# Patient Record
Sex: Male | Born: 1935 | Race: White | Hispanic: No | Smoking: Former smoker
Health system: Southern US, Community
[De-identification: ages and names within clinical notes are randomized; demographics above are authoritative.]

## PROBLEM LIST (undated history)

## (undated) DIAGNOSIS — K7589 Other specified inflammatory liver diseases: Secondary | ICD-10-CM

## (undated) DIAGNOSIS — J449 Chronic obstructive pulmonary disease, unspecified: Secondary | ICD-10-CM

## (undated) DIAGNOSIS — Z860100 Personal history of colon polyps, unspecified: Secondary | ICD-10-CM

## (undated) DIAGNOSIS — E119 Type 2 diabetes mellitus without complications: Secondary | ICD-10-CM

## (undated) DIAGNOSIS — E785 Hyperlipidemia, unspecified: Secondary | ICD-10-CM

## (undated) DIAGNOSIS — I10 Essential (primary) hypertension: Secondary | ICD-10-CM

## (undated) DIAGNOSIS — R55 Syncope and collapse: Secondary | ICD-10-CM

## (undated) DIAGNOSIS — N529 Male erectile dysfunction, unspecified: Secondary | ICD-10-CM

## (undated) DIAGNOSIS — C61 Malignant neoplasm of prostate: Secondary | ICD-10-CM

## (undated) DIAGNOSIS — N2889 Other specified disorders of kidney and ureter: Secondary | ICD-10-CM

## (undated) HISTORY — DX: Other specified disorders of kidney and ureter: N28.89

## (undated) HISTORY — DX: Malignant neoplasm of prostate: C61

## (undated) HISTORY — DX: Other specified inflammatory liver diseases: K75.89

## (undated) HISTORY — DX: Chronic obstructive pulmonary disease, unspecified: J44.9

## (undated) HISTORY — DX: Male erectile dysfunction, unspecified: N52.9

## (undated) HISTORY — DX: Hyperlipidemia, unspecified: E78.5

## (undated) HISTORY — PX: CATARACT EXTRACTION: SUR2

## (undated) HISTORY — DX: Syncope and collapse: R55

---

## 2000-04-10 ENCOUNTER — Emergency Department (HOSPITAL_COMMUNITY): Admission: EM | Admit: 2000-04-10 | Discharge: 2000-04-10 | Payer: Self-pay | Admitting: Emergency Medicine

## 2003-08-02 ENCOUNTER — Emergency Department (HOSPITAL_COMMUNITY): Admission: EM | Admit: 2003-08-02 | Discharge: 2003-08-03 | Payer: Self-pay

## 2003-08-02 ENCOUNTER — Encounter: Payer: Self-pay | Admitting: Emergency Medicine

## 2003-08-10 ENCOUNTER — Encounter: Payer: Self-pay | Admitting: Internal Medicine

## 2003-08-10 ENCOUNTER — Encounter: Admission: RE | Admit: 2003-08-10 | Discharge: 2003-08-10 | Payer: Self-pay | Admitting: Internal Medicine

## 2004-10-21 ENCOUNTER — Emergency Department (HOSPITAL_COMMUNITY): Admission: EM | Admit: 2004-10-21 | Discharge: 2004-10-21 | Payer: Self-pay | Admitting: Emergency Medicine

## 2005-09-26 ENCOUNTER — Emergency Department (HOSPITAL_COMMUNITY): Admission: EM | Admit: 2005-09-26 | Discharge: 2005-09-26 | Payer: Self-pay | Admitting: Emergency Medicine

## 2006-01-21 LAB — HM COLONOSCOPY: HM Colonoscopy: NORMAL

## 2006-05-11 ENCOUNTER — Emergency Department (HOSPITAL_COMMUNITY): Admission: EM | Admit: 2006-05-11 | Discharge: 2006-05-11 | Payer: Self-pay | Admitting: Emergency Medicine

## 2006-05-14 ENCOUNTER — Ambulatory Visit: Payer: Self-pay | Admitting: Internal Medicine

## 2006-07-15 ENCOUNTER — Ambulatory Visit: Payer: Self-pay | Admitting: Internal Medicine

## 2006-08-12 ENCOUNTER — Ambulatory Visit: Payer: Self-pay | Admitting: Internal Medicine

## 2006-08-18 ENCOUNTER — Emergency Department (HOSPITAL_COMMUNITY): Admission: EM | Admit: 2006-08-18 | Discharge: 2006-08-18 | Payer: Self-pay | Admitting: Emergency Medicine

## 2006-09-01 ENCOUNTER — Emergency Department (HOSPITAL_COMMUNITY): Admission: EM | Admit: 2006-09-01 | Discharge: 2006-09-01 | Payer: Self-pay | Admitting: Emergency Medicine

## 2006-09-07 ENCOUNTER — Ambulatory Visit: Payer: Self-pay | Admitting: Internal Medicine

## 2006-09-10 ENCOUNTER — Emergency Department (HOSPITAL_COMMUNITY): Admission: EM | Admit: 2006-09-10 | Discharge: 2006-09-11 | Payer: Self-pay | Admitting: Emergency Medicine

## 2006-09-14 ENCOUNTER — Ambulatory Visit: Payer: Self-pay | Admitting: Internal Medicine

## 2006-10-05 ENCOUNTER — Encounter: Payer: Self-pay | Admitting: Emergency Medicine

## 2006-10-07 ENCOUNTER — Ambulatory Visit: Payer: Self-pay | Admitting: Internal Medicine

## 2006-10-07 ENCOUNTER — Inpatient Hospital Stay (HOSPITAL_COMMUNITY): Admission: EM | Admit: 2006-10-07 | Discharge: 2006-10-08 | Payer: Self-pay | Admitting: Internal Medicine

## 2006-10-13 ENCOUNTER — Ambulatory Visit: Payer: Self-pay | Admitting: Internal Medicine

## 2006-11-24 DIAGNOSIS — C61 Malignant neoplasm of prostate: Secondary | ICD-10-CM

## 2006-11-24 HISTORY — DX: Malignant neoplasm of prostate: C61

## 2006-12-11 ENCOUNTER — Ambulatory Visit: Payer: Self-pay | Admitting: Internal Medicine

## 2006-12-17 ENCOUNTER — Ambulatory Visit: Payer: Self-pay | Admitting: Internal Medicine

## 2006-12-17 DIAGNOSIS — I10 Essential (primary) hypertension: Secondary | ICD-10-CM

## 2006-12-17 DIAGNOSIS — E785 Hyperlipidemia, unspecified: Secondary | ICD-10-CM

## 2006-12-29 ENCOUNTER — Ambulatory Visit: Payer: Self-pay | Admitting: Internal Medicine

## 2007-01-12 ENCOUNTER — Ambulatory Visit: Payer: Self-pay | Admitting: Internal Medicine

## 2007-01-27 ENCOUNTER — Ambulatory Visit: Payer: Self-pay | Admitting: Internal Medicine

## 2007-01-27 LAB — CONVERTED CEMR LAB
BUN: 9 mg/dL (ref 6–23)
CO2: 29 meq/L (ref 19–32)
Calcium: 9.1 mg/dL (ref 8.4–10.5)
Chloride: 106 meq/L (ref 96–112)
Creatinine, Ser: 0.6 mg/dL (ref 0.4–1.5)
GFR calc Af Amer: 170 mL/min
GFR calc non Af Amer: 141 mL/min
Glucose, Bld: 158 mg/dL — ABNORMAL HIGH (ref 70–99)
Potassium: 4 meq/L (ref 3.5–5.1)
Sodium: 142 meq/L (ref 135–145)

## 2007-03-29 ENCOUNTER — Encounter: Payer: Self-pay | Admitting: Internal Medicine

## 2007-03-29 ENCOUNTER — Ambulatory Visit: Payer: Self-pay | Admitting: Internal Medicine

## 2007-03-29 LAB — CONVERTED CEMR LAB
Bilirubin Urine: NEGATIVE
Hemoglobin, Urine: NEGATIVE
Ketones, ur: NEGATIVE mg/dL
Leukocytes, UA: NEGATIVE
Nitrite: NEGATIVE
PSA: 7.13 ng/mL — ABNORMAL HIGH (ref 0.10–4.00)
Protein, ur: NEGATIVE mg/dL
RBC / HPF: NONE SEEN (ref <3)
Specific Gravity, Urine: 1.01 (ref 1.005–1.03)
Urine Glucose: NEGATIVE mg/dL
Urobilinogen, UA: 0.2 (ref 0.0–1.0)
WBC, UA: NONE SEEN cells/hpf (ref <3)
pH: 7 (ref 5.0–8.0)

## 2007-04-21 ENCOUNTER — Emergency Department (HOSPITAL_COMMUNITY): Admission: EM | Admit: 2007-04-21 | Discharge: 2007-04-21 | Payer: Self-pay | Admitting: *Deleted

## 2007-04-26 ENCOUNTER — Ambulatory Visit: Payer: Self-pay | Admitting: Internal Medicine

## 2007-04-27 ENCOUNTER — Encounter: Payer: Self-pay | Admitting: Internal Medicine

## 2007-04-28 LAB — CONVERTED CEMR LAB
Hemoglobin, Urine: NEGATIVE
Ketones, ur: NEGATIVE mg/dL
Leukocytes, UA: NEGATIVE
Nitrite: NEGATIVE
Protein, ur: NEGATIVE mg/dL
RBC / HPF: NONE SEEN (ref <3)
Specific Gravity, Urine: 1.027 (ref 1.005–1.03)
Urine Glucose: NEGATIVE mg/dL
Urobilinogen, UA: 1 (ref 0.0–1.0)
WBC, UA: NONE SEEN cells/hpf (ref <3)
pH: 6 (ref 5.0–8.0)

## 2007-05-03 LAB — CONVERTED CEMR LAB: PSA: 6.4 ng/mL — ABNORMAL HIGH (ref 0.10–4.00)

## 2007-05-19 ENCOUNTER — Ambulatory Visit: Payer: Self-pay | Admitting: Internal Medicine

## 2007-05-20 ENCOUNTER — Encounter: Payer: Self-pay | Admitting: Internal Medicine

## 2007-05-20 LAB — CONVERTED CEMR LAB
Bilirubin Urine: NEGATIVE
Hemoglobin, Urine: NEGATIVE
Ketones, ur: NEGATIVE mg/dL
Leukocytes, UA: NEGATIVE
Nitrite: NEGATIVE
Protein, ur: NEGATIVE mg/dL
Specific Gravity, Urine: 1.025 (ref 1.005–1.03)
Urine Glucose: NEGATIVE mg/dL
Urobilinogen, UA: 0.2 (ref 0.0–1.0)
pH: 5.5 (ref 5.0–8.0)

## 2007-05-24 ENCOUNTER — Encounter (INDEPENDENT_AMBULATORY_CARE_PROVIDER_SITE_OTHER): Payer: Self-pay | Admitting: *Deleted

## 2007-08-04 ENCOUNTER — Ambulatory Visit: Payer: Self-pay | Admitting: Internal Medicine

## 2007-08-04 LAB — CONVERTED CEMR LAB
Bilirubin Urine: NEGATIVE
Glucose, Urine, Semiquant: NEGATIVE
Protein, U semiquant: NEGATIVE
Specific Gravity, Urine: 1.01
pH: 6.5

## 2007-08-05 LAB — CONVERTED CEMR LAB
ALT: 18 units/L (ref 0–53)
AST: 20 units/L (ref 0–37)
BUN: 13 mg/dL (ref 6–23)
CO2: 30 meq/L (ref 19–32)
Calcium: 9.3 mg/dL (ref 8.4–10.5)
Chloride: 109 meq/L (ref 96–112)
Cholesterol: 146 mg/dL (ref 0–200)
Creatinine, Ser: 1 mg/dL (ref 0.4–1.5)
GFR calc Af Amer: 94 mL/min
GFR calc non Af Amer: 78 mL/min
Glucose, Bld: 94 mg/dL (ref 70–99)
HDL: 34.1 mg/dL — ABNORMAL LOW (ref >39.0)
Hemoglobin: 14.7 g/dL (ref 13.0–17.0)
LDL Cholesterol: 87 mg/dL (ref 0–99)
PSA: 6.28 ng/mL — ABNORMAL HIGH (ref 0.10–4.00)
Potassium: 4.5 meq/L (ref 3.5–5.1)
Sodium: 145 meq/L (ref 135–145)
TSH: 0.55 microintl units/mL (ref 0.35–5.50)
Total CHOL/HDL Ratio: 4.3
Triglycerides: 125 mg/dL (ref 0–149)
VLDL: 25 mg/dL (ref 0–40)

## 2007-08-26 ENCOUNTER — Encounter: Payer: Self-pay | Admitting: Internal Medicine

## 2007-09-04 ENCOUNTER — Encounter: Payer: Self-pay | Admitting: Internal Medicine

## 2007-09-04 ENCOUNTER — Inpatient Hospital Stay (HOSPITAL_COMMUNITY): Admission: EM | Admit: 2007-09-04 | Discharge: 2007-09-07 | Payer: Self-pay | Admitting: Emergency Medicine

## 2007-09-04 ENCOUNTER — Ambulatory Visit: Payer: Self-pay | Admitting: Internal Medicine

## 2007-09-17 ENCOUNTER — Ambulatory Visit: Payer: Self-pay | Admitting: Internal Medicine

## 2007-09-17 DIAGNOSIS — F528 Other sexual dysfunction not due to a substance or known physiological condition: Secondary | ICD-10-CM

## 2007-09-18 ENCOUNTER — Encounter (INDEPENDENT_AMBULATORY_CARE_PROVIDER_SITE_OTHER): Payer: Self-pay | Admitting: *Deleted

## 2007-09-18 LAB — CONVERTED CEMR LAB
BUN: 10 mg/dL (ref 6–23)
CO2: 28 meq/L (ref 19–32)
Calcium: 8.9 mg/dL (ref 8.4–10.5)
Chloride: 106 meq/L (ref 96–112)
Creatinine, Ser: 1 mg/dL (ref 0.4–1.5)
GFR calc Af Amer: 94 mL/min
GFR calc non Af Amer: 78 mL/min
Glucose, Bld: 108 mg/dL — ABNORMAL HIGH (ref 70–99)
Potassium: 4.1 meq/L (ref 3.5–5.1)
Sodium: 138 meq/L (ref 135–145)

## 2007-09-23 ENCOUNTER — Ambulatory Visit: Payer: Self-pay | Admitting: Endocrinology

## 2007-09-23 ENCOUNTER — Inpatient Hospital Stay (HOSPITAL_COMMUNITY): Admission: EM | Admit: 2007-09-23 | Discharge: 2007-09-26 | Payer: Self-pay | Admitting: Emergency Medicine

## 2007-10-02 ENCOUNTER — Inpatient Hospital Stay (HOSPITAL_COMMUNITY): Admission: EM | Admit: 2007-10-02 | Discharge: 2007-10-04 | Payer: Self-pay | Admitting: Emergency Medicine

## 2007-10-12 ENCOUNTER — Ambulatory Visit: Payer: Self-pay | Admitting: Internal Medicine

## 2007-10-13 ENCOUNTER — Encounter: Payer: Self-pay | Admitting: Internal Medicine

## 2007-10-14 ENCOUNTER — Encounter: Payer: Self-pay | Admitting: Internal Medicine

## 2007-10-19 ENCOUNTER — Encounter: Payer: Self-pay | Admitting: Internal Medicine

## 2007-10-20 ENCOUNTER — Ambulatory Visit: Payer: Self-pay | Admitting: Internal Medicine

## 2007-10-27 ENCOUNTER — Ambulatory Visit: Payer: Self-pay | Admitting: Pulmonary Disease

## 2007-10-29 ENCOUNTER — Ambulatory Visit: Admission: RE | Admit: 2007-10-29 | Discharge: 2007-11-24 | Payer: Self-pay | Admitting: Radiation Oncology

## 2007-11-01 ENCOUNTER — Encounter: Payer: Self-pay | Admitting: Internal Medicine

## 2007-11-02 ENCOUNTER — Telehealth (INDEPENDENT_AMBULATORY_CARE_PROVIDER_SITE_OTHER): Payer: Self-pay | Admitting: *Deleted

## 2007-11-02 ENCOUNTER — Encounter: Payer: Self-pay | Admitting: Internal Medicine

## 2007-11-02 DIAGNOSIS — C61 Malignant neoplasm of prostate: Secondary | ICD-10-CM

## 2007-11-25 ENCOUNTER — Ambulatory Visit: Admission: RE | Admit: 2007-11-25 | Discharge: 2008-02-21 | Payer: Self-pay | Admitting: Radiation Oncology

## 2007-12-08 ENCOUNTER — Ambulatory Visit: Payer: Self-pay | Admitting: Pulmonary Disease

## 2007-12-08 DIAGNOSIS — J449 Chronic obstructive pulmonary disease, unspecified: Secondary | ICD-10-CM | POA: Insufficient documentation

## 2007-12-20 ENCOUNTER — Ambulatory Visit: Payer: Self-pay | Admitting: Internal Medicine

## 2008-02-03 ENCOUNTER — Ambulatory Visit: Payer: Self-pay | Admitting: Internal Medicine

## 2008-02-10 ENCOUNTER — Ambulatory Visit: Payer: Self-pay | Admitting: Internal Medicine

## 2008-02-16 ENCOUNTER — Emergency Department (HOSPITAL_COMMUNITY): Admission: EM | Admit: 2008-02-16 | Discharge: 2008-02-16 | Payer: Self-pay | Admitting: Emergency Medicine

## 2008-02-21 ENCOUNTER — Ambulatory Visit: Admission: RE | Admit: 2008-02-21 | Discharge: 2008-03-09 | Payer: Self-pay | Admitting: Radiation Oncology

## 2008-02-28 ENCOUNTER — Encounter: Payer: Self-pay | Admitting: Internal Medicine

## 2008-02-29 ENCOUNTER — Encounter (INDEPENDENT_AMBULATORY_CARE_PROVIDER_SITE_OTHER): Payer: Self-pay | Admitting: *Deleted

## 2008-03-02 ENCOUNTER — Ambulatory Visit: Payer: Self-pay | Admitting: Pulmonary Disease

## 2008-03-03 ENCOUNTER — Encounter: Payer: Self-pay | Admitting: Internal Medicine

## 2008-04-19 ENCOUNTER — Encounter: Payer: Self-pay | Admitting: Internal Medicine

## 2008-05-08 ENCOUNTER — Ambulatory Visit: Payer: Self-pay | Admitting: Internal Medicine

## 2008-05-09 LAB — CONVERTED CEMR LAB
ALT: 15 units/L (ref 0–53)
AST: 14 units/L (ref 0–37)
BUN: 11 mg/dL (ref 6–23)
Basophils Absolute: 0.1 10*3/uL (ref 0.0–0.1)
Basophils Relative: 0.6 % (ref 0.0–1.0)
CO2: 28 meq/L (ref 19–32)
Calcium: 9.1 mg/dL (ref 8.4–10.5)
Chloride: 105 meq/L (ref 96–112)
Creatinine, Ser: 1.1 mg/dL (ref 0.4–1.5)
Eosinophils Absolute: 0.8 10*3/uL — ABNORMAL HIGH (ref 0.0–0.7)
Eosinophils Relative: 9.9 % — ABNORMAL HIGH (ref 0.0–5.0)
GFR calc Af Amer: 84 mL/min
GFR calc non Af Amer: 70 mL/min
Glucose, Bld: 135 mg/dL — ABNORMAL HIGH (ref 70–99)
HCT: 37.9 % — ABNORMAL LOW (ref 39.0–52.0)
Hemoglobin: 13.3 g/dL (ref 13.0–17.0)
Lymphocytes Relative: 16 % (ref 12.0–46.0)
MCHC: 35.1 g/dL (ref 30.0–36.0)
MCV: 94.1 fL (ref 78.0–100.0)
Monocytes Absolute: 0.8 10*3/uL (ref 0.1–1.0)
Monocytes Relative: 10 % (ref 3.0–12.0)
Neutro Abs: 5.4 10*3/uL (ref 1.4–7.7)
Neutrophils Relative %: 63.5 % (ref 43.0–77.0)
Platelets: 219 10*3/uL (ref 150–400)
Potassium: 4.6 meq/L (ref 3.5–5.1)
RBC: 4.03 M/uL — ABNORMAL LOW (ref 4.22–5.81)
RDW: 13.1 % (ref 11.5–14.6)
Sodium: 140 meq/L (ref 135–145)
WBC: 8.4 10*3/uL (ref 4.5–10.5)

## 2008-05-11 ENCOUNTER — Telehealth (INDEPENDENT_AMBULATORY_CARE_PROVIDER_SITE_OTHER): Payer: Self-pay | Admitting: *Deleted

## 2008-06-07 ENCOUNTER — Ambulatory Visit: Payer: Self-pay | Admitting: Pulmonary Disease

## 2008-09-06 ENCOUNTER — Ambulatory Visit: Payer: Self-pay | Admitting: Internal Medicine

## 2008-09-07 ENCOUNTER — Telehealth: Payer: Self-pay | Admitting: Internal Medicine

## 2008-09-08 LAB — CONVERTED CEMR LAB
Total CHOL/HDL Ratio: 4.3
Triglycerides: 143 mg/dL (ref 0–149)

## 2008-10-06 ENCOUNTER — Encounter: Payer: Self-pay | Admitting: Internal Medicine

## 2008-10-06 ENCOUNTER — Telehealth (INDEPENDENT_AMBULATORY_CARE_PROVIDER_SITE_OTHER): Payer: Self-pay | Admitting: *Deleted

## 2008-10-25 ENCOUNTER — Encounter: Payer: Self-pay | Admitting: Internal Medicine

## 2008-11-23 ENCOUNTER — Encounter: Payer: Self-pay | Admitting: Internal Medicine

## 2008-11-24 DIAGNOSIS — R55 Syncope and collapse: Secondary | ICD-10-CM

## 2008-11-24 HISTORY — DX: Syncope and collapse: R55

## 2008-12-04 ENCOUNTER — Ambulatory Visit: Payer: Self-pay | Admitting: Pulmonary Disease

## 2009-02-28 ENCOUNTER — Ambulatory Visit: Payer: Self-pay | Admitting: Internal Medicine

## 2009-03-05 LAB — CONVERTED CEMR LAB
ALT: 17 units/L (ref 0–53)
AST: 18 units/L (ref 0–37)
CO2: 29 meq/L (ref 19–32)
Chloride: 108 meq/L (ref 96–112)
Potassium: 4.2 meq/L (ref 3.5–5.1)
Sodium: 143 meq/L (ref 135–145)

## 2009-05-03 ENCOUNTER — Encounter: Payer: Self-pay | Admitting: Internal Medicine

## 2009-05-23 ENCOUNTER — Encounter: Payer: Self-pay | Admitting: Internal Medicine

## 2009-05-31 ENCOUNTER — Ambulatory Visit: Payer: Self-pay | Admitting: Pulmonary Disease

## 2009-06-26 ENCOUNTER — Encounter: Payer: Self-pay | Admitting: Internal Medicine

## 2009-07-04 ENCOUNTER — Ambulatory Visit: Payer: Self-pay | Admitting: Internal Medicine

## 2009-07-13 ENCOUNTER — Ambulatory Visit: Payer: Self-pay | Admitting: Internal Medicine

## 2009-07-13 DIAGNOSIS — R55 Syncope and collapse: Secondary | ICD-10-CM | POA: Insufficient documentation

## 2009-07-16 ENCOUNTER — Encounter (INDEPENDENT_AMBULATORY_CARE_PROVIDER_SITE_OTHER): Payer: Self-pay | Admitting: *Deleted

## 2009-07-16 ENCOUNTER — Encounter: Payer: Self-pay | Admitting: Internal Medicine

## 2009-08-09 ENCOUNTER — Ambulatory Visit: Payer: Self-pay | Admitting: Internal Medicine

## 2009-08-13 ENCOUNTER — Ambulatory Visit: Payer: Self-pay

## 2009-08-13 ENCOUNTER — Encounter: Payer: Self-pay | Admitting: Internal Medicine

## 2009-09-20 ENCOUNTER — Encounter: Payer: Self-pay | Admitting: Internal Medicine

## 2009-09-26 ENCOUNTER — Ambulatory Visit: Payer: Self-pay | Admitting: Internal Medicine

## 2009-10-15 ENCOUNTER — Ambulatory Visit: Payer: Self-pay | Admitting: Internal Medicine

## 2009-10-17 ENCOUNTER — Ambulatory Visit: Payer: Self-pay | Admitting: Internal Medicine

## 2009-10-23 LAB — CONVERTED CEMR LAB
ALT: 15 units/L (ref 0–53)
CO2: 29 meq/L (ref 19–32)
Calcium: 9 mg/dL (ref 8.4–10.5)
Chloride: 106 meq/L (ref 96–112)
Creatinine, Ser: 1.1 mg/dL (ref 0.4–1.5)
Glucose, Bld: 147 mg/dL — ABNORMAL HIGH (ref 70–99)
HDL: 42.8 mg/dL (ref >39.00)
LDL Cholesterol: 94 mg/dL (ref 0–99)
TSH: 0.81 microintl units/mL (ref 0.35–5.50)
Total CHOL/HDL Ratio: 4
Triglycerides: 133 mg/dL (ref 0.0–149.0)

## 2009-10-24 DIAGNOSIS — E119 Type 2 diabetes mellitus without complications: Secondary | ICD-10-CM

## 2009-10-24 HISTORY — DX: Type 2 diabetes mellitus without complications: E11.9

## 2009-10-25 ENCOUNTER — Ambulatory Visit: Payer: Self-pay | Admitting: Internal Medicine

## 2009-10-30 DIAGNOSIS — E119 Type 2 diabetes mellitus without complications: Secondary | ICD-10-CM | POA: Insufficient documentation

## 2009-10-30 LAB — CONVERTED CEMR LAB: Hgb A1c MFr Bld: 6.8 % — ABNORMAL HIGH (ref 4.6–6.5)

## 2009-10-31 ENCOUNTER — Encounter: Payer: Self-pay | Admitting: Internal Medicine

## 2009-12-10 ENCOUNTER — Ambulatory Visit: Payer: Self-pay | Admitting: Pulmonary Disease

## 2009-12-12 ENCOUNTER — Encounter: Admission: RE | Admit: 2009-12-12 | Discharge: 2010-03-12 | Payer: Self-pay | Admitting: Internal Medicine

## 2009-12-12 ENCOUNTER — Encounter: Payer: Self-pay | Admitting: Internal Medicine

## 2010-01-21 ENCOUNTER — Ambulatory Visit: Payer: Self-pay | Admitting: Internal Medicine

## 2010-01-24 LAB — CONVERTED CEMR LAB
Creatinine,U: 197.5 mg/dL
Microalb Creat Ratio: 10.1 mg/g (ref 0.0–30.0)

## 2010-02-06 ENCOUNTER — Ambulatory Visit: Payer: Self-pay | Admitting: Internal Medicine

## 2010-04-15 ENCOUNTER — Ambulatory Visit: Payer: Self-pay | Admitting: Internal Medicine

## 2010-04-17 LAB — CONVERTED CEMR LAB
ALT: 19 units/L (ref 0–53)
AST: 20 units/L (ref 0–37)
CO2: 30 meq/L (ref 19–32)
Calcium: 9.3 mg/dL (ref 8.4–10.5)
Creatinine, Ser: 1.1 mg/dL (ref 0.4–1.5)
GFR calc non Af Amer: 70.7 mL/min (ref >60)
Sodium: 140 meq/L (ref 135–145)

## 2010-04-25 ENCOUNTER — Encounter: Payer: Self-pay | Admitting: Internal Medicine

## 2010-05-01 ENCOUNTER — Encounter: Payer: Self-pay | Admitting: Internal Medicine

## 2010-05-30 ENCOUNTER — Encounter: Payer: Self-pay | Admitting: Internal Medicine

## 2010-06-26 ENCOUNTER — Ambulatory Visit: Payer: Self-pay | Admitting: Internal Medicine

## 2010-06-26 DIAGNOSIS — R930 Abnormal findings on diagnostic imaging of skull and head, not elsewhere classified: Secondary | ICD-10-CM | POA: Insufficient documentation

## 2010-06-26 LAB — CONVERTED CEMR LAB
Chloride: 99 meq/L (ref 96–112)
GFR calc non Af Amer: 65.73 mL/min (ref >60)
Potassium: 4.1 meq/L (ref 3.5–5.1)
Sodium: 140 meq/L (ref 135–145)

## 2010-06-27 ENCOUNTER — Ambulatory Visit: Payer: Self-pay | Admitting: Cardiovascular Disease

## 2010-07-05 ENCOUNTER — Ambulatory Visit: Payer: Self-pay | Admitting: Pulmonary Disease

## 2010-07-19 ENCOUNTER — Ambulatory Visit: Payer: Self-pay | Admitting: Pulmonary Disease

## 2010-08-23 ENCOUNTER — Encounter: Payer: Self-pay | Admitting: Internal Medicine

## 2010-10-14 ENCOUNTER — Encounter: Payer: Self-pay | Admitting: Pulmonary Disease

## 2010-10-16 ENCOUNTER — Ambulatory Visit: Payer: Self-pay | Admitting: Pulmonary Disease

## 2010-10-21 ENCOUNTER — Ambulatory Visit: Payer: Self-pay | Admitting: Internal Medicine

## 2010-10-21 DIAGNOSIS — R21 Rash and other nonspecific skin eruption: Secondary | ICD-10-CM

## 2010-10-21 LAB — HM DIABETES FOOT EXAM

## 2010-10-24 LAB — CONVERTED CEMR LAB
ALT: 17 units/L (ref 0–53)
Basophils Relative: 0.6 % (ref 0.0–3.0)
Eosinophils Relative: 5.1 % — ABNORMAL HIGH (ref 0.0–5.0)
HCT: 44.1 % (ref 39.0–52.0)
HDL: 40.9 mg/dL (ref >39.00)
Hemoglobin: 15.3 g/dL (ref 13.0–17.0)
Hgb A1c MFr Bld: 6.5 % (ref 4.6–6.5)
LDL Cholesterol: 95 mg/dL (ref 0–99)
Lymphs Abs: 1.6 10*3/uL (ref 0.7–4.0)
MCV: 92.2 fL (ref 78.0–100.0)
Monocytes Absolute: 0.6 10*3/uL (ref 0.1–1.0)
Monocytes Relative: 7.2 % (ref 3.0–12.0)
Neutro Abs: 5.7 10*3/uL (ref 1.4–7.7)
Platelets: 202 10*3/uL (ref 150.0–400.0)
RBC: 4.78 M/uL (ref 4.22–5.81)
VLDL: 13.2 mg/dL (ref 0.0–40.0)
WBC: 8.3 10*3/uL (ref 4.5–10.5)

## 2010-10-31 ENCOUNTER — Encounter: Payer: Self-pay | Admitting: Internal Medicine

## 2010-12-22 LAB — CONVERTED CEMR LAB
Eosinophils Relative: 4.3 % (ref 0.0–5.0)
HCT: 42.6 % (ref 39.0–52.0)
Hemoglobin: 14.6 g/dL (ref 13.0–17.0)
Lymphocytes Relative: 20.9 % (ref 12.0–46.0)
Lymphs Abs: 2.1 10*3/uL (ref 0.7–4.0)
Monocytes Relative: 8.3 % (ref 3.0–12.0)
Neutro Abs: 6.8 10*3/uL (ref 1.4–7.7)
RBC: 4.48 M/uL (ref 4.22–5.81)
WBC: 10.2 10*3/uL (ref 4.5–10.5)

## 2010-12-23 ENCOUNTER — Ambulatory Visit
Admission: RE | Admit: 2010-12-23 | Discharge: 2010-12-23 | Payer: Self-pay | Source: Home / Self Care | Attending: Pulmonary Disease | Admitting: Pulmonary Disease

## 2010-12-24 NOTE — Consult Note (Signed)
Summary: [facility]  [facility]   Imported By: Lanelle Bal 12/31/2009 13:42:00  _____________________________________________________________________  External Attachment:    Type:   Image     Comment:   External Document

## 2010-12-24 NOTE — Miscellaneous (Signed)
Summary: Orders Update  Clinical Lists Changes  Orders: Added new Test order of T-2 View CXR (71020TC) - Signed 

## 2010-12-24 NOTE — Assessment & Plan Note (Signed)
Summary: rto 6 months/cbs   Vital Signs:  Patient profile:   77 year old male Weight:      169 pounds Pulse rate:   85 / minute Pulse rhythm:   regular BP sitting:   126 / 82  (left arm) Cuff size:   regular  Vitals Entered By: Army Fossa CMA (October 21, 2010 8:47 AM) CC: 6 month f/u- fasting  Comments Thinks the Lasix is giving him blurred vision.  [facility] [address]    History of Present Illness: 6 month f/u- fasting   AODM  dx  10-2009--- no ambulatory CBGs     Abn cxr June 26, 2010--Notes reviewed, improved with antibiotics, due for another chest x-ray  ~ 12-2010  Hyperlipidemia-- good medication compliance   Hypertension-- ambulatory BPs  ~ 1/week, usually wnl   blurred vision x 6 months, mostly in bright light, due to Lasix? s/p cataract surgery B saw his ophthalmologist around 6 months ago, told he was okay ROS--denies diplopia, amaurosis fugax  prostate ca --note from urology reviewed, he was seen 04/2010, was doing ok after radiation therapy   rash  for "a long time", L>R pretibial area, some itching, no worse in the winter. Uses over-the-counter calamine  Current Medications (verified): 1)  Mucinex 600 Mg Tb12 (Guaifenesin) .... Take 1 Tablet By Mouth Two Times A Day 2)  Combivent 103-18 Mcg/act  Aero (Ipratropium-Albuterol) .... Prn 3)  Albuterol Sulfate (2.5 Mg/4ml) 0.083%  Nebu (Albuterol Sulfate) .... Use 1 Vial Qid As Needed 4)  Symbicort 160-4.5 Mcg/act  Aero (Budesonide-Formoterol Fumarate) .... Two Puffs Twice Daily 5)  Portable 02 .... Dx Emphysema 6)  Lasix 20 Mg Tabs (Furosemide) .... Take 2 Tabs By Mouth Once Daily 7)  Benazepril Hcl 40 Mg  Tabs (Benazepril Hcl) .... Take 1 Tablet By Mouth Once A Day 8)  Lipitor 20 Mg Tabs (Atorvastatin Calcium) .... Take 1 Tablet By Mouth Once A Day 9)  Bayer Low Strength 81 Mg  Tbec (Aspirin) .... Take 1 Tablet By Mouth Once A Day 10)  Preservision Areds  Caps (Multiple Vitamins-Minerals)  Allergies  (verified): 1)  ! Norvasc 2)  ! * Advair  Past History:  Past Medical History: AODM  dx  10-2009 COPD--home 02 as needed     - HFA 25% after coaching June 26, 2010  Abn cxr June 26, 2010     - CT Chest ordered June 26, 2010 >>> Syncope-probably neurally mediated Hyperlipidemia Hypertension prostate ca (Dx 2008) ED  Social History: Reviewed history from 10/15/2009 and no changes required. Single, lives w/ son two children Retired independent on ADL, does yard work Patient states former smoker. quit 2007. ETOH-- rarely  Review of Systems CV:  Denies chest pain or discomfort, palpitations, and swelling of feet. Resp:  Denies cough and coughing up blood; SOB at baseline  slightly  wheezy , at baseline . GI:  Denies diarrhea, nausea, and vomiting.  Physical Exam  General:  alert, well-developed, and well-nourished.   Eyes:  EOMI anterior chamfers clear Pupils reactive to light Lungs:  normal respiratory effort, no intercostal retractions, and no accessory muscle use.  decreased breath sounds throughout Heart:  normal rate, regular rhythm, no murmur, and no gallop. distant  heart sounds Pulses:  normal pedal pulses bilaterally  Extremities:  no pretibial edema bilaterally  Skin:  pretibial area, left, has a patch of irregular borders, skin is a slightly red- scaly. similar but smaller changes on the right  pretibial area  Diabetes Management Exam:    Foot Exam (with socks and/or shoes not present):       Sensory-Pinprick/Light touch:          Left medial foot (L-4): normal          Left dorsal foot (L-5): normal          Left lateral foot (S-1): normal          Right medial foot (L-4): normal          Right dorsal foot (L-5): normal          Right lateral foot (S-1): normal       Sensory-Monofilament:          Left foot: normal          Right foot: normal       Inspection:          Left foot: normal          Right foot: normal       Nails:          Left  foot: thickened          Right foot: thickened    Impression & Recommendations:  Problem # 1:  DM (ICD-250.00)  His updated medication list for this problem includes:    Benazepril Hcl 40 Mg Tabs (Benazepril hcl) .Marland Kitchen... Take 1 tablet by mouth once a day    Bayer Low Strength 81 Mg Tbec (Aspirin) .Marland Kitchen... Take 1 tablet by mouth once a day  Orders: TLB-A1C / Hgb A1C (Glycohemoglobin) (83036-A1C) Specimen Handling (24401)  Labs Reviewed: Creat: 1.2 (06/26/2010)    Reviewed HgBA1c results: 6.3 (04/15/2010)  6.4 (01/21/2010)  Problem # 2:  HYPERTENSION (ICD-401.9) at goal  His updated medication list for this problem includes:    Lasix 20 Mg Tabs (Furosemide) .Marland Kitchen... Take 2 tabs by mouth once daily    Benazepril Hcl 40 Mg Tabs (Benazepril hcl) .Marland Kitchen... Take 1 tablet by mouth once a day  Orders: TLB-CBC Platelet - w/Differential (85025-CBCD) Specimen Handling (02725)  BP today: 126/82 Prior BP: 146/82 (07/05/2010)  Labs Reviewed: K+: 4.1 (06/26/2010) Creat: : 1.2 (06/26/2010)   Chol: 163 (10/17/2009)   HDL: 42.80 (10/17/2009)   LDL: 94 (10/17/2009)   TG: 133.0 (10/17/2009)  Problem # 3:  HYPERLIPIDEMIA (ICD-272.4) labs  His updated medication list for this problem includes:    Lipitor 20 Mg Tabs (Atorvastatin calcium) .Marland Kitchen... Take 1 tablet by mouth once a day  Orders: Venipuncture (36644) TLB-Lipid Panel (80061-LIPID) TLB-ALT (SGPT) (84460-ALT) TLB-AST (SGOT) (84450-SGOT) Specimen Handling (03474)  Problem # 4:  RASH-NONVESICULAR (ICD-782.1) Assessment: New  eczema? Prescribed hydrocortisone  His updated medication list for this problem includes:    Hydrocortisone 2.5 % Crea (Hydrocortisone) .Marland Kitchen... Applied twice a day for 10 days  Problem # 5:  blurred vision unclear etiology, checking a hemoglobin A1c today Recommend to see ophthalmology again if symptoms persist  Complete Medication List: 1)  Mucinex 600 Mg Tb12 (Guaifenesin) .... Take 1 tablet by mouth two times a  day 2)  Combivent 103-18 Mcg/act Aero (Ipratropium-albuterol) .... Prn 3)  Albuterol Sulfate (2.5 Mg/69ml) 0.083% Nebu (Albuterol sulfate) .... Use 1 vial qid as needed 4)  Symbicort 160-4.5 Mcg/act Aero (Budesonide-formoterol fumarate) .... Two puffs twice daily 5)  Portable 02  .... Dx emphysema 6)  Lasix 20 Mg Tabs (Furosemide) .... Take 2 tabs by mouth once daily 7)  Benazepril Hcl 40 Mg Tabs (Benazepril hcl) .... Take 1 tablet  by mouth once a day 8)  Lipitor 20 Mg Tabs (Atorvastatin calcium) .... Take 1 tablet by mouth once a day 9)  Bayer Low Strength 81 Mg Tbec (Aspirin) .... Take 1 tablet by mouth once a day 10)  Preservision Areds Caps (Multiple vitamins-minerals) 11)  Hydrocortisone 2.5 % Crea (Hydrocortisone) .... Applied twice a day for 10 days  Patient Instructions: 1)  Please schedule a follow-up appointment in 4 months .  Prescriptions: HYDROCORTISONE 2.5 % CREA (HYDROCORTISONE) applied twice a day for 10 days  #1 x 0   Entered and Authorized by:   Nolon Rod. Paz MD   Signed by:   Nolon Rod. Paz MD on 10/21/2010   Method used:   Print then Give to Patient   RxID:   [id number]    Orders Added: 1)  Venipuncture [14782] 2)  TLB-CBC Platelet - w/Differential [85025-CBCD] 3)  TLB-A1C / Hgb A1C (Glycohemoglobin) [83036-A1C] 4)  TLB-Lipid Panel [80061-LIPID] 5)  TLB-ALT (SGPT) [84460-ALT] 6)  TLB-AST (SGOT) [84450-SGOT] 7)  Specimen Handling [99000] 8)  Est. Patient Level IV [95621]   Immunization History:  Influenza Immunization History:    Influenza:  historical (09/18/2010)   Immunization History:  Influenza Immunization History:    Influenza:  Historical (09/18/2010)

## 2010-12-24 NOTE — Medication Information (Signed)
Summary: Albuterol/[facility]  Albuterol/[facility]   Imported By: Lanelle Bal 05/02/2010 11:57:36  _____________________________________________________________________  External Attachment:    Type:   Image     Comment:   External Document

## 2010-12-24 NOTE — Assessment & Plan Note (Signed)
Summary: rov for abnormal cxr, emphysema   Primary Provider/Referring Provider:  Willow Ora  CC:  Pt is here for a 1 week f/u appt.  Pt saw MW last week 06-26-2010.  Pt states his breathing is "doing great."  Pt denied any sob. Marland Kitchen  History of Present Illness: the pt comes in today for f/u of his recent abnormal cxr.  He has a h/o emphysema, and was seen in my absence by Dr. Sherene Sires.  He had a cxr which showed a RUL airspace disease, but there was some concern for a possible hilar mass.  He underwent ct chest which showed a RUL infiltrate, but no obvious mass or LN.  The pt was treated with 5days of levaquin, but did not really see a difference in how he felt.  He feels he did not have new symptoms at the time of the original cxr.  He denies any cough or congestion.  His breathing is doing well.  Current Medications (verified): 1)  Mucinex 600 Mg Tb12 (Guaifenesin) .... Take 1 Tablet By Mouth Two Times A Day 2)  Combivent 103-18 Mcg/act  Aero (Ipratropium-Albuterol) .... Prn 3)  Albuterol Sulfate (2.5 Mg/33ml) 0.083%  Nebu (Albuterol Sulfate) .... Use 1 Vial Qid As Needed 4)  Symbicort 160-4.5 Mcg/act  Aero (Budesonide-Formoterol Fumarate) .... Two Puffs Twice Daily 5)  Portable 02 .... Dx Emphysema 6)  Lasix 20 Mg Tabs (Furosemide) .... Take 2 Tabs By Mouth Once Daily 7)  Benazepril Hcl 40 Mg  Tabs (Benazepril Hcl) .... Take 1 Tablet By Mouth Once A Day 8)  Lipitor 20 Mg Tabs (Atorvastatin Calcium) .... Take 1 Tablet By Mouth Once A Day 9)  Bayer Low Strength 81 Mg  Tbec (Aspirin) .... Take 1 Tablet By Mouth Once A Day  Allergies (verified): 1)  ! Norvasc 2)  ! * Advair  Review of Systems  The patient denies shortness of breath with activity, shortness of breath at rest, productive cough, non-productive cough, coughing up blood, chest pain, irregular heartbeats, acid heartburn, indigestion, loss of appetite, weight change, abdominal pain, difficulty swallowing, sore throat, tooth/dental problems,  headaches, nasal congestion/difficulty breathing through nose, sneezing, itching, ear ache, anxiety, depression, hand/feet swelling, joint stiffness or pain, rash, change in color of mucus, and fever.    Vital Signs:  Patient profile:   77 year old male Height:      68.5 inches Weight:      166.13 pounds BMI:     24.98 O2 Sat:      94 % on Room air Temp:     98.8 degrees F oral Pulse rate:   85 / minute BP sitting:   146 / 82  (right arm) Cuff size:   regular  Vitals Entered By: Arman Filter LPN (July 05, 2010 3:55 PM)  O2 Flow:  Room air CC: Pt is here for a 1 week f/u appt.  Pt saw MW last week 06-26-2010.  Pt states his breathing is "doing great."  Pt denied any sob.  Comments Medications reviewed with patient Arman Filter LPN  July 05, 2010 3:55 PM    Physical Exam  General:  ow male in nad Lungs:  decreased bs throughout, no wheezing or rhonchi Heart:  rrr, no mrg Extremities:  no edema or cyanosis  Neurologic:  alert and oriented, moves all 4.   Impression & Recommendations:  Problem # 1:  EMPHYSEMA (ICD-492.8) the pt feels his breathing is doing well currently.  I have asked him to conitnue  with his same bronchodilator regimen.  Problem # 2:  ABNORMAL LUNG XRAY (ICD-793.1) the pt had a RUL process by cxr and ct chest which is most c/w pna.  He has been treated with abx, and will need a f/u cxr within a few weeks to verify improvement.  Patient Instructions: 1)  no change in breathing meds 2)  will check cxr in 2 weeks, and let you know the results. 3)  If doing well, followup with me in 6mos.  Appended Document: Orders Update    Clinical Lists Changes  Orders: Added new Service order of Est. Patient Level III (16109) - Signed

## 2010-12-24 NOTE — Miscellaneous (Signed)
Summary: cxr  Clinical Lists Changes  Problems: Added new problem of CHEST XRAY, ABNORMAL (ICD-793.1) Orders: Added new Test order of T-2 View CXR (71020TC) - Signed Observations: Added new observation of OTHER X-RAY: Exam Type: CXR Results: Question a degress of emphysematous change.  No edema or consolidation.  There is a questionable nipple shadow on the left.  A repeat study with nipple markes could be helpful to further evaluate. (06/26/2009 10:31)      X-ray  Procedure date:  07/27/2009  Findings:      Exam Type: CXR Results: Question a degress of emphysematous change.  No edema or consolidation.  There is a questionable nipple shadow on the left.  A repeat study with nipple markes could be helpful to further evaluate.   Comments:      Patient has been contacted to go to [facility] radiology this week for repeat xray.  Order in EMR [facility]  February 04, 2010 10:31 AM    X-ray  Procedure date:  07/27/2009  Findings:      Exam Type: CXR Results: Question a degress of emphysematous change.  No edema or consolidation.  There is a questionable nipple shadow on the left.  A repeat study with nipple markes could be helpful to further evaluate.   Comments:      Patient has been contacted to go to [facility] radiology this week for repeat xray.  Order in EMR [facility]  February 04, 2010 10:31 AM   Appended Document: Orders Update    Clinical Lists Changes  Orders: Added new Test order of T-2 View CXR (71020TC) - Signed

## 2010-12-24 NOTE — Letter (Signed)
Summary: CMN for Albuterol / [facility]  CMN for Albuterol / [facility]   Imported By: Lennie Odor 06/20/2010 10:09:59  _____________________________________________________________________  External Attachment:    Type:   Image     Comment:   External Document

## 2010-12-24 NOTE — Assessment & Plan Note (Signed)
Summary: rov for emphysema   Primary Provider/Referring Provider:  Willow Ora  CC:  Pt is here for a 6 month f/u appt.  Pt states his breathing has been "more difficult" d/t the cold weather.  Pt states occ non-productive cough.  pt denied any other complaints. .  History of Present Illness: The pt comes in today for f/u of his known emphysema.  He has had a little more difficulty with his breathing due to the very cold air, but overall is not far from his usual baseline.  He has discontinued spiriva, and is just taking symbicort at this point.  He has a very mild cough that is nonproductive, but no chest congestion.  He is on an ACE.  Medications Prior to Update: 1)  Mucinex 600 Mg Tb12 (Guaifenesin) .... Take 1 Tablet By Mouth Two Times A Day 2)  Combivent 103-18 Mcg/act  Aero (Ipratropium-Albuterol) .... Prn 3)  Albuterol Sulfate (2.5 Mg/42ml) 0.083%  Nebu (Albuterol Sulfate) .... Use 1 Vial As Needed 4)  Symbicort 160-4.5 Mcg/act  Aero (Budesonide-Formoterol Fumarate) .... Two Puffs Twice Daily 5)  Portable 02 .... Dx Emphysema 6)  Lasix 20 Mg Tabs (Furosemide) .... Take 2 Tabs By Mouth Once Daily 7)  Benazepril Hcl 40 Mg  Tabs (Benazepril Hcl) .... Take 1 Tablet By Mouth Once A Day 8)  Lipitor 20 Mg Tabs (Atorvastatin Calcium) .... Take 1 Tablet By Mouth Once A Day 9)  Bayer Low Strength 81 Mg  Tbec (Aspirin) .... Take 1 Tablet By Mouth Once A Day  Allergies (verified): 1)  ! Norvasc 2)  ! * Advair  Review of Systems      See HPI  Vital Signs:  Patient profile:   77 year old male Height:      68.5 inches Weight:      177.13 pounds BMI:     26.64 O2 Sat:      94 % on Room air Temp:     98.8 degrees F oral Pulse rate:   90 / minute BP sitting:   164 / 82  (left arm) Cuff size:   regular  Vitals Entered By: Arman Filter LPN (December 10, 2009 2:48 PM)  O2 Flow:  Room air CC: Pt is here for a 6 month f/u appt.  Pt states his breathing has been "more difficult" d/t the cold  weather.  Pt states occ non-productive cough.  pt denied any other complaints.  Comments Medications reviewed with patient Arman Filter LPN  December 10, 2009 2:48 PM    Physical Exam  General:  wd male in nad Lungs:  very decreased bs throughout, no wheezing, faint basilar crackles. Heart:  rrr, no mrg Extremities:  no edema or cyanosis Neurologic:  alert and oriented, moves all 4.   Impression & Recommendations:  Problem # 1:  EMPHYSEMA (ICD-492.8)  the pt is near his usual baseline from a pulmonary standpoint.  He has noted a little more sob that he blames on the cold air, but I wonder if it could be due to coming off spiriva.  I have asked him to monitor his symptoms closely, and if he has any worsening, he is to get back on spiriva and let me know.  I have asked him to stay as active as possible and work on modest weight loss.  Other Orders: Est. Patient Level III (57846)  Patient Instructions: 1)  no change in meds.   However, if you find that your breathing is not returning  to its usual level, or if it worsens, would get back on spiriva (in addition to symbicort) 2)  followup with me in 6mos.

## 2010-12-24 NOTE — Miscellaneous (Signed)
Summary: Influenza Vaccination / [facility]  Influenza Vaccination / [facility]   Imported By: Lennie Odor 09/02/2010 14:05:13  _____________________________________________________________________  External Attachment:    Type:   Image     Comment:   External Document

## 2010-12-24 NOTE — Assessment & Plan Note (Signed)
Summary: yearly/ns/kdc   Vital Signs:  Patient profile:   77 year old male Height:      68.5 inches Weight:      163.13 pounds Pulse rate:   70 / minute BP sitting:   120 / 62  Vitals Entered By: Kandice Hams (Apr 15, 2010 8:05 AM) CC: CPX   History of Present Illness: yearly, chart reviewed   COPD-- feels well , uses O 2 almost every night   Hyperlipidemia-- good medication compliance   Hypertension-- ambulatory BPs from time to time, usually "ok", can't tell actual readings  prostate ca (Dx 2008)-- to see urology in June    Diabetes--has changed his diet significantly, feels well   Allergies: 1)  ! Norvasc 2)  ! * Advair  Past History:  Past Medical History: AODM  dx  12-11 COPD--home 02 Syncope-probably neurally mediated Hyperlipidemia Hypertension prostate ca (Dx 2008) ED  Past Surgical History: Reviewed history from 12/20/2007 and no changes required. no  Family History: prostate  ca-- no colon ca-- no Brothers has a history of cirrhosis (liver) CAD-- brother  DM--no  Mother had dementia father had a brain hemorrhage.    Social History: Reviewed history from 10/15/2009 and no changes required. Single, lives w/ son two children Retired independent on ADL, does yard work Patient states former smoker. quit 2007. ETOH-- rarely  Review of Systems General:  (+) wt loss , has changed his diet, less sugars . CV:  Denies chest pain or discomfort and swelling of feet. Resp:  Denies coughing up blood; SOB at baseline rarely coughs, usually white sputum . GI:  Denies diarrhea, nausea, and vomiting; blood in stools x  2,thinks from hemorrhoids  (small  amount in the t.p.). Psych:  Denies anxiety and depression.  Physical Exam  General:  alert, well-developed, and well-nourished.   Neck:  normal carotid upstroke.   Lungs:  normal respiratory effort, no intercostal retractions, and no accessory muscle use.  decreased breath sounds  throughout Heart:  normal rate, regular rhythm, no murmur, and no gallop. distant  heart sounds Abdomen:  soft, non-tender, no distention, no masses, no guarding, and no rigidity.   Extremities:  no edema Psych:  Cognition and judgment appear intact. Alert and cooperative with normal attention span and concentration ,not anxious appearing and not depressed appearing.     Impression & Recommendations:  Problem # 1:  HEALTH SCREENING (ICD-V70.0) CHART REVIEWED  Td 09 pneumonia shot 2005 and 11--2010  last colonoscopy 06/2009, next in 5 years   PSA per urology  Encourage to continue his healthy lifestyle  Problem # 2:  DM (ICD-250.00) status post nutritionist advise, doing great His updated medication list for this problem includes:    Benazepril Hcl 40 Mg Tabs (Benazepril hcl) .Marland Kitchen... Take 1 tablet by mouth once a day    Bayer Low Strength 81 Mg Tbec (Aspirin) .Marland Kitchen... 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 Take 2 tabs by mouth once daily    Benazepril Hcl 40 Mg Tabs (Benazepril hcl) .Marland KitchenMarland KitchenMarland KitchenMarland Kitchen  Take 1 tablet by mouth once a day  Orders: TLB-BMP (Basic Metabolic Panel-BMET) (80048-METABOL)  BP today: 120/62 Prior BP: 142/106 (01/21/2010)  Labs Reviewed: K+: 4.1 (10/17/2009) Creat: : 1.1 (10/17/2009)   Chol: 163 (10/17/2009)   HDL: 42.80 (10/17/2009)   LDL: 94 (10/17/2009)   TG: 133.0 (10/17/2009)  Problem # 6:  HYPERLIPIDEMIA (ICD-272.4) at goal, labs His updated medication list for this problem includes:    Lipitor 20  Mg Tabs (Atorvastatin calcium) .Marland Kitchen... Take 1 tablet by mouth once a day  Orders: TLB-ALT (SGPT) (84460-ALT) TLB-AST (SGOT) (84450-SGOT)  Labs Reviewed: SGOT: 15 (10/17/2009)   SGPT: 15 (10/17/2009)   HDL:42.80 (10/17/2009), 40.0 (09/06/2008)  LDL:94 (10/17/2009), 105 (16/08/9603)  Chol:163 (10/17/2009), 174 (09/06/2008)  Trig:133.0 (10/17/2009), 143 (09/06/2008)  Complete Medication List: 1)  Mucinex 600 Mg Tb12 (Guaifenesin) .... Take 1 tablet by mouth two times a day 2)  Combivent 103-18 Mcg/act Aero (Ipratropium-albuterol) .... Prn 3)  Albuterol Sulfate (2.5 Mg/33ml) 0.083% Nebu (Albuterol sulfate) .... Use 1 vial as needed 4)  Symbicort 160-4.5 Mcg/act Aero (Budesonide-formoterol fumarate) .... Two puffs twice daily 5)  Portable 02  .... Dx emphysema 6)  Lasix 20 Mg Tabs (Furosemide) .... Take 2 tabs by mouth once daily 7)  Benazepril Hcl 40 Mg Tabs (Benazepril hcl) .... Take 1 tablet by mouth once a day 8)  Lipitor 20 Mg Tabs (Atorvastatin calcium) .... Take 1 tablet by mouth once a day 9)  Bayer Low Strength 81 Mg Tbec (Aspirin) .... Take 1 tablet by mouth once a day  Patient Instructions: 1)  Please schedule a follow-up appointment in 4 to 6  months .

## 2010-12-24 NOTE — Assessment & Plan Note (Signed)
Summary: f/u - jr   Vital Signs:  Patient profile:   77 year old male Height:      68.5 inches Weight:      168.6 pounds BMI:     25.35 O2 Sat:      89 % on Room air Pulse rate:   113 / minute BP sitting:   142 / 106  Vitals Entered By: Shary Decamp (January 21, 2010 12:53 PM)  O2 Flow:  Room air CC: cough x 4-5 days   History of Present Illness: Routine office visit COPD developed a cold two days ago, today feels a slightly better  diabetes new dx since last OV, has changed his diet, eating less sugar, has lost some weight    Current Medications (verified): 1)  Mucinex 600 Mg Tb12 (Guaifenesin) .... Take 1 Tablet By Mouth Two Times A Day 2)  Combivent 103-18 Mcg/act  Aero (Ipratropium-Albuterol) .... Prn 3)  Albuterol Sulfate (2.5 Mg/64ml) 0.083%  Nebu (Albuterol Sulfate) .... Use 1 Vial As Needed 4)  Symbicort 160-4.5 Mcg/act  Aero (Budesonide-Formoterol Fumarate) .... Two Puffs Twice Daily 5)  Portable 02 .... Dx Emphysema 6)  Lasix 20 Mg Tabs (Furosemide) .... Take 2 Tabs By Mouth Once Daily 7)  Benazepril Hcl 40 Mg  Tabs (Benazepril Hcl) .... Take 1 Tablet By Mouth Once A Day 8)  Lipitor 20 Mg Tabs (Atorvastatin Calcium) .... Take 1 Tablet By Mouth Once A Day 9)  Bayer Low Strength 81 Mg  Tbec (Aspirin) .... Take 1 Tablet By Mouth Once A Day  Allergies (verified): 1)  ! Norvasc 2)  ! * Advair  Review of Systems       which he is at recent coldhe has not developed any fever shortness of breath is lying in both baseline he did cough up some sputum the first two days but not  today.  No hemoptysis  Physical Exam  General:  alert and well-developed.   Ears:  R ear normal and L ear normal.   Nose:  not congested Lungs:  normal respiratory effort, no intercostal retractions, and no accessory muscle use.  Decreased BS Heart:  normal rate, regular rhythm, no murmur, and no gallop. distant  heart sounds   Impression & Recommendations:  Problem # 1:  DM  (ICD-250.00)  recheck labs more information provided about diet His updated medication list for this problem includes:    Benazepril Hcl 40 Mg Tabs (Benazepril hcl) .Marland Kitchen... Take 1 tablet by mouth once a day    Bayer Low Strength 81 Mg Tbec (Aspirin) .Marland Kitchen... Take 1 tablet by mouth once a day  Orders: Venipuncture (10272) TLB-A1C / Hgb A1C (Glycohemoglobin) (83036-A1C) TLB-Microalbumin/Creat Ratio, Urine (82043-MALB)  Problem # 2:  EMPHYSEMA (ICD-492.8) stable except for the last couple of days, has developed a URI ; symptoms are more noticeable than usual he is still using oxygen "some days" plan: Antibiotics call if not better recommend to use oxygen every night  Complete Medication List: 1)  Mucinex 600 Mg Tb12 (Guaifenesin) .... Take 1 tablet by mouth two times a day 2)  Combivent 103-18 Mcg/act Aero (Ipratropium-albuterol) .... Prn 3)  Albuterol Sulfate (2.5 Mg/37ml) 0.083% Nebu (Albuterol sulfate) .... Use 1 vial as needed 4)  Symbicort 160-4.5 Mcg/act Aero (Budesonide-formoterol fumarate) .... Two puffs twice daily 5)  Portable 02  .... Dx emphysema 6)  Lasix 20 Mg Tabs (Furosemide) .... Take 2 tabs by mouth once daily 7)  Benazepril Hcl 40 Mg Tabs (Benazepril hcl) .Marland KitchenMarland KitchenMarland Kitchen  Take 1 tablet by mouth once a day 8)  Lipitor 20 Mg Tabs (Atorvastatin calcium) .... Take 1 tablet by mouth once a day 9)  Bayer Low Strength 81 Mg Tbec (Aspirin) .... Take 1 tablet by mouth once a day 10)  Zithromax Z-pak 250 Mg Tabs (Azithromycin) .... Asked directed  Patient Instructions: 1)  rest, fluids, Tylenol, Robitussin-DM 2)  use a nebulization at least two times a day for the next 3 days 3)  start a Z-Pak, antibiotic 4)  call if not better in a few days 5)  Please schedule a follow-up appointment in 3 months .  Prescriptions: ZITHROMAX Z-PAK 250 MG TABS (AZITHROMYCIN) asked directed  #1 x 0   Entered and Authorized by:   Nolon Rod. Paz MD   Signed by:   Nolon Rod. Paz MD on 01/21/2010   Method used:    Electronically to        [id number] (retail)       [address]       [place], [place]  [address]       Ph: [phone] or [phone]       Fax: [phone]   RxID:   [id number]

## 2010-12-24 NOTE — Letter (Signed)
Summary: prostate ca, doing well, s/p XRT--- Urology  [facility]   Imported By: Sherian Rein 05/20/2010 14:47:01  _____________________________________________________________________  External Attachment:    Type:   Image     Comment:   External Document

## 2010-12-24 NOTE — Assessment & Plan Note (Signed)
Summary: Pulmonary/ f/u ov with hfa teaching efffective only 25%   Primary Provider/Referring Provider:  Willow [name]  CC:  Followup on emphysema- pt of Dr Teddy Spike.  He states that his breathing has been overall okay.  He denies any complaints.  .  History of Present Illness: 77 yowm quit smoking in 2007 with abn cxr ? rul mass  June 26, 2010   June 26, 2010 cc doe x ex only with minimal combivent need , chronically hoarse, somewhat congested cough but says nothing new and doesn't bother him .  Pt denies any significant sore throat, dysphagia, itching, sneezing,  nasal congestion or excess secretions,  fever, chills, sweats, unintended wt loss, pleuritic or exertional cp, hempoptysis, change in activity tolerance  orthopnea pnd or leg swelling.   Allergies (verified): 1)  ! Norvasc 2)  ! 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  Physical Exam  Additional Exam:  amb thin chronically ill wm nad but quite hoarse HEENT mild turbinate edema.  Oropharynx no thrush or excess pnd or cobblestoning.  No JVD or cervical adenopathy. Mild accessory muscle hypertrophy. Trachea midline, nl thryroid. Chest was hyperinflated by percussion with diminished breath sounds and moderate increased exp time without wheeze. Hoover sign positive at mid inspiration.  Regular rate and rhythm without murmur gallop or rub or increase P2 or edema.  Abd: no hsm, nl excursion. Ext warm without cyanosis or clubbing.     CXR  Procedure date:  06/26/2010  Findings:        Comparison: The chest x-ray of 02/06/2010   Findings: There is new parenchymal opacity within the right upper lobe most consistent with pneumonia.  The lungs are hyperaerated indicative of COPD.  No effusion is seen.  Heart size is stable and mediastinal contours are stable.  No bony abnormality is seen.   IMPRESSION: New opacity in the right upper lobe most consistent with pneumonia. COPD.  Impression & Recommendations:  Problem # 1:  EMPHYSEMA (ICD-492.8)  Relatively well compensated on present rx despite very poor hfa technique  I spent extra time with the patient today explaining optimal mdi  technique.  This improved from  10-25% but no better  Problem # 2:  ABNORMAL LUNG XRAY (ICD-793.1)  Radiology reading this as pna but the right hilum is quite dense and suspicion for lung ca relatively high here with no acute symptoms whatsoever to support dx of cap.  Discussed in detail all the  indications, usual  risks and alternatives  relative to the benefits with patient who agrees to proceed with ct chest as first step  Orders:  Misc. Referral (Misc. Ref) Est. Patient Level IV (65784) HFA Instruction [phone])  Problem # 3:  HYPERTENSION (ICD-401.9)  His updated medication list for this problem includes:    Lasix 20 Mg Tabs (Furosemide) .Marland Kitchen... Take 2 tabs by mouth once daily    Benazepril Hcl 40 Mg Tabs (Benazepril hcl) .Marland Kitchen... Take 1 tablet by mouth once a day  ok on ace though quite hoarse, low threshold to consider change to ARB here for worsening resp symptoms.    MB ACE inhibitors are problematic in  pts with airway complaints because  even experienced pulmonologists (eg me!)  can't always distinguish ace effects from copd/asthma.  By themselves they don't actually cause a  problem, much like oxygen can't by itself start a fire, but they certainly serve as a powerful catalyst or enhancer for any "fire"  or inflammatory process in the upper airway, be it caused by an ET  tube or more commonly reflux (especially in the obese or pts with known GERD or who are on biphoshonates).   Other Orders: T-2 View CXR (71020TC)  Patient Instructions: 1)  Work on inhaler technique:  relax and blow all the way out then take a nice smooth deep breath back in, triggering the inhaler at same time you start breathing in hold for a few seconds 2)  If your breathing worsens or you need to use your rescue inhaler (combivent) more than twice weekly or wake up more than twice a month with any respiratory symptoms or require more than two rescue inhalers per year, we need to see you right away. 3)   See Dr Shelle Iron in 6 months

## 2010-12-26 NOTE — Letter (Signed)
Summary: responding well to XRT for prostat  [place] [facility]  [facility]   Imported By: Lanelle Bal 11/14/2010 11:34:27  _____________________________________________________________________  External Attachment:    Type:   Image     Comment:   External Document

## 2011-01-01 NOTE — Assessment & Plan Note (Signed)
Summary: rov for emphysema, RUL process on cxr.   Primary Provider/Referring Provider:  Willow Ora  CC:  6 month f/u appt.  states breathing is "fine."  Denies any new concerns. .  History of Present Illness: the pt comes in today for f/u of his known emphysema and known abnormal cxr.  He is stable from a breathing standpoint, and has not had to overuse his rescue meds.  He denies any cough, congestion, or recent infection.  He has been maintaining stable weight.  He is due for his last cxr f/u for his RUL process.  Current Medications (verified): 1)  Mucinex 600 Mg Tb12 (Guaifenesin) .... Take 1 Tablet By Mouth Two Times A Day 2)  Combivent 103-18 Mcg/act  Aero (Ipratropium-Albuterol) .... Prn 3)  Albuterol Sulfate (2.5 Mg/52ml) 0.083%  Nebu (Albuterol Sulfate) .... Use 1 Vial Qid As Needed 4)  Symbicort 160-4.5 Mcg/act  Aero (Budesonide-Formoterol Fumarate) .... Two Puffs Twice Daily 5)  Portable 02 .... Dx Emphysema 6)  Lasix 20 Mg Tabs (Furosemide) .... Take 2 Tabs By Mouth Once Daily 7)  Benazepril Hcl 40 Mg  Tabs (Benazepril Hcl) .... Take 1 Tablet By Mouth Once A Day 8)  Lipitor 20 Mg Tabs (Atorvastatin Calcium) .... Take 1 Tablet By Mouth Once A Day 9)  Bayer Low Strength 81 Mg  Tbec (Aspirin) .... Take 1 Tablet By Mouth Once A Day 10)  Preservision Areds  Caps (Multiple Vitamins-Minerals) 11)  Pataday 0.2 % Soln (Olopatadine Hcl) .Marland Kitchen.. 1 Drop in Each Eye Once Daily  Allergies (verified): 1)  ! Norvasc 2)  ! * Advair  Review of Systems       The patient complains of productive cough and nasal congestion/difficulty breathing through nose.  The patient denies shortness of breath with activity, shortness of breath at rest, non-productive cough, coughing up blood, chest pain, irregular heartbeats, acid heartburn, indigestion, loss of appetite, weight change, abdominal pain, difficulty swallowing, sore throat, tooth/dental problems, headaches, sneezing, itching, ear ache, anxiety,  depression, hand/feet swelling, joint stiffness or pain, rash, change in color of mucus, and fever.    Vital Signs:  Patient profile:   77 year old male Height:      68.5 inches Weight:      168.50 pounds BMI:     25.34 O2 Sat:      94 % on Room air Temp:     98.3 degrees F oral Pulse rate:   68 / minute BP sitting:   150 / 82  (left arm) Cuff size:   regular  Vitals Entered By: Arman Filter LPN (December 23, 2010 8:56 AM)  O2 Flow:  Room air CC: 6 month f/u appt.  states breathing is "fine."  Denies any new concerns.  Comments Medications reviewed with patient Arman Filter LPN  December 23, 2010 8:57 AM    Physical Exam  General:  wd male in nad Lungs:  diffuse decrease in bs, no wheezing or rhonchi Heart:  distant, rrr, no mrg Extremities:  no edema or cyanosis  Neurologic:  alert and oriented, moves all 4.   Impression & Recommendations:  Problem # 1:  EMPHYSEMA (ICD-492.8) the pt has been doing well on his current meds.  He has had no acute exacerbation, no increased rescue inhaler use, and no change in his exertional tolerance.  I have asked him to continue with his current meds.  Problem # 2:  ABNORMAL LUNG XRAY (ICD-793.1) the pt has had some RUL changes that we have  been following after an episode of pna.  He has had no significant change, and is due for one more f/u today.  If stable, no further check needed unless his symptoms change.  Medications Added to Medication List This Visit: 1)  Pataday 0.2 % Soln (Olopatadine hcl) .Marland Kitchen.. 1 drop in each eye once daily  Other Orders: Est. Patient Level III (78295) T-2 View CXR (71020TC)  Patient Instructions: 1)  will check one more chest xray today.  If stable, no futher followup required. 2)  try and stay as active as possible. 3)  no change in meds. 4)  followup with me in 6mos.

## 2011-02-20 ENCOUNTER — Encounter: Payer: Self-pay | Admitting: Internal Medicine

## 2011-02-20 ENCOUNTER — Ambulatory Visit (INDEPENDENT_AMBULATORY_CARE_PROVIDER_SITE_OTHER): Payer: Medicare Other | Admitting: Internal Medicine

## 2011-02-20 DIAGNOSIS — I10 Essential (primary) hypertension: Secondary | ICD-10-CM

## 2011-02-20 DIAGNOSIS — E119 Type 2 diabetes mellitus without complications: Secondary | ICD-10-CM

## 2011-02-20 DIAGNOSIS — E785 Hyperlipidemia, unspecified: Secondary | ICD-10-CM

## 2011-02-20 LAB — HEMOGLOBIN A1C: Hgb A1c MFr Bld: 6.2 % (ref 4.6–6.5)

## 2011-02-20 NOTE — Assessment & Plan Note (Signed)
stable °

## 2011-02-20 NOTE — Assessment & Plan Note (Signed)
At goal.  

## 2011-02-20 NOTE — Assessment & Plan Note (Signed)
Abnormal CXR after a pneumonia, last CXR 1-12, stable, no further f/u CXRs

## 2011-02-20 NOTE — Progress Notes (Signed)
  Subjective:    Patient ID: Jim [name], male    DOB: 02/22/1932, 77 y.o.   MRN: [id number]  HPI Chart is reviewed... since the last OV  he saw urology for prostate cancer, he was responding well to radiation therapy Emphysema, he saw pulmonology, doing well Has a history of an abnormal chest x-ray after a  pneumonia, last x-ray was 1-12, it was a stable, no further x-rays at this point DM-- diet only , no amb CBGs HTN-- on ACEi,  ambulatory BPs ok   Review of Systems  Respiratory: Negative for wheezing. Shortness of breath: SOB @ baseline, able to do yard work.        Cough up white mucus in AM, no hemoptysis  Gastrointestinal: Negative for nausea, abdominal pain, diarrhea and blood in stool.  Psychiatric/Behavioral:       No anxiety, depression      Past Medical History  Diagnosis Date  . DM (diabetes mellitus) 10/2009  . 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 Abnormal chest xray     CT Ordered 06/26/2010  . Syncope   . Hyperlipidemia   . Hypertension   . Prostate cancer 2008  . ED (erectile dysfunction)    Social History: Reviewed history from 10/15/2009 and no changes required. Single, lives w/ son two children Retired independent on [employer], does yard work Patient is former smoker. quit 2007. ETOH-- rarely    Objective:   Physical Exam  Constitutional: He appears well-developed and well-nourished. No distress.  Cardiovascular: Normal rate and regular rhythm.        Normal but distant heart sounds  Pulmonary/Chest: Effort normal. No respiratory distress. He has no wheezes. He has no rales.       BS decreased   Musculoskeletal: He exhibits no edema.  Psychiatric: He has a normal mood and affect. His behavior is normal. Judgment and thought content normal.          Assessment & Plan:

## 2011-02-22 NOTE — Assessment & Plan Note (Signed)
labs

## 2011-03-07 ENCOUNTER — Other Ambulatory Visit: Payer: Self-pay | Admitting: Pulmonary Disease

## 2011-03-12 ENCOUNTER — Other Ambulatory Visit: Payer: Self-pay | Admitting: Internal Medicine

## 2011-04-08 NOTE — H&P (Signed)
Jim [name], Jim [name]            ACCOUNT NO.:  [id number]   MEDICAL RECORD NO.:  [id number]          PATIENT TYPE:  EMS   LOCATION:  [facility]                         FACILITY:  [facility]   PHYSICIAN:  Deirdre Peer. Polite, M.D. DATE OF BIRTH:  July 30, 1932   DATE OF ADMISSION:  09/04/2007  DATE OF DISCHARGE:                              HISTORY & PHYSICAL   CHIEF COMPLAINT:  Shortness of breath.   HISTORY OF PRESENT ILLNESS:  This is a pleasant 77 year old male with  known history of COPD, O2 dependent, hypertension and  hypercholesterolemia, who presents to the ED with the above complaints.  The patient states that his breathing has been getting worse over the  last couple of days.  He does admit to me not using his oxygen on a  daily basis, mainly uses it at night.  He has not smoked in two years.  He also admits to having productive cough with colored sputum, denies  any sick contacts, does admit compliance to his medications.  Today, he  had some dyspnea.  The patient took his neb treatments today and got on  his oxygen, however, did not have improvement in his breathing.  Because  of that, presented to the ED for evaluation in the ED.  The patient was  evaluated, had a x-ray, showed COPD, questionable infiltrate in the  right lobe.  The patient was afebrile.  Initial pulse 113, sat 93%,  white count of 10, BMET unremarkable.  Point of care enzymes  unremarkable.  The patient was treated with O2 nebs, antibiotics and  steroids in the ED.  ED felt that admission was deemed necessary for  further evaluation and treatment.   PAST MEDICAL HISTORY:  As stated above.   CURRENT MEDICATIONS:  Include:  1. Combivent.  2. Lipitor 20 mg daily.  3. Benazepril 20 mg daily.  4. Aspirin 81 mg daily.  5. Mucinex.  6. Advair Diskus.  7. Albuterol.   SOCIAL HISTORY:  Negative for tobacco x2 years, prior to that 2 packs a  day for greater than 40 years.  He denies alcohol or drugs.   PAST SURGICAL  HISTORY:  None.   ALLERGIES:  HE IS ALLERGIC TO NORVASC THAT CAUSES SWELLING.   FAMILY HISTORY:  Noncontributory.   PHYSICAL EXAMINATION:  GENERAL:  The patient is alert and oriented x3.  No apparent stress.  VITAL SIGNS:  Initial BP 174/99, followup 159/90, temp 97.1, pulse 91,  respiratory rate of 20, satting 95% on nasal cannula O2.  HEENT:  Unremarkable.  CHEST:  Moderate air movement without rales or rhonchi.  CARDIOVASCULAR:  Regular, no S3.  ABDOMEN:  Soft, nontender.  EXTREMITIES:  Without edema.  NEURO:  Nonfocal.   ASSESSMENT:  1. Chronic obstructive pulmonary disease exacerbation, improved post      treatment in the ED.  2. Hypercholesterolemia.  3. Hypertension.   Recommend the patient be admitted to medicine floor bay for evaluation  and treatment of COPD exacerbation.  The patient will be treated with  general IV fluids, antibiotics, O2 nebs and steroids.  Will resume his  other outpatient medications and  make further recommendations, pending  patient's hospital course.  Thank you in advance.      Deirdre Peer. Polite, M.D.  Electronically Signed     RDP/MEDQ  D:  09/04/2007  T:  09/04/2007  Job:  [id number]   cc:   Willow Ora, MD

## 2011-04-08 NOTE — Discharge Summary (Signed)
Jim [name], Jim [name]            ACCOUNT NO.:  [id number]   MEDICAL RECORD NO.:  [id number]          PATIENT TYPE:  INP   LOCATION:  3038                         FACILITY:  [facility]   PHYSICIAN:  Georgina Quint. Plotnikov, MDDATE OF BIRTH:  [birth year]   DATE OF ADMISSION:  09/04/2007  DATE OF DISCHARGE:  09/07/2007                               DISCHARGE SUMMARY   DISCHARGE DIAGNOSES:  1. Acute COPD exacerbation with hypoxia and questionable right sided      pneumonia.  2. Hypertension, uncontrolled.  3. History of tobacco abuse.   HISTORY OF PRESENT ILLNESS:  Jim [name] is a 77 year old male with a  known history of oxygen dependent COPD who presented to the emergency  department with chief complaint of shortness of breath.  He was admitted  for further evaluation and treatment.   PAST MEDICAL HISTORY:  1. COPD home O2 dependent.  2. Hypertension.  3. Hypercholesterolemia.  4. History of tobacco abuse.   COURSE OF HOSPITALIZATION:  #1 - ACUTE COPD EXACERBATION/RIGHT-SIDED  PNEUMONIA.  The patient was admitted and was initially placed on IV Solu-  Medrol.  This was then changed to oral prednisone.  He was also treated  with IV Avelox.  He is currently afebrile.  However, he does remain  hypoxic which is a chronic issue for him.  However, it is probably  likely a little worse in the setting of his acute COPD exacerbation.  Current oxygen saturation is 91% on 3 liters.  However, he is clinically  improved at this time.  Plan to continue prednisone taper as an  outpatient and continue scheduled nebulizer treatments at home.  He is  instructed to wear oxygen 3 liters nasal cannula at all times until  further evaluation per Dr. Drue Novel.  The patient did express some concern  that his Advair may have contributed to his recent pneumonia.  We will  hold his Advair at this time and defer to the patient's primary care to  resume.   #2 - HYPERTENSION UNCONTROLLED.  The patient was noted to  have elevated  blood pressure with systolic pressures as high as 213Y during this  admission on his benazepril.  Will increase his dose from 20-40 mg.  This may continue to outpatient follow-up.   DISCHARGE MEDICATIONS:  1. Lipitor 20 mg p.o. daily.  2. Avelox 400 mg p.o. daily for seven additional days.  3. Furosemide 20 mg p.o. daily.  4. Aspirin 81 mg p.o. daily.  5. Mucinex 600 mg p.o. daily.  6. Spiriva one cap inhalation daily.  7. Albuterol nebs q.6 h.  8. Prednisone 30 mg on October 15-16, 2008, then 20 mg on October 16-      17, then 10 mg of October 18-19, then stop.  9. Oxygen 3 liters nasal cannula at all times.  10.Protonix 30 mg p.o. daily for GI prophylaxis on steroids.  11.Benazepril 40 mg p.o. daily.   LABORATORIES AT TIME OF DISCHARGE:  Hemoglobin 15.3, hematocrit 44, BUN  11, creatinine 1.06.   DISPOSITION:  The patient be discharged to home.   FOLLOW UP:  The patient is scheduled  to follow up with Dr. Willow Ora on  October 24 at 12:45 p.m.      Sandford Craze, NP      Georgina Quint. Plotnikov, MD  Electronically Signed    MO/MEDQ  D:  09/07/2007  T:  09/08/2007  Job:  [id number]   cc:   Willow Ora, MD

## 2011-04-08 NOTE — H&P (Signed)
NAMEKEILAND, PICKERING            ACCOUNT NO.:  [id number]   MEDICAL RECORD NO.:  [id number]          PATIENT TYPE:  INP   LOCATION:  5502                         FACILITY:  [facility]   PHYSICIAN:  Kela Millin, M.D.DATE OF BIRTH:  25-Aug-1932   DATE OF ADMISSION:  09/22/2007  DATE OF DISCHARGE:                              HISTORY & PHYSICAL   CHIEF COMPLAINT:  Worsening shortness of breath.   HISTORY OF PRESENT ILLNESS:  The patient is a 77 year old, white male  with past medical history significant for COPD, home-dependent, recently  discharged from the hospital on October 14, following hospitalization  for COPD exacerbation with question of right-sided pneumonia, history of  hypertension who presents with the above complaints.  He states that he  has had increasing shortness of breath in the past 1-2 days and also a  cough productive of whitish sputum x1 day.  He denies chest pain,  fevers, PND, nausea, vomiting, dysuria, melena and no hematochezia.   The patient was seen in the ER and had a chest x-ray which revealed a  right middle lobe density suspicious for an infiltrate versus  atelectasis, no edema and no effusion.  His B-type natriuretic peptide  was 155 and his point of care markers negative.  He was given nebulized  bronchodilators in the ER and also started on IV antibiotics.  Because  his symptoms persisted, he has been admitted for further evaluation and  management.   PAST MEDICAL HISTORY:  1. As above.  2. History of hypercholesterolemia.  3. Past history of tobacco abuse.   MEDICATIONS:  1. Albuterol.  2. Furosemide 20 mg daily.  3. Aspirin 81 mg daily.  4. Combivent 2 puffs four times a day.  5. Lipitor 20 mg daily.  6. Spiriva hand inhaler daily.  7. Benazepril 40 mg p.o. daily.  8. Mucinex D one tablet daily.   ALLERGIES:  NORVASC causes swelling.   SOCIAL HISTORY:  He states that he quit tobacco 2 years ago and prior to  that, had smoked two  packs per day for greater than 40 years.  He denies  alcohol.   FAMILY HISTORY:  Noncontributory to his current illness.   REVIEW OF SYSTEMS:  As per HPI.   PHYSICAL EXAMINATION:  GENERAL:  The patient is an elderly, white male,  occasionally grunting, no accessory muscle use, alert and appropriate.  VITAL SIGNS:  Temperature is 97.2, blood pressure is 159/92, initially  191/104, his pulse is 87, respiratory rate of 20, O2 saturations of 97%.  HEENT:  PERRLA, EOMI, sclerae anicteric, moist mucous membranes, no oral  exudates.  NECK:  Supple, no adenopathy and no JVD.  LUNGS:  He has scattered rhonchi bilaterally.  No wheezes.  CARDIAC:  Regular rate and rhythm.  Normal S1, S2.  ABDOMEN:  Soft, bowel sounds present, nontender, nondistended.  No  organomegaly and no masses palpable.  EXTREMITIES:  No cyanosis and no edema.   LABORATORY DATA:  Sodium is 139 with a potassium of 3.8, chloride 109,  BUN of 10, glucose 122.  ABG with pH of 7.47.  Creatinine is 1.0.  Point  of care markers negative x1.  White cell count is 8.6 with a hemoglobin  of 14.3, hematocrit of 41.8, platelet count of 225.  His B-type  natriuretic peptide is 155.   Chest x-ray as per HPI.   ASSESSMENT/PLAN:  1. Right middle lobe pneumonia - unclear if this is a new infiltrate      since as noted per HPI.  The patient was just discharged from the      hospital on October 14, following treatment for right-sided      pneumonia.  Will continue current IV antibiotics, continue      supplemental oxygen.  2. Chronic obstructive pulmonary disease exacerbation, likely      secondary to above.  Will place on nebulized bronchodilators,      steroids and continue supplemental oxygen.  3. Hypertension.  Will continue outpatient medications.  4. Hypercholesterolemia.  Continue Lipitor.      Kela Millin, M.D.  Electronically Signed     ACV/MEDQ  D:  09/24/2007  T:  09/25/2007  Job:  [id number]   cc:   Willow Ora,  MD

## 2011-04-08 NOTE — H&P (Signed)
Jim [name], Jim [name]            ACCOUNT NO.:  [id number]   MEDICAL RECORD NO.:  [id number]          PATIENT TYPE:  INP   LOCATION:  4731                         FACILITY:  [facility]   PHYSICIAN:  Candyce Churn, M.D.DATE OF BIRTH:  1932/07/13   DATE OF ADMISSION:  10/02/2007  DATE OF DISCHARGE:                              HISTORY & PHYSICAL   CHIEF COMPLAINT:  Sudden increase in shortness of breath today.   HISTORY OF PRESENT ILLNESS:  Jim [name] is a pleasant 77 year old male  with a history of COPD and recent pneumonia.  He was recently discharged  from [facility] on September 26, 2007 for COPD and pneumonia.  He  also has hypercholesterolemia and hypertension.  He presents with the  acute onset of shortness of breath this evening.  With this sudden onset  of shortness of breath, EMS was called and brought him to the emergency  room and he was in severe respiratory distress.  He was treat with IV  Solu-Medrol en route as well as nebulizers and placed on the CPAP and  after approximately an hour of CPAP, was weaned off and still wheezing,  but in much less respiratory distress.  He is admitted now for further  IV therapy.   PAST MEDICAL HISTORY:  1. COPD.  2. Extensive history of tobacco use.  3. Hypertension.  4. Right middle lobe pneumonia in October 2008.  5. Hypercholesterolemia.   MEDICATIONS:  1. Albuterol nebulizers q.i.d.  2. Lasix 20 mg daily.  3. Aspirin 81 mg daily  4. Combivent 2 puffs inhaled 4 times a day.  5. Lipitor 20 mg a day.  6. Spiriva 18 mcg one capsule punctured and inhaled daily.  7. Benazepril 40 mg daily.  8. Mucinex D one tablet daily.   ALLERGIES:  NORVASC causes swelling.   SOCIAL HISTORY:  He quit tobacco 2 years ago; he used to smoke 2 packs a  day for 40 years prior to that.  No alcohol.   FAMILY HISTORY:  Noncontributory to current illness.   REVIEW OF SYSTEMS:  As per HPI.  He denies any fevers, chills or chest   pain.   PHYSICAL EXAM:  GENERAL:  An elderly male with mild respiratory  distress, alert and conversive, very pleasant.  VITAL SIGNS:  Blood pressure 132/83, pulse 104 and regular, respiratory  rate is approximately 28, mildly labored, 97% saturated on 2 liters.  HEENT:  Exam is benign.  NECK:  Supple without JVD.  CHEST:  Coarse breath sounds bilaterally with diffuse mild expiratory  wheezes.  CARDIAC:  Regular rhythm without murmurs or gallops.  ABDOMEN:  Soft, nontender.  EXTREMITIES:  Without cyanosis, clubbing or edema.  NEUROLOGICAL:  Nonfocal.  SKIN:  Without rashes.   LABORATORY AND ACCESSORY CLINICAL DATA:  Chest x-ray:  COPD, but no  acute infiltrates.   EKG is pending.   Cardiac enzymes are pending.  White count 11,400, hemoglobin 14.7,  platelet count 336,000 with 46% neutrophils, 30% lymphocytes, 14%  eosinophils.  Sodium 135, potassium 3.8, chloride 103, bicarb 23,  glucose 161, BUN 17, creatinine 1.22.  LFTs within  normal limits.  Urinalysis and TSH pending.   ASSESSMENT:  Multiple episodes of chronic obstructive pulmonary disease  requiring 3 admissions in the past month.  We will readmit for recurrent  exacerbation.   We will treat with intravenous Solu-Medrol, aerosolized atropine and  albuterol and oxygen.  I do not think antibiotics are necessary at this  time.  I would recommend prolonged steroid use and especially a steroid  taper at discharge.  I would also recommend that he be followed up with  pulmonary consultation.  Hopefully, this hospitalization will be brief.      Candyce Churn, M.D.  Electronically Signed     RNG/MEDQ  D:  10/02/2007  T:  10/03/2007  Job:  [id number]   cc:   Willow Ora, MD

## 2011-04-08 NOTE — Discharge Summary (Signed)
NAMEBRANDEN, [name]            ACCOUNT NO.:  [id number]   MEDICAL RECORD NO.:  [id number]          PATIENT TYPE:  INP   LOCATION:  4731                         FACILITY:  [facility]   PHYSICIAN:  Valerie A. Felicity Coyer, MDDATE OF BIRTH:  1932-10-03   DATE OF ADMISSION:  10/02/2007  DATE OF DISCHARGE:  10/04/2007                               DISCHARGE SUMMARY   DISCHARGE DIAGNOSES:  1. Acute recurrent COPD exacerbation.  2. Hypertension.  3. History of tobacco abuse.  4. Hypercholesterolemia.  5. Status post hospitalization for right lower lobe pneumonia October      2008.   HISTORY OF PRESENT ILLNESS:  Jim [name] is a 77 year old male who was  admitted on October 02, 2007, with chief complaint of sudden increase of  shortness of breath.  He was recently discharged from [facility], September 26, 2007 for COPD and pneumonia.  EMS was called and  brought the patient to the emergency room, at which time he was  apparently in severe respiratory distress.  He was treated with IV Solu-  Medrol as well as nebulizers, was placed on CPAP.  Approximately one  hour after being placed on CPAP he was weaned off but continued to  wheeze.  He was admitted for further evaluation and treatment.   PAST MEDICAL HISTORY:  1. COPD.  2. Extensive history of tobacco abuse.  3. Hypertension.  4. Right middle lobe pneumonia October 2008.  5. Hypercholesterolemia.   COURSE OF HOSPITALIZATION:  #1 - ACUTE COPD EXACERBATION.  The patient  was admitted and was placed on IV Solu-Medrol.  These were the nebulizer  treatments.  Chest x-ray did not show any acute infiltrates.  He was  continued on oxygen 2 liters nasal canula.  He is currently in  respiratory status baseline.  As the patient has been admitted on three  occasions in the last 30 days with COPD exacerbation at this time, we  will plan to discharge the patient on a slow prednisone taper with plans  to continue 10 mg p.o. daily prednisone  after taper is complete.  He has  also been instructed to wear his oxygen 2-3 liters nasal cannula  continuously and to maintain an irregular nebulizer schedule at home.   MEDICATIONS:  At time of discharge,  1. Aspirin 81 mg p.o. daily.  2. Mucinex 600 mg p.o. b.i.d.  3. Lipitor 20 mg p.o. daily.  4. Benazepril 40 mg p.o. daily.  5. Albuterol nebulizers q.4-6 hours while wake.  6. Lasix 20 mg p.o. daily.  7. Prednisone 10 mg tabs 3 tablets twice daily on November 11 and      November 12, 5 tablets once on November 13, 14, 15, then 4 tablets      once on November 16, 17, 18, and 3 tablets once daily on November      19, 20, 21, then two tablets once daily November 22, 23, 24, then      one tablet once daily thereafter.  8. Oxygen 2-3 liters nasal cannula at all times.   PERTINENT LABORATORY DATA:  At time of discharge, hemoglobin 14.7,  hematocrit 42.4, BUN 17, creatinine 1.22.   DISPOSITION:  The patient will be discharged to home.   FOLLOW UP:  The patient will be scheduled to follow up with Dr. Willow Ora  in 1-2 weeks.  Consider referral to pulmonary.  Will defer to the  patient's primary care.      Sandford Craze, NP      Raenette Rover. Felicity Coyer, MD  Electronically Signed    MO/MEDQ  D:  10/04/2007  T:  10/04/2007  Job:  [id number]   cc:   Willow Ora, MD

## 2011-04-08 NOTE — Discharge Summary (Signed)
Jim [name], Jim [name]            ACCOUNT NO.:  [id number]   MEDICAL RECORD NO.:  [id number]          PATIENT TYPE:  INP   LOCATION:  5502                         FACILITY:  [facility]   PHYSICIAN:  Sean A. Everardo All, MD    DATE OF BIRTH:  29-Dec-1931   DATE OF ADMISSION:  09/22/2007  DATE OF DISCHARGE:  09/26/2007                               DISCHARGE SUMMARY   REASON FOR ADMISSION:  Shortness of breath.   HISTORY OF PRESENT ILLNESS:  77 year old man admitted by Dr. Suanne Marker on  September 22, 2007 with a COPD exacerbation.  Please refer to her dictated  history and physical for details.   HOSPITAL COURSE:  Patient was admitted and treated with antibiotics,  nebulizer therapy, and steroids.  He steadily got better.   He was noted to have one positive blood culture for gram-positive cocci.  This returned as a probable contaminant.  Since he had nothing  clinically to correlate with this positive blood culture, his  antibiotics were stopped.  On the morning of September 26, 2007, the  patient was alert, oriented, feeling much better, not short of breath  and eating his usual diet and was thus discharged home in fair  condition.   DISCHARGE DIAGNOSES:  1. Chronic obstructive pulmonary disease exacerbation, better.  2. Positive blood culture, likely contaminant.  3. Other chronic medical problems as noted on the history and      physical.   DISCHARGE MEDICATIONS:  The same as on his dictated history and  physical.   DISCHARGE INSTRUCTIONS:  No restriction diet or exercise.   FOLLOW UP:  Dr. Drue Novel in 2 weeks.      Sean A. Everardo All, MD  Electronically Signed     SAE/MEDQ  D:  09/26/2007  T:  09/26/2007  Job:  [id number]

## 2011-04-11 NOTE — Discharge Summary (Signed)
Jim, [name]            ACCOUNT NO.:  [id number]   MEDICAL RECORD NO.:  [id number]          PATIENT TYPE:  INP   LOCATION:  5714                         FACILITY:  [facility]   PHYSICIAN:  Sandford Craze, NP DATE OF BIRTH:  08-May-1932   DATE OF ADMISSION:  10/05/2006  DATE OF DISCHARGE:  10/08/2006                                 DISCHARGE SUMMARY   DISCHARGE DIAGNOSES:  1. Acute chronic obstructive pulmonary disease exacerbation with hypoxia.  2. Hypertension, uncontrolled.  3. Dyslipidemia.   HISTORY OF PRESENT ILLNESS:  [name] is a 77 year old white male who  presented on 10/05/2006 to the emergency department with a chief complaint  of shortness of breath.  The patient noted an increasing shortness of breath  two days prior to admission.  Was awakening every two hours during the  night, taking treatments of his home Combivent.  This was accompanied by  shortness of breath on exertion, and he presented to the [facility]  Emergency Department for evaluation.  The patient was admitted for further  treatment.   PAST MEDICAL HISTORY:  1. Hypertension.  2. COPD.  3. Hyperlipidemia.   COURSE OF HOSPITALIZATION:  1. Acute COPD exacerbation with hypoxia.  The patient was admitted and was      placed on IV Solu-Medrol.  This was continued for his first and second      days of hospitalization, after which he was transitioned to p.o.      predinsone.  His lung exam remains diminished breath sounds throughout;      however, suspect that this might be the patient's baseline.  He has no      wheezing.  He feels as if he is breathing at his baseline.  He was      noted to have a significant hypoxia during this admission when he was      removed from oxygen.  On ambulation, his O2 sats dropped to 83%.  At      rest, on room air, his O2 sat was 89% to 90%.  This did not improve      despite IV Solu-Medrol and nebulizer treatment.  At this time, we are      making  arrangements for home oxygen.  Consider an outpatient pulmonary      evaluation.  Will defer to patient's primary care.  Also of note during      this admission that CT angio to the chest was performed due to the      patient's hypoxia, and it was negative for pulmonary embolus.  The      patient was also placed on Avelox for questionable underlying      bronchitis.  This will be continued for a total of 7 days.  2. Hypertension.  The patient was maintained on Norvasc and was placed on      Lasix at time of admission.  Lasix 20 mg p.o. daily.  His pressure has      probably been well controlled on his regiment.  He will need a followup      of his electrolytes as an  outpatient.  3. Dyslipidemia.  Patient was noted to be at goal, with a cholesterol of      130, triglycerides 49, HDL 51, LDL 69.  Continue home statin at time of      discharge.   DISCHARGE MEDICATIONS:  1. Prednisone 60 mg p.o. on 10/08/2006, 50 mg p.o. on 11/16 and 11/17, 40      mg p.o. on 11/18 and 11/19, 30 mg p.o. on 11/20 and 11/21, 20 mg p.o.      on 11/22 and 11/23, 10 mg p.o. on 11/24 and 11/25, and then stop.  2. Avelox 400 mg p.o. daily for six more days.  3. Combivent 2 puffs q.6 hours p.r.n.  4. Advair 250/50 one puff twice daily.  5. Spiriva 18 mcg inhalation once daily.  6. Lipitor 20 mg p.o. daily.  7. Lasix 20 mg p.o. daily.  8. Norvasc 10 mg p.o. daily.   PERTINENT LABORATORY DATA AT TIME OF DISCHARGE:  BUN 9, creatinine 0.91.  Sodium 139, potassium 3.9.  BNP 34.8.  Cardiac enzymes negative x2.   FOLLOWUP:  The patient is to follow up with Dr. [name] in one week.  Arrangements are being made for home oxygen, which is to be worn at all  times, and he is instructed not to smoke or have family members smoking in  the house while on oxygen.  He is to call Dr. [name] or go to the emergency  room should he develop fever over 101, worsening shortness of breath, or  weakness.      Sandford Craze,  NP     MO/MEDQ  D:  10/08/2006  T:  10/09/2006  Job:  [id number]   cc:   Vikki Ports A. Felicity Coyer, MD  Willow Ora, MD

## 2011-04-11 NOTE — H&P (Signed)
Jim [name], Jim [name]            ACCOUNT NO.:  [id number]   MEDICAL RECORD NO.:  [id number]          PATIENT TYPE:  EMS   LOCATION:  ED                           FACILITY:  [facility]   PHYSICIAN:  Rosalyn Gess. Norins, MD  DATE OF BIRTH:  Oct 09, 1932   DATE OF ADMISSION:  10/05/2006  DATE OF DISCHARGE:                                HISTORY & PHYSICAL   CHIEF COMPLAINT:  Shortness of breath.   HISTORY OF PRESENT ILLNESS:  Jim [name] is a 77 year old gentleman with a  history of COPD, followed by Dr. Willow Ora.  In the last several months he  has been taken off of Advair 250/50 and switched to Spiriva 18 mcg daily;  with Combivent on a p.r.n. basis.  The patient also reports the use of a  handheld nebulizer at home, either Xopenex or albuterol, he is not sure what  the drug is..  The patient reports that he has been at his baseline, but has  had some problems over the last 2 days with increasing shortness of breath.  He reports awakening every 2 hours during the night, short of breath and  taking treatments with his Combivent.  He also reports he has had  significant limitation in activity, in only being able to walk 20 feet  without getting short of breath.  Because of progressive symptoms, he  presents to the [facility] Emergency Department for evaluation.  On  presentation he evidently was felt to be short of breath and wheezing.  He  was given 2 breathing treatments and Solu-Medrol 125 mg IV prior to being  seen by the admitting team.  At the time he was seen, the patient continues  to be short of breath, tachycardic, O2 saturations at  91% on 2 liters.  He  had been taken off to room air and dropped to 88% O2 saturations.  Patient  is now admitted for 24-hour observation to stabilize his COPD.   PAST MEDICAL HISTORY:   SURGICAL:  None.   MEDICAL:  1. Hypertension.  2. COPD.  3. Hyperlipidemia.   CURRENT MEDICATIONS:  1. Combivent p.r.n.  2. Lipitor 20 mg daily.  3. Norvasc  10 mg daily.  4. Aspirin 81 mg daily.  5. Viagra p.r.n.  6. Spiriva 18 mcg, 1 inhalation q.d.   ALLERGIES:  THE PATIENT HAS NO KNOWN DRUG ALLERGIES.   FAMILY HISTORY:  Significant for father having died of a brain hemorrhage.  Mother is deceased and had dementia.  The patient has one brother who is  deceased.  One brother has cirrhosis of the liver.  The brother who died had  lymphoma.  The patient has no family history of prostate or colon cancer,  diabetes or coronary artery disease.   SOCIAL HISTORY:  The patient is single.  He has 2 children.  He is  unemployed, on disability and retirement.  The patient is intermittently on  tobacco, currently off tobacco.  He is a rare drinker and has no known drug  allergies.   REVIEW OF SYSTEMS:  The patient had no fevers or chills.  He denies  any  cardiovascular or GI complaints.   PHYSICAL EXAMINATION:  VITAL SIGNS:  Temperature 97, blood pressure 171/82,  heart rate 83, respirations 22, O2 saturation 92% on 2 liters.  GENERAL APPEARANCE:  This is a chronically ill elderly gentleman, looking  older than his stated chronologic age; with the appearance of chronic  COPDer.  HEENT: Exam was unremarkable.  NECK:  Supple.  CHEST:  The patient has decreased breath sounds throughout.  I did not  appreciate any wheezing.  No rhonchi were noted.  No rales were noted.  CARDIOVASCULAR:  Patient had 2+ radial pulses.  Precordium was quiet.  He  had a regular tachycardia.  I appreciated no bruits or murmurs.  ABDOMEN:  Soft; no guarding or rebound was noted.  GENITALIA/RECTAL:  Exams deferred to Dr. Drue Novel  EXTREMITIES:  Without clubbing, cyanosis, edema or deformity.  NEUROLOGIC:  Exam was grossly intact.   DATA BASE:  BNP 34.8.  Point-of-care cardiac enzymes were negative x2.  Sodium 139, potassium 3.9, chloride 104, CO2 26, BUN 9, creatinine 0.9.  Chest x-ray showed chronic changes of COPD with no infiltrates or effusions.   ASSESSMENT/PLAN:  1.  PULMONARY:  Patient with COPD with an asthmatic component.  He had been      on Advair; currently is on Spiriva, along with Combivent p.r.n.  He is      clearly having an exacerbation with increased use of his Combivent      metered dose inhaler, and clearly was short of breath on arrival at the      hospital.  PLAN:  Will give 2 additional doses of Solu-Medrol at 8-hour      intervals, and then convert to prednisone 30 mg b.i.d. Will resume the      patient's Advair 250/50 for the beneficial effect on inflammation of      the steroid and bronchodilation with the albuterol.  He will be      continued on Spiriva.  The patient will be continued on O2 overnight.      If the patient stabilizes and can tolerate p.o. prednisone, he will be      discharged home in 23 hours.  2. HYPERTENSION.  Patient is poorly controlled.  At this time will      continue Norvasc 10 mg p.o. q. day.  Will have Lasix 20 mg q.d.  3. LIPIDS.  Was not able to locate the follow-up lipid panel in E chart.      PLAN:  Continue Lipitor 20 mg q.d..  Will get an a.m. lipid panel.      Rosalyn Gess Norins, MD  Electronically Signed     MEN/MEDQ  D:  10/05/2006  T:  10/05/2006  Job:  [id number]   cc:   Willow Ora, MD  [phone] [address] [place], [place] [address]

## 2011-04-16 ENCOUNTER — Other Ambulatory Visit: Payer: Self-pay | Admitting: Internal Medicine

## 2011-05-06 ENCOUNTER — Other Ambulatory Visit: Payer: Self-pay | Admitting: Internal Medicine

## 2011-06-23 ENCOUNTER — Ambulatory Visit: Payer: Self-pay | Admitting: Pulmonary Disease

## 2011-06-25 ENCOUNTER — Ambulatory Visit (INDEPENDENT_AMBULATORY_CARE_PROVIDER_SITE_OTHER): Payer: Medicare Other | Admitting: Pulmonary Disease

## 2011-06-25 ENCOUNTER — Encounter: Payer: Self-pay | Admitting: Pulmonary Disease

## 2011-06-25 VITALS — BP 148/86 | HR 85 | Temp 98.6°F | Ht 68.5 in | Wt 175.6 lb

## 2011-06-25 DIAGNOSIS — J438 Other emphysema: Secondary | ICD-10-CM

## 2011-06-25 NOTE — Patient Instructions (Signed)
Continue on current meds. Try to stay as active as possible. followup with me in 6mos, or sooner if having problems.

## 2011-06-25 NOTE — Progress Notes (Signed)
  Subjective:    Patient ID: Jim [name], male    DOB: 1932/04/01, 77 y.o.   MRN: [id number]  HPI The pt comes in today for f/u of his copd.  He is staying on his symbicort with as needed combivent, and feels he is doing well.  Denies any recent flare or pulmonary infection.  He is staying active, and feels his doe is at baseline   Review of Systems  Constitutional: Negative for fever and unexpected weight change.  HENT: Negative for ear pain, nosebleeds, congestion, sore throat, rhinorrhea, sneezing, trouble swallowing, dental problem, postnasal drip and sinus pressure.   Eyes: Positive for itching. Negative for redness.  Respiratory: Negative for cough, chest tightness, shortness of breath and wheezing.   Cardiovascular: Negative for palpitations and leg swelling.  Gastrointestinal: Negative for nausea and vomiting.  Genitourinary: Negative for dysuria.  Musculoskeletal: Negative for joint swelling.  Skin: Negative for rash.  Neurological: Negative for headaches.  Hematological: Bruises/bleeds easily.  Psychiatric/Behavioral: Negative for dysphoric mood. The patient is not nervous/anxious.        Objective:   Physical Exam Wd male in nad.   Chest with decreased bs, but no wheezing or rhonchi Cor with rrr LE without edema, no cyanosis Alert, oriented, moves all 4        Assessment & Plan:

## 2011-06-26 ENCOUNTER — Other Ambulatory Visit: Payer: Self-pay | Admitting: Internal Medicine

## 2011-06-30 ENCOUNTER — Encounter: Payer: Self-pay | Admitting: Pulmonary Disease

## 2011-06-30 NOTE — Assessment & Plan Note (Signed)
The pt is doing well from a pulmonary standpoint. I have asked him to continue on his current regimen, and to stay as active as possible.

## 2011-07-02 ENCOUNTER — Other Ambulatory Visit: Payer: Self-pay | Admitting: Internal Medicine

## 2011-07-21 ENCOUNTER — Other Ambulatory Visit: Payer: Self-pay | Admitting: Internal Medicine

## 2011-07-23 ENCOUNTER — Encounter: Payer: Self-pay | Admitting: Internal Medicine

## 2011-07-23 ENCOUNTER — Ambulatory Visit (INDEPENDENT_AMBULATORY_CARE_PROVIDER_SITE_OTHER): Payer: Medicare Other | Admitting: Internal Medicine

## 2011-07-23 DIAGNOSIS — E119 Type 2 diabetes mellitus without complications: Secondary | ICD-10-CM

## 2011-07-23 DIAGNOSIS — I10 Essential (primary) hypertension: Secondary | ICD-10-CM

## 2011-07-23 DIAGNOSIS — E785 Hyperlipidemia, unspecified: Secondary | ICD-10-CM

## 2011-07-23 DIAGNOSIS — Z0000 Encounter for general adult medical examination without abnormal findings: Secondary | ICD-10-CM | POA: Insufficient documentation

## 2011-07-23 LAB — CBC WITH DIFFERENTIAL/PLATELET
Basophils Absolute: 0.1 10*3/uL (ref 0.0–0.1)
Eosinophils Absolute: 0.4 10*3/uL (ref 0.0–0.7)
HCT: 47.6 % (ref 39.0–52.0)
Hemoglobin: 15.9 g/dL (ref 13.0–17.0)
Lymphs Abs: 1.7 10*3/uL (ref 0.7–4.0)
MCHC: 33.4 g/dL (ref 30.0–36.0)
MCV: 95.9 fl (ref 78.0–100.0)
Monocytes Absolute: 0.6 10*3/uL (ref 0.1–1.0)
Neutro Abs: 6 10*3/uL (ref 1.4–7.7)
RDW: 13.6 % (ref 11.5–14.6)

## 2011-07-23 LAB — LIPID PANEL
LDL Cholesterol: 86 mg/dL (ref 0–99)
VLDL: 26 mg/dL (ref 0.0–40.0)

## 2011-07-23 LAB — COMPREHENSIVE METABOLIC PANEL
ALT: 17 U/L (ref 0–53)
AST: 19 U/L (ref 0–37)
Alkaline Phosphatase: 95 U/L (ref 39–117)
CO2: 29 mEq/L (ref 19–32)
Creatinine, Ser: 0.9 mg/dL (ref 0.4–1.5)
GFR: 86.97 mL/min (ref >60.00)
Total Bilirubin: 1.2 mg/dL (ref 0.3–1.2)

## 2011-07-23 LAB — TSH: TSH: 0.87 u[IU]/mL (ref 0.35–5.50)

## 2011-07-23 MED ORDER — ZOSTER VACCINE LIVE 19400 UNT/0.65ML ~~LOC~~ SOLR: 0.6500 mL | Freq: Once | SUBCUTANEOUS | Status: DC

## 2011-07-23 MED ORDER — LOSARTAN POTASSIUM 100 MG PO TABS: 100.0000 mg | ORAL_TABLET | Freq: Every day | ORAL | Status: DC

## 2011-07-23 NOTE — Assessment & Plan Note (Signed)
Diet controlled, labs Feet care discussed, nails too long Does see ophthalmology yearly

## 2011-07-23 NOTE — Assessment & Plan Note (Addendum)
CHART REVIEWED  Td 09 pneumonia shot 2005 and 11--2010 H/o shingles years ago, zostavax discussed , Rx provided  last colonoscopy 06/2009, next in 5 years   PSA per urology  Encourage to continue his healthy lifestyle

## 2011-07-23 NOTE — Patient Instructions (Addendum)
Stop benazepril, start losartan  Came back in 6 weeks for a office visit  Check the  blood pressure 2 or 3 times a week, be sure it is less than 140/85. If it is consistently higher, let me know

## 2011-07-23 NOTE — Assessment & Plan Note (Signed)
Reports good med compliance, labs  

## 2011-07-23 NOTE — Assessment & Plan Note (Signed)
Well controlled? H/o edema w/ amlodipine  Change to losartan, See instructions

## 2011-07-23 NOTE — Progress Notes (Signed)
  Subjective:    Patient ID: Jim [name], male    DOB: [birth year], 77 y.o.   MRN: [id number]  HPI Here for Medicare AWV: 1. Risk factors based on Past M, S, F history: reviewed 2. Physical Activities:  Yard work, active, no routine exercise  3. Depression/mood: No problems noted or reported 4. Hearing:  No problemss noted or reported  5. ADL's: still drives, independent   6. Fall Risk: average risk 7. home Safety: does feel safe at home  8. Height, weight, &visual acuity: see VS, vision normal, sees eye doctor once a year (Dr Dione Booze), s/p cataracts surgery 9. Counseling: provided 10. Labs ordered based on risk factors: if needed  11. Referral Coordination: if needed 12.  Care Plan, see assessment and plan  13.   Cognitive Assessment: Cognition and motor skills seem appropriate   In addition, today we discussed the following: DM-- no amb CBGs, does watch his diet! COPD-- saw pulmonary recently recently, note reviewed, stable HTN--no recent amb BPs but when check is "high normal"  Past Medical History  Diagnosis Date  . DM (diabetes mellitus) 10/2009  . 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 Abnormal chest xray     last XR 11-2010, no further  f/u CXRs  . Syncope 2010    saw cards   . Hyperlipidemia   . Hypertension   . Prostate cancer 2008    s/p XRT  . ED (erectile dysfunction)     Past Surgical History  Procedure Date  . Cataract extraction     bilaterally     Family History: prostate  ca-- no colon ca-- no Brothers-- has a history of cirrhosis (liver) CAD-- brother at age 52s DM--no  Mother had dementia father had a brain hemorrhage.    Social History: Single, lives w/ son two children Retired independent on ADL, does yard work Patient states former smoker. quit 2007. ETOH-- rarely  Review of Systems  Constitutional: Negative for fever, fatigue and unexpected weight change.    Respiratory: Negative for shortness of breath. Cough: occ in AM, some mucus, no hemoptysis.   Cardiovascular: Negative for chest pain and leg swelling.  Gastrointestinal: Negative for abdominal pain and blood in stool.  Genitourinary: Negative for hematuria and difficulty urinating.       Objective:   Physical Exam  Constitutional: He is oriented to person, place, and time. He appears well-developed and well-nourished. No distress.  HENT:  Head: Normocephalic and atraumatic.  Neck: No thyromegaly present.       Nl carotid pulse   Cardiovascular: Normal rate, regular rhythm and normal heart sounds.   No murmur heard. Pulmonary/Chest: Effort normal. No respiratory distress. He has no wheezes. He has no rales.       Decreased BS  Abdominal: Soft. He exhibits no distension. There is no tenderness. There is no rebound and no guarding.  Musculoskeletal:       DIABETIC FEET EXAM: No lower extremity edema Normal pedal pulses bilaterally Skin   normal without calluses Nails thick and too long  Pinprick examination of the feet normal.   Neurological: He is alert and oriented to person, place, and time.  Skin: He is not diaphoretic.  Psychiatric: He has a normal mood and affect. His behavior is normal. Judgment and thought content normal.          Assessment & Plan:

## 2011-07-30 ENCOUNTER — Other Ambulatory Visit: Payer: Self-pay | Admitting: Internal Medicine

## 2011-08-20 ENCOUNTER — Telehealth: Payer: Self-pay | Admitting: Internal Medicine

## 2011-08-20 MED ORDER — CARVEDILOL 6.25 MG PO TABS: 6.2500 mg | ORAL_TABLET | Freq: Two times a day (BID) | ORAL | Status: DC

## 2011-08-20 NOTE — Telephone Encounter (Signed)
I received a note from the patient, blood pressure varies from 120s over 70-179/91. Add Coreg 6.25 bid, Rx sent  Call pt: ask to add coreg, bring BP readings in 3 weeks

## 2011-08-20 NOTE — Telephone Encounter (Signed)
Patient informed. 

## 2011-08-23 ENCOUNTER — Other Ambulatory Visit: Payer: Self-pay | Admitting: Internal Medicine

## 2011-08-25 NOTE — Telephone Encounter (Signed)
Pt last seen on 07/23/11, follow up on 09/03/11.  30 day supply sent to pharm.

## 2011-09-02 LAB — CBC
HCT: 40.1
HCT: 42.4
Hemoglobin: 13.6
Hemoglobin: 14.7
MCHC: 34.7
MCV: 91.4
Platelets: 336
RDW: 12.6
WBC: 13.1 — ABNORMAL HIGH

## 2011-09-02 LAB — COMPREHENSIVE METABOLIC PANEL
ALT: 19
BUN: 17
CO2: 23
Calcium: 8.9
Creatinine, Ser: 1.22
GFR calc non Af Amer: 58 — ABNORMAL LOW
Glucose, Bld: 161 — ABNORMAL HIGH
Sodium: 135
Total Protein: 6.8

## 2011-09-02 LAB — BASIC METABOLIC PANEL
Chloride: 105
GFR calc non Af Amer: >60
Glucose, Bld: 121 — ABNORMAL HIGH
Potassium: 4.1
Sodium: 136

## 2011-09-02 LAB — CK TOTAL AND CKMB (NOT AT ARMC): Relative Index: 3.1 — ABNORMAL HIGH

## 2011-09-02 LAB — URINALYSIS, ROUTINE W REFLEX MICROSCOPIC
Glucose, UA: NEGATIVE
Protein, ur: NEGATIVE
Specific Gravity, Urine: 1.03
Urobilinogen, UA: 0.2

## 2011-09-02 LAB — DIFFERENTIAL
Basophils Relative: 1
Eosinophils Absolute: 1.6 — ABNORMAL HIGH
Lymphs Abs: 3.4 — ABNORMAL HIGH
Neutro Abs: 5.3
Neutrophils Relative %: 46

## 2011-09-02 LAB — B-NATRIURETIC PEPTIDE (CONVERTED LAB): Pro B Natriuretic peptide (BNP): <30

## 2011-09-02 LAB — HEMOGLOBIN A1C
Hgb A1c MFr Bld: 6.7 — ABNORMAL HIGH
Mean Plasma Glucose: 161

## 2011-09-02 LAB — POCT CARDIAC MARKERS: Operator id: 284251

## 2011-09-02 LAB — TROPONIN I: Troponin I: 0.02

## 2011-09-03 ENCOUNTER — Encounter: Payer: Self-pay | Admitting: Internal Medicine

## 2011-09-03 ENCOUNTER — Ambulatory Visit (INDEPENDENT_AMBULATORY_CARE_PROVIDER_SITE_OTHER): Payer: Medicare Other | Admitting: Internal Medicine

## 2011-09-03 VITALS — BP 128/70 | HR 85 | Temp 98.8°F | Resp 16 | Wt 175.1 lb

## 2011-09-03 DIAGNOSIS — Z23 Encounter for immunization: Secondary | ICD-10-CM

## 2011-09-03 DIAGNOSIS — I10 Essential (primary) hypertension: Secondary | ICD-10-CM

## 2011-09-03 LAB — DIFFERENTIAL
Basophils Absolute: 0.1
Lymphocytes Relative: 20
Neutro Abs: 5.4
Neutrophils Relative %: 62

## 2011-09-03 LAB — I-STAT 8, (EC8 V) (CONVERTED LAB)
BUN: 10
Chloride: 109
HCT: 44
Operator id: 234501
pCO2, Ven: 28.7 — ABNORMAL LOW
pH, Ven: 7.472 — ABNORMAL HIGH

## 2011-09-03 LAB — CBC
Platelets: 225
RDW: 12.8

## 2011-09-03 LAB — CULTURE, BLOOD (ROUTINE X 2)

## 2011-09-03 LAB — POCT CARDIAC MARKERS
CKMB, poc: 3.3
Myoglobin, poc: 116
Operator id: 234501

## 2011-09-03 LAB — B-NATRIURETIC PEPTIDE (CONVERTED LAB): Pro B Natriuretic peptide (BNP): 155 — ABNORMAL HIGH

## 2011-09-03 MED ORDER — CARVEDILOL 12.5 MG PO TABS: 12.5000 mg | ORAL_TABLET | Freq: Two times a day (BID) | ORAL | Status: DC

## 2011-09-03 NOTE — Assessment & Plan Note (Signed)
Since last OV he switched to losartan, as his BP was still elevated w/ added coreg: BP still slt high in AM.no apparent s/e Plan: increase coreg to 12.5 bid

## 2011-09-03 NOTE — Patient Instructions (Addendum)
Call with your BP readings in 3 weeks

## 2011-09-03 NOTE — Assessment & Plan Note (Signed)
Flu shot today 

## 2011-09-03 NOTE — Progress Notes (Signed)
  Subjective:    Patient ID: Jim [name], male    DOB: 1932-03-17, 77 y.o.   MRN: [id number]  HPI Hypertension followup, he was switch from ACE inhibitors to losartan, BP remain elevated, he was added Coreg. Good compliance. Blood pressure in the morning varies from 178/75-160/90. In the afternoon, blood pressure is better around 130/80, 150/80.  Past Medical History  Diagnosis Date  . DM (diabetes mellitus) 10/2009  . 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 Abnormal chest xray     last XR 11-2010, no further  f/u CXRs  . Syncope 2010    saw cards   . Hyperlipidemia   . Hypertension   . Prostate cancer 2008    s/p XRT  . ED (erectile dysfunction)    Past Surgical History  Procedure Date  . Cataract extraction     bilaterally     Review of Systems Good compliance with all medicines. He takes Coreg twice a day as prescribed and losartan at around 2 PM  Denies any lower extremity edema or cough. No wheezing.     Objective:   Physical Exam  Constitutional: He is oriented to person, place, and time. He appears well-developed and well-nourished.  HENT:  Head: Normocephalic and atraumatic.  Cardiovascular: Normal rate, regular rhythm and normal heart sounds.   No murmur heard. Pulmonary/Chest: Effort normal and breath sounds normal. No respiratory distress. He has no wheezes. He has no rales.  Neurological: He is alert and oriented to person, place, and time.          Assessment & Plan:

## 2011-09-04 LAB — DIFFERENTIAL
Eosinophils Absolute: 1.1 — ABNORMAL HIGH
Eosinophils Relative: 10 — ABNORMAL HIGH
Lymphs Abs: 2
Monocytes Absolute: 0.6
Monocytes Relative: 6

## 2011-09-04 LAB — BASIC METABOLIC PANEL
BUN: 11
Calcium: 9.3
Chloride: 107
Creatinine, Ser: 1.06
GFR calc Af Amer: >60
GFR calc non Af Amer: >60

## 2011-09-04 LAB — I-STAT 8, (EC8 V) (CONVERTED LAB)
BUN: 10
Chloride: 108
HCT: 44
Hemoglobin: 15
Operator id: 133351
Potassium: 3.7
Sodium: 141

## 2011-09-04 LAB — CBC
HCT: 43
MCV: 91.8
MCV: 92.3
Platelets: 243
Platelets: 262
RBC: 4.77
WBC: 10.7 — ABNORMAL HIGH
WBC: 6.4

## 2011-09-04 LAB — CULTURE, BLOOD (ROUTINE X 2): Culture: NO GROWTH

## 2011-09-04 LAB — POCT I-STAT CREATININE
Creatinine, Ser: 1
Operator id: 133351

## 2011-09-22 ENCOUNTER — Other Ambulatory Visit: Payer: Self-pay | Admitting: Internal Medicine

## 2011-10-05 ENCOUNTER — Telehealth: Payer: Self-pay | Admitting: Internal Medicine

## 2011-10-05 NOTE — Telephone Encounter (Signed)
Note from pt, amb BPs for last few of October-----> 120-130/60-70 Advise patient: Good  readings, continue same meds

## 2011-10-06 NOTE — Telephone Encounter (Signed)
Pt notified amb BP is doing better and to continue the current medications

## 2011-10-06 NOTE — Telephone Encounter (Signed)
Pt notified amb BP is doing better and to continue the current medications, Done

## 2011-10-07 ENCOUNTER — Other Ambulatory Visit: Payer: Self-pay | Admitting: Internal Medicine

## 2011-10-07 NOTE — Telephone Encounter (Signed)
Done

## 2011-10-21 ENCOUNTER — Other Ambulatory Visit: Payer: Self-pay | Admitting: Pulmonary Disease

## 2011-11-01 DIAGNOSIS — Z0289 Encounter for other administrative examinations: Secondary | ICD-10-CM

## 2011-11-13 ENCOUNTER — Other Ambulatory Visit: Payer: Self-pay | Admitting: Internal Medicine

## 2011-11-13 MED ORDER — ALBUTEROL SULFATE (2.5 MG/3ML) 0.083% IN NEBU: 2.5000 mg | INHALATION_SOLUTION | Freq: Every day | RESPIRATORY_TRACT | Status: DC | PRN

## 2011-11-13 NOTE — Telephone Encounter (Signed)
Rx faxed to [facility].

## 2011-11-14 NOTE — Telephone Encounter (Signed)
Addended by: Candie Echevaria L on: 11/14/2011 12:17 PM   Modules accepted: Orders

## 2011-12-27 ENCOUNTER — Other Ambulatory Visit: Payer: Self-pay | Admitting: Internal Medicine

## 2011-12-29 NOTE — Telephone Encounter (Signed)
Refill done.  

## 2011-12-31 ENCOUNTER — Ambulatory Visit (INDEPENDENT_AMBULATORY_CARE_PROVIDER_SITE_OTHER): Payer: Medicare Other | Admitting: Pulmonary Disease

## 2011-12-31 ENCOUNTER — Encounter: Payer: Self-pay | Admitting: Pulmonary Disease

## 2011-12-31 VITALS — BP 190/92 | HR 71 | Temp 97.9°F | Ht 68.5 in | Wt 178.0 lb

## 2011-12-31 DIAGNOSIS — J438 Other emphysema: Secondary | ICD-10-CM

## 2011-12-31 NOTE — Progress Notes (Signed)
  Subjective:    Patient ID: Jim [name], male    DOB: 1932/09/27, 78 y.o.   MRN: [id number]  HPI The patient comes in today for followup of his known emphysema.  He is compliant with symbicort, and is using Combivent for rescue as needed.  He feels that his breathing is at baseline, and denies any recent chest colds or acute exacerbations.  He has a minimal cough primarily in the mornings it is productive of white mucus.   Review of Systems  Constitutional: Negative for fever and unexpected weight change.  HENT: Negative for ear pain, nosebleeds, congestion, sore throat, rhinorrhea, sneezing, trouble swallowing, dental problem, postnasal drip and sinus pressure.   Eyes: Negative for redness and itching.  Respiratory: Negative for cough, chest tightness, shortness of breath and wheezing.   Cardiovascular: Negative for palpitations and leg swelling.  Gastrointestinal: Negative for nausea and vomiting.  Genitourinary: Negative for dysuria.  Musculoskeletal: Negative for joint swelling.  Skin: Negative for rash.  Neurological: Negative for headaches.  Hematological: Does not bruise/bleed easily.  Psychiatric/Behavioral: Negative for dysphoric mood. The patient is not nervous/anxious.        Objective:   Physical Exam Well-developed male in no acute distress Nose without purulence or discharge noted Chest with decreased breath sounds throughout, no wheezes or rhonchi Cardiac exam with distant heart sounds but regular Lower extremities no edema, no cyanosis noted Alert and oriented, moves all 4 extremities.       Assessment & Plan:

## 2011-12-31 NOTE — Patient Instructions (Signed)
No change in breathing medications Stay as active as possible Take your blood pressure at home, and let Dr. Drue Novel know if top number stays more than 160 or bottom number is greater than 95.   followup with me in 6mos, but call if having breathing issues.

## 2011-12-31 NOTE — Assessment & Plan Note (Signed)
The patient has moderate to severe airflow obstruction probably secondary to emphysema.  He has maintained on symbicort, and has done fairly well with this.  He feels that his exertional tolerance is at baseline, and has not had any recent exacerbations by his history.  I would like to maintain him on his current medications, but could add Spiriva to his regimen if he has worsening symptoms.  I've asked him to stay as active as possible, and to followup with me in 6 months.

## 2012-01-05 ENCOUNTER — Ambulatory Visit: Payer: Medicare Other | Admitting: Internal Medicine

## 2012-01-08 ENCOUNTER — Encounter: Payer: Self-pay | Admitting: Internal Medicine

## 2012-01-08 ENCOUNTER — Ambulatory Visit (INDEPENDENT_AMBULATORY_CARE_PROVIDER_SITE_OTHER): Payer: Medicare Other | Admitting: Internal Medicine

## 2012-01-08 VITALS — BP 140/80 | HR 79 | Temp 98.3°F | Wt 174.0 lb

## 2012-01-08 DIAGNOSIS — J438 Other emphysema: Secondary | ICD-10-CM | POA: Diagnosis not present

## 2012-01-08 DIAGNOSIS — E119 Type 2 diabetes mellitus without complications: Secondary | ICD-10-CM

## 2012-01-08 DIAGNOSIS — I10 Essential (primary) hypertension: Secondary | ICD-10-CM | POA: Diagnosis not present

## 2012-01-08 LAB — BASIC METABOLIC PANEL
CO2: 27 mEq/L (ref 19–32)
Calcium: 8.8 mg/dL (ref 8.4–10.5)
GFR: 82.61 mL/min (ref >60.00)
Potassium: 3.9 mEq/L (ref 3.5–5.1)
Sodium: 141 mEq/L (ref 135–145)

## 2012-01-08 MED ORDER — CARVEDILOL 25 MG PO TABS: 25.0000 mg | ORAL_TABLET | Freq: Two times a day (BID) | ORAL | Status: DC

## 2012-01-08 NOTE — Patient Instructions (Signed)
Please increase carvedilol to 25 mg 1 tablet twice a day , a new prescription was sent Continue checking  the  blood pressure 2 or 3 times a week, be sure it is less than 140/85. If it is consistently higher, let me know

## 2012-01-08 NOTE — Assessment & Plan Note (Signed)
See previous assesment, we added coreg, tolerates well , amb SBPs slt elevated; BP at Pulmonary in the 190. Plan: increase coreg to 25 bid  See instructions

## 2012-01-08 NOTE — Assessment & Plan Note (Signed)
Diet controlled, due for labs

## 2012-01-08 NOTE — Progress Notes (Signed)
  Subjective:    Patient ID: Jim [name], male    DOB: [birth year], 78 y.o.   MRN: [id number]  HPI Routine office visit Hypertension, good medication compliance, the patient brought a number of readings, blood pressures in the morning  usually higher than the rest of the days, systolics in the 160-170. Diastolic BPs always within normal. COPD, note from Dr Shelle Iron reviewed, she is stable. His blood pressure at that particular office visit was in the 190s. Diabetes, due for labs, diet controlled   Past Medical History  Diagnosis Date  . DM (diabetes mellitus) 10/2009  . 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 Abnormal chest xray     last XR 11-2010, no further  f/u CXRs  . Syncope 2010    saw cards   . Hyperlipidemia   . Hypertension   . Prostate cancer 2008    s/p XRT  . ED (erectile dysfunction)    Past Surgical History  Procedure Date  . Cataract extraction     bilaterally     Review of Systems No chest pain , shortness of breath is mild at baseline. No nausea, vomiting, diarrhea.     Objective:   Physical Exam  Constitutional: He is oriented to person, place, and time. He appears well-developed and well-nourished. No distress.  Cardiovascular: Normal rate, regular rhythm and normal heart sounds.  Exam reveals no gallop and no friction rub.   No murmur heard. Pulmonary/Chest: Effort normal and breath sounds normal. No respiratory distress. He has no rales.  Musculoskeletal: He exhibits no edema.  Neurological: He is alert and oriented to person, place, and time.  Skin: He is not diaphoretic.  Psychiatric: He has a normal mood and affect. His behavior is normal. Judgment and thought content normal.      Assessment & Plan:

## 2012-01-08 NOTE — Assessment & Plan Note (Signed)
Well controlled 

## 2012-01-14 MED ORDER — METFORMIN HCL 500 MG PO TABS: 500.0000 mg | ORAL_TABLET | Freq: Two times a day (BID) | ORAL | Status: DC

## 2012-01-14 NOTE — Progress Notes (Signed)
Addended by: Edwena Felty T on: 01/14/2012 04:15 PM   Modules accepted: Orders

## 2012-01-15 ENCOUNTER — Other Ambulatory Visit: Payer: Self-pay | Admitting: Internal Medicine

## 2012-01-15 NOTE — Telephone Encounter (Signed)
Refill done.  

## 2012-01-28 ENCOUNTER — Other Ambulatory Visit: Payer: Self-pay | Admitting: Internal Medicine

## 2012-01-28 NOTE — Telephone Encounter (Signed)
Refill done.  

## 2012-02-12 ENCOUNTER — Other Ambulatory Visit: Payer: Self-pay | Admitting: Pulmonary Disease

## 2012-03-13 ENCOUNTER — Other Ambulatory Visit: Payer: Self-pay | Admitting: Internal Medicine

## 2012-03-15 ENCOUNTER — Telehealth: Payer: Self-pay | Admitting: Internal Medicine

## 2012-03-15 NOTE — Telephone Encounter (Signed)
Refill: Albuterol 0.083% 3 mL unit dose. QID prn 90 day supply

## 2012-03-15 NOTE — Telephone Encounter (Signed)
Refill done.  

## 2012-03-16 ENCOUNTER — Telehealth: Payer: Self-pay | Admitting: *Deleted

## 2012-03-16 MED ORDER — ALBUTEROL SULFATE (2.5 MG/3ML) 0.083% IN NEBU: 2.5000 mg | INHALATION_SOLUTION | Freq: Four times a day (QID) | RESPIRATORY_TRACT | Status: DC | PRN

## 2012-03-16 NOTE — Telephone Encounter (Signed)
Geisha from [facility] needed to clarify refill request, transferred call to [facility],

## 2012-03-16 NOTE — Telephone Encounter (Signed)
Refill done.  

## 2012-04-02 ENCOUNTER — Other Ambulatory Visit: Payer: Self-pay | Admitting: Internal Medicine

## 2012-04-02 NOTE — Telephone Encounter (Signed)
Refill done.  

## 2012-04-12 ENCOUNTER — Encounter: Payer: Self-pay | Admitting: Internal Medicine

## 2012-04-12 ENCOUNTER — Ambulatory Visit (INDEPENDENT_AMBULATORY_CARE_PROVIDER_SITE_OTHER): Payer: Medicare Other | Admitting: Internal Medicine

## 2012-04-12 VITALS — BP 148/88 | HR 60 | Temp 97.6°F | Wt 163.0 lb

## 2012-04-12 DIAGNOSIS — I10 Essential (primary) hypertension: Secondary | ICD-10-CM

## 2012-04-12 DIAGNOSIS — J438 Other emphysema: Secondary | ICD-10-CM | POA: Diagnosis not present

## 2012-04-12 DIAGNOSIS — E119 Type 2 diabetes mellitus without complications: Secondary | ICD-10-CM

## 2012-04-12 LAB — ALT: ALT: 12 U/L (ref 0–53)

## 2012-04-12 LAB — HEMOGLOBIN A1C: Hgb A1c MFr Bld: 6.3 % (ref 4.6–6.5)

## 2012-04-12 LAB — AST: AST: 13 U/L (ref 0–37)

## 2012-04-12 NOTE — Assessment & Plan Note (Signed)
Well-controlled?. BP today 140/88, reports good ambulatory blood pressures but has no readings. Intolerant to amlodipine. Will ask him to come back in 2 weeks for a BP check. No charge.

## 2012-04-12 NOTE — Patient Instructions (Signed)
Check the  blood pressure 2 or 3 times a week, be sure it is between 110/60 and 140/85. If it is consistently higher or lower, let me know In 2 weeks come back to the office for a BP check, call Lillia Abed my nurse 2 days ahead to check what time is convenient for the BP check

## 2012-04-12 NOTE — Progress Notes (Signed)
  Subjective:    Patient ID: Jim [name], male    DOB: [birth year], 78 y.o.   MRN: [id number]  HPI ROV Diabetes, good medication compliance, no ambulatory blood sugars Hypertension, good medication compliance, checks his BP from time to time, tells me BPs are normal but he could not tell me what readings he is getting. COPD, thinks he is doing great, takes medication appropriately, uses nebulizers on average 2 times a week. Uses oxygen some nights he does not feel he needs it.  Past Medical History  Diagnosis Date  . DM (diabetes mellitus) 10/2009  . 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 Abnormal chest xray     last XR 11-2010, no further  f/u CXRs  . Syncope 2010    saw cards   . Hyperlipidemia   . Hypertension   . Prostate cancer 2008    s/p XRT  . ED (erectile dysfunction)     Past Medical History  Diagnosis Date  . DM (diabetes mellitus) 10/2009  . 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 Abnormal chest xray     last XR 11-2010, no further  f/u CXRs  . Syncope 2010    saw cards   . Hyperlipidemia   . Hypertension   . Prostate cancer 2008    s/p XRT  . ED (erectile dysfunction)     Review of Systems No chest pain, shortness of breath at baseline. Occasionally sputum production in the morning, no hemoptysis. No nausea, vomiting, diarrhea or blood in the stools.     Objective:   Physical Exam General -- alert, well-developed, and well-nourished.   Lungs -- normal respiratory effort, no intercostal retractions, no accessory muscle use, and decreased  breath sounds.   Heart-- normal rate, regular rhythm, no murmur, and no gallop.    Extremities-- no pretibial edema bilaterally Neurologic-- alert & oriented X3 and strength normal in all extremities. Psych-- Cognition and judgment appear intact. Alert and cooperative with normal  attention span and concentration.  not anxious appearing and not depressed appearing.      Assessment & Plan:

## 2012-04-12 NOTE — Assessment & Plan Note (Addendum)
Rx metformin few months ago and seems to be tolerating well. Check a hemoglobin A1c and LFTs.

## 2012-04-12 NOTE — Assessment & Plan Note (Addendum)
Seems to be doing great, takes oxygen at night only ~ 3 times a week.

## 2012-04-13 ENCOUNTER — Encounter: Payer: Self-pay | Admitting: *Deleted

## 2012-04-22 DIAGNOSIS — H26499 Other secondary cataract, unspecified eye: Secondary | ICD-10-CM | POA: Diagnosis not present

## 2012-04-22 DIAGNOSIS — H3530 Unspecified macular degeneration: Secondary | ICD-10-CM | POA: Diagnosis not present

## 2012-04-22 DIAGNOSIS — Z961 Presence of intraocular lens: Secondary | ICD-10-CM | POA: Diagnosis not present

## 2012-04-27 ENCOUNTER — Telehealth: Payer: Self-pay | Admitting: Internal Medicine

## 2012-04-27 NOTE — Telephone Encounter (Signed)
Done

## 2012-04-27 NOTE — Telephone Encounter (Signed)
Pharmacy comments: Combivent inhaler has been replaced by the combivent respimat inhaler spray. Please send over a new rx, thank you!

## 2012-04-29 ENCOUNTER — Ambulatory Visit (INDEPENDENT_AMBULATORY_CARE_PROVIDER_SITE_OTHER): Payer: Medicare Other | Admitting: Internal Medicine

## 2012-04-29 VITALS — BP 134/82 | HR 70 | Temp 98.0°F | Wt 167.0 lb

## 2012-04-29 DIAGNOSIS — I10 Essential (primary) hypertension: Secondary | ICD-10-CM | POA: Diagnosis not present

## 2012-04-29 MED ORDER — IPRATROPIUM-ALBUTEROL 20-100 MCG/ACT IN AERS: 1.0000 | INHALATION_SPRAY | Freq: Four times a day (QID) | RESPIRATORY_TRACT | Status: DC | PRN

## 2012-04-30 ENCOUNTER — Encounter: Payer: Self-pay | Admitting: *Deleted

## 2012-04-30 ENCOUNTER — Telehealth: Payer: Self-pay | Admitting: *Deleted

## 2012-04-30 NOTE — Telephone Encounter (Signed)
Error

## 2012-05-01 NOTE — Progress Notes (Signed)
  Subjective:    Patient ID: Jim [name], male    DOB: 1932-05-06, 78 y.o.   MRN: [id number]  HPI Here for BP check   Review of Systems     Objective:   Physical Exam        Assessment & Plan:  BP is satisfactory today, no change

## 2012-06-05 ENCOUNTER — Other Ambulatory Visit: Payer: Self-pay | Admitting: Internal Medicine

## 2012-06-07 ENCOUNTER — Ambulatory Visit: Payer: Medicare Other | Admitting: Internal Medicine

## 2012-06-19 ENCOUNTER — Other Ambulatory Visit: Payer: Self-pay | Admitting: Internal Medicine

## 2012-06-21 NOTE — Telephone Encounter (Signed)
Refill done.  

## 2012-06-30 ENCOUNTER — Encounter: Payer: Self-pay | Admitting: Pulmonary Disease

## 2012-06-30 ENCOUNTER — Ambulatory Visit (INDEPENDENT_AMBULATORY_CARE_PROVIDER_SITE_OTHER): Payer: Medicare Other | Admitting: Pulmonary Disease

## 2012-06-30 VITALS — BP 142/80 | HR 73 | Temp 97.9°F | Ht 70.0 in | Wt 166.0 lb

## 2012-06-30 DIAGNOSIS — J438 Other emphysema: Secondary | ICD-10-CM

## 2012-06-30 NOTE — Patient Instructions (Addendum)
No change in medications. Stay active, and call if having issues. followup with me in 6mos.

## 2012-06-30 NOTE — Progress Notes (Signed)
  Subjective:    Patient ID: Jim [name], male    DOB: Nov 12, 1932, 78 y.o.   MRN: [id number]  HPI The patient comes in today for followup of his known severe emphysema.  He has done very well on a LABA/ICS alone, and has not required escalation of treatment.  He has not had a recent acute exacerbation or pulmonary impaction.  He feels that his exertional tolerance is at baseline, and denies any significant cough, chest congestion, or mucus.  He has not required excessive use of his rescue inhaler.   Review of Systems  Constitutional: Negative for fever and unexpected weight change.  HENT: Negative for ear pain, nosebleeds, congestion, sore throat, rhinorrhea, sneezing, trouble swallowing, dental problem, postnasal drip and sinus pressure.   Eyes: Negative for redness and itching.  Respiratory: Negative for cough, chest tightness, shortness of breath and wheezing.   Cardiovascular: Negative for palpitations and leg swelling.  Gastrointestinal: Negative for nausea and vomiting.  Genitourinary: Negative for dysuria.  Musculoskeletal: Negative for joint swelling.  Skin: Negative for rash.  Neurological: Negative for headaches.  Hematological: Does not bruise/bleed easily.  Psychiatric/Behavioral: Negative for dysphoric mood. The patient is not nervous/anxious.   All other systems reviewed and are negative.       Objective:   Physical Exam Well-developed male in no acute distress Nose without purulence or discharge noted Chest with very diminished breath sounds throughout, no wheezing Cardiac exam with regular rate and rhythm Lower extremities without edema, no cyanosis Alert and oriented, moves all 4 extremities.       Assessment & Plan:

## 2012-06-30 NOTE — Assessment & Plan Note (Signed)
The patient appears to be stable from a COPD standpoint on his current bronchodilator regimen.  He is staying fairly active, and has not had a recent acute exacerbation.  I have asked him to continue on his current medication, and to work on some type of conditioning program.  If doing well, he is to followup with me in 6 months.

## 2012-07-01 ENCOUNTER — Other Ambulatory Visit: Payer: Self-pay | Admitting: Internal Medicine

## 2012-07-01 NOTE — Telephone Encounter (Signed)
Refill done.  

## 2012-07-23 ENCOUNTER — Ambulatory Visit (INDEPENDENT_AMBULATORY_CARE_PROVIDER_SITE_OTHER): Payer: Medicare Other | Admitting: Internal Medicine

## 2012-07-23 VITALS — BP 144/82 | HR 60 | Temp 97.8°F | Ht 67.75 in | Wt 165.0 lb

## 2012-07-23 DIAGNOSIS — E119 Type 2 diabetes mellitus without complications: Secondary | ICD-10-CM | POA: Diagnosis not present

## 2012-07-23 DIAGNOSIS — I10 Essential (primary) hypertension: Secondary | ICD-10-CM

## 2012-07-23 DIAGNOSIS — J438 Other emphysema: Secondary | ICD-10-CM

## 2012-07-23 DIAGNOSIS — E785 Hyperlipidemia, unspecified: Secondary | ICD-10-CM | POA: Diagnosis not present

## 2012-07-23 DIAGNOSIS — Z0000 Encounter for general adult medical examination without abnormal findings: Secondary | ICD-10-CM

## 2012-07-23 LAB — CBC WITH DIFFERENTIAL/PLATELET
Basophils Absolute: 0.1 10*3/uL (ref 0.0–0.1)
Eosinophils Absolute: 0.6 10*3/uL (ref 0.0–0.7)
HCT: 43.2 % (ref 39.0–52.0)
Lymphs Abs: 1.6 10*3/uL (ref 0.7–4.0)
MCV: 94.4 fl (ref 78.0–100.0)
Monocytes Absolute: 0.6 10*3/uL (ref 0.1–1.0)
Platelets: 207 10*3/uL (ref 150.0–400.0)
RDW: 13.6 % (ref 11.5–14.6)

## 2012-07-23 LAB — BASIC METABOLIC PANEL
BUN: 16 mg/dL (ref 6–23)
CO2: 27 mEq/L (ref 19–32)
Calcium: 9 mg/dL (ref 8.4–10.5)
Chloride: 104 mEq/L (ref 96–112)
Creatinine, Ser: 0.9 mg/dL (ref 0.4–1.5)

## 2012-07-23 LAB — ALT: ALT: 14 U/L (ref 0–53)

## 2012-07-23 LAB — LIPID PANEL: Cholesterol: 144 mg/dL (ref 0–200)

## 2012-07-23 NOTE — Progress Notes (Signed)
  Subjective:    Patient ID: Jim [name], male    DOB: 10-11-1932, 78 y.o.   MRN: [id number]  HPI Here for Medicare AWV: 1. Risk factors based on Past M, S, F history: reviewed 2. Physical Activities: active, no routine exercise , no yard work anymore  3. Depression/mood:  No problemss noted or reported  4. Hearing: No problems noted or reported  5. ADL's:  Independent 6. Fall Risk: prevention discussed  7. home Safety: does feel safe at home  8. Height, weight, &visual acuity: see VS, sees the eye doctor , s/p cataracts, great vision 9. Counseling: provided 10. Labs ordered based on risk factors: if needed  11. Referral Coordination: if needed 12.  Care Plan, see assessment and plan  13.   Cognitive Assessment: Cognition and motor skills seem appropriate for age 36 year old gentleman  In addition, today we discussed the following: Diabetes, good medication compliance without apparent side effects. No ambulatory blood sugars Hypertension, good medication compliance, BPs are rarely checked but usually "better than the number you have here today". COPD, good medication compliance, see assessment and plan. Needs a permanent disability parking  Past Medical History  Diagnosis Date  . DM (diabetes mellitus) 10/2009  . 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 Abnormal chest xray     last XR 11-2010, no further  f/u CXRs  . Syncope 2010    saw cards   . Hyperlipidemia   . Hypertension   . Prostate cancer 2008    s/p XRT  . ED (erectile dysfunction)    Past Surgical History  Procedure Date  . Cataract extraction     bilaterally   Family History: prostate  ca-- no colon ca-- no Brothers has a history of cirrhosis (liver) CAD-- brother  DM--no  Mother had dementia father had a brain hemorrhage.    Social History: Single, lives w/ son, two children Retired independent on ADL, still drives  Patient  states former smoker. quit 2007. ETOH-- rarely  Review of Systems No chest pain, shortness of breath at baseline. Cough mostly in the mornings, he bring up some sputum, no hemoptysis. Note nausea, vomiting, diarrhea or blood in the stools. No dysuria or gross hematuria.     Objective:   Physical Exam General -- alert, well-developed, and well-nourished.   Neck --no thyromegaly , normal carotid pulse Lungs --decreased breath sounds otherwise clear Heart-- normal rate, regular rhythm, no murmur, and no gallop.   Abdomen--soft, non-tender, no distention, no masses, no HSM, no guarding, and no rigidity.  No bruit DIABETIC FEET EXAM: No lower extremity edema Normal pedal pulses bilaterally Skin and nails are normal without calluses Pinprick examination of the feet normal. Neurologic-- alert & oriented X3 and strength normal in all extremities. Psych-- Cognition and judgment appear intact. Alert and cooperative with normal attention span and concentration.  not anxious appearing and not depressed appearing.      Assessment & Plan:

## 2012-07-23 NOTE — Assessment & Plan Note (Signed)
BP today in the 140/80 area, reports that at home is usually less than that. Check a BMP, no change.

## 2012-07-23 NOTE — Assessment & Plan Note (Addendum)
Good compliance with metformin, check the A1c and LFTs Feet exam today normal. Eyecare: Sees eye Dr. routinely

## 2012-07-23 NOTE — Assessment & Plan Note (Signed)
Td 09 pneumonia shot 2005 and 11--2010 Got a zostavax at the pharmacy last year Plans to get a flu shot last colonoscopy 06/2009, next in 5 years PSA per urology Has a healthy lifestyle, I encouraged him to exercise more if possible  Encourage to continue his healthy lifestyle

## 2012-07-23 NOTE — Assessment & Plan Note (Addendum)
On oxygen when necessary. Takes his medications appropriately: Symbicort twice a day "most days" Combivent around once a day Nebs usually 3 times a month. Plan: Continue with present care, permanent disability parking provided

## 2012-07-23 NOTE — Assessment & Plan Note (Addendum)
Good compliance with Lipitor, check a FLP

## 2012-07-27 ENCOUNTER — Encounter: Payer: Self-pay | Admitting: *Deleted

## 2012-07-28 ENCOUNTER — Other Ambulatory Visit: Payer: Self-pay | Admitting: Pulmonary Disease

## 2012-08-14 ENCOUNTER — Other Ambulatory Visit: Payer: Self-pay | Admitting: Internal Medicine

## 2012-08-26 DIAGNOSIS — Z23 Encounter for immunization: Secondary | ICD-10-CM | POA: Diagnosis not present

## 2012-08-31 ENCOUNTER — Other Ambulatory Visit: Payer: Self-pay | Admitting: Internal Medicine

## 2012-08-31 NOTE — Telephone Encounter (Signed)
Refill done.  

## 2012-11-03 DIAGNOSIS — Z8546 Personal history of malignant neoplasm of prostate: Secondary | ICD-10-CM | POA: Diagnosis not present

## 2012-11-10 DIAGNOSIS — Z8546 Personal history of malignant neoplasm of prostate: Secondary | ICD-10-CM | POA: Diagnosis not present

## 2012-11-19 ENCOUNTER — Other Ambulatory Visit: Payer: Self-pay | Admitting: Internal Medicine

## 2012-11-19 NOTE — Telephone Encounter (Signed)
Refill done.  

## 2012-11-24 DIAGNOSIS — K7589 Other specified inflammatory liver diseases: Secondary | ICD-10-CM

## 2012-11-24 HISTORY — DX: Other specified inflammatory liver diseases: K75.89

## 2012-12-23 ENCOUNTER — Other Ambulatory Visit: Payer: Self-pay | Admitting: Internal Medicine

## 2012-12-23 NOTE — Telephone Encounter (Signed)
Refill done.  

## 2012-12-29 ENCOUNTER — Encounter: Payer: Self-pay | Admitting: Pulmonary Disease

## 2012-12-29 ENCOUNTER — Ambulatory Visit (INDEPENDENT_AMBULATORY_CARE_PROVIDER_SITE_OTHER): Payer: Medicare Other | Admitting: Pulmonary Disease

## 2012-12-29 VITALS — BP 154/82 | HR 82 | Temp 99.0°F | Ht 70.0 in | Wt 170.4 lb

## 2012-12-29 DIAGNOSIS — J449 Chronic obstructive pulmonary disease, unspecified: Secondary | ICD-10-CM

## 2012-12-29 NOTE — Patient Instructions (Addendum)
Stay on your current medications. Stay as active as possible.  Will check oxygen level overnight to see if you need oxygen or not.  Will call you with results. If doing well, followup with me again in 6mos.

## 2012-12-29 NOTE — Progress Notes (Signed)
  Subjective:    Patient ID: Jim [name], male    DOB: 1932/04/17, 79 y.o.   MRN: [id number]  HPI Patient comes in today for followup of his known COPD.  He is staying on his maintenance bronchodilator regimen, and has not had an acute exacerbation or pulmonary infection since last visit.  He feels that his exertional tolerance is at baseline, and does not have any significant cough or mucus during the day.  He has had oxygen prescribed for him, but rarely uses it currently.   Review of Systems  Constitutional: Negative for fever and unexpected weight change.  HENT: Negative for ear pain, nosebleeds, congestion, sore throat, rhinorrhea, sneezing, trouble swallowing, dental problem, postnasal drip and sinus pressure.   Eyes: Negative for redness and itching.  Respiratory: Negative for cough, chest tightness, shortness of breath and wheezing.   Cardiovascular: Negative for palpitations and leg swelling.  Gastrointestinal: Negative for nausea and vomiting.  Genitourinary: Negative for dysuria.  Musculoskeletal: Negative for joint swelling.  Skin: Negative for rash.  Neurological: Negative for headaches.  Hematological: Bruises/bleeds easily ( d/t blood thinners).  Psychiatric/Behavioral: Negative for dysphoric mood. The patient is not nervous/anxious.        Objective:   Physical Exam Well-developed male in no acute distress Nose without purulence or discharge noted Neck without lymphadenopathy or thyromegaly Chest with decreased breath sounds, no wheezes or rhonchi Cardiac exam with regular rate and rhythm Lower extremities without edema, no cyanosis Alert and oriented, moves all 4 extremities.       Assessment & Plan:

## 2012-12-29 NOTE — Assessment & Plan Note (Signed)
The patient has been doing well from a COPD standpoint, with no recent acute exacerbation pulmonary infection.  He is staying on his maintenance bronchodilators, and I've encouraged him to work on some type of conditioning program.  He has oxygen at home but is not currently wearing it, and therefore recheck an overnight oximetry to see if he still needs it.

## 2012-12-31 ENCOUNTER — Other Ambulatory Visit: Payer: Self-pay | Admitting: Internal Medicine

## 2012-12-31 NOTE — Telephone Encounter (Signed)
Reill done

## 2013-01-03 ENCOUNTER — Encounter: Payer: Self-pay | Admitting: Pulmonary Disease

## 2013-01-03 ENCOUNTER — Telehealth: Payer: Self-pay | Admitting: *Deleted

## 2013-01-03 NOTE — Telephone Encounter (Signed)
ONO results have been received and are in your [facility]avia folder for review.

## 2013-01-03 NOTE — Telephone Encounter (Signed)
Let pt know that he did drop his oxygen level overnight, but not for a long time.  I think he should consider oxygen while sleeping.  See if he is ok with this, and will send order to [facility].

## 2013-01-04 NOTE — Telephone Encounter (Signed)
Tell him to start on the oxygen at 2 liters each night while sleeping.

## 2013-01-04 NOTE — Telephone Encounter (Signed)
Patient states that he already has O2 that he is supposed to be using at night but has not used it in a long time. Pt states that he used to sleep with it on at 2.5 L/min. Pt is interested in restarting.   Dr Shelle Iron please advise. Thanks.

## 2013-01-05 NOTE — Telephone Encounter (Signed)
Patient aware to start O2 2 L/min qhs. Call if any issues. Nothing further needed.

## 2013-01-12 ENCOUNTER — Other Ambulatory Visit: Payer: Self-pay | Admitting: Pulmonary Disease

## 2013-01-17 ENCOUNTER — Encounter: Payer: Self-pay | Admitting: Pulmonary Disease

## 2013-01-21 ENCOUNTER — Encounter: Payer: Self-pay | Admitting: Internal Medicine

## 2013-01-21 ENCOUNTER — Ambulatory Visit (INDEPENDENT_AMBULATORY_CARE_PROVIDER_SITE_OTHER): Payer: Medicare Other | Admitting: Internal Medicine

## 2013-01-21 VITALS — BP 128/82 | HR 58 | Wt 169.0 lb

## 2013-01-21 DIAGNOSIS — I10 Essential (primary) hypertension: Secondary | ICD-10-CM | POA: Diagnosis not present

## 2013-01-21 DIAGNOSIS — E119 Type 2 diabetes mellitus without complications: Secondary | ICD-10-CM | POA: Diagnosis not present

## 2013-01-21 DIAGNOSIS — J449 Chronic obstructive pulmonary disease, unspecified: Secondary | ICD-10-CM | POA: Diagnosis not present

## 2013-01-21 LAB — HEMOGLOBIN A1C: Hgb A1c MFr Bld: 5.9 % (ref 4.6–6.5)

## 2013-01-21 LAB — BASIC METABOLIC PANEL
BUN: 13 mg/dL (ref 6–23)
CO2: 28 mEq/L (ref 19–32)
Chloride: 103 mEq/L (ref 96–112)
Glucose, Bld: 106 mg/dL — ABNORMAL HIGH (ref 70–99)
Potassium: 4 mEq/L (ref 3.5–5.1)

## 2013-01-21 NOTE — Assessment & Plan Note (Signed)
Good compliance with medication, check a hemoglobin A1c

## 2013-01-21 NOTE — Assessment & Plan Note (Signed)
Good compliance with medicines, satisfactory ambulatory BPs. Check a BMP, continue with same medications.

## 2013-01-21 NOTE — Assessment & Plan Note (Signed)
Seems to be under excellent control, on oxygen at night. Had a flu shot.

## 2013-01-21 NOTE — Progress Notes (Signed)
  Subjective:    Patient ID: Jim [name], male    DOB: 13-Sep-1932, 79 y.o.   MRN: [id number]  HPI  Routine office visit Diabetes, good medication compliance, no ambulatory blood sugars. High cholesterol, good medication compliance. Hypertension, taking his medication appropriately, ambulatory BPs range from 115/61 to 164/84. Most readings are within normal. COPD, taking his medications correctly, hardly ever  uses albuterol, less than once a week. Good compliance with oxygen at night.  Past Medical History  Diagnosis Date  . DM (diabetes mellitus) 10/2009  . 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 Abnormal chest xray     last XR 11-2010, no further  f/u CXRs  . Syncope 2010    saw cards   . Hyperlipidemia   . Hypertension   . Prostate cancer 2008    s/p XRT  . ED (erectile dysfunction)    Past Surgical History  Procedure Laterality Date  . Cataract extraction      bilaterally   History   Social History  . Marital Status: Divorced    Spouse Name: N/A    Number of Children: 2  . Years of Education: N/A   Occupational History  . Not on file.   Social History Main Topics  . Smoking status: Former Smoker -- 2.00 packs/day for 40 years    Types: Cigarettes    Quit date: 11/24/2005  . Smokeless tobacco: Never Used  . Alcohol Use: 0.0 oz/week     Comment: rarely  . Drug Use: Not on file  . Sexually Active: Not on file   Other Topics Concern  . Not on file   Social History Narrative   Independent on ADL, does yard work.   Lives with son.   Review of Systems Denies chest pain or lower extremity edema. Occasional cough, very small amount of clear sputum production. Shortness or breath is at baseline, very mild.     Objective:   Physical Exam General -- alert, well-developed  Lungs -- normal respiratory effort, no intercostal retractions, no accessory muscle use, and decreased breath sounds.    Heart-- slightly distant heart sounds but otherwise normal Extremities-- no pretibial edema bilaterally  Psych-- Cognition and judgment appear intact. Alert and cooperative with normal attention span and concentration.  not anxious appearing and not depressed appearing.      Assessment & Plan:

## 2013-01-24 ENCOUNTER — Encounter: Payer: Self-pay | Admitting: *Deleted

## 2013-02-26 ENCOUNTER — Other Ambulatory Visit: Payer: Self-pay | Admitting: Internal Medicine

## 2013-02-28 NOTE — Telephone Encounter (Signed)
Refill done.  

## 2013-03-25 DIAGNOSIS — J449 Chronic obstructive pulmonary disease, unspecified: Secondary | ICD-10-CM | POA: Diagnosis not present

## 2013-05-04 DIAGNOSIS — Z961 Presence of intraocular lens: Secondary | ICD-10-CM | POA: Diagnosis not present

## 2013-05-04 DIAGNOSIS — H04129 Dry eye syndrome of unspecified lacrimal gland: Secondary | ICD-10-CM | POA: Diagnosis not present

## 2013-05-04 DIAGNOSIS — E119 Type 2 diabetes mellitus without complications: Secondary | ICD-10-CM | POA: Diagnosis not present

## 2013-05-04 DIAGNOSIS — H3530 Unspecified macular degeneration: Secondary | ICD-10-CM | POA: Diagnosis not present

## 2013-05-10 ENCOUNTER — Encounter: Payer: Self-pay | Admitting: *Deleted

## 2013-05-14 ENCOUNTER — Other Ambulatory Visit: Payer: Self-pay | Admitting: Internal Medicine

## 2013-05-19 ENCOUNTER — Encounter: Payer: Self-pay | Admitting: Internal Medicine

## 2013-06-03 ENCOUNTER — Other Ambulatory Visit: Payer: Self-pay | Admitting: Internal Medicine

## 2013-06-03 NOTE — Telephone Encounter (Signed)
Refill done per protocol.  

## 2013-06-11 ENCOUNTER — Other Ambulatory Visit: Payer: Self-pay | Admitting: Internal Medicine

## 2013-06-29 ENCOUNTER — Ambulatory Visit: Payer: Medicare Other | Admitting: Pulmonary Disease

## 2013-07-02 ENCOUNTER — Other Ambulatory Visit: Payer: Self-pay | Admitting: Pulmonary Disease

## 2013-07-05 ENCOUNTER — Ambulatory Visit: Payer: Medicare Other | Admitting: Pulmonary Disease

## 2013-07-21 ENCOUNTER — Encounter: Payer: Medicare Other | Admitting: Internal Medicine

## 2013-07-21 ENCOUNTER — Other Ambulatory Visit: Payer: Self-pay | Admitting: Internal Medicine

## 2013-07-29 ENCOUNTER — Ambulatory Visit (INDEPENDENT_AMBULATORY_CARE_PROVIDER_SITE_OTHER): Payer: Medicare Other | Admitting: Pulmonary Disease

## 2013-07-29 ENCOUNTER — Encounter: Payer: Self-pay | Admitting: Pulmonary Disease

## 2013-07-29 VITALS — BP 192/100 | HR 74 | Temp 98.8°F | Ht 70.5 in | Wt 170.2 lb

## 2013-07-29 DIAGNOSIS — J449 Chronic obstructive pulmonary disease, unspecified: Secondary | ICD-10-CM | POA: Diagnosis not present

## 2013-07-29 NOTE — Patient Instructions (Addendum)
No change in your medications Try and stay on the oxygen at night as much as possible.  If you think it may be drying you out, let us know and we can arrange for a "bubble bottle" to help with humidity. Stay as active as possible. followup with me in 6mos .

## 2013-07-29 NOTE — Progress Notes (Signed)
  Subjective:    Patient ID: Jim [name], male    DOB: Sep 07, 1932, 79 y.o.   MRN: [id number]  HPI Patient comes in today for followup of his known COPD.  He is staying on his medications compliantly, and has not had an acute exacerbation pulmonary infection since last visit.  He feels that his exertional tolerance is at baseline, and denies any significant cough.  He was started on oxygen at night for nocturnal hypoxemia, but he is only wearing it intermittently.  He thinks it may actually be making his breathing worse first thing in the morning, but I'm wondering if this is because of dryness.  His blood pressure is elevated today, and I have asked him to take this at home for the next few days and call his primary physician if it stays elevated.   Review of Systems  Constitutional: Negative for fever and unexpected weight change.  HENT: Negative for ear pain, nosebleeds, congestion, sore throat, rhinorrhea, sneezing, trouble swallowing, dental problem, postnasal drip and sinus pressure.   Eyes: Negative for redness and itching.  Respiratory: Negative for cough, chest tightness, shortness of breath and wheezing.   Cardiovascular: Negative for palpitations and leg swelling.  Gastrointestinal: Negative for nausea and vomiting.  Genitourinary: Negative for dysuria.  Musculoskeletal: Negative for joint swelling.  Skin: Negative for rash.  Neurological: Negative for headaches.  Hematological: Does not bruise/bleed easily.  Psychiatric/Behavioral: Negative for dysphoric mood. The patient is not nervous/anxious.        Objective:   Physical Exam Well-developed male in no acute distress Nose without purulence or discharge noted Neck without lymphadenopathy or thyromegaly Chest with decreased breath sounds throughout, no wheezing Cardiac exam regular rate and rhythm Lower extremities without edema, no cyanosis Alert and oriented, moves all 4 extremities.       Assessment & Plan:

## 2013-07-29 NOTE — Assessment & Plan Note (Signed)
The patient is at his usual baseline on his current maintenance bronchodilators.  He has not seen any change in his exertional tolerance, nor has he had an acute exacerbation.  I have asked him to continue with his current medications, and try and stay as active as possible.  If he thinks dryness is an issue with his oxygen, we can arrange for a bubble bottle.

## 2013-08-04 ENCOUNTER — Ambulatory Visit (INDEPENDENT_AMBULATORY_CARE_PROVIDER_SITE_OTHER): Payer: Medicare Other | Admitting: Internal Medicine

## 2013-08-04 ENCOUNTER — Encounter: Payer: Self-pay | Admitting: Internal Medicine

## 2013-08-04 VITALS — BP 173/82 | HR 72 | Temp 98.6°F | Ht 68.1 in | Wt 168.2 lb

## 2013-08-04 DIAGNOSIS — I10 Essential (primary) hypertension: Secondary | ICD-10-CM

## 2013-08-04 DIAGNOSIS — E119 Type 2 diabetes mellitus without complications: Secondary | ICD-10-CM

## 2013-08-04 DIAGNOSIS — E785 Hyperlipidemia, unspecified: Secondary | ICD-10-CM

## 2013-08-04 DIAGNOSIS — Z0000 Encounter for general adult medical examination without abnormal findings: Secondary | ICD-10-CM | POA: Diagnosis not present

## 2013-08-04 DIAGNOSIS — J449 Chronic obstructive pulmonary disease, unspecified: Secondary | ICD-10-CM

## 2013-08-04 LAB — CBC WITH DIFFERENTIAL/PLATELET
Basophils Absolute: 0.1 10*3/uL (ref 0.0–0.1)
HCT: 42.6 % (ref 39.0–52.0)
Lymphs Abs: 1.6 10*3/uL (ref 0.7–4.0)
Monocytes Absolute: 0.7 10*3/uL (ref 0.1–1.0)
Monocytes Relative: 6.6 % (ref 3.0–12.0)
Neutrophils Relative %: 70.1 % (ref 43.0–77.0)
Platelets: 223 10*3/uL (ref 150.0–400.0)
RDW: 13.2 % (ref 11.5–14.6)

## 2013-08-04 LAB — COMPREHENSIVE METABOLIC PANEL
ALT: 23 U/L (ref 0–53)
AST: 21 U/L (ref 0–37)
Alkaline Phosphatase: 109 U/L (ref 39–117)
BUN: 14 mg/dL (ref 6–23)
Calcium: 9 mg/dL (ref 8.4–10.5)
Creatinine, Ser: 0.9 mg/dL (ref 0.4–1.5)
Total Bilirubin: 1.1 mg/dL (ref 0.3–1.2)

## 2013-08-04 LAB — LIPID PANEL
HDL: 45.9 mg/dL (ref >39.00)
Total CHOL/HDL Ratio: 3
Triglycerides: 118 mg/dL (ref 0.0–149.0)
VLDL: 23.6 mg/dL (ref 0.0–40.0)

## 2013-08-04 LAB — HEMOGLOBIN A1C: Hgb A1c MFr Bld: 6.6 % — ABNORMAL HIGH (ref 4.6–6.5)

## 2013-08-04 NOTE — Patient Instructions (Signed)
Get your blood work before you leave  Next visit in  2 months  for a routine office visit Please make an appointment before you leave the office today (or call few weeks in advance) --- Check the  blood pressure 2 or 3 times a week, be sure it is between 110/60 and 140/85. If it is consistently higher or lower, let me know. Bring a log of your blood pressures in 2 months  --- symbicort : take twice a day every day Combivent and nebulizations only if needed ---- You need a high dose flu shot, we don't have it today, call in 2 weeks or call your pharmacy   Fall Prevention and Home Safety Falls cause injuries and can affect all age groups. It is possible to use preventive measures to significantly decrease the likelihood of falls. There are many simple measures which can make your home safer and prevent falls. OUTDOORS  Repair cracks and edges of walkways and driveways.  Remove high doorway thresholds.  Trim shrubbery on the main path into your home.  Have good outside lighting.  Clear walkways of tools, rocks, debris, and clutter.  Check that handrails are not broken and are securely fastened. Both sides of steps should have handrails.  Have leaves, snow, and ice cleared regularly.  Use sand or salt on walkways during winter months.  In the garage, clean up grease or oil spills. BATHROOM  Install night lights.  Install grab bars by the toilet and in the tub and shower.  Use non-skid mats or decals in the tub or shower.  Place a plastic non-slip stool in the shower to sit on, if needed.  Keep floors dry and clean up all water on the floor immediately.  Remove soap buildup in the tub or shower on a regular basis.  Secure bath mats with non-slip, double-sided rug tape.  Remove throw rugs and tripping hazards from the floors. BEDROOMS  Install night lights.  Make sure a bedside light is easy to reach.  Do not use oversized bedding.  Keep a telephone by your  bedside.  Have a firm chair with side arms to use for getting dressed.  Remove throw rugs and tripping hazards from the floor. KITCHEN  Keep handles on pots and pans turned toward the center of the stove. Use back burners when possible.  Clean up spills quickly and allow time for drying.  Avoid walking on wet floors.  Avoid hot utensils and knives.  Position shelves so they are not too high or low.  Place commonly used objects within easy reach.  If necessary, use a sturdy step stool with a grab bar when reaching.  Keep electrical cables out of the way.  Do not use floor polish or wax that makes floors slippery. If you must use wax, use non-skid floor wax.  Remove throw rugs and tripping hazards from the floor. STAIRWAYS  Never leave objects on stairs.  Place handrails on both sides of stairways and use them. Fix any loose handrails. Make sure handrails on both sides of the stairways are as long as the stairs.  Check carpeting to make sure it is firmly attached along stairs. Make repairs to worn or loose carpet promptly.  Avoid placing throw rugs at the top or bottom of stairways, or properly secure the rug with carpet tape to prevent slippage. Get rid of throw rugs, if possible.  Have an electrician put in a light switch at the top and bottom of the stairs.  OTHER FALL PREVENTION TIPS  Wear low-heel or rubber-soled shoes that are supportive and fit well. Wear closed toe shoes.  When using a stepladder, make sure it is fully opened and both spreaders are firmly locked. Do not climb a closed stepladder.  Add color or contrast paint or tape to grab bars and handrails in your home. Place contrasting color strips on first and last steps.  Learn and use mobility aids as needed. Install an electrical emergency response system.  Turn on lights to avoid dark areas. Replace light bulbs that burn out immediately. Get light switches that glow.  Arrange furniture to create clear  pathways. Keep furniture in the same place.  Firmly attach carpet with non-skid or double-sided tape.  Eliminate uneven floor surfaces.  Select a carpet pattern that does not visually hide the edge of steps.  Be aware of all pets. OTHER HOME SAFETY TIPS  Set the water temperature for 120 F (48.8 C).  Keep emergency numbers on or near the telephone.  Keep smoke detectors on every level of the home and near sleeping areas. Document Released: 10/31/2002 Document Revised: 05/11/2012 Document Reviewed: 01/30/2012 [facility] Patient Information 2014 [place], [place].

## 2013-08-04 NOTE — Assessment & Plan Note (Addendum)
Td 09 pneumonia shot 2005 and 11--2010 Got a zostavax at the pharmacy ~ 2012 Needs high potency flu shot, see instructions  last colonoscopy 06/2009, normal, was told to repeat in 5 years; Given age and comorbidities I don't think colonoscopy is mandatory infact I don't think he needs one. I gave him my opinion, he agrees with me, nevertheless I encouraged him to call   GI if he likes to discuss further with Dr Candelaria Stagers  PSA per urology  Diet-exercise discussed

## 2013-08-04 NOTE — Assessment & Plan Note (Signed)
Labs

## 2013-08-04 NOTE — Progress Notes (Signed)
Subjective:    Patient ID: Jim [name], male    DOB: June 19, 1932, 79 y.o.   MRN: [id number]  HPI  Here for Medicare AWV: 1. Risk factors based on Past M, S, F history: reviewed 2. Physical Activities: active at home, no routine exercise , no yard work anymore   3. Depression/mood:  Neg screening 4. Hearing: No problems noted or reported   5. ADL's:  Independent 6. Fall Risk: prevention discussed   7. home Safety: does feel safe at home   8. Height, weight, &visual acuity: see VS, sees the eye doctor , s/p cataracts, great vision 9. Counseling: provided 10. Labs ordered based on risk factors: if needed   11. Referral Coordination: if needed 12.  Care Plan, see assessment and plan   13.   Cognitive Assessment: Cognition and motor skills seem appropriate for age   In addition, today we discussed the following: COPD--states he takes "all his inhalers" reports he uses albuterol "sometimes" Diabetes, taking metformin, reports  normal ambulatory blood sugars High cholesterol, taking Lipitor. Hypertension, good medication compliance, ambulatory BP is checked, readings? "it varies"  Past Medical History  Diagnosis Date  . DM (diabetes mellitus) 10/2009  . 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 Abnormal chest xray     last XR 11-2010, no further  f/u CXRs  . Syncope 2010    saw cards   . Hyperlipidemia   . Hypertension   . Prostate cancer 2008    s/p XRT  . ED (erectile dysfunction)    Past Surgical History  Procedure Laterality Date  . Cataract extraction      bilaterally   History   Social History  . Marital Status: Divorced    Spouse Name: N/A    Number of Children: 2  . Years of Education: N/A   Occupational History  . retired     Social History Main Topics  . Smoking status: Former Smoker -- 2.00 packs/day for 40 years    Types: Cigarettes    Quit date: 11/24/2005  . Smokeless tobacco:  Never Used  . Alcohol Use: 0.0 oz/week     Comment: rarely  . Drug Use: No  . Sexual Activity: Not on file   Other Topics Concern  . Not on file   Social History Narrative   Independent on ADL    Lives with son.   Has Gk and GGk   Family History  Problem Relation Age of Onset  . Cirrhosis Brother     liver  . Dementia Mother   . Colon cancer Neg Hx   . Prostate cancer Neg Hx   . CAD Neg Hx      Review of Systems No chest pain, lower extremity edema or hemoptysis No  nausea, vomiting, blood in the stools. No dysuria, gross hematuria or difficulty urinating.     Objective:   Physical Exam BP 173/82  Pulse 72  Temp(Src) 98.6 F (37 C)  Ht 5' 8.1" (1.73 m)  Wt 168 lb 3.2 oz (76.295 kg)  BMI 25.49 kg/m2  SpO2 96% General -- alert, well-developed, NAD.  Neck --no thyromegaly , normal carotid pulse Lungs --  Decreased breath sounds otherwise clear Heart-- normal rate, regular rhythm, no murmur.  Abdomen-- Not distended, good bowel sounds,soft, non-tender. No rebound or rigidity. Extremities-- no pretibial edema bilaterally  Neurologic-- alert & oriented X3. Speech, gait normal. Psych-- Cognition  and judgment appear intact. Alert and cooperative with normal attention span and concentration. not anxious appearing and not depressed appearing.      Assessment & Plan:

## 2013-08-04 NOTE — Assessment & Plan Note (Signed)
Labs , good med compliance

## 2013-08-04 NOTE — Assessment & Plan Note (Signed)
Well-controlled? Ambulatory BP "varies", BP slightly elevated today. Plan: Return to the office in 2 months, encouraged to check his BPs and bring a log

## 2013-08-04 NOTE — Assessment & Plan Note (Addendum)
See history of present illness, hard to say what exactly how he is taking his meds. Plan:  Symbicort twice a day every day Albuterol or combivent when necessary. See instructions

## 2013-08-05 ENCOUNTER — Encounter: Payer: Self-pay | Admitting: Internal Medicine

## 2013-08-07 ENCOUNTER — Other Ambulatory Visit: Payer: Self-pay | Admitting: Internal Medicine

## 2013-08-08 NOTE — Telephone Encounter (Signed)
rx refilled per protocol. DJR  

## 2013-08-12 DIAGNOSIS — Z23 Encounter for immunization: Secondary | ICD-10-CM | POA: Diagnosis not present

## 2013-09-17 ENCOUNTER — Other Ambulatory Visit: Payer: Self-pay | Admitting: Internal Medicine

## 2013-09-19 ENCOUNTER — Other Ambulatory Visit: Payer: Self-pay | Admitting: *Deleted

## 2013-09-19 MED ORDER — ATORVASTATIN CALCIUM 20 MG PO TABS: ORAL_TABLET | ORAL | Status: DC

## 2013-09-19 NOTE — Telephone Encounter (Signed)
lipitor refill sent to pharmacy

## 2013-10-05 ENCOUNTER — Encounter: Payer: Self-pay | Admitting: Internal Medicine

## 2013-10-05 ENCOUNTER — Ambulatory Visit (INDEPENDENT_AMBULATORY_CARE_PROVIDER_SITE_OTHER): Payer: Medicare Other | Admitting: Internal Medicine

## 2013-10-05 VITALS — BP 169/77 | HR 66 | Temp 98.7°F | Wt 171.0 lb

## 2013-10-05 DIAGNOSIS — J449 Chronic obstructive pulmonary disease, unspecified: Secondary | ICD-10-CM | POA: Diagnosis not present

## 2013-10-05 DIAGNOSIS — I10 Essential (primary) hypertension: Secondary | ICD-10-CM | POA: Diagnosis not present

## 2013-10-05 MED ORDER — FUROSEMIDE 20 MG PO TABS: ORAL_TABLET | ORAL | Status: DC

## 2013-10-05 MED ORDER — CLONIDINE HCL 0.1 MG PO TABS: 0.1000 mg | ORAL_TABLET | Freq: Two times a day (BID) | ORAL | Status: DC

## 2013-10-05 MED ORDER — LOSARTAN POTASSIUM 100 MG PO TABS: ORAL_TABLET | ORAL | Status: DC

## 2013-10-05 MED ORDER — CARVEDILOL 25 MG PO TABS: ORAL_TABLET | ORAL | Status: DC

## 2013-10-05 NOTE — Progress Notes (Signed)
Pre visit review using our clinic review tool, if applicable. No additional management support is needed unless otherwise documented below in the visit note. 

## 2013-10-05 NOTE — Progress Notes (Signed)
  Subjective:    Patient ID: Jim [name], male    DOB: 28-May-1932, 79 y.o.   MRN: [id number]  HPI Routine office visit At the last visit, BP was noted to be elevated, good compliance with all medications including his hypertension medicines. Ambulatory BPs range from 140-163, diastolic BP usually 80 History of COPD, good compliance of medication, using his rescue inhalers less than daily   Past Medical History  Diagnosis Date  . DM (diabetes mellitus) 10/2009  . 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 Abnormal chest xray     last XR 11-2010, no further  f/u CXRs  . Syncope 2010    saw cards   . Hyperlipidemia   . Hypertension   . Prostate cancer 2008    s/p XRT  . ED (erectile dysfunction)    Past Surgical History  Procedure Laterality Date  . Cataract extraction      bilaterally     Review of Systems In general feeling well and has no specific concerns Denies any nausea, vomiting, diarrhea. No lower extremity edema    Objective:   Physical Exam BP 169/77  Pulse 66  Temp(Src) 98.7 F (37.1 C)  Wt 171 lb (77.565 kg)  SpO2 93% General -- alert, well-developed, NAD.   Lungs -- normal respiratory effort, no intercostal retractions, no accessory muscle use, and decreased breath sounds.  Heart-- normal rate, regular rhythm, no murmur.   Extremities-- no pretibial edema bilaterally  Neurologic--  alert & oriented X3.  Psych-- Cognition and judgment appear intact. Cooperative with normal attention span and concentration. No anxious appearing , no depressed appearing.      Assessment & Plan:

## 2013-10-05 NOTE — Patient Instructions (Signed)
In addition to all your meds, take clonidine twice a day Check the  blood pressure 3-4  times a  week be sure it is between 110/60 and 140/85. Ideal blood pressure is 120/80. If it is consistently higher or lower, let me know If you have side effects let me know    Next visit in 3 months  for a  diabetes hypertension   follow up (30 minutes) No Fasting Please make an appointment

## 2013-10-05 NOTE — Assessment & Plan Note (Signed)
Sx well-controlled, had a flu shot

## 2013-10-05 NOTE — Assessment & Plan Note (Addendum)
Ambulatory BP is between 140 and 163  , goal 124-140 Plan: Continue with losartan, Lasix, Coreg. H/o swelling w/ amlodipine Add clonidine, low dose  See instructions

## 2013-10-17 ENCOUNTER — Telehealth: Payer: Self-pay | Admitting: *Deleted

## 2013-10-17 MED ORDER — DOXAZOSIN MESYLATE 1 MG PO TABS: 1.0000 mg | ORAL_TABLET | Freq: Every day | ORAL | Status: DC

## 2013-10-17 NOTE — Telephone Encounter (Signed)
Patient made aware to stop clonidine and begin cardura, 1 tab at hs. Advised to watch for s/e such as dizziness.

## 2013-10-17 NOTE — Telephone Encounter (Signed)
Let pt know: D/c clonidine Start cardura 1mg  qhs, (rx sent)watch for BPs lower than 110/60, watch for diziness or s/e

## 2013-10-17 NOTE — Telephone Encounter (Signed)
Patient presented to the office stating that the clonidine is making him itch to the point he is unable to sleep. No rash or reddened areas on the patients skin. Can this be changed to something else?

## 2013-10-26 ENCOUNTER — Other Ambulatory Visit: Payer: Self-pay | Admitting: *Deleted

## 2013-10-26 MED ORDER — ALBUTEROL SULFATE (2.5 MG/3ML) 0.083% IN NEBU: 2.5000 mg | INHALATION_SOLUTION | Freq: Four times a day (QID) | RESPIRATORY_TRACT | Status: DC | PRN

## 2013-10-26 NOTE — Telephone Encounter (Signed)
rx refilled per protocol. DJR  

## 2013-10-31 ENCOUNTER — Telehealth: Payer: Self-pay | Admitting: Internal Medicine

## 2013-10-31 NOTE — Telephone Encounter (Signed)
RN was speaking to pt regarding all over body itching x 1 week when his phone died. Msg left to call back.

## 2013-10-31 NOTE — Telephone Encounter (Signed)
Patient called and stated that he was having side affects from a medication he was on but didn't know which one. So I  transferred him to call a nurse.

## 2013-10-31 NOTE — Telephone Encounter (Signed)
Please call pt and find out more about his sx

## 2013-11-01 NOTE — Telephone Encounter (Signed)
Itching all over Hives mostly on arms Started doxazosine a week ago. I advised patient to stop this medication.  Should an appt be made for today?

## 2013-11-01 NOTE — Telephone Encounter (Signed)
Discontinue doxazosin Take OTC Benadryl 25 mg twice a day If he has difficulty breathing, lip, tonge swelling: needs to go to the ER ASAP. Otherwise I like to see him tomorrow, I could see him at 11:30.

## 2013-11-01 NOTE — Telephone Encounter (Signed)
Called and spoke with patient. Appt was made for 11 tomorrow. Pt will not be here until closer to 11:30.  He stated that his urine is darker in color as well.

## 2013-11-02 ENCOUNTER — Ambulatory Visit (INDEPENDENT_AMBULATORY_CARE_PROVIDER_SITE_OTHER): Payer: Medicare Other | Admitting: Internal Medicine

## 2013-11-02 ENCOUNTER — Encounter: Payer: Self-pay | Admitting: Internal Medicine

## 2013-11-02 VITALS — BP 105/61 | HR 71 | Temp 97.8°F | Wt 160.0 lb

## 2013-11-02 DIAGNOSIS — T7840XA Allergy, unspecified, initial encounter: Secondary | ICD-10-CM

## 2013-11-02 DIAGNOSIS — I10 Essential (primary) hypertension: Secondary | ICD-10-CM

## 2013-11-02 DIAGNOSIS — K7589 Other specified inflammatory liver diseases: Secondary | ICD-10-CM | POA: Insufficient documentation

## 2013-11-02 DIAGNOSIS — Z8546 Personal history of malignant neoplasm of prostate: Secondary | ICD-10-CM | POA: Diagnosis not present

## 2013-11-02 LAB — URINALYSIS, ROUTINE W REFLEX MICROSCOPIC
Hgb urine dipstick: NEGATIVE
Ketones, ur: NEGATIVE
Nitrite: NEGATIVE
Total Protein, Urine: 30
Urine Glucose: NEGATIVE
Urobilinogen, UA: >=8 (ref 0.0–1.0)

## 2013-11-02 LAB — CBC WITH DIFFERENTIAL/PLATELET
Basophils Absolute: 0 10*3/uL (ref 0.0–0.1)
Basophils Relative: 0.5 % (ref 0.0–3.0)
Eosinophils Absolute: 0.2 10*3/uL (ref 0.0–0.7)
HCT: 36.5 % — ABNORMAL LOW (ref 39.0–52.0)
Hemoglobin: 12.4 g/dL — ABNORMAL LOW (ref 13.0–17.0)
Lymphocytes Relative: 20.8 % (ref 12.0–46.0)
Lymphs Abs: 1.5 10*3/uL (ref 0.7–4.0)
MCHC: 34.1 g/dL (ref 30.0–36.0)
MCV: 92.3 fl (ref 78.0–100.0)
Monocytes Absolute: 0.7 10*3/uL (ref 0.1–1.0)
Neutro Abs: 4.6 10*3/uL (ref 1.4–7.7)
Neutrophils Relative %: 65.6 % (ref 43.0–77.0)
RBC: 3.95 Mil/uL — ABNORMAL LOW (ref 4.22–5.81)
RDW: 14.8 % — ABNORMAL HIGH (ref 11.5–14.6)

## 2013-11-02 LAB — COMPREHENSIVE METABOLIC PANEL
Albumin: 3.3 g/dL — ABNORMAL LOW (ref 3.5–5.2)
Alkaline Phosphatase: 398 U/L — ABNORMAL HIGH (ref 39–117)
BUN: 24 mg/dL — ABNORMAL HIGH (ref 6–23)
CO2: 28 mEq/L (ref 19–32)
Calcium: 8.7 mg/dL (ref 8.4–10.5)
GFR: 62.63 mL/min (ref >60.00)
Glucose, Bld: 119 mg/dL — ABNORMAL HIGH (ref 70–99)
Potassium: 3.7 mEq/L (ref 3.5–5.1)

## 2013-11-02 NOTE — Patient Instructions (Signed)
Get your blood work before you leave  No more cardura or clonidine Other medications remain the same  Continue taking Benadryl once or twice a day Also take Zantac 75 mg one tablet twice a day for 10 days If they itching or rash gets worse or not getting better  let me know,  Next visit  regards diabetes hypertension , no fasting, in 4 weeks  Please make an appointment    Check the  blood pressure 2 or 3 times a week be sure it is between 110/60 and 140/85. Ideal blood pressure is 120/80. If it is consistently higher or lower, let me know

## 2013-11-02 NOTE — Progress Notes (Signed)
   Subjective:    Patient ID: Jim [name], male    DOB: July 03, 1932, 79 y.o.   MRN: [id number]  HPI Acute visit Last month he started clonidine and developed itching subsequently he switch to Cardura and developed itching again. He also has a rash described as flat patches of red skin mostly at the head, back and arms. Yesterday he discontinue Cardura and started to take Benadryl. Has noted his urine to be slightly dark in the last 24 hours.  Past Medical History  Diagnosis Date  . DM (diabetes mellitus) 10/2009  . 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 Abnormal chest xray     last XR 11-2010, no further  f/u CXRs  . Syncope 2010    saw cards   . Hyperlipidemia   . Hypertension   . Prostate cancer 2008    s/p XRT  . ED (erectile dysfunction)    Past Surgical History  Procedure Laterality Date  . Cataract extraction      bilaterally    Review of Systems Denies any fever or chills. No tonge or  lip swelling, breathing is at baseline. Denies any joint aches. No  nausea, vomiting, abdominal pain.     Objective:   Physical Exam BP 105/61  Pulse 71  Temp(Src) 97.8 F (36.6 C)  Wt 160 lb (72.576 kg)  SpO2 93% General -- alert, well-developed, NAD.   HEENT-- Not pale. ?jaundice Lungs -- normal respiratory effort, no intercostal retractions, no accessory muscle use, and normal breath sounds.  Heart-- normal rate, regular rhythm, no murmur.  Abdomen-- Not distended, good bowel sounds,soft, non-tender.  Extremities-- no pretibial edema bilaterally  Neurologic--  alert & oriented X3.   Psych-- Cognition and judgment appear intact. Cooperative with normal attention span and concentration. No anxious appearing , no depressed appearing.     Assessment & Plan:

## 2013-11-02 NOTE — Progress Notes (Signed)
Pre visit review using our clinic review tool, if applicable. No additional management support is needed unless otherwise documented below in the visit note. 

## 2013-11-02 NOTE — Assessment & Plan Note (Signed)
Allergic reaction to BP meds, see "hypertension". Will avoid  Steroids for now , he has diabetes, does not have a glucometer.

## 2013-11-02 NOTE — Assessment & Plan Note (Addendum)
Since the last time he was here, he try clonidin and Cardura, they did bring down his blood pressure but he developed itching. On today's exam, he seems to be jaundice (hemolysis,  drug-induced hepatitis?). BP today is low, he took the last Cardura last night. Plan:  CMP, CBC, UA  Cont benadryl, add zantac For now will avoid rx new meds as long as BP ~140, 160, see instructions  F/u 1 month

## 2013-11-03 ENCOUNTER — Telehealth: Payer: Self-pay | Admitting: Internal Medicine

## 2013-11-03 DIAGNOSIS — B179 Acute viral hepatitis, unspecified: Secondary | ICD-10-CM

## 2013-11-03 NOTE — Telephone Encounter (Signed)
Labs are showing increased LFTs, bilirubin and alkaline phosphate. Minimal anemia. I discussed informally results with GI, although this could well be a drug reaction they are also recommending a ultrasound plus  acute hepatitis serology.  Call the patient: Blood test showed his liver is affected. Needs ultrasound and further labs  come back today or tomorrow for further blood testing  (orders already in)  Followup with me next week.  ER if fever, chills, nausea, abdominal pain.

## 2013-11-03 NOTE — Telephone Encounter (Signed)
Pt notified of U/S and lab work . will come tomorrow for blood work. DJR

## 2013-11-04 ENCOUNTER — Other Ambulatory Visit (INDEPENDENT_AMBULATORY_CARE_PROVIDER_SITE_OTHER): Payer: Medicare Other

## 2013-11-04 DIAGNOSIS — K7200 Acute and subacute hepatic failure without coma: Secondary | ICD-10-CM

## 2013-11-05 LAB — HEPATITIS PANEL, ACUTE: HCV Ab: NEGATIVE

## 2013-11-07 ENCOUNTER — Telehealth: Payer: Self-pay | Admitting: Internal Medicine

## 2013-11-07 NOTE — Telephone Encounter (Signed)
Spoke with the patient, hepatitis urology was negative, patient aware; he is still itching, urine is still dark but not as much as before otherwise he feels well: no nausea, vomiting, fever. Appetite normal. Onalee Hua , call the patient, needs an appointment to see me  Friday, overbook okay

## 2013-11-08 ENCOUNTER — Other Ambulatory Visit: Payer: Self-pay | Admitting: Internal Medicine

## 2013-11-08 NOTE — Telephone Encounter (Signed)
rx refilled per protocol. DJR  

## 2013-11-08 NOTE — Telephone Encounter (Signed)
Pt scheduled. DJR

## 2013-11-09 ENCOUNTER — Other Ambulatory Visit: Payer: Medicare Other

## 2013-11-09 DIAGNOSIS — Z8546 Personal history of malignant neoplasm of prostate: Secondary | ICD-10-CM | POA: Diagnosis not present

## 2013-11-09 DIAGNOSIS — N400 Enlarged prostate without lower urinary tract symptoms: Secondary | ICD-10-CM | POA: Diagnosis not present

## 2013-11-10 ENCOUNTER — Ambulatory Visit
Admission: RE | Admit: 2013-11-10 | Discharge: 2013-11-10 | Disposition: A | Discharge status: Home / Self Care | Payer: Medicare Other | Source: Ambulatory Visit | Attending: Internal Medicine | Admitting: Internal Medicine

## 2013-11-10 DIAGNOSIS — K828 Other specified diseases of gallbladder: Secondary | ICD-10-CM | POA: Diagnosis not present

## 2013-11-10 DIAGNOSIS — B179 Acute viral hepatitis, unspecified: Secondary | ICD-10-CM

## 2013-11-11 ENCOUNTER — Encounter: Payer: Self-pay | Admitting: Internal Medicine

## 2013-11-11 ENCOUNTER — Ambulatory Visit (INDEPENDENT_AMBULATORY_CARE_PROVIDER_SITE_OTHER): Payer: Medicare Other | Admitting: Internal Medicine

## 2013-11-11 VITALS — BP 112/67 | HR 76 | Temp 98.2°F | Wt 156.0 lb

## 2013-11-11 DIAGNOSIS — I10 Essential (primary) hypertension: Secondary | ICD-10-CM

## 2013-11-11 DIAGNOSIS — T7589XS Other specified effects of external causes, sequela: Secondary | ICD-10-CM

## 2013-11-11 DIAGNOSIS — K759 Inflammatory liver disease, unspecified: Secondary | ICD-10-CM

## 2013-11-11 DIAGNOSIS — T788XXS Other adverse effects, not elsewhere classified, sequela: Secondary | ICD-10-CM | POA: Diagnosis not present

## 2013-11-11 DIAGNOSIS — T7840XS Allergy, unspecified, sequela: Secondary | ICD-10-CM

## 2013-11-11 LAB — COMPREHENSIVE METABOLIC PANEL
ALT: 56 U/L — ABNORMAL HIGH (ref 0–53)
AST: 41 U/L — ABNORMAL HIGH (ref 0–37)
Albumin: 3.4 g/dL — ABNORMAL LOW (ref 3.5–5.2)
BUN: 17 mg/dL (ref 6–23)
CO2: 25 mEq/L (ref 19–32)
Calcium: 8.9 mg/dL (ref 8.4–10.5)
Chloride: 104 mEq/L (ref 96–112)
Creatinine, Ser: 1 mg/dL (ref 0.4–1.5)
GFR: 79.29 mL/min (ref >60.00)
Glucose, Bld: 107 mg/dL — ABNORMAL HIGH (ref 70–99)
Potassium: 3.7 mEq/L (ref 3.5–5.1)
Sodium: 139 mEq/L (ref 135–145)
Total Protein: 7 g/dL (ref 6.0–8.3)

## 2013-11-11 LAB — CBC WITH DIFFERENTIAL/PLATELET
Basophils Absolute: 0.1 10*3/uL (ref 0.0–0.1)
Basophils Relative: 0.8 % (ref 0.0–3.0)
Eosinophils Absolute: 0.4 10*3/uL (ref 0.0–0.7)
Hemoglobin: 12.6 g/dL — ABNORMAL LOW (ref 13.0–17.0)
Lymphocytes Relative: 26.5 % (ref 12.0–46.0)
MCHC: 34 g/dL (ref 30.0–36.0)
Monocytes Relative: 9 % (ref 3.0–12.0)
Neutro Abs: 4.1 10*3/uL (ref 1.4–7.7)
Neutrophils Relative %: 57.5 % (ref 43.0–77.0)
Platelets: 243 10*3/uL (ref 150.0–400.0)
RBC: 4 Mil/uL — ABNORMAL LOW (ref 4.22–5.81)

## 2013-11-11 LAB — PROTIME-INR
INR: 1 ratio (ref 0.8–1.0)
Prothrombin Time: 10.9 s (ref 10.2–12.4)

## 2013-11-11 LAB — APTT: aPTT: 25.8 s (ref 21.7–28.8)

## 2013-11-11 MED ORDER — PREDNISONE 10 MG PO TABS: ORAL_TABLET | ORAL | Status: DC

## 2013-11-11 NOTE — Assessment & Plan Note (Signed)
Well-controlled, no change 

## 2013-11-11 NOTE — Progress Notes (Signed)
Pre visit review using our clinic review tool, if applicable. No additional management support is needed unless otherwise documented below in the visit note. 

## 2013-11-11 NOTE — Progress Notes (Signed)
   Subjective:    Patient ID: Jim [name], male    DOB: Apr 22, 1932, 79 y.o.   MRN: [id number]  HPI Followup from previous visit. The patient was recently diagnosed with an allergic reaction, LFTs were elevated. Results reviewed, see assessment and plan.  Past Medical History  Diagnosis Date  . DM (diabetes mellitus) 10/2009  . 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 Abnormal chest xray     last XR 11-2010, no further  f/u CXRs  . Syncope 2010    saw cards   . Hyperlipidemia   . Hypertension   . Prostate cancer 2008    s/p XRT  . ED (erectile dysfunction)    Past Surgical History  Procedure Laterality Date  . Cataract extraction      bilaterally    Review of Systems Denies nausea, vomiting, diarrhea. No fever chills, no joint aches. Ambulatory blood pressures  Reportedly normal. + Itching continue, he is scratching mostly at the abdomen and back, has developed a rash in that area, patient thinks related to scratching.     Objective:   Physical Exam BP 112/67  Pulse 76  Temp(Src) 98.2 F (36.8 C)  Wt 156 lb (70.761 kg)  SpO2 95% General -- alert, well-developed, NAD.  HEENT-- Not pale, still jaundice to inspection Lungs -- normal respiratory effort, no intercostal retractions, no accessory muscle use, and normal breath sounds.  Heart-- normal rate, regular rhythm, no murmur.  Abdomen-- Not distended, good bowel sounds,soft, non-tender. No  organomegaly. Skin-- Clear or evidence of scratching in the back, at the abdomen he has several excoriations that are round, 2 or 3 mm, may or may not be from scratching. No other rashes noted Extremities-- no pretibial edema bilaterally  Neurologic--  alert & oriented X3.   Psych-- Cognition and judgment appear intact. Cooperative with normal attention span and concentration. No anxious appearing , no depressed appearing.     Assessment & Plan:

## 2013-11-11 NOTE — Patient Instructions (Signed)
Get your blood work before you leave  Take medications as prescribed If you have fever, chills, increased rash: Go to the ER

## 2013-11-11 NOTE — Assessment & Plan Note (Addendum)
Since the last time he was here, he had an allergic reaction to Cardura -clonidin. LFTs and AP were elevated,  Hg slt low, creat slt up. hepatits markers neg U/s Showed gallbladder sludge. The patient continued itching. He is not taking any new medications, no OTC or herbs Plan: Repeat labs including PT PTT and a  retic count  Low dose of prednisone for itching Followup depending on results

## 2013-12-07 ENCOUNTER — Ambulatory Visit (INDEPENDENT_AMBULATORY_CARE_PROVIDER_SITE_OTHER): Payer: Medicare Other | Admitting: Internal Medicine

## 2013-12-07 ENCOUNTER — Encounter: Payer: Self-pay | Admitting: Internal Medicine

## 2013-12-07 ENCOUNTER — Other Ambulatory Visit: Payer: Self-pay | Admitting: Internal Medicine

## 2013-12-07 VITALS — BP 130/67 | HR 73 | Temp 97.9°F | Wt 159.0 lb

## 2013-12-07 DIAGNOSIS — T7840XA Allergy, unspecified, initial encounter: Secondary | ICD-10-CM

## 2013-12-07 LAB — HEPATIC FUNCTION PANEL
ALT: 43 U/L (ref 0–53)
AST: 33 U/L (ref 0–37)
Albumin: 3.3 g/dL — ABNORMAL LOW (ref 3.5–5.2)
Alkaline Phosphatase: 268 U/L — ABNORMAL HIGH (ref 39–117)
BILIRUBIN DIRECT: 1 mg/dL — AB (ref 0.0–0.3)
TOTAL PROTEIN: 6.9 g/dL (ref 6.0–8.3)
Total Bilirubin: 2.4 mg/dL — ABNORMAL HIGH (ref 0.3–1.2)

## 2013-12-07 NOTE — Assessment & Plan Note (Addendum)
See previous entries, here for followup . The patient still has mild itching, clinically still jaundice Will repeat LFTs today, if abnormalities persist will need : --GI consult particularly if the alkaline phosphatase elevated. MRCP? Further eval? -- d/c metformin-lipitor

## 2013-12-07 NOTE — Progress Notes (Signed)
Pre visit review using our clinic review tool, if applicable. No additional management support is needed unless otherwise documented below in the visit note. 

## 2013-12-07 NOTE — Patient Instructions (Signed)
Get your blood work before you leave   Next visit is for routine check up regards diabetes, hypertension, cholesterol  in 2 months  No need to come back fasting Please make an appointment

## 2013-12-07 NOTE — Progress Notes (Signed)
   Subjective:    Patient ID: Jim [name], male    DOB: 01/23/1932, 80 y.o.   MRN: [id number]  HPI F/u regards allergic reaction   Since the last time he was here, he took prednisone, itching decreased but after he finished the prednisone it resurface although is not as severe. Med list  Reviewed --> good compliance. No ambulatory blood sugars - BPs  Past Medical History  Diagnosis Date  . DM (diabetes mellitus) 10/2009  . 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 Abnormal chest xray     last XR 11-2010, no further  f/u CXRs  . Syncope 2010    saw cards   . Hyperlipidemia   . Hypertension   . Prostate cancer 2008    s/p XRT  . ED (erectile dysfunction)    Past Surgical History  Procedure Laterality Date  . Cataract extraction      bilaterally     Review of Systems Denies fever or chills. Appetite is normal with good tolerance. Denies nausea, vomiting, abdominal pain. No joint aches.    Objective:   Physical Exam BP 130/67  Pulse 73  Temp(Src) 97.9 F (36.6 C)  Wt 159 lb (72.122 kg)  SpO2 92% General -- alert, well-developed, NAD.  HEENT-- Not pale.still seems jaundice Lungs -- normal respiratory effort, no intercostal retractions, no accessory muscle use, and normal breath sounds.  Heart-- normal rate, regular rhythm, no murmur.  Abdomen-- Not distended, good bowel sounds,soft, non-tender. No organomegaly.  Extremities-- no pretibial edema bilaterally  Psych-- Cognition and judgment appear intact. Cooperative with normal attention span and concentration. No anxious or depressed appearing.      Assessment & Plan:

## 2013-12-20 DIAGNOSIS — D509 Iron deficiency anemia, unspecified: Secondary | ICD-10-CM | POA: Diagnosis not present

## 2013-12-20 DIAGNOSIS — R7402 Elevation of levels of lactic acid dehydrogenase (LDH): Secondary | ICD-10-CM | POA: Diagnosis not present

## 2013-12-20 DIAGNOSIS — D638 Anemia in other chronic diseases classified elsewhere: Secondary | ICD-10-CM | POA: Diagnosis not present

## 2014-01-05 ENCOUNTER — Ambulatory Visit: Payer: Medicare Other | Admitting: Internal Medicine

## 2014-01-20 DIAGNOSIS — D509 Iron deficiency anemia, unspecified: Secondary | ICD-10-CM | POA: Diagnosis not present

## 2014-01-20 DIAGNOSIS — D638 Anemia in other chronic diseases classified elsewhere: Secondary | ICD-10-CM | POA: Diagnosis not present

## 2014-01-20 DIAGNOSIS — R7402 Elevation of levels of lactic acid dehydrogenase (LDH): Secondary | ICD-10-CM | POA: Diagnosis not present

## 2014-01-23 ENCOUNTER — Other Ambulatory Visit: Payer: Self-pay | Admitting: Internal Medicine

## 2014-01-24 DIAGNOSIS — R7401 Elevation of levels of liver transaminase levels: Secondary | ICD-10-CM | POA: Diagnosis not present

## 2014-01-24 DIAGNOSIS — R7402 Elevation of levels of lactic acid dehydrogenase (LDH): Secondary | ICD-10-CM | POA: Diagnosis not present

## 2014-01-26 ENCOUNTER — Encounter: Payer: Self-pay | Admitting: Pulmonary Disease

## 2014-01-26 ENCOUNTER — Ambulatory Visit (INDEPENDENT_AMBULATORY_CARE_PROVIDER_SITE_OTHER): Payer: Medicare Other | Admitting: Pulmonary Disease

## 2014-01-26 VITALS — BP 130/80 | HR 64 | Temp 97.6°F | Ht 70.5 in | Wt 165.6 lb

## 2014-01-26 DIAGNOSIS — J449 Chronic obstructive pulmonary disease, unspecified: Secondary | ICD-10-CM | POA: Diagnosis not present

## 2014-01-26 NOTE — Progress Notes (Signed)
   Subjective:    Patient ID: Jim [name], male    DOB: Feb 28, 1932, 80 y.o.   MRN: [id number]  HPI The patient comes in today for followup of his known COPD. He is staying on his bronchodilator regimen, and continues to use oxygen at night. He feels that his exertional tolerance is at baseline, and has not changed much from the last visit. He has not had a recent pulmonary infection or acute exacerbation.   Review of Systems  Constitutional: Negative for fever and unexpected weight change.  HENT: Negative for congestion, dental problem, ear pain, nosebleeds, postnasal drip, rhinorrhea, sinus pressure, sneezing, sore throat and trouble swallowing.   Eyes: Negative for redness and itching.  Respiratory: Negative for cough, chest tightness, shortness of breath and wheezing.   Cardiovascular: Negative for palpitations and leg swelling.  Gastrointestinal: Negative for nausea and vomiting.  Genitourinary: Negative for dysuria.  Musculoskeletal: Negative for joint swelling.  Skin: Negative for rash.  Neurological: Negative for headaches.  Hematological: Does not bruise/bleed easily.  Psychiatric/Behavioral: Negative for dysphoric mood. The patient is not nervous/anxious.        Objective:   Physical Exam Thin male in no acute distress Nose without purulence or discharge noted Neck without lymphadenopathy or thyromegaly Chest with decreased breath sounds throughout, but no active wheezing or rhonchi Cardiac exam with regular rate and rhythm Lower extremities without edema, no cyanosis Alert and oriented, moves all 4 extremities.       Assessment & Plan:

## 2014-01-26 NOTE — Patient Instructions (Signed)
Continue with your same breathing medications.  If you see worsening of your breathing, let me know and we can consider additional medication that may help. Work on staying active Continue with your oxygen at night while sleeping. followup with me in 76mos.

## 2014-01-26 NOTE — Assessment & Plan Note (Signed)
The patient appears to be at a stable baseline from a COPD standpoint. I've asked him to continue on his current bronchodilator regimen, and that he needs to continue working on some type of exercise and conditioning program in order to maintain quality of life. He is to also continue oxygen at at bedtime.

## 2014-01-28 ENCOUNTER — Other Ambulatory Visit: Payer: Self-pay | Admitting: Internal Medicine

## 2014-01-28 DIAGNOSIS — J449 Chronic obstructive pulmonary disease, unspecified: Secondary | ICD-10-CM

## 2014-01-28 DIAGNOSIS — E119 Type 2 diabetes mellitus without complications: Secondary | ICD-10-CM

## 2014-01-30 NOTE — Telephone Encounter (Signed)
Refill for metformin sent to [facility] on [address]

## 2014-02-06 ENCOUNTER — Encounter: Payer: Self-pay | Admitting: Internal Medicine

## 2014-02-06 ENCOUNTER — Ambulatory Visit (INDEPENDENT_AMBULATORY_CARE_PROVIDER_SITE_OTHER): Payer: Medicare Other | Admitting: Internal Medicine

## 2014-02-06 VITALS — BP 142/77 | HR 69 | Temp 97.8°F | Ht 68.0 in | Wt 166.0 lb

## 2014-02-06 DIAGNOSIS — E119 Type 2 diabetes mellitus without complications: Secondary | ICD-10-CM | POA: Diagnosis not present

## 2014-02-06 DIAGNOSIS — K7689 Other specified diseases of liver: Secondary | ICD-10-CM

## 2014-02-06 DIAGNOSIS — I10 Essential (primary) hypertension: Secondary | ICD-10-CM | POA: Diagnosis not present

## 2014-02-06 DIAGNOSIS — K7589 Other specified inflammatory liver diseases: Secondary | ICD-10-CM

## 2014-02-06 LAB — HEMOGLOBIN A1C: Hgb A1c MFr Bld: 6.2 % (ref 4.6–6.5)

## 2014-02-06 NOTE — Progress Notes (Signed)
Pre visit review using our clinic review tool, if applicable. No additional management support is needed unless otherwise documented below in the visit note. 

## 2014-02-06 NOTE — Assessment & Plan Note (Addendum)
BP today is satisfactory, no change, continue taking the medications as listed, see "cholestatic hepatitis"  (had a  reaction to clonidine)

## 2014-02-06 NOTE — Assessment & Plan Note (Signed)
Good compliance of medication, check a hemoglobin A1c

## 2014-02-06 NOTE — Progress Notes (Signed)
Subjective:    Patient ID: Jim [name], male    DOB: [birth year], 80 y.o.   MRN: [id number]  DOS:  02/06/2014 Type of  visit:   ROV Since the last time he was here, saw GI in reference to increased LFTs. See assessment and plan. High blood pressure, ambulatory BPs around 148 when checked Diabetes, does not take ambulatory blood sugars   ROS Denies nausea, vomiting, diarrhea. Generalized itching is significantly decreased, essentially gone. Shortness or breath at baseline, cough and sputum production in the morning at baseline  Past Medical History  Diagnosis Date  . DM (diabetes mellitus) 10/2009  . COPD (chronic obstructive pulmonary disease)   . Abnormal chest xray     last XR 11-2010, no further  f/u CXRs  . Syncope 2010    saw cards   . Hyperlipidemia   . Hypertension   . Prostate cancer 2008    s/p XRT  . ED (erectile dysfunction)   . 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    Past Surgical History  Procedure Laterality Date  . Cataract extraction      bilaterally    History   Social History  . Marital Status: Divorced    Spouse Name: N/A    Number of Children: 2  . Years of Education: N/A   Occupational History  . retired     Social History Main Topics  . Smoking status: Former Smoker -- 2.00 packs/day for 40 years    Types: Cigarettes    Quit date: 11/24/2005  . Smokeless tobacco: Never Used  . Alcohol Use: 0.0 oz/week     Comment: rarely  . Drug Use: No  . Sexual Activity: Not on file   Other Topics Concern  . Not on file   Social History Narrative   Independent on ADL    Lives with son.   Has Gk and GGk        Medication List       This list is accurate as of: 02/06/14  5:28 PM.  Always use your most recent med list.               albuterol (2.5 MG/3ML) 0.083% nebulizer solution  Commonly known as:  PROVENTIL  Take 3 mLs (2.5 mg total) by nebulization 4 (four) times daily as needed.     aspirin 81 MG tablet    Take 81 mg by mouth daily.     atorvastatin 20 MG tablet  Commonly known as:  LIPITOR  take 1 tablet by mouth once daily     carvedilol 25 MG tablet  Commonly known as:  COREG  take 1 tablet by mouth twice a day WITH A MEAL.     cholestyramine 4 G packet  Commonly known as:  QUESTRAN  Take 4 g by mouth daily.     COMBIVENT RESPIMAT 20-100 MCG/ACT Aers respimat  Generic drug:  Ipratropium-Albuterol  inhale 1 puff by mouth every 6 hours if needed for wheezing     furosemide 20 MG tablet  Commonly known as:  LASIX  take 2 tablets by mouth daily     guaiFENesin 600 MG 12 hr tablet  Commonly known as:  MUCINEX  Take 600 mg by mouth daily.     losartan 100 MG tablet  Commonly known as:  COZAAR  take 1 tablet by mouth once daily     metFORMIN 500 MG tablet  Commonly known as:  GLUCOPHAGE  take 1 tablet by mouth twice a day with MEALS     predniSONE 10 MG tablet  Commonly known as:  DELTASONE  take 2 tablets by mouth once daily for 5 days     PRESERVISION AREDS PO  Take 2 tablets by mouth daily.           Objective:   Physical Exam BP 142/77  Pulse 69  Temp(Src) 97.8 F (36.6 C)  Ht 5\' 8"  (1.727 m)  Wt 166 lb (75.297 kg)  BMI 25.25 kg/m2  SpO2 95% General -- alert, well-developed, NAD.  HEENT-- Not pale or jaundice Lungs -- normal respiratory effort, no intercostal retractions, no accessory muscle use, and decreased breath sounds.  Heart-- normal rate, regular rhythm, no murmur.  Neurologic--  alert & oriented X3. Speech normal, gait normal, strength normal in all extremities.  Psych-- Cognition and judgment appear intact. Cooperative with normal attention span and concentration. No anxious or depressed appearing.        Assessment & Plan:

## 2014-02-06 NOTE — Patient Instructions (Signed)
Get your blood work before you leave   Next visit is for routine check up regards your blood sugar , blood pressure  in 4 months  No need to come back fasting Please make an appointment    Check the  blood pressure 2   times a week be sure it is between 110/60 and 140/85. Ideal blood pressure is 120/80. If it is consistently higher or lower, let me know

## 2014-02-06 NOTE — Assessment & Plan Note (Addendum)
Status post GI eval,dx w/  cholestatic hepatitis due to to drugs, aparently secondary to clonidine. I have reviewed the GI notes recently ( currently on transit to be scanned) Patient reports he is taking the medications as listed on today's note.

## 2014-02-07 ENCOUNTER — Telehealth: Payer: Self-pay | Admitting: Internal Medicine

## 2014-02-07 NOTE — Telephone Encounter (Signed)
Relevant patient education assigned to patient using Emmi. ° °

## 2014-02-08 ENCOUNTER — Encounter: Payer: Self-pay | Admitting: *Deleted

## 2014-03-05 ENCOUNTER — Other Ambulatory Visit: Payer: Self-pay | Admitting: Internal Medicine

## 2014-03-05 ENCOUNTER — Other Ambulatory Visit: Payer: Self-pay | Admitting: Pulmonary Disease

## 2014-03-06 ENCOUNTER — Telehealth: Payer: Self-pay | Admitting: *Deleted

## 2014-03-06 NOTE — Telephone Encounter (Signed)
rx refill - prednisone 10 mg  Last OV- 01/27/14

## 2014-03-06 NOTE — Telephone Encounter (Signed)
Pt states pharmacy must have requested it without asking him. Pt states hes fine.

## 2014-03-06 NOTE — Telephone Encounter (Signed)
Unable to RF,  that's for acute use only. If he has respiratory symptoms needs to be seen

## 2014-03-30 ENCOUNTER — Other Ambulatory Visit: Payer: Self-pay | Admitting: Internal Medicine

## 2014-04-03 DIAGNOSIS — R7402 Elevation of levels of lactic acid dehydrogenase (LDH): Secondary | ICD-10-CM | POA: Diagnosis not present

## 2014-04-04 NOTE — Telephone Encounter (Signed)
Combivent refilled per protocol. JG//CMA

## 2014-05-31 ENCOUNTER — Other Ambulatory Visit: Payer: Self-pay | Admitting: Internal Medicine

## 2014-06-08 ENCOUNTER — Encounter: Payer: Self-pay | Admitting: Internal Medicine

## 2014-06-08 ENCOUNTER — Ambulatory Visit (INDEPENDENT_AMBULATORY_CARE_PROVIDER_SITE_OTHER): Payer: Medicare Other | Admitting: Internal Medicine

## 2014-06-08 VITALS — BP 175/80 | HR 73 | Temp 97.8°F | Wt 171.0 lb

## 2014-06-08 DIAGNOSIS — K7689 Other specified diseases of liver: Secondary | ICD-10-CM

## 2014-06-08 DIAGNOSIS — I10 Essential (primary) hypertension: Secondary | ICD-10-CM | POA: Diagnosis not present

## 2014-06-08 DIAGNOSIS — E119 Type 2 diabetes mellitus without complications: Secondary | ICD-10-CM | POA: Diagnosis not present

## 2014-06-08 DIAGNOSIS — K7589 Other specified inflammatory liver diseases: Secondary | ICD-10-CM

## 2014-06-08 LAB — COMPREHENSIVE METABOLIC PANEL
ALK PHOS: 122 U/L — AB (ref 39–117)
ALT: 33 U/L (ref 0–53)
AST: 22 U/L (ref 0–37)
Albumin: 3.7 g/dL (ref 3.5–5.2)
BUN: 15 mg/dL (ref 6–23)
CALCIUM: 8.8 mg/dL (ref 8.4–10.5)
CO2: 31 mEq/L (ref 19–32)
CREATININE: 0.8 mg/dL (ref 0.4–1.5)
Chloride: 103 mEq/L (ref 96–112)
GFR: 93.47 mL/min (ref >60.00)
Glucose, Bld: 125 mg/dL — ABNORMAL HIGH (ref 70–99)
Potassium: 3.6 mEq/L (ref 3.5–5.1)
Sodium: 138 mEq/L (ref 135–145)
Total Bilirubin: 1 mg/dL (ref 0.2–1.2)
Total Protein: 6.9 g/dL (ref 6.0–8.3)

## 2014-06-08 NOTE — Progress Notes (Signed)
Subjective:    Patient ID: Jim [name], male    DOB: 1931/12/24, 80 y.o.   MRN: [id number]  DOS:  06/08/2014 Type of visit - description: Routine History: Increased LFTs, chart reviewed, see assessment and plan Diabetes, good compliance w/  medications. COPD, using medications appropriately, hardly ever uses rescue inhaler, has not use  a nebulizer in long time.    ROS Denies any problems with his vision. No lower extremity paresthesias, swelling. No chest pain, difficulty breathing at baseline  Past Medical History  Diagnosis Date  . DM (diabetes mellitus) 10/2009  . COPD (chronic obstructive pulmonary disease)   . Abnormal chest xray     last XR 11-2010, no further  f/u CXRs  . Syncope 2010    saw cards   . Hyperlipidemia   . Hypertension   . Prostate cancer 2008    s/p XRT  . ED (erectile dysfunction)   . 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    Past Surgical History  Procedure Laterality Date  . Cataract extraction      bilaterally    History   Social History  . Marital Status: Divorced    Spouse Name: N/A    Number of Children: 2  . Years of Education: N/A   Occupational History  . retired     Social History Main Topics  . Smoking status: Former Smoker -- 2.00 packs/day for 40 years    Types: Cigarettes    Quit date: 11/24/2005  . Smokeless tobacco: Never Used  . Alcohol Use: 0.0 oz/week     Comment: rarely  . Drug Use: No  . Sexual Activity: Not on file   Other Topics Concern  . Not on file   Social History Narrative   Independent on ADL    Lives with son.   Has Gk and GGk        Medication List       This list is accurate as of: 06/08/14  5:03 PM.  Always use your most recent med list.               albuterol (2.5 MG/3ML) 0.083% nebulizer solution  Commonly known as:  PROVENTIL  Take 3 mLs (2.5 mg total) by nebulization 4 (four) times daily as needed.     aspirin 81 MG tablet  Take 81 mg by mouth daily.     atorvastatin 20 MG tablet  Commonly known as:  LIPITOR  take 1 tablet by mouth once daily     carvedilol 25 MG tablet  Commonly known as:  COREG  take 1 tablet by mouth twice a day WITH A MEAL.     cholestyramine 4 G packet  Commonly known as:  QUESTRAN  Take 4 g by mouth daily.     COMBIVENT RESPIMAT 20-100 MCG/ACT Aers respimat  Generic drug:  Ipratropium-Albuterol  inhale 1 puff by mouth every 6 hours if needed for wheezing     furosemide 20 MG tablet  Commonly known as:  LASIX  take 2 tablets by mouth daily     guaiFENesin 600 MG 12 hr tablet  Commonly known as:  MUCINEX  Take 600 mg by mouth daily.     losartan 100 MG tablet  Commonly known as:  COZAAR  take 1 tablet by mouth once daily     metFORMIN 500 MG tablet  Commonly known as:  GLUCOPHAGE  take 1 tablet by mouth twice a day with MEALS  PRESERVISION AREDS PO  Take 2 tablets by mouth daily.     SYMBICORT 160-4.5 MCG/ACT inhaler  Generic drug:  budesonide-formoterol  inhale 2 puffs by mouth twice a day           Objective:   Physical Exam BP 175/80  Pulse 73  Temp(Src) 97.8 F (36.6 C)  Wt 171 lb (77.565 kg)  SpO2 93%  General -- alert, well-developed, NAD.  Lungs -- normal respiratory effort, no intercostal retractions, no accessory muscle use, and decreased  breath sounds.  Heart-- normal rate, regular rhythm, no murmur.  DIABETIC FEET EXAM: No lower extremity edema Normal pedal pulses bilaterally Skin normal, nails too long, no calluses Pinprick examination of the feet normal.  Neurologic--  alert & oriented X3. Speech normal, gait appropriate for age, strength symmetric and appropriate for age.  Psych-- Cognition and judgment appear intact. Cooperative with normal attention span and concentration. No anxious or depressed appearing.     Assessment & Plan:

## 2014-06-08 NOTE — Assessment & Plan Note (Addendum)
BP slightly elevated today. He has a number of problems with BP medications (abnormal LFTs) thus  instead of adjusting his regimen  today, I asked him to monitor his ambulatory BPs. See instructions

## 2014-06-08 NOTE — Progress Notes (Signed)
Pre visit review using our clinic review tool, if applicable. No additional management support is needed unless otherwise documented below in the visit note. 

## 2014-06-08 NOTE — Assessment & Plan Note (Signed)
Currently asymptomatic, no pruritus. Note from GI reviewed, problem  was felt to be related to BP medications. Labs from 12/2013  AP 145, AST, ALT normal. CBC normal Plan: Labs

## 2014-06-08 NOTE — Patient Instructions (Signed)
Get your blood work before you leave   Check the  blood pressure 2 or 3 times a   week be sure it is between 110/60 and 140/85. Ideal blood pressure is 120/80. If it is consistently higher or lower, let me know  Next visit is for a physical exam in 3 months  , fasting Please make an appointment       Diabetes and Foot Care Diabetes may cause you to have problems because of poor blood supply (circulation) to your feet and legs. This may cause the skin on your feet to become thinner, break easier, and heal more slowly. Your skin may become dry, and the skin may peel and crack. You may also have nerve damage in your legs and feet causing decreased feeling in them. You may not notice minor injuries to your feet that could lead to infections or more serious problems. Taking care of your feet is one of the most important things you can do for yourself.  HOME CARE INSTRUCTIONS  Wear shoes at all times, even in the house. Do not go barefoot. Bare feet are easily injured.  Check your feet daily for blisters, cuts, and redness. If you cannot see the bottom of your feet, use a mirror or ask someone for help.  Wash your feet with warm water (do not use hot water) and mild soap. Then pat your feet and the areas between your toes until they are completely dry. Do not soak your feet as this can dry your skin.  Apply a moisturizing lotion or petroleum jelly (that does not contain alcohol and is unscented) to the skin on your feet and to dry, brittle toenails. Do not apply lotion between your toes.  Trim your toenails straight across. Do not dig under them or around the cuticle. File the edges of your nails with an emery board or nail file.  Do not cut corns or calluses or try to remove them with medicine.  Wear clean socks or stockings every day. Make sure they are not too tight. Do not wear knee-high stockings since they may decrease blood flow to your legs.  Wear shoes that fit properly and have enough  cushioning. To break in new shoes, wear them for just a few hours a day. This prevents you from injuring your feet. Always look in your shoes before you put them on to be sure there are no objects inside.  Do not cross your legs. This may decrease the blood flow to your feet.  If you find a minor scrape, cut, or break in the skin on your feet, keep it and the skin around it clean and dry. These areas may be cleansed with mild soap and water. Do not cleanse the area with peroxide, alcohol, or iodine.  When you remove an adhesive bandage, be sure not to damage the skin around it.  If you have a wound, look at it several times a day to make sure it is healing.  Do not use heating pads or hot water bottles. They may burn your skin. If you have lost feeling in your feet or legs, you may not know it is happening until it is too late.  Make sure your health care provider performs a complete foot exam at least annually or more often if you have foot problems. Report any cuts, sores, or bruises to your health care provider immediately. SEEK MEDICAL CARE IF:   You have an injury that is not healing.  You have cuts or breaks in the skin.  You have an ingrown nail.  You notice redness on your legs or feet.  You feel burning or tingling in your legs or feet.  You have pain or cramps in your legs and feet.  Your legs or feet are numb.  Your feet always feel cold. SEEK IMMEDIATE MEDICAL CARE IF:   There is increasing redness, swelling, or pain in or around a wound.  There is a red line that goes up your leg.  Pus is coming from a wound.  You develop a fever or as directed by your health care provider.  You notice a bad smell coming from an ulcer or wound. Document Released: 11/07/2000 Document Revised: 07/13/2013 Document Reviewed: 04/19/2013 [facility] Patient Information 2015 [place], [place]. This information is not intended to replace advice given to you by your health care provider. Make  sure you discuss any questions you have with your health care provider.

## 2014-06-08 NOTE — Assessment & Plan Note (Addendum)
eye md appointment pending for  8-15  No paresthesias , Feet exam negative. Feet care discussed

## 2014-06-09 ENCOUNTER — Encounter: Payer: Self-pay | Admitting: *Deleted

## 2014-06-21 ENCOUNTER — Other Ambulatory Visit: Payer: Self-pay | Admitting: Internal Medicine

## 2014-07-12 DIAGNOSIS — H353290 Exudative age-related macular degeneration, unspecified eye, stage unspecified: Secondary | ICD-10-CM | POA: Diagnosis not present

## 2014-07-12 DIAGNOSIS — Z961 Presence of intraocular lens: Secondary | ICD-10-CM | POA: Diagnosis not present

## 2014-07-12 DIAGNOSIS — E119 Type 2 diabetes mellitus without complications: Secondary | ICD-10-CM | POA: Diagnosis not present

## 2014-07-12 DIAGNOSIS — H02839 Dermatochalasis of unspecified eye, unspecified eyelid: Secondary | ICD-10-CM | POA: Diagnosis not present

## 2014-07-12 DIAGNOSIS — H35379 Puckering of macula, unspecified eye: Secondary | ICD-10-CM | POA: Diagnosis not present

## 2014-07-17 ENCOUNTER — Encounter (INDEPENDENT_AMBULATORY_CARE_PROVIDER_SITE_OTHER): Payer: Medicare Other | Admitting: Ophthalmology

## 2014-07-17 ENCOUNTER — Encounter (HOSPITAL_COMMUNITY): Payer: Self-pay | Admitting: Emergency Medicine

## 2014-07-17 ENCOUNTER — Emergency Department (HOSPITAL_COMMUNITY)
Admission: EM | Admit: 2014-07-17 | Discharge: 2014-07-18 | Disposition: A | Discharge status: Home / Self Care | Payer: Medicare Other | Attending: Emergency Medicine | Admitting: Emergency Medicine

## 2014-07-17 DIAGNOSIS — H353290 Exudative age-related macular degeneration, unspecified eye, stage unspecified: Secondary | ICD-10-CM | POA: Diagnosis not present

## 2014-07-17 DIAGNOSIS — Z7982 Long term (current) use of aspirin: Secondary | ICD-10-CM | POA: Diagnosis not present

## 2014-07-17 DIAGNOSIS — I10 Essential (primary) hypertension: Secondary | ICD-10-CM

## 2014-07-17 DIAGNOSIS — E119 Type 2 diabetes mellitus without complications: Secondary | ICD-10-CM | POA: Diagnosis not present

## 2014-07-17 DIAGNOSIS — H33309 Unspecified retinal break, unspecified eye: Secondary | ICD-10-CM

## 2014-07-17 DIAGNOSIS — J4489 Other specified chronic obstructive pulmonary disease: Secondary | ICD-10-CM | POA: Insufficient documentation

## 2014-07-17 DIAGNOSIS — Z8546 Personal history of malignant neoplasm of prostate: Secondary | ICD-10-CM | POA: Diagnosis not present

## 2014-07-17 DIAGNOSIS — Z79899 Other long term (current) drug therapy: Secondary | ICD-10-CM | POA: Diagnosis not present

## 2014-07-17 DIAGNOSIS — H35039 Hypertensive retinopathy, unspecified eye: Secondary | ICD-10-CM

## 2014-07-17 DIAGNOSIS — E1165 Type 2 diabetes mellitus with hyperglycemia: Secondary | ICD-10-CM | POA: Diagnosis not present

## 2014-07-17 DIAGNOSIS — E1139 Type 2 diabetes mellitus with other diabetic ophthalmic complication: Secondary | ICD-10-CM

## 2014-07-17 DIAGNOSIS — E785 Hyperlipidemia, unspecified: Secondary | ICD-10-CM | POA: Diagnosis not present

## 2014-07-17 DIAGNOSIS — J449 Chronic obstructive pulmonary disease, unspecified: Secondary | ICD-10-CM | POA: Diagnosis not present

## 2014-07-17 DIAGNOSIS — Z87891 Personal history of nicotine dependence: Secondary | ICD-10-CM | POA: Diagnosis not present

## 2014-07-17 DIAGNOSIS — E11319 Type 2 diabetes mellitus with unspecified diabetic retinopathy without macular edema: Secondary | ICD-10-CM

## 2014-07-17 DIAGNOSIS — H3530 Unspecified macular degeneration: Secondary | ICD-10-CM

## 2014-07-17 HISTORY — PX: EYE SURGERY: SHX253

## 2014-07-17 LAB — CBC WITH DIFFERENTIAL/PLATELET
BASOS ABS: 0.1 10*3/uL (ref 0.0–0.1)
BASOS PCT: 1 % (ref 0–1)
Eosinophils Absolute: 0.7 10*3/uL (ref 0.0–0.7)
Eosinophils Relative: 8 % — ABNORMAL HIGH (ref 0–5)
HEMATOCRIT: 43.1 % (ref 39.0–52.0)
Hemoglobin: 14.9 g/dL (ref 13.0–17.0)
Lymphocytes Relative: 20 % (ref 12–46)
Lymphs Abs: 1.8 10*3/uL (ref 0.7–4.0)
MCH: 32.4 pg (ref 26.0–34.0)
MCHC: 34.6 g/dL (ref 30.0–36.0)
MCV: 93.7 fL (ref 78.0–100.0)
MONO ABS: 0.6 10*3/uL (ref 0.1–1.0)
Monocytes Relative: 7 % (ref 3–12)
NEUTROS PCT: 64 % (ref 43–77)
Neutro Abs: 5.9 10*3/uL (ref 1.7–7.7)
Platelets: 204 10*3/uL (ref 150–400)
RBC: 4.6 MIL/uL (ref 4.22–5.81)
RDW: 13.2 % (ref 11.5–15.5)
WBC: 9.1 10*3/uL (ref 4.0–10.5)

## 2014-07-17 LAB — BASIC METABOLIC PANEL
Anion gap: 14 (ref 5–15)
BUN: 10 mg/dL (ref 6–23)
CO2: 24 mEq/L (ref 19–32)
CREATININE: 0.95 mg/dL (ref 0.50–1.35)
Calcium: 9.1 mg/dL (ref 8.4–10.5)
Chloride: 102 mEq/L (ref 96–112)
GFR, EST AFRICAN AMERICAN: 89 mL/min — AB (ref >90)
GFR, EST NON AFRICAN AMERICAN: 77 mL/min — AB (ref >90)
Glucose, Bld: 145 mg/dL — ABNORMAL HIGH (ref 70–99)
Potassium: 4.3 mEq/L (ref 3.7–5.3)
Sodium: 140 mEq/L (ref 137–147)

## 2014-07-17 MED ORDER — METOPROLOL TARTRATE 1 MG/ML IV SOLN
5.0000 mg | Freq: Once | INTRAVENOUS | Status: AC
  Administered 2014-07-18: 5 mg via INTRAVENOUS
  Filled 2014-07-17: qty 5

## 2014-07-17 NOTE — ED Provider Notes (Signed)
CSN: [id number]     Arrival date & time 07/17/14  2106 History   First MD Initiated Contact with Patient 07/17/14 2307     Chief Complaint  Patient presents with  . Hypertension     (Consider location/radiation/quality/duration/timing/severity/associated sxs/prior Treatment) HPI Comments: 80 year old male with history prostate cancer, lipids, high blood pressure on carvedilol, past smoker, COPD presents with high blood pressure. Patient had laser surgery left eye this morning it went well and he has no pain fever swelling or other symptoms patient noticed the past week his blood pressures been higher than normal which is normally 140s. Patient taking his medicines as directed. Patient has followup with primary Dr. Patient denies systemic symptoms including chest pain, headache, shortness of breath or neuro symptoms. Patient feels well except for the number.  The history is provided by the patient.    Past Medical History  Diagnosis Date  . DM (diabetes mellitus) 10/2009  . COPD (chronic obstructive pulmonary disease)   . Abnormal chest xray     last XR 11-2010, no further  f/u CXRs  . Syncope 2010    saw cards   . Hyperlipidemia   . Hypertension   . Prostate cancer 2008    s/p XRT  . ED (erectile dysfunction)   . 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   Past Surgical History  Procedure Laterality Date  . Cataract extraction      bilaterally   Family History  Problem Relation Age of Onset  . Cirrhosis Brother     liver  . Dementia Mother   . Colon cancer Neg Hx   . Prostate cancer Neg Hx   . CAD Neg Hx    History  Substance Use Topics  . Smoking status: Former Smoker -- 2.00 packs/day for 40 years    Types: Cigarettes    Quit date: 11/24/2005  . Smokeless tobacco: Never Used  . Alcohol Use: 0.0 oz/week     Comment: rarely    Review of Systems  Constitutional: Negative for fever and chills.  HENT: Negative for congestion.   Eyes: Negative for visual  disturbance.  Respiratory: Negative for shortness of breath.   Cardiovascular: Negative for chest pain.  Gastrointestinal: Negative for vomiting and abdominal pain.  Genitourinary: Negative for dysuria and flank pain.  Musculoskeletal: Negative for back pain, neck pain and neck stiffness.  Skin: Negative for rash.  Neurological: Negative for light-headedness and headaches.      Allergies  Amlodipine besylate; Cardura; Clonidine derivatives; and Fluticasone-salmeterol  Home Medications   Prior to Admission medications   Medication Sig Start Date End Date Taking? Authorizing Provider  albuterol (PROVENTIL) (2.5 MG/3ML) 0.083% nebulizer solution Take 3 mLs (2.5 mg total) by nebulization 4 (four) times daily as needed. 10/26/13  Yes Colon Branch, MD  aspirin 81 MG tablet Take 81 mg by mouth daily.     Yes Historical Provider, MD  atorvastatin (LIPITOR) 20 MG tablet take 1 tablet by mouth once daily 06/21/14  Yes Colon Branch, MD  carvedilol (COREG) 25 MG tablet take 1 tablet by mouth twice a day with meals 06/21/14  Yes Colon Branch, MD  cholestyramine Lucrezia Starch) 4 G packet Take 4 g by mouth daily.   Yes Historical Provider, MD  COMBIVENT RESPIMAT 20-100 MCG/ACT AERS respimat inhale 1 puff by mouth every 6 hours if needed for wheezing 05/31/14  Yes Colon Branch, MD  furosemide (LASIX) 20 MG tablet Take 40 mg by  mouth daily.   Yes Historical Provider, MD  guaiFENesin (MUCINEX) 600 MG 12 hr tablet Take 600 mg by mouth daily.     Yes Historical Provider, MD  losartan (COZAAR) 100 MG tablet take 1 tablet by mouth once daily 06/21/14  Yes Colon Branch, MD  metFORMIN (GLUCOPHAGE) 500 MG tablet Take 500 mg by mouth 2 (two) times daily.   Yes Historical Provider, MD  Multiple Vitamins-Minerals (PRESERVISION AREDS PO) Take 2 tablets by mouth daily.     Yes Historical Provider, MD  SYMBICORT 160-4.5 MCG/ACT inhaler inhale 2 puffs by mouth twice a day   Yes Kathee Delton, MD   BP 208/145  Pulse 72  Temp(Src)  98.7 F (37.1 C) (Oral)  Resp 20  SpO2 94% Physical Exam  Nursing note and vitals reviewed. Constitutional: He is oriented to person, place, and time. He appears well-developed and well-nourished.  HENT:  Head: Normocephalic and atraumatic.  Eyes: Conjunctivae are normal. Right eye exhibits no discharge. Left eye exhibits no discharge.  Pupils equal bilateral eczema the muscle function intact visual fields intact.  Neck: Normal range of motion. Neck supple. No tracheal deviation present.  Cardiovascular: Normal rate, regular rhythm and intact distal pulses.   Pulmonary/Chest: Effort normal and breath sounds normal.  Abdominal: Soft. He exhibits no distension. There is no tenderness. There is no guarding.  Musculoskeletal: He exhibits no edema and no tenderness.  Neurological: He is alert and oriented to person, place, and time.  Skin: Skin is warm. No rash noted.  Psychiatric: He has a normal mood and affect.    ED Course  Procedures (including critical care time) Labs Review Labs Reviewed  CBC WITH DIFFERENTIAL - Abnormal; Notable for the following:    Eosinophils Relative 8 (*)    All other components within normal limits  BASIC METABOLIC PANEL - Abnormal; Notable for the following:    Glucose, Bld 145 (*)    GFR calc non Af Amer 77 (*)    GFR calc Af Amer 89 (*)    All other components within normal limits    Imaging Review No results found.   EKG Interpretation None      MDM   Final diagnoses:  Essential hypertension   Well-appearing male comfortable in ER with elevated blood pressure 454 systolic. Patient has no symptoms or signs of endorgan damage. Metoprolol given in the ER and decreased blood pressure to 180s, I do not feel comfortable nor do I think it's indicated to drop it any further at this time. Patient has followup with primary Dr. and will call to him oral for blood pressure management adjustment. Reasons to return given.  Results and differential  diagnosis were discussed with the patient/parent/guardian. Close follow up outpatient was discussed, comfortable with the plan.   Medications  metoprolol (LOPRESSOR) injection 5 mg (5 mg Intravenous Given 07/18/14 0003)  metoprolol (LOPRESSOR) injection 5 mg (5 mg Intravenous Given 07/18/14 0124)    Filed Vitals:   07/18/14 0100 07/18/14 0110 07/18/14 0120 07/18/14 0130  BP: 206/101 182/107 185/101 183/97  Pulse: 67 66 66 66  Temp:      TempSrc:      Resp: 15 15 17 16   SpO2: 94% 94% 94% 94%         Mariea Clonts, MD 07/18/14 [phone]

## 2014-07-17 NOTE — ED Notes (Signed)
Pt states he had laser eye surgery done this morning  Pt states he checked his blood pressure this evening and it was 228/115 and so they went to [facility] and checked it and it read about the same  Pt states he feels ok

## 2014-07-17 NOTE — ED Notes (Signed)
Pt had laser surgery on L eye this morning, states he has a needle that was inserted in his eye, supposed to have it taken out Friday morning. This evening he noticed his BP was high. Denies any sx. No headache, no nausea, no lightheadedness, no weakness, no pain.

## 2014-07-18 ENCOUNTER — Ambulatory Visit (INDEPENDENT_AMBULATORY_CARE_PROVIDER_SITE_OTHER): Payer: Medicare Other | Admitting: Internal Medicine

## 2014-07-18 ENCOUNTER — Encounter: Payer: Self-pay | Admitting: Internal Medicine

## 2014-07-18 VITALS — BP 140/82 | HR 71 | Temp 97.8°F | Wt 174.0 lb

## 2014-07-18 DIAGNOSIS — I10 Essential (primary) hypertension: Secondary | ICD-10-CM | POA: Diagnosis not present

## 2014-07-18 MED ORDER — METOPROLOL TARTRATE 1 MG/ML IV SOLN
5.0000 mg | Freq: Once | INTRAVENOUS | Status: AC
  Administered 2014-07-18: 5 mg via INTRAVENOUS
  Filled 2014-07-18: qty 5

## 2014-07-18 NOTE — Progress Notes (Signed)
Pre-visit discussion using our clinic review tool. No additional management support is needed unless otherwise documented below in the visit note.  

## 2014-07-18 NOTE — Progress Notes (Signed)
Subjective:    Patient ID: Jim [name], male    DOB: [birth year], 80 y.o.   MRN: [id number]  DOS:  07/18/2014 Type of visit - description: ER f/u History: The patient had eye  laser surgery   yesterday, he received a number of eyedrops at that time, Later on that day he decided to take his BP and it was in the 200s. He was asymptomatic, went to the ER, initial BP in the ER was 208/145, he received metoprolol, at around 1 AM in the morning BP drop to 183/97. Additionally, the ER check a CBC and a BMP which were normal. The patient was asymptomatic throughout this time and he is here for followup. Reports good compliance with all medications, the only new medication   is taking is steroids eyedrops. In the weeks prior to this problem, BP has been 140, 150 and 160 one time.   ROS Denies chest pain or difficulty breathing No nausea or vomiting. No headaches. Denies taking Motrin or similar medications, no increase in sodium intake  Past Medical History  Diagnosis Date  . DM (diabetes mellitus) 10/2009  . COPD (chronic obstructive pulmonary disease)   . Abnormal chest xray     last XR 11-2010, no further  f/u CXRs  . Syncope 2010    saw cards   . Hyperlipidemia   . Hypertension   . Prostate cancer 2008    s/p XRT  . ED (erectile dysfunction)   . 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    Past Surgical History  Procedure Laterality Date  . Cataract extraction      bilaterally  . Eye surgery Left 07/17/2014    History   Social History  . Marital Status: Divorced    Spouse Name: N/A    Number of Children: 2  . Years of Education: N/A   Occupational History  . retired     Social History Main Topics  . Smoking status: Former Smoker -- 2.00 packs/day for 40 years    Types: Cigarettes    Quit date: 11/24/2005  . Smokeless tobacco: Never Used  . Alcohol Use: 0.0 oz/week     Comment: rarely  . Drug Use: No  . Sexual Activity: Not on file   Other  Topics Concern  . Not on file   Social History Narrative   Independent on ADL    Lives with son.   Has Gk and GGk        Medication List       This list is accurate as of: 07/18/14 11:59 PM.  Always use your most recent med list.               albuterol (2.5 MG/3ML) 0.083% nebulizer solution  Commonly known as:  PROVENTIL  Take 3 mLs (2.5 mg total) by nebulization 4 (four) times daily as needed.     aspirin 81 MG tablet  Take 81 mg by mouth daily.     atorvastatin 20 MG tablet  Commonly known as:  LIPITOR  take 1 tablet by mouth once daily     carvedilol 25 MG tablet  Commonly known as:  COREG  take 1 tablet by mouth twice a day with meals     cholestyramine 4 G packet  Commonly known as:  QUESTRAN  Take 4 g by mouth daily.     COMBIVENT RESPIMAT 20-100 MCG/ACT Aers respimat  Generic drug:  Ipratropium-Albuterol  inhale 1 puff by  mouth every 6 hours if needed for wheezing     furosemide 20 MG tablet  Commonly known as:  LASIX  Take 40 mg by mouth daily.     guaiFENesin 600 MG 12 hr tablet  Commonly known as:  MUCINEX  Take 600 mg by mouth daily.     losartan 100 MG tablet  Commonly known as:  COZAAR  take 1 tablet by mouth once daily     loteprednol 0.5 % ophthalmic suspension  Commonly known as:  LOTEMAX  Place 1 drop into the left eye 3 (three) times daily. For 2 weeks     metFORMIN 500 MG tablet  Commonly known as:  GLUCOPHAGE  Take 500 mg by mouth 2 (two) times daily.     PRESERVISION AREDS PO  Take 2 tablets by mouth daily.     SYMBICORT 160-4.5 MCG/ACT inhaler  Generic drug:  budesonide-formoterol  inhale 2 puffs by mouth twice a day     SYSTANE BALANCE OP  Apply 1 drop to eye daily as needed.           Objective:   Physical Exam BP 140/82  Pulse 71  Temp(Src) 97.8 F (36.6 C) (Oral)  Wt 174 lb (78.926 kg)  SpO2 92%  General -- alert, well-developed, NAD.   Lungs -- normal respiratory effort, no intercostal retractions, no  accessory muscle use, and decread breath sounds.  Heart-- normal rate, regular rhythm, no murmur.   Extremities-- no pretibial edema bilaterally  Neurologic--  alert & oriented X3. Speech normal, gait appropriate for age, strength symmetric and appropriate for age. (all at baseline) Psych-- Cognition and judgment appear intact. Cooperative with normal attention span and concentration. No anxious or depressed appearing.       Assessment & Plan:    Today , I spent more than 25   min with the patient: >50% of the time counseling regards BP mngmt, diet, meds Also reviewing the chart and labs ordered by other providers

## 2014-07-18 NOTE — Patient Instructions (Signed)
Continue the same medications including your eyedrops Lotemax For the next 2 weeks, take your blood pressure twice a day If it is consistently more than 160 please call the office. Also call us  if he chest pain, headaches. Call in 3 days, talk to my nurse and gave her your  BP readings

## 2014-07-18 NOTE — Assessment & Plan Note (Signed)
Seen yesterday at the ER with a quite elevated but asymptomatic blood pressure in the context of recent eye surgery where he received a number of eyedrops. BP now is better. Recommend close followup, see instructions . Ok to continue [place] lotamax (I don't believe they are increasing his BP)

## 2014-07-18 NOTE — Discharge Instructions (Signed)
See your Dr. tomorrow to discuss changing or increasing her blood pressure medications. Come to the ER she develop headache, chest pain, stroke symptoms or other.  If you were given medicines take as directed.  If you are on coumadin or contraceptives realize their levels and effectiveness is altered by many different medicines.  If you have any reaction (rash, tongues swelling, other) to the medicines stop taking and see a physician.   Please follow up as directed and return to the ER or see a physician for new or worsening symptoms.  Thank you. Filed Vitals:   07/18/14 0040 07/18/14 0100 07/18/14 0110 07/18/14 0120  BP: 200/103 206/101 182/107 185/101  Pulse: 68 67 66 66  Temp:      TempSrc:      Resp: 15 15 15 17   SpO2: 93% 94% 94% 94%    Hypertension Hypertension, commonly called high blood pressure, is when the force of blood pumping through your arteries is too strong. Your arteries are the blood vessels that carry blood from your heart throughout your body. A blood pressure reading consists of a higher number over a lower number, such as 110/72. The higher number (systolic) is the pressure inside your arteries when your heart pumps. The lower number (diastolic) is the pressure inside your arteries when your heart relaxes. Ideally you want your blood pressure below 120/80. Hypertension forces your heart to work harder to pump blood. Your arteries may become narrow or stiff. Having hypertension puts you at risk for heart disease, stroke, and other problems.  RISK FACTORS Some risk factors for high blood pressure are controllable. Others are not.  Risk factors you cannot control include:   Race. You may be at higher risk if you are African American.  Age. Risk increases with age.  Gender. Men are at higher risk than women before age 58 years. After age 38, women are at higher risk than men. Risk factors you can control include:  Not getting enough exercise or physical  activity.  Being overweight.  Getting too much fat, sugar, calories, or salt in your diet.  Drinking too much alcohol. SIGNS AND SYMPTOMS Hypertension does not usually cause signs or symptoms. Extremely high blood pressure (hypertensive crisis) may cause headache, anxiety, shortness of breath, and nosebleed. DIAGNOSIS  To check if you have hypertension, your health care provider will measure your blood pressure while you are seated, with your arm held at the level of your heart. It should be measured at least twice using the same arm. Certain conditions can cause a difference in blood pressure between your right and left arms. A blood pressure reading that is higher than normal on one occasion does not mean that you need treatment. If one blood pressure reading is high, ask your health care provider about having it checked again. TREATMENT  Treating high blood pressure includes making lifestyle changes and possibly taking medicine. Living a healthy lifestyle can help lower high blood pressure. You may need to change some of your habits. Lifestyle changes may include:  Following the DASH diet. This diet is high in fruits, vegetables, and whole grains. It is low in salt, red meat, and added sugars.  Getting at least 2 hours of brisk physical activity every week.  Losing weight if necessary.  Not smoking.  Limiting alcoholic beverages.  Learning ways to reduce stress. If lifestyle changes are not enough to get your blood pressure under control, your health care provider may prescribe medicine. You may need to  take more than one. Work closely with your health care provider to understand the risks and benefits. HOME CARE INSTRUCTIONS  Have your blood pressure rechecked as directed by your health care provider.   Take medicines only as directed by your health care provider. Follow the directions carefully. Blood pressure medicines must be taken as prescribed. The medicine does not work as  well when you skip doses. Skipping doses also puts you at risk for problems.   Do not smoke.   Monitor your blood pressure at home as directed by your health care provider. SEEK MEDICAL CARE IF:   You think you are having a reaction to medicines taken.  You have recurrent headaches or feel dizzy.  You have swelling in your ankles.  You have trouble with your vision. SEEK IMMEDIATE MEDICAL CARE IF:  You develop a severe headache or confusion.  You have unusual weakness, numbness, or feel faint.  You have severe chest or abdominal pain.  You vomit repeatedly.  You have trouble breathing. MAKE SURE YOU:   Understand these instructions.  Will watch your condition.  Will get help right away if you are not doing well or get worse. Document Released: 11/10/2005 Document Revised: 03/27/2014 Document Reviewed: 09/02/2013 [facility] Patient Information 2015 [place], [place]. This information is not intended to replace advice given to you by your health care provider. Make sure you discuss any questions you have with your health care provider.

## 2014-07-19 ENCOUNTER — Other Ambulatory Visit: Payer: Self-pay | Admitting: Internal Medicine

## 2014-07-21 ENCOUNTER — Telehealth: Payer: Self-pay

## 2014-07-21 ENCOUNTER — Encounter (INDEPENDENT_AMBULATORY_CARE_PROVIDER_SITE_OTHER): Payer: Medicare Other | Admitting: Ophthalmology

## 2014-07-21 NOTE — Telephone Encounter (Signed)
Jim [name] [phone] Esc[facility] called to give results of 4 day BP   Tuesday  134/72     137/42 Wednesday 139/76  150/70 Thursday 191/98  139/71 Friday  177/92  158/83

## 2014-07-21 NOTE — Telephone Encounter (Signed)
Pt has called with last few BP readings. See phone note below.

## 2014-07-24 NOTE — Telephone Encounter (Signed)
Spoke with Pt and call back with BP readings in 10 days.

## 2014-07-24 NOTE — Telephone Encounter (Signed)
BP elevated for the last couple of days, advise patient no change, continue monitoring BP, call in 10 days w/ readings

## 2014-07-26 ENCOUNTER — Other Ambulatory Visit: Payer: Self-pay | Admitting: Internal Medicine

## 2014-08-02 ENCOUNTER — Encounter: Payer: Self-pay | Admitting: Pulmonary Disease

## 2014-08-02 ENCOUNTER — Ambulatory Visit (INDEPENDENT_AMBULATORY_CARE_PROVIDER_SITE_OTHER): Payer: Medicare Other | Admitting: Pulmonary Disease

## 2014-08-02 ENCOUNTER — Encounter (INDEPENDENT_AMBULATORY_CARE_PROVIDER_SITE_OTHER): Payer: Medicare Other | Admitting: Ophthalmology

## 2014-08-02 VITALS — BP 122/64 | HR 63 | Temp 98.0°F | Ht 68.0 in | Wt 175.6 lb

## 2014-08-02 DIAGNOSIS — H33309 Unspecified retinal break, unspecified eye: Secondary | ICD-10-CM | POA: Diagnosis not present

## 2014-08-02 DIAGNOSIS — H3530 Unspecified macular degeneration: Secondary | ICD-10-CM | POA: Diagnosis not present

## 2014-08-02 DIAGNOSIS — H353290 Exudative age-related macular degeneration, unspecified eye, stage unspecified: Secondary | ICD-10-CM | POA: Diagnosis not present

## 2014-08-02 DIAGNOSIS — J439 Emphysema, unspecified: Secondary | ICD-10-CM

## 2014-08-02 DIAGNOSIS — J438 Other emphysema: Secondary | ICD-10-CM | POA: Diagnosis not present

## 2014-08-02 NOTE — Assessment & Plan Note (Signed)
The patient appears to be doing well on his current bronchodilator regimen for his gold b copd.  I have asked him to continue on his current medications, and also to work on some type of conditioning/exercise program. I will see him back in 6 months

## 2014-08-02 NOTE — Patient Instructions (Signed)
Continue on current medications. Stay active Make sure you get your flu shot by October. followup with me again in 55mos.

## 2014-08-02 NOTE — Progress Notes (Signed)
   Subjective:    Patient ID: Jim [name], male    DOB: Mar 27, 1932, 80 y.o.   MRN: [id number]  HPI The patient comes in today for followup of his known COPD with chronic respiratory failure. He is doing well on his current medical regimen, and has not had an acute exacerbation or pulmonary infection since the last visit. He feels that his exertional tolerance is at baseline, and denies any significant cough or congestion. I discussed with him getting the flu shot, and he would like to defer until October.   Review of Systems  Constitutional: Negative for fever and unexpected weight change.  HENT: Negative for congestion, dental problem, ear pain, nosebleeds, postnasal drip, rhinorrhea, sinus pressure, sneezing, sore throat and trouble swallowing.   Eyes: Negative for redness and itching.  Respiratory: Positive for shortness of breath. Negative for cough, chest tightness and wheezing.   Cardiovascular: Negative for palpitations and leg swelling.  Gastrointestinal: Negative for nausea and vomiting.  Genitourinary: Negative for dysuria.  Musculoskeletal: Negative for joint swelling.  Skin: Negative for rash.  Neurological: Negative for headaches.  Hematological: Does not bruise/bleed easily.  Psychiatric/Behavioral: Negative for dysphoric mood. The patient is not nervous/anxious.        Objective:   Physical Exam Well-developed male in no acute distress Nose without purulence or discharge noted Neck without lymphadenopathy or thyromegaly Chest with decreased breath sounds, but adequate airflow with no wheezing Heart exam with regular rate and rhythm Lower extremities with no significant edema, no cyanosis Alert and oriented, moves all 4 extremities.       Assessment & Plan:

## 2014-08-08 ENCOUNTER — Telehealth: Payer: Self-pay | Admitting: Internal Medicine

## 2014-08-08 DIAGNOSIS — Z23 Encounter for immunization: Secondary | ICD-10-CM | POA: Diagnosis not present

## 2014-08-08 MED ORDER — FUROSEMIDE 20 MG PO TABS: ORAL_TABLET | ORAL | Status: DC

## 2014-08-08 NOTE — Telephone Encounter (Signed)
Patient symptoms and now with recent BPs: Numbers in the 140s-160s, one time 119 Diastolic BP usually 70, 80. At this point, I recommend to increase Lasix 20 mg to 3 tablets daily. Goal: most BPs below 150 Call w/  BP readings in 3 weeks

## 2014-08-08 NOTE — Telephone Encounter (Signed)
Spoke with Pt, instructed him to begin taking Lasix 3 times daily, new prescription sent to [facility]. Informed him to continue taking BP's at home and call us with readings in 3 weeks.

## 2014-08-12 ENCOUNTER — Inpatient Hospital Stay (HOSPITAL_COMMUNITY)
Admission: EM | Admit: 2014-08-12 | Discharge: 2014-08-23 | DRG: 417 | Disposition: A | Discharge status: Home Health | Payer: Medicare Other | Attending: Internal Medicine | Admitting: Internal Medicine

## 2014-08-12 ENCOUNTER — Encounter (HOSPITAL_COMMUNITY): Payer: Self-pay | Admitting: Emergency Medicine

## 2014-08-12 ENCOUNTER — Emergency Department (HOSPITAL_COMMUNITY): Payer: Medicare Other

## 2014-08-12 DIAGNOSIS — R0600 Dyspnea, unspecified: Secondary | ICD-10-CM

## 2014-08-12 DIAGNOSIS — D4959 Neoplasm of unspecified behavior of other genitourinary organ: Secondary | ICD-10-CM | POA: Diagnosis not present

## 2014-08-12 DIAGNOSIS — K8309 Other cholangitis: Secondary | ICD-10-CM | POA: Diagnosis present

## 2014-08-12 DIAGNOSIS — J449 Chronic obstructive pulmonary disease, unspecified: Secondary | ICD-10-CM | POA: Diagnosis present

## 2014-08-12 DIAGNOSIS — I5033 Acute on chronic diastolic (congestive) heart failure: Secondary | ICD-10-CM

## 2014-08-12 DIAGNOSIS — Z923 Personal history of irradiation: Secondary | ICD-10-CM

## 2014-08-12 DIAGNOSIS — Z79899 Other long term (current) drug therapy: Secondary | ICD-10-CM

## 2014-08-12 DIAGNOSIS — R7989 Other specified abnormal findings of blood chemistry: Secondary | ICD-10-CM | POA: Diagnosis not present

## 2014-08-12 DIAGNOSIS — K8050 Calculus of bile duct without cholangitis or cholecystitis without obstruction: Secondary | ICD-10-CM

## 2014-08-12 DIAGNOSIS — J690 Pneumonitis due to inhalation of food and vomit: Secondary | ICD-10-CM | POA: Diagnosis not present

## 2014-08-12 DIAGNOSIS — E119 Type 2 diabetes mellitus without complications: Secondary | ICD-10-CM | POA: Diagnosis present

## 2014-08-12 DIAGNOSIS — J9620 Acute and chronic respiratory failure, unspecified whether with hypoxia or hypercapnia: Secondary | ICD-10-CM | POA: Diagnosis present

## 2014-08-12 DIAGNOSIS — D72829 Elevated white blood cell count, unspecified: Secondary | ICD-10-CM | POA: Diagnosis present

## 2014-08-12 DIAGNOSIS — I369 Nonrheumatic tricuspid valve disorder, unspecified: Secondary | ICD-10-CM | POA: Diagnosis not present

## 2014-08-12 DIAGNOSIS — R1115 Cyclical vomiting syndrome unrelated to migraine: Secondary | ICD-10-CM | POA: Diagnosis present

## 2014-08-12 DIAGNOSIS — J9621 Acute and chronic respiratory failure with hypoxia: Secondary | ICD-10-CM

## 2014-08-12 DIAGNOSIS — R932 Abnormal findings on diagnostic imaging of liver and biliary tract: Secondary | ICD-10-CM | POA: Diagnosis present

## 2014-08-12 DIAGNOSIS — I509 Heart failure, unspecified: Secondary | ICD-10-CM | POA: Diagnosis present

## 2014-08-12 DIAGNOSIS — K8060 Calculus of gallbladder and bile duct with cholecystitis, unspecified, without obstruction: Secondary | ICD-10-CM | POA: Diagnosis not present

## 2014-08-12 DIAGNOSIS — N2889 Other specified disorders of kidney and ureter: Secondary | ICD-10-CM

## 2014-08-12 DIAGNOSIS — C61 Malignant neoplasm of prostate: Secondary | ICD-10-CM

## 2014-08-12 DIAGNOSIS — E8779 Other fluid overload: Secondary | ICD-10-CM | POA: Diagnosis present

## 2014-08-12 DIAGNOSIS — K8064 Calculus of gallbladder and bile duct with chronic cholecystitis without obstruction: Principal | ICD-10-CM | POA: Diagnosis present

## 2014-08-12 DIAGNOSIS — J90 Pleural effusion, not elsewhere classified: Secondary | ICD-10-CM | POA: Diagnosis not present

## 2014-08-12 DIAGNOSIS — Z7982 Long term (current) use of aspirin: Secondary | ICD-10-CM | POA: Diagnosis not present

## 2014-08-12 DIAGNOSIS — T502X5A Adverse effect of carbonic-anhydrase inhibitors, benzothiadiazides and other diuretics, initial encounter: Secondary | ICD-10-CM | POA: Diagnosis present

## 2014-08-12 DIAGNOSIS — J441 Chronic obstructive pulmonary disease with (acute) exacerbation: Secondary | ICD-10-CM | POA: Diagnosis not present

## 2014-08-12 DIAGNOSIS — F528 Other sexual dysfunction not due to a substance or known physiological condition: Secondary | ICD-10-CM

## 2014-08-12 DIAGNOSIS — R109 Unspecified abdominal pain: Secondary | ICD-10-CM | POA: Diagnosis not present

## 2014-08-12 DIAGNOSIS — I10 Essential (primary) hypertension: Secondary | ICD-10-CM | POA: Diagnosis present

## 2014-08-12 DIAGNOSIS — Z87891 Personal history of nicotine dependence: Secondary | ICD-10-CM

## 2014-08-12 DIAGNOSIS — R7881 Bacteremia: Secondary | ICD-10-CM | POA: Diagnosis present

## 2014-08-12 DIAGNOSIS — E786 Lipoprotein deficiency: Secondary | ICD-10-CM | POA: Diagnosis not present

## 2014-08-12 DIAGNOSIS — E876 Hypokalemia: Secondary | ICD-10-CM | POA: Diagnosis not present

## 2014-08-12 DIAGNOSIS — N289 Disorder of kidney and ureter, unspecified: Secondary | ICD-10-CM

## 2014-08-12 DIAGNOSIS — B961 Klebsiella pneumoniae [K. pneumoniae] as the cause of diseases classified elsewhere: Secondary | ICD-10-CM | POA: Diagnosis present

## 2014-08-12 DIAGNOSIS — K859 Acute pancreatitis without necrosis or infection, unspecified: Secondary | ICD-10-CM | POA: Diagnosis present

## 2014-08-12 DIAGNOSIS — Z9849 Cataract extraction status, unspecified eye: Secondary | ICD-10-CM

## 2014-08-12 DIAGNOSIS — K8020 Calculus of gallbladder without cholecystitis without obstruction: Secondary | ICD-10-CM | POA: Diagnosis not present

## 2014-08-12 DIAGNOSIS — D638 Anemia in other chronic diseases classified elsewhere: Secondary | ICD-10-CM | POA: Diagnosis present

## 2014-08-12 DIAGNOSIS — J9600 Acute respiratory failure, unspecified whether with hypoxia or hypercapnia: Secondary | ICD-10-CM

## 2014-08-12 DIAGNOSIS — R945 Abnormal results of liver function studies: Secondary | ICD-10-CM

## 2014-08-12 DIAGNOSIS — Z794 Long term (current) use of insulin: Secondary | ICD-10-CM | POA: Diagnosis not present

## 2014-08-12 DIAGNOSIS — R0989 Other specified symptoms and signs involving the circulatory and respiratory systems: Secondary | ICD-10-CM | POA: Diagnosis not present

## 2014-08-12 DIAGNOSIS — K7589 Other specified inflammatory liver diseases: Secondary | ICD-10-CM

## 2014-08-12 DIAGNOSIS — J9601 Acute respiratory failure with hypoxia: Secondary | ICD-10-CM

## 2014-08-12 DIAGNOSIS — Z48815 Encounter for surgical aftercare following surgery on the digestive system: Secondary | ICD-10-CM | POA: Diagnosis not present

## 2014-08-12 DIAGNOSIS — Z01818 Encounter for other preprocedural examination: Secondary | ICD-10-CM | POA: Diagnosis not present

## 2014-08-12 DIAGNOSIS — Z8546 Personal history of malignant neoplasm of prostate: Secondary | ICD-10-CM

## 2014-08-12 DIAGNOSIS — J439 Emphysema, unspecified: Secondary | ICD-10-CM

## 2014-08-12 DIAGNOSIS — J984 Other disorders of lung: Secondary | ICD-10-CM | POA: Diagnosis not present

## 2014-08-12 DIAGNOSIS — R0602 Shortness of breath: Secondary | ICD-10-CM | POA: Diagnosis not present

## 2014-08-12 DIAGNOSIS — J189 Pneumonia, unspecified organism: Secondary | ICD-10-CM

## 2014-08-12 DIAGNOSIS — K461 Unspecified abdominal hernia with gangrene: Secondary | ICD-10-CM | POA: Diagnosis not present

## 2014-08-12 DIAGNOSIS — R112 Nausea with vomiting, unspecified: Secondary | ICD-10-CM | POA: Diagnosis not present

## 2014-08-12 DIAGNOSIS — R0902 Hypoxemia: Secondary | ICD-10-CM | POA: Diagnosis not present

## 2014-08-12 DIAGNOSIS — J811 Chronic pulmonary edema: Secondary | ICD-10-CM | POA: Diagnosis not present

## 2014-08-12 DIAGNOSIS — J9622 Acute and chronic respiratory failure with hypercapnia: Secondary | ICD-10-CM

## 2014-08-12 DIAGNOSIS — K8010 Calculus of gallbladder with chronic cholecystitis without obstruction: Secondary | ICD-10-CM | POA: Diagnosis not present

## 2014-08-12 DIAGNOSIS — E785 Hyperlipidemia, unspecified: Secondary | ICD-10-CM | POA: Diagnosis present

## 2014-08-12 DIAGNOSIS — K659 Peritonitis, unspecified: Secondary | ICD-10-CM | POA: Diagnosis not present

## 2014-08-12 DIAGNOSIS — I5032 Chronic diastolic (congestive) heart failure: Secondary | ICD-10-CM

## 2014-08-12 HISTORY — DX: Type 2 diabetes mellitus without complications: E11.9

## 2014-08-12 HISTORY — DX: Personal history of colon polyps, unspecified: Z86.0100

## 2014-08-12 HISTORY — DX: Essential (primary) hypertension: I10

## 2014-08-12 HISTORY — DX: Other specified disorders of kidney and ureter: N28.89

## 2014-08-12 HISTORY — DX: Personal history of colonic polyps: Z86.010

## 2014-08-12 LAB — COMPREHENSIVE METABOLIC PANEL
ALK PHOS: 187 U/L — AB (ref 39–117)
ALT: 90 U/L — ABNORMAL HIGH (ref 0–53)
AST: 65 U/L — AB (ref 0–37)
Albumin: 4 g/dL (ref 3.5–5.2)
Anion gap: 19 — ABNORMAL HIGH (ref 5–15)
BILIRUBIN TOTAL: 6.5 mg/dL — AB (ref 0.3–1.2)
BUN: 16 mg/dL (ref 6–23)
CHLORIDE: 93 meq/L — AB (ref 96–112)
CO2: 24 mEq/L (ref 19–32)
Calcium: 9.3 mg/dL (ref 8.4–10.5)
Creatinine, Ser: 0.91 mg/dL (ref 0.50–1.35)
GFR calc Af Amer: >90 mL/min (ref >90)
GFR calc non Af Amer: 78 mL/min — ABNORMAL LOW (ref >90)
Glucose, Bld: 200 mg/dL — ABNORMAL HIGH (ref 70–99)
POTASSIUM: 4 meq/L (ref 3.7–5.3)
SODIUM: 136 meq/L — AB (ref 137–147)
Total Protein: 7.8 g/dL (ref 6.0–8.3)

## 2014-08-12 LAB — STREP PNEUMONIAE URINARY ANTIGEN: STREP PNEUMO URINARY ANTIGEN: NEGATIVE

## 2014-08-12 LAB — HEMOGLOBIN A1C
Hgb A1c MFr Bld: 7 % — ABNORMAL HIGH (ref <5.7)
Mean Plasma Glucose: 154 mg/dL — ABNORMAL HIGH (ref <117)

## 2014-08-12 LAB — CBC WITH DIFFERENTIAL/PLATELET
BASOS ABS: 0 10*3/uL (ref 0.0–0.1)
BASOS PCT: 0 % (ref 0–1)
EOS ABS: 0 10*3/uL (ref 0.0–0.7)
Eosinophils Relative: 0 % (ref 0–5)
HCT: 41.1 % (ref 39.0–52.0)
Hemoglobin: 14 g/dL (ref 13.0–17.0)
Lymphocytes Relative: 6 % — ABNORMAL LOW (ref 12–46)
Lymphs Abs: 0.9 10*3/uL (ref 0.7–4.0)
MCH: 31.8 pg (ref 26.0–34.0)
MCHC: 34.1 g/dL (ref 30.0–36.0)
MCV: 93.4 fL (ref 78.0–100.0)
MONOS PCT: 5 % (ref 3–12)
Monocytes Absolute: 0.7 10*3/uL (ref 0.1–1.0)
NEUTROS ABS: 13.9 10*3/uL — AB (ref 1.7–7.7)
NEUTROS PCT: 89 % — AB (ref 43–77)
PLATELETS: 206 10*3/uL (ref 150–400)
RBC: 4.4 MIL/uL (ref 4.22–5.81)
RDW: 13.2 % (ref 11.5–15.5)
WBC: 15.6 10*3/uL — ABNORMAL HIGH (ref 4.0–10.5)

## 2014-08-12 LAB — GLUCOSE, CAPILLARY
Glucose-Capillary: 169 mg/dL — ABNORMAL HIGH (ref 70–99)
Glucose-Capillary: 205 mg/dL — ABNORMAL HIGH (ref 70–99)
Glucose-Capillary: 171 mg/dL — ABNORMAL HIGH (ref 70–99)

## 2014-08-12 LAB — PRO B NATRIURETIC PEPTIDE: PRO B NATRI PEPTIDE: 772.2 pg/mL — AB (ref 0–450)

## 2014-08-12 LAB — I-STAT TROPONIN, ED: Troponin i, poc: 0 ng/mL (ref 0.00–0.08)

## 2014-08-12 LAB — HIV ANTIBODY (ROUTINE TESTING W REFLEX): HIV 1&2 Ab, 4th Generation: NONREACTIVE

## 2014-08-12 LAB — LIPASE, BLOOD: Lipase: >3000 U/L — ABNORMAL HIGH (ref 11–59)

## 2014-08-12 MED ORDER — SODIUM CHLORIDE 0.9 % IV SOLN
INTRAVENOUS | Status: DC
  Administered 2014-08-12: 11:00:00 via INTRAVENOUS

## 2014-08-12 MED ORDER — ACETAMINOPHEN 650 MG RE SUPP: 650.0000 mg | Freq: Four times a day (QID) | RECTAL | Status: DC | PRN

## 2014-08-12 MED ORDER — SODIUM CHLORIDE 0.9 % IJ SOLN
3.0000 mL | Freq: Two times a day (BID) | INTRAMUSCULAR | Status: DC
  Administered 2014-08-18 – 2014-08-23 (×6): 3 mL via INTRAVENOUS

## 2014-08-12 MED ORDER — IPRATROPIUM-ALBUTEROL 0.5-2.5 (3) MG/3ML IN SOLN: 3.0000 mL | Freq: Four times a day (QID) | RESPIRATORY_TRACT | Status: DC | PRN

## 2014-08-12 MED ORDER — ACETAMINOPHEN 325 MG PO TABS
650.0000 mg | ORAL_TABLET | Freq: Four times a day (QID) | ORAL | Status: DC | PRN
  Administered 2014-08-12 – 2014-08-22 (×15): 650 mg via ORAL
  Filled 2014-08-12 (×16): qty 2

## 2014-08-12 MED ORDER — MORPHINE SULFATE 4 MG/ML IJ SOLN
4.0000 mg | Freq: Once | INTRAMUSCULAR | Status: AC
  Administered 2014-08-12: 4 mg via INTRAVENOUS
  Filled 2014-08-12: qty 1

## 2014-08-12 MED ORDER — GUAIFENESIN ER 600 MG PO TB12
600.0000 mg | ORAL_TABLET | Freq: Every day | ORAL | Status: DC
  Administered 2014-08-12 – 2014-08-23 (×10): 600 mg via ORAL
  Filled 2014-08-12 (×13): qty 1

## 2014-08-12 MED ORDER — IPRATROPIUM BROMIDE 0.02 % IN SOLN
0.5000 mg | RESPIRATORY_TRACT | Status: DC
  Administered 2014-08-12 (×3): 0.5 mg via RESPIRATORY_TRACT
  Filled 2014-08-12 (×3): qty 2.5

## 2014-08-12 MED ORDER — KCL IN DEXTROSE-NACL 20-5-0.9 MEQ/L-%-% IV SOLN
INTRAVENOUS | Status: DC
  Administered 2014-08-12: 15:00:00 via INTRAVENOUS
  Filled 2014-08-12 (×3): qty 1000

## 2014-08-12 MED ORDER — METFORMIN HCL 850 MG PO TABS
850.0000 mg | ORAL_TABLET | Freq: Two times a day (BID) | ORAL | Status: DC
  Filled 2014-08-12 (×3): qty 1

## 2014-08-12 MED ORDER — METFORMIN HCL 850 MG PO TABS
850.0000 mg | ORAL_TABLET | Freq: Two times a day (BID) | ORAL | Status: DC
  Filled 2014-08-12: qty 1

## 2014-08-12 MED ORDER — MORPHINE SULFATE 2 MG/ML IJ SOLN
0.5000 mg | INTRAMUSCULAR | Status: DC | PRN
  Administered 2014-08-12 – 2014-08-22 (×3): 0.5 mg via INTRAVENOUS
  Filled 2014-08-12 (×3): qty 1

## 2014-08-12 MED ORDER — FUROSEMIDE 10 MG/ML IJ SOLN: 20.0000 mg | Freq: Once | INTRAMUSCULAR | Status: DC

## 2014-08-12 MED ORDER — IPRATROPIUM-ALBUTEROL 0.5-2.5 (3) MG/3ML IN SOLN
3.0000 mL | Freq: Four times a day (QID) | RESPIRATORY_TRACT | Status: DC
  Administered 2014-08-13 – 2014-08-14 (×5): 3 mL via RESPIRATORY_TRACT
  Filled 2014-08-12 (×5): qty 3

## 2014-08-12 MED ORDER — INSULIN ASPART 100 UNIT/ML ~~LOC~~ SOLN
0.0000 [IU] | Freq: Three times a day (TID) | SUBCUTANEOUS | Status: DC
  Administered 2014-08-12: 2 [IU] via SUBCUTANEOUS
  Administered 2014-08-12: 3 [IU] via SUBCUTANEOUS
  Administered 2014-08-13 (×2): 1 [IU] via SUBCUTANEOUS
  Administered 2014-08-14: 2 [IU] via SUBCUTANEOUS
  Administered 2014-08-14: 1 [IU] via SUBCUTANEOUS
  Administered 2014-08-14: 2 [IU] via SUBCUTANEOUS
  Administered 2014-08-15 – 2014-08-17 (×5): 1 [IU] via SUBCUTANEOUS
  Administered 2014-08-17 (×2): 2 [IU] via SUBCUTANEOUS
  Administered 2014-08-18 – 2014-08-19 (×3): 1 [IU] via SUBCUTANEOUS
  Administered 2014-08-20: 2 [IU] via SUBCUTANEOUS
  Administered 2014-08-20 – 2014-08-21 (×4): 1 [IU] via SUBCUTANEOUS
  Administered 2014-08-21: 2 [IU] via SUBCUTANEOUS

## 2014-08-12 MED ORDER — LEVOFLOXACIN IN D5W 750 MG/150ML IV SOLN
750.0000 mg | INTRAVENOUS | Status: DC
  Administered 2014-08-12 – 2014-08-13 (×2): 750 mg via INTRAVENOUS
  Filled 2014-08-12 (×2): qty 150

## 2014-08-12 MED ORDER — ASPIRIN EC 81 MG PO TBEC
81.0000 mg | DELAYED_RELEASE_TABLET | Freq: Every day | ORAL | Status: DC
  Administered 2014-08-12 – 2014-08-21 (×9): 81 mg via ORAL
  Filled 2014-08-12 (×12): qty 1

## 2014-08-12 MED ORDER — ALBUTEROL SULFATE (2.5 MG/3ML) 0.083% IN NEBU
INHALATION_SOLUTION | RESPIRATORY_TRACT | Status: AC
  Administered 2014-08-12: 2.5 mg
  Filled 2014-08-12: qty 3

## 2014-08-12 MED ORDER — SODIUM CHLORIDE 0.9 % IV BOLUS (SEPSIS)
500.0000 mL | Freq: Once | INTRAVENOUS | Status: AC
  Administered 2014-08-12: 500 mL via INTRAVENOUS

## 2014-08-12 MED ORDER — ENOXAPARIN SODIUM 40 MG/0.4ML ~~LOC~~ SOLN
40.0000 mg | SUBCUTANEOUS | Status: DC
  Administered 2014-08-12 – 2014-08-15 (×4): 40 mg via SUBCUTANEOUS
  Filled 2014-08-12 (×4): qty 0.4

## 2014-08-12 MED ORDER — LOSARTAN POTASSIUM 50 MG PO TABS
100.0000 mg | ORAL_TABLET | Freq: Every day | ORAL | Status: DC
  Administered 2014-08-12: 100 mg via ORAL
  Filled 2014-08-12 (×2): qty 2

## 2014-08-12 MED ORDER — ALBUTEROL SULFATE (2.5 MG/3ML) 0.083% IN NEBU
2.5000 mg | INHALATION_SOLUTION | Freq: Four times a day (QID) | RESPIRATORY_TRACT | Status: DC | PRN
  Administered 2014-08-13: 2.5 mg via RESPIRATORY_TRACT
  Filled 2014-08-12: qty 3

## 2014-08-12 MED ORDER — IPRATROPIUM BROMIDE 0.02 % IN SOLN: 0.5000 mg | RESPIRATORY_TRACT | Status: DC | PRN

## 2014-08-12 MED ORDER — ATORVASTATIN CALCIUM 20 MG PO TABS
20.0000 mg | ORAL_TABLET | Freq: Every day | ORAL | Status: DC
  Administered 2014-08-12 – 2014-08-22 (×11): 20 mg via ORAL
  Filled 2014-08-12 (×5): qty 1
  Filled 2014-08-12: qty 2
  Filled 2014-08-12 (×7): qty 1

## 2014-08-12 MED ORDER — SODIUM CHLORIDE 0.9 % IV SOLN
INTRAVENOUS | Status: DC
  Administered 2014-08-12 – 2014-08-16 (×7): via INTRAVENOUS

## 2014-08-12 MED ORDER — ENOXAPARIN SODIUM 30 MG/0.3ML ~~LOC~~ SOLN: 30.0000 mg | SUBCUTANEOUS | Status: DC

## 2014-08-12 MED ORDER — IOHEXOL 300 MG/ML  SOLN
100.0000 mL | Freq: Once | INTRAMUSCULAR | Status: AC | PRN
  Administered 2014-08-12: 100 mL via INTRAVENOUS

## 2014-08-12 MED ORDER — FUROSEMIDE 10 MG/ML IJ SOLN: 20.0000 mg | Freq: Every day | INTRAMUSCULAR | Status: DC

## 2014-08-12 MED ORDER — ONDANSETRON HCL 4 MG PO TABS: 4.0000 mg | ORAL_TABLET | Freq: Four times a day (QID) | ORAL | Status: DC | PRN

## 2014-08-12 MED ORDER — IOHEXOL 300 MG/ML  SOLN
50.0000 mL | Freq: Once | INTRAMUSCULAR | Status: AC | PRN
  Administered 2014-08-12: 50 mL via ORAL

## 2014-08-12 MED ORDER — CARVEDILOL 25 MG PO TABS
25.0000 mg | ORAL_TABLET | Freq: Two times a day (BID) | ORAL | Status: DC
  Administered 2014-08-12 – 2014-08-23 (×23): 25 mg via ORAL
  Filled 2014-08-12: qty 2
  Filled 2014-08-12 (×16): qty 1
  Filled 2014-08-12: qty 2
  Filled 2014-08-12 (×5): qty 1
  Filled 2014-08-12: qty 2
  Filled 2014-08-12 (×4): qty 1

## 2014-08-12 MED ORDER — ONDANSETRON HCL 4 MG/2ML IJ SOLN: 4.0000 mg | Freq: Four times a day (QID) | INTRAMUSCULAR | Status: DC | PRN

## 2014-08-12 NOTE — Consult Note (Signed)
Consultation  Referring Provider:     Hospitalist Primary Care Physician:  [name], MD Primary Gastroenterologist:      [facility]   Reason for Consultation:     Pancreatitis - abnormal bile duct     Impression / Plan:   Acute pancreatitis likely biliary Abnormality in CBD on CT - I favor soft stone/sludgeball I very much doubt  Pneumonia - think dyspnea was from pain - would dc Abx soon  Supportive care for now - D5NS, needs aggressive hydration in pancreatitis - even with DM needs glucose MRCP vs EUS at some point most likely - probably MRCP IF HE CAN HOLD HIS BREATH WELL ERCP quite possible also         HPI:   Jim [name] is a 80 y.o. male admitted with acute onset of epigastric abdominal pain and vomiting yesterday. Studies show findings c/w pancreatitis. He had dyspnea also. Better now. No prior hx same.  Past Medical History  Diagnosis Date  . DM (diabetes mellitus) 10/2009  . COPD (chronic obstructive pulmonary disease)   . Abnormal chest xray     last XR 11-2010, no further  f/u CXRs  . Syncope 2010    saw cards   . Hyperlipidemia   . Hypertension   . Prostate cancer 2008    s/p XRT  . ED (erectile dysfunction)   . 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  . Personal history of colonic polyps     none in 2010    Past Surgical History  Procedure Laterality Date  . Cataract extraction      bilaterally  . Eye surgery Left 07/17/2014  . Colonoscopy      Family History  Problem Relation Age of Onset  . Cirrhosis Brother     liver  . Dementia Mother   . Colon cancer Neg Hx   . Prostate cancer Neg Hx   . CAD Neg Hx     History  Substance Use Topics  . Smoking status: Former Smoker -- 2.00 packs/day for 40 years    Types: Cigarettes    Quit date: 11/24/2005  . Smokeless tobacco: Never Used  . Alcohol Use: 0.0 oz/week     Comment: rarely    Prior to Admission medications   Medication Sig Start Date End Date Taking? Authorizing  Provider  albuterol (PROVENTIL) (2.5 MG/3ML) 0.083% nebulizer solution Take 3 mLs (2.5 mg total) by nebulization 4 (four) times daily as needed. 10/26/13  Yes Colon Branch, MD  aspirin 81 MG tablet Take 81 mg by mouth daily.     Yes Historical Provider, MD  atorvastatin (LIPITOR) 20 MG tablet take 1 tablet by mouth once daily 06/21/14  Yes Colon Branch, MD  carvedilol (COREG) 25 MG tablet take 1 tablet by mouth twice a day with meals 06/21/14  Yes Colon Branch, MD  furosemide (LASIX) 20 MG tablet Take 20 mg by mouth daily.   Yes Historical Provider, MD  guaiFENesin (MUCINEX) 600 MG 12 hr tablet Take 600 mg by mouth daily.     Yes Historical Provider, MD  Ipratropium-Albuterol (COMBIVENT RESPIMAT) 20-100 MCG/ACT AERS respimat Inhale 1 puff into the lungs every 6 (six) hours as needed for wheezing.   Yes Historical Provider, MD  losartan (COZAAR) 100 MG tablet take 1 tablet by mouth once daily 06/21/14  Yes Colon Branch, MD  metFORMIN (GLUCOPHAGE) 500 MG tablet take 1 tablet by mouth twice a  day with meals 07/19/14  Yes Colon Branch, MD  Multiple Vitamins-Minerals (PRESERVISION AREDS PO) Take 2 tablets by mouth daily.     Yes Historical Provider, MD  SYMBICORT 160-4.5 MCG/ACT inhaler inhale 2 puffs by mouth twice a day   Yes Kathee Delton, MD    Current Facility-Administered Medications  Medication Dose Route Frequency Provider Last Rate Last Dose  . 0.9 %  sodium chloride infusion   Intravenous Continuous Robbie Lis, MD 50 mL/hr at 08/12/14 1107    . acetaminophen (TYLENOL) tablet 650 mg  650 mg Oral Q6H PRN Robbie Lis, MD       Or  . acetaminophen (TYLENOL) suppository 650 mg  650 mg Rectal Q6H PRN Robbie Lis, MD      . aspirin EC tablet 81 mg  81 mg Oral Daily Robbie Lis, MD   81 mg at 08/12/14 1107  . atorvastatin (LIPITOR) tablet 20 mg  20 mg Oral q1800 Robbie Lis, MD      . carvedilol (COREG) tablet 25 mg  25 mg Oral BID WC Robbie Lis, MD   25 mg at 08/12/14 1107  . enoxaparin  (LOVENOX) injection 40 mg  40 mg Subcutaneous Q24H Audrea Muscat [place], [place]   40 mg at 08/12/14 1107  . guaiFENesin (MUCINEX) 12 hr tablet 600 mg  600 mg Oral Daily Robbie Lis, MD   600 mg at 08/12/14 1106  . insulin aspart (novoLOG) injection 0-9 Units  0-9 Units Subcutaneous TID WC Robbie Lis, MD      . ipratropium (ATROVENT) nebulizer solution 0.5 mg  0.5 mg Nebulization Q2H PRN Robbie Lis, MD      . ipratropium (ATROVENT) nebulizer solution 0.5 mg  0.5 mg Nebulization Q4H Robbie Lis, MD      . levofloxacin (LEVAQUIN) IVPB 750 mg  750 mg Intravenous Q24H Robbie Lis, MD   750 mg at 08/12/14 1111  . losartan (COZAAR) tablet 100 mg  100 mg Oral Daily Robbie Lis, MD   100 mg at 08/12/14 1106  . [START ON 08/14/2014] metFORMIN (GLUCOPHAGE) tablet 850 mg  850 mg Oral BID WC Robbie Lis, MD      . morphine 2 MG/ML injection 0.5 mg  0.5 mg Intravenous Q4H PRN Robbie Lis, MD      . ondansetron [facility]) tablet 4 mg  4 mg Oral Q6H PRN Robbie Lis, MD       Or  . ondansetron [facility]) injection 4 mg  4 mg Intravenous Q6H PRN Robbie Lis, MD      . sodium chloride 0.9 % injection 3 mL  3 mL Intravenous Q12H Robbie Lis, MD        Allergies as of 08/12/2014 - Review Complete 08/12/2014  Allergen Reaction Noted  . Amlodipine besylate  10/12/2007  . Cardura [doxazosin mesylate] Itching 11/02/2013  . Clonidine derivatives Itching 11/02/2013  . Fluticasone-salmeterol Hives and Itching      Review of Systems:    This is positive for those things mentioned in the HPI, also positive for weakness, chronic dyspnea All other review of systems are negative.     Physical Exam:  Vital signs in last 24 hours: Temp:  [98.4 F (36.9 C)-98.5 F (36.9 C)] 98.5 F (36.9 C) (09/19 0828) Pulse Rate:  [70-84] 84 (09/19 0828) Resp:  [15-25] 20 (09/19 0828) BP: (150-193)/(76-122) 166/79 mmHg (09/19 0828) SpO2:  [88 %-99 %]  97 % (09/19 0828) Weight:  [175 lb (79.379 kg)] 175 lb (79.379 kg)  (09/19 0421)    General:  Well-developed, well-nourished and in no acute distress - elderly Eyes:  anicteric. ENT:   Mouth and posterior pharynx free of lesions.  Neck:   supple w/o thyromegaly or mass.  Lungs: Diffusely decreased Heart:  S1S2, no rubs, murmurs, gallops. Abdomen:  soft, non-tender, no hepatosplenomegaly, hernia, or mass and BS+.  Lymph:  no cervical or supraclavicular adenopathy. Extremities:   no edema Skin   no rash. Neuro:  A&O x 3.  Psych:  appropriate mood and  Affect.   Data Reviewed:   LAB RESULTS:  Recent Labs  08/12/14 0431  WBC 15.6*  HGB 14.0  HCT 41.1  PLT 206   BMET  Recent Labs  08/12/14 0431  NA 136*  K 4.0  CL 93*  CO2 24  GLUCOSE 200*  BUN 16  CREATININE 0.91  CALCIUM 9.3   LFT  Recent Labs  08/12/14 0431  PROT 7.8  ALBUMIN 4.0  AST 65*  ALT 90*  ALKPHOS 187*  BILITOT 6.5*   PT/INR No results found for this basename: LABPROT, INR,  in the last 72 hours  STUDIES: images viewed Dg Chest 2 View  08/12/2014   CLINICAL DATA:  Severe shortness of breath. Dyspnea. Abdominal pain and bloating.  EXAM: CHEST  2 VIEW  COMPARISON:  Chest radiograph performed 12/23/2010  FINDINGS: The lungs are well-aerated. Right mid lung scarring is noted. Minimal left basilar opacity may reflect atelectasis or possibly mild pneumonia, given the patient's symptoms. Mild vascular congestion is noted. There is no evidence of pleural effusion or pneumothorax.  The heart is normal in size; the mediastinal contour is within normal limits. No acute osseous abnormalities are seen.  IMPRESSION: Right mid lung scarring again noted; minimal left basilar airspace opacity may reflect atelectasis or possibly mild pneumonia, given the patient's symptoms. Mild vascular congestion noted.   Electronically Signed   By: Garald Balding M.D.   On: 08/12/2014 05:22   Ct Abdomen Pelvis W Contrast  08/12/2014   CLINICAL DATA:  Severe shortness of breath and severe  abdominal pain.  EXAM: CT ABDOMEN AND PELVIS WITH CONTRAST  TECHNIQUE: Multidetector CT imaging of the abdomen and pelvis was performed using the standard protocol following bolus administration of intravenous contrast.  CONTRAST:  87mL OMNIPAQUE IOHEXOL 300 MG/ML SOLN, 151mL OMNIPAQUE IOHEXOL 300 MG/ML SOLN  COMPARISON:  Abdominal ultrasound November 10, 2013  FINDINGS: LUNG BASES: Limited view of the lung bases demonstrates severe centrilobular emphysema. Dependent atelectasis. Heart size is normal. Coronary artery calcifications. Included pericardium is unremarkable.  SOLID ORGANS: The liver, spleen, gallbladder, and adrenal glands are unremarkable. Focal midline retroperitoneal inflammation, contiguous with the inferior margin of the pancreas and, ligaments of Treitz.  GASTROINTESTINAL TRACT: Small hiatal hernia, with under amount of surrounding mildly inflamed fat. The stomach, small and large bowel are normal in course and caliber without inflammatory changes. Suspected proximal small bowel wall thickening. Mild colonic diverticulosis. Normal appendix.  KIDNEYS/ URINARY TRACT: Kidneys are orthotopic, demonstrating symmetric enhancement. Solid enhancing LEFT lower pole 16 mm mass with mild washout on delayed phase. No nephrolithiasis, hydronephrosis. 10 mm RIGHT lower pole cysts. Too small to characterize RIGHT interpolar hypodensity. The unopacified ureters are normal in course and caliber. Delayed imaging through the kidneys demonstrates symmetric prompt contrast excretion within the proximal urinary collecting system. Urinary bladder is partially distended and unremarkable.  PERITONEUM/RETROPERITONEUM: The Common bile duct  is not enlarged, however there is a 5 mm soft tissue lesion along the posterior wall of the distal Common bile duct, 51/101 and 50/119. No intraperitoneal free fluid nor free air. Aortoiliac vessels are normal in course and caliber, moderate calcific atherosclerosis. No lymphadenopathy by  CT size criteria. Prostate brachytherapy seeds. Partially imaged small LEFT hydrocele.  SOFT TISSUE/OSSEOUS STRUCTURES: Nonsuspicious. Grade 1 L4-5 anterolisthesis on spondylotic basis.  IMPRESSION: Focal midline retroperitoneal inflammation, unclear if this is reflects sequelae duodenitis or possible pancreatitis. Suspected mild proximal small bowel wall thickening.  Nonobstructing subcentimeter distal Common bile duct lesion, which could reflect polyp or less likely neoplasm, possible sludge ball. Recommend MRCP on a nonemergent basis.  Small hiatal hernia with moderate amount of associated mildly inflamed fat, which is possibly tracking from the retroperitoneum.  16 mm solid LEFT lower pole renal mass highly concerning for renal cell carcinoma. Recommend further characterization with MRI, biopsy or surgery. This recommendation follows ACR consensus guidelines: Managing Incidental Findings on Abdominal CT: White Paper of the ACR Incidental Findings Committee. [facility] [phone]   Electronically Signed   By: Elon Alas   On: 08/12/2014 06:58     PREVIOUS ENDOSCOPIES:            Colonoscopy 2010 hx polyps none then ([facility])   Thanks   LOS: 0 days   @Carl  Simonne Maffucci, MD, [facility] @  08/12/2014, 11:14 AM

## 2014-08-12 NOTE — ED Provider Notes (Signed)
CSN: [id number]     Arrival date & time 08/12/14  0350 History   First MD Initiated Contact with Patient 08/12/14 0500     No chief complaint on file.    (Consider location/radiation/quality/duration/timing/severity/associated sxs/prior Treatment) HPI Patient presents with 24 hours of abdominal pain. He states the pain is in the upper part of the stomach. He also has had multiple episodes of vomiting. States he is unable to tolerate solids or liquids. He has not had a bowel movement for 24 hours. He also is unable to pass gas. He's had increasing shortness of breath but denies any chest pain. No cough, fever or chills. No lower extremity swelling or pain. Past Medical History  Diagnosis Date  . DM (diabetes mellitus) 10/2009  . COPD (chronic obstructive pulmonary disease)   . Abnormal chest xray     last XR 11-2010, no further  f/u CXRs  . Syncope 2010    saw cards   . Hyperlipidemia   . Hypertension   . Prostate cancer 2008    s/p XRT  . ED (erectile dysfunction)   . 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  . Personal history of colonic polyps     none in 2010   Past Surgical History  Procedure Laterality Date  . Cataract extraction      bilaterally  . Eye surgery Left 07/17/2014  . Colonoscopy     Family History  Problem Relation Age of Onset  . Cirrhosis Brother     liver  . Dementia Mother   . Colon cancer Neg Hx   . Prostate cancer Neg Hx   . CAD Neg Hx    History  Substance Use Topics  . Smoking status: Former Smoker -- 2.00 packs/day for 40 years    Types: Cigarettes    Quit date: 11/24/2005  . Smokeless tobacco: Never Used  . Alcohol Use: 0.0 oz/week     Comment: rarely    Review of Systems  Constitutional: Negative for fever and chills.  Respiratory: Positive for shortness of breath. Negative for cough and wheezing.   Cardiovascular: Negative for chest pain, palpitations and leg swelling.  Gastrointestinal: Positive for nausea, vomiting,  abdominal pain and constipation. Negative for diarrhea and blood in stool.  Genitourinary: Negative for dysuria and flank pain.  Musculoskeletal: Negative for back pain, myalgias, neck pain and neck stiffness.  Skin: Negative for rash and wound.  Neurological: Negative for dizziness, weakness, light-headedness, numbness and headaches.  All other systems reviewed and are negative.     Allergies  Amlodipine besylate; Cardura; Clonidine derivatives; and Fluticasone-salmeterol  Home Medications   Prior to Admission medications   Medication Sig Start Date End Date Taking? Authorizing Provider  albuterol (PROVENTIL) (2.5 MG/3ML) 0.083% nebulizer solution Take 3 mLs (2.5 mg total) by nebulization 4 (four) times daily as needed. 10/26/13  Yes Colon Branch, MD  aspirin 81 MG tablet Take 81 mg by mouth daily.     Yes Historical Provider, MD  atorvastatin (LIPITOR) 20 MG tablet take 1 tablet by mouth once daily 06/21/14  Yes Colon Branch, MD  carvedilol (COREG) 25 MG tablet take 1 tablet by mouth twice a day with meals 06/21/14  Yes Colon Branch, MD  furosemide (LASIX) 20 MG tablet Take 20 mg by mouth daily.   Yes Historical Provider, MD  guaiFENesin (MUCINEX) 600 MG 12 hr tablet Take 600 mg by mouth daily.     Yes Historical Provider, MD  Ipratropium-Albuterol (COMBIVENT RESPIMAT) 20-100 MCG/ACT AERS respimat Inhale 1 puff into the lungs every 6 (six) hours as needed for wheezing.   Yes Historical Provider, MD  losartan (COZAAR) 100 MG tablet take 1 tablet by mouth once daily 06/21/14  Yes Colon Branch, MD  metFORMIN (GLUCOPHAGE) 500 MG tablet take 1 tablet by mouth twice a day with meals 07/19/14  Yes Colon Branch, MD  Multiple Vitamins-Minerals (PRESERVISION AREDS PO) Take 2 tablets by mouth daily.     Yes Historical Provider, MD  SYMBICORT 160-4.5 MCG/ACT inhaler inhale 2 puffs by mouth twice a day   Yes Kathee Delton, MD   BP 106/57  Pulse 75  Temp(Src) 99.2 F (37.3 C) (Oral)  Resp 20  Ht 5\' 10"   (1.778 m)  Wt 175 lb 11.2 oz (79.697 kg)  BMI 25.21 kg/m2  SpO2 93% Physical Exam  Nursing note and vitals reviewed. Constitutional: He is oriented to person, place, and time. He appears well-developed and well-nourished. He appears distressed.  Uncomfortable appearing  HENT:  Head: Normocephalic and atraumatic.  Mouth/Throat: Oropharynx is clear and moist.  Eyes: EOM are normal. Pupils are equal, round, and reactive to light.  Neck: Normal range of motion. Neck supple.  Cardiovascular: Normal rate and regular rhythm.   Pulmonary/Chest: Effort normal. No respiratory distress. He has no wheezes. He has rales. Tenderness: scattered rales in bilateral bases.  Abdominal: Soft. Bowel sounds are normal. He exhibits distension. He exhibits no mass. There is tenderness (diffuse tenderness to palpation especially in the upper abdomen.). There is no rebound and no guarding.  Musculoskeletal: Normal range of motion. He exhibits no edema and no tenderness.  Neurological: He is alert and oriented to person, place, and time.  Moves all extremities without deficit. Sensation is grossly intact.  Skin: Skin is warm and dry. No rash noted. No erythema.  Psychiatric: He has a normal mood and affect. His behavior is normal.    ED Course  Procedures (including critical care time) Labs Review Labs Reviewed  CBC WITH DIFFERENTIAL - Abnormal; Notable for the following:    WBC 15.6 (*)    Neutrophils Relative % 89 (*)    Neutro Abs 13.9 (*)    Lymphocytes Relative 6 (*)    All other components within normal limits  COMPREHENSIVE METABOLIC PANEL - Abnormal; Notable for the following:    Sodium 136 (*)    Chloride 93 (*)    Glucose, Bld 200 (*)    AST 65 (*)    ALT 90 (*)    Alkaline Phosphatase 187 (*)    Total Bilirubin 6.5 (*)    GFR calc non Af Amer 78 (*)    Anion gap 19 (*)    All other components within normal limits  LIPASE, BLOOD - Abnormal; Notable for the following:    Lipase >3000 (*)     All other components within normal limits  PRO B NATRIURETIC PEPTIDE - Abnormal; Notable for the following:    Pro B Natriuretic peptide (BNP) 772.2 (*)    All other components within normal limits  HEMOGLOBIN A1C - Abnormal; Notable for the following:    Hemoglobin A1C 7.0 (*)    Mean Plasma Glucose 154 (*)    All other components within normal limits  GLUCOSE, CAPILLARY - Abnormal; Notable for the following:    Glucose-Capillary 169 (*)    All other components within normal limits  GLUCOSE, CAPILLARY - Abnormal; Notable for the following:  Glucose-Capillary 205 (*)    All other components within normal limits  GLUCOSE, CAPILLARY - Abnormal; Notable for the following:    Glucose-Capillary 171 (*)    All other components within normal limits  CULTURE, EXPECTORATED SPUTUM-ASSESSMENT  CULTURE, BLOOD (ROUTINE X 2)  CULTURE, BLOOD (ROUTINE X 2)  GRAM STAIN  HIV ANTIBODY (ROUTINE TESTING)  STREP PNEUMONIAE URINARY ANTIGEN  LEGIONELLA ANTIGEN, URINE  COMPREHENSIVE METABOLIC PANEL  CBC  LIPASE, BLOOD  I-STAT TROPOININ, ED    Imaging Review Dg Chest 2 View  08/12/2014   CLINICAL DATA:  Severe shortness of breath. Dyspnea. Abdominal pain and bloating.  EXAM: CHEST  2 VIEW  COMPARISON:  Chest radiograph performed 12/23/2010  FINDINGS: The lungs are well-aerated. Right mid lung scarring is noted. Minimal left basilar opacity may reflect atelectasis or possibly mild pneumonia, given the patient's symptoms. Mild vascular congestion is noted. There is no evidence of pleural effusion or pneumothorax.  The heart is normal in size; the mediastinal contour is within normal limits. No acute osseous abnormalities are seen.  IMPRESSION: Right mid lung scarring again noted; minimal left basilar airspace opacity may reflect atelectasis or possibly mild pneumonia, given the patient's symptoms. Mild vascular congestion noted.   Electronically Signed   By: Garald Balding M.D.   On: 08/12/2014 05:22   Ct  Abdomen Pelvis W Contrast  08/12/2014   CLINICAL DATA:  Severe shortness of breath and severe abdominal pain.  EXAM: CT ABDOMEN AND PELVIS WITH CONTRAST  TECHNIQUE: Multidetector CT imaging of the abdomen and pelvis was performed using the standard protocol following bolus administration of intravenous contrast.  CONTRAST:  91mL OMNIPAQUE IOHEXOL 300 MG/ML SOLN, 133mL OMNIPAQUE IOHEXOL 300 MG/ML SOLN  COMPARISON:  Abdominal ultrasound November 10, 2013  FINDINGS: LUNG BASES: Limited view of the lung bases demonstrates severe centrilobular emphysema. Dependent atelectasis. Heart size is normal. Coronary artery calcifications. Included pericardium is unremarkable.  SOLID ORGANS: The liver, spleen, gallbladder, and adrenal glands are unremarkable. Focal midline retroperitoneal inflammation, contiguous with the inferior margin of the pancreas and, ligaments of Treitz.  GASTROINTESTINAL TRACT: Small hiatal hernia, with under amount of surrounding mildly inflamed fat. The stomach, small and large bowel are normal in course and caliber without inflammatory changes. Suspected proximal small bowel wall thickening. Mild colonic diverticulosis. Normal appendix.  KIDNEYS/ URINARY TRACT: Kidneys are orthotopic, demonstrating symmetric enhancement. Solid enhancing LEFT lower pole 16 mm mass with mild washout on delayed phase. No nephrolithiasis, hydronephrosis. 10 mm RIGHT lower pole cysts. Too small to characterize RIGHT interpolar hypodensity. The unopacified ureters are normal in course and caliber. Delayed imaging through the kidneys demonstrates symmetric prompt contrast excretion within the proximal urinary collecting system. Urinary bladder is partially distended and unremarkable.  PERITONEUM/RETROPERITONEUM: The Common bile duct is not enlarged, however there is a 5 mm soft tissue lesion along the posterior wall of the distal Common bile duct, 51/101 and 50/119. No intraperitoneal free fluid nor free air. Aortoiliac  vessels are normal in course and caliber, moderate calcific atherosclerosis. No lymphadenopathy by CT size criteria. Prostate brachytherapy seeds. Partially imaged small LEFT hydrocele.  SOFT TISSUE/OSSEOUS STRUCTURES: Nonsuspicious. Grade 1 L4-5 anterolisthesis on spondylotic basis.  IMPRESSION: Focal midline retroperitoneal inflammation, unclear if this is reflects sequelae duodenitis or possible pancreatitis. Suspected mild proximal small bowel wall thickening.  Nonobstructing subcentimeter distal Common bile duct lesion, which could reflect polyp or less likely neoplasm, possible sludge ball. Recommend MRCP on a nonemergent basis.  Small hiatal hernia with moderate amount of  associated mildly inflamed fat, which is possibly tracking from the retroperitoneum.  16 mm solid LEFT lower pole renal mass highly concerning for renal cell carcinoma. Recommend further characterization with MRI, biopsy or surgery. This recommendation follows ACR consensus guidelines: Managing Incidental Findings on Abdominal CT: White Paper of the ACR Incidental Findings Committee. [facility]iol [phone]   Electronically Signed   By: Elon Alas   On: 08/12/2014 06:58     EKG Interpretation None      MDM   Final diagnoses:  Acute pancreatitis, unspecified pancreatitis type  Dyspnea  Renal mass    Patient states feeling much better after pain medication. Discussed with [facility] hospitalist and we'll admit patient for pancreatitis.    Julianne Rice, MD 08/13/14 0630

## 2014-08-12 NOTE — H&P (Addendum)
[facility] History and Physical  SENG LARCH HUD:[id number] DOB: [birth year] DOA: 08/12/2014  Referring physician: ER physician PCP: [name], MD   Chief Complaint: Abdominal pain, shortness of breath  HPI:  80 year old male with past medical history of hypertension, dyslipidemia, diabetes, chronic diastolic CHF, history of prostate cancer who presented to [facility] ED 08/12/2014 with worsening abdominal pain over past 24 hours prior to this admission. Patient explains pain is in the upper and mid abdomen associated with multiple episodes of nonbloody vomiting. Patient reports no previous events like this. Patient rates abdomen 5/10 in intensity. The pain is better with analgesia given in ED. In addition, patient reports worsening shortness of breath over past few days prior to this admission. Patient reports dry cough. No fevers or chills. No palpitations. No complaints of chest pain. No leg swelling. No lightheadedness or loss of consciousness. In ED, vitals are as follows: Blood pressure 157/78, heart rate 70, T max 98.4 F, oxygen saturation 88% on room air but has improved to 99% with 4 L nasal cannula oxygen support. Blood work revealed leukocytosis of 15.6, lipase more than 3000, AST 65, ALT 90, ALT 187, total bilirubin 6.5. BNP was 772. Imaging studies included chest x-ray which showed right mid lung scarring with minimal left basilar airspace opacity possibly reflecting mild pneumonia. CT abdomen showed focal midline retroperitoneal inflammation unclear if this is sequela of duodenitis or possible pancreatitis, possible mid to proximal small bowel wall thickening, non-obstructing sub-centimeters distal common bile duct lesion which could reflect polyp or less likely neoplasm, 16 mm solid left lower pole renal mass highly concerning for renal cell carcinoma. MRCP suggested for further evaluation. Patient was admitted for further evaluation and management to telemetry unit.  Assessment &  Plan    Principal Problem:   Acute respiratory failure with hypoxia /CAP (community acquired pneumonia) / COPD (chronic obstructive pulmonary disease) / Leukocytosis  Oxygen saturation is now better, 99% with [place] oxygen support.   Use atrovent for nebulizer treatment every 2 hours PRN and every 4 hours scheduled.  Pneumonia order set in place. FOllow up blood culture, resp culture results.  Started Levaquin for possible pneumonia.   COPD gold alert order placed.  Active Problems:  Type II or unspecified type diabetes mellitus without mention of complication, not stated as uncontrolled  Check A1c  Hold metformin, use SSI for now sensitive scale.   HYPERLIPIDEMIA  Continue statin therapy    HYPERTENSION  Resume losartan and coreg for now   Chronic diastolic CHF  BNP elevated at 772 on this admission. Pt is normally on lasix at home but will hold it since pt is NPO so will give fluid with caution, low rate 50 cc/hr  Resume losartan, coreg  Resume aspirin   Acute pancreatitis  Lipase level on admission more than 3000. No obvious stone on CT abdomen but what was seen is nonobstructing subcentimeter distal common bile duct lesion, which could reflect polyp or less likely neoplasm, possible sludge ball.  Gi consulted for further input on management.   Keep NPO except for meds   Supportive  care with analgesia and antiemetics    Abnormal LFTs  Likely due to pancreatitis  Trend LFT's  Appreciate GI consult and recommendations    Renal mass, left   Oncology consulted  DVT prophylaxis:   SCD's bilaterally   Radiological Exams on Admission:  Dg Chest 2 View 08/12/2014   Right mid lung scarring again noted; minimal left basilar airspace opacity may reflect  atelectasis or possibly mild pneumonia, given the patient's symptoms. Mild vascular congestion noted.     Ct Abdomen Pelvis W Contrast  08/12/2014  Focal midline retroperitoneal inflammation, unclear if this is  reflects sequelae duodenitis or possible pancreatitis. Suspected mild proximal small bowel wall thickening.  Nonobstructing subcentimeter distal Common bile duct lesion, which could reflect polyp or less likely neoplasm, possible sludge ball. Recommend MRCP on a nonemergent basis.  Small hiatal hernia with moderate amount of associated mildly inflamed fat, which is possibly tracking from the retroperitoneum.  16 mm solid LEFT lower pole renal mass highly concerning for renal cell carcinoma. Recommend further characterization with MRI, biopsy or surgery. This recommendation follows ACR consensus guidelines: Managing Incidental Findings on Abdominal CT: White Paper of the ACR Incidental Findings Committee. [facility] [phone]   Electronically Signed   By: Elon Alas   On: 08/12/2014 06:58    EKG: sinus rhythm   Code Status: Full Family Communication: Plan of care discussed with the patient  Disposition Plan: Admit for further evaluation; telemetry unit   Leisa Lenz, MD  [facility] Pager [phone]  Review of Systems:  Constitutional: Negative for fever, chills and malaise/fatigue. Negative for diaphoresis.  HENT: Negative for hearing loss, ear pain, nosebleeds, congestion, sore throat, neck pain, tinnitus and ear discharge.   Eyes: Negative for blurred vision, double vision, photophobia, pain, discharge and redness.  Respiratory: Negative for cough, hemoptysis, sputum production, positive for shortness of breath.   Cardiovascular: Negative for chest pain, palpitations, orthopnea, claudication and leg swelling.  Gastrointestinal: per HPI Genitourinary: Negative for dysuria, urgency, frequency, hematuria and flank pain.  Musculoskeletal: Negative for myalgias, back pain, joint pain and falls.  Skin: Negative for itching and rash.  Neurological: Negative for dizziness and weakness. Negative for tingling, tremors, sensory change, speech change, focal weakness, loss of  consciousness and headaches.  Endo/Heme/Allergies: Negative for environmental allergies and polydipsia. Does not bruise/bleed easily.  Psychiatric/Behavioral: Negative for suicidal ideas. The patient is not nervous/anxious.      Past Medical History  Diagnosis Date  . DM (diabetes mellitus) 10/2009  . COPD (chronic obstructive pulmonary disease)   . Abnormal chest xray     last XR 11-2010, no further  f/u CXRs  . Syncope 2010    saw cards   . Hyperlipidemia   . Hypertension   . Prostate cancer 2008    s/p XRT  . ED (erectile dysfunction)   . 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   Past Surgical History  Procedure Laterality Date  . Cataract extraction      bilaterally  . Eye surgery Left 07/17/2014   Social History:  reports that he quit smoking about 8 years ago. His smoking use included Cigarettes. He has a 80 pack-year smoking history. He has never used smokeless tobacco. He reports that he drinks alcohol. He reports that he does not use illicit drugs.  Allergies  Allergen Reactions  . Amlodipine Besylate     REACTION: swelling  . Cardura [Doxazosin Mesylate] Itching  . Clonidine Derivatives Itching  . Fluticasone-Salmeterol Hives and Itching    Family History:  Family History  Problem Relation Age of Onset  . Cirrhosis Brother     liver  . Dementia Mother   . Colon cancer Neg Hx   . Prostate cancer Neg Hx   . CAD Neg Hx      Prior to Admission medications   Medication Sig Start Date End Date  Taking? Authorizing Provider  albuterol (PROVENTIL) (2.5 MG/3ML) 0.083% nebulizer solution Take 3 mLs (2.5 mg total) by nebulization 4 (four) times daily as needed. 10/26/13  Yes Colon Branch, MD  aspirin 81 MG tablet Take 81 mg by mouth daily.     Yes Historical Provider, MD  atorvastatin (LIPITOR) 20 MG tablet take 1 tablet by mouth once daily 06/21/14  Yes Colon Branch, MD  carvedilol (COREG) 25 MG tablet take 1 tablet by mouth twice a day with meals 06/21/14  Yes  Colon Branch, MD  furosemide (LASIX) 20 MG tablet Take 20 mg by mouth daily.   Yes Historical Provider, MD  guaiFENesin (MUCINEX) 600 MG 12 hr tablet Take 600 mg by mouth daily.     Yes Historical Provider, MD  Ipratropium-Albuterol (COMBIVENT RESPIMAT) 20-100 MCG/ACT AERS respimat Inhale 1 puff into the lungs every 6 (six) hours as needed for wheezing.   Yes Historical Provider, MD  losartan (COZAAR) 100 MG tablet take 1 tablet by mouth once daily 06/21/14  Yes Colon Branch, MD  metFORMIN (GLUCOPHAGE) 500 MG tablet take 1 tablet by mouth twice a day with meals 07/19/14  Yes Colon Branch, MD  Multiple Vitamins-Minerals (PRESERVISION AREDS PO) Take 2 tablets by mouth daily.     Yes Historical Provider, MD  SYMBICORT 160-4.5 MCG/ACT inhaler inhale 2 puffs by mouth twice a day   Yes Kathee Delton, MD   Physical Exam: Filed Vitals:   08/12/14 0730 08/12/14 0745 08/12/14 0800 08/12/14 0828  BP: 153/78 160/89 150/76 166/79  Pulse: 81 74 72 84  Temp:    98.5 F (36.9 C)  TempSrc:    Oral  Resp: 20 18 19 20   Height:    5\' 10"  (1.778 m)  Weight:      SpO2: 96% 98% 98% 97%    Physical Exam  Constitutional: Appears in mild distress with shortness of breath HENT: Normocephalic. No tonsillar erythema or exudates Eyes: Conjunctivae are normal. PERRLA, no scleral icterus.  Neck: Normal ROM. Neck supple. No JVD. No tracheal deviation. No thyromegaly.  CVS: RRR, S1/S2 appreciated   Pulmonary: Diminished breath sounds, mild wheezing Abdominal: Soft. BS +,  no distension, tenderness in mid abdomen, no rebound or guarding.  Musculoskeletal: Normal range of motion. No edema and no tenderness.  Lymphadenopathy: No lymphadenopathy noted, cervical, inguinal. Neuro: Alert. Normal reflexes, muscle tone coordination. No focal neurologic deficits. Skin: Skin is warm and dry. No rash noted. Not diaphoretic. No erythema. No pallor.  Psychiatric: Normal mood and affect. Behavior, judgment, thought content normal.    Labs on Admission:  Basic Metabolic Panel:  Recent Labs Lab 08/12/14 0431  NA 136*  K 4.0  CL 93*  CO2 24  GLUCOSE 200*  BUN 16  CREATININE 0.91  CALCIUM 9.3   Liver Function Tests:  Recent Labs Lab 08/12/14 0431  AST 65*  ALT 90*  ALKPHOS 187*  BILITOT 6.5*  PROT 7.8  ALBUMIN 4.0    Recent Labs Lab 08/12/14 0431  LIPASE >3000*   No results found for this basename: AMMONIA,  in the last 168 hours CBC:  Recent Labs Lab 08/12/14 0431  WBC 15.6*  NEUTROABS 13.9*  HGB 14.0  HCT 41.1  MCV 93.4  PLT 206   Cardiac Enzymes: No results found for this basename: CKTOTAL, CKMB, CKMBINDEX, TROPONINI,  in the last 168 hours BNP: No components found with this basename: POCBNP,  CBG: No results found for this basename: GLUCAP,  in the last 168 hours  If 7PM-7AM, please contact night-coverage [email or web address] Password [email or web address] 08/12/2014, 9:21 AM

## 2014-08-12 NOTE — ED Notes (Signed)
Per pt he has been very SOB and could not sleep d/t upset stomach.  Pt is dyspneic at rest.  Pt rates abdominal pain 10/10 feels like he is bloated.

## 2014-08-13 ENCOUNTER — Inpatient Hospital Stay (HOSPITAL_COMMUNITY): Payer: Medicare Other

## 2014-08-13 DIAGNOSIS — E119 Type 2 diabetes mellitus without complications: Secondary | ICD-10-CM

## 2014-08-13 DIAGNOSIS — R7989 Other specified abnormal findings of blood chemistry: Secondary | ICD-10-CM

## 2014-08-13 LAB — CBC
HCT: 34.3 % — ABNORMAL LOW (ref 39.0–52.0)
Hemoglobin: 11.5 g/dL — ABNORMAL LOW (ref 13.0–17.0)
MCH: 31.9 pg (ref 26.0–34.0)
MCHC: 33.5 g/dL (ref 30.0–36.0)
MCV: 95.3 fL (ref 78.0–100.0)
PLATELETS: 124 10*3/uL — AB (ref 150–400)
RBC: 3.6 MIL/uL — AB (ref 4.22–5.81)
RDW: 13.4 % (ref 11.5–15.5)
WBC: 11 10*3/uL — ABNORMAL HIGH (ref 4.0–10.5)

## 2014-08-13 LAB — COMPREHENSIVE METABOLIC PANEL
ALK PHOS: 123 U/L — AB (ref 39–117)
ALT: 44 U/L (ref 0–53)
AST: 29 U/L (ref 0–37)
Albumin: 2.7 g/dL — ABNORMAL LOW (ref 3.5–5.2)
Anion gap: 12 (ref 5–15)
BILIRUBIN TOTAL: 2.4 mg/dL — AB (ref 0.3–1.2)
BUN: 21 mg/dL (ref 6–23)
CHLORIDE: 99 meq/L (ref 96–112)
CO2: 23 meq/L (ref 19–32)
Calcium: 8.2 mg/dL — ABNORMAL LOW (ref 8.4–10.5)
Creatinine, Ser: 1.14 mg/dL (ref 0.50–1.35)
GFR, EST AFRICAN AMERICAN: 68 mL/min — AB (ref >90)
GFR, EST NON AFRICAN AMERICAN: 59 mL/min — AB (ref >90)
GLUCOSE: 149 mg/dL — AB (ref 70–99)
POTASSIUM: 3.8 meq/L (ref 3.7–5.3)
SODIUM: 134 meq/L — AB (ref 137–147)
Total Protein: 5.9 g/dL — ABNORMAL LOW (ref 6.0–8.3)

## 2014-08-13 LAB — EXPECTORATED SPUTUM ASSESSMENT W REFEX TO RESP CULTURE

## 2014-08-13 LAB — URINALYSIS, ROUTINE W REFLEX MICROSCOPIC
Glucose, UA: NEGATIVE mg/dL
Hgb urine dipstick: NEGATIVE
Ketones, ur: NEGATIVE mg/dL
NITRITE: NEGATIVE
Protein, ur: 100 mg/dL — AB
SPECIFIC GRAVITY, URINE: 1.028 (ref 1.005–1.030)
Urobilinogen, UA: 1 mg/dL (ref 0.0–1.0)
pH: 5.5 (ref 5.0–8.0)

## 2014-08-13 LAB — LEGIONELLA ANTIGEN, URINE: Legionella Antigen, Urine: NEGATIVE

## 2014-08-13 LAB — URINE MICROSCOPIC-ADD ON

## 2014-08-13 LAB — LIPASE, BLOOD: LIPASE: 255 U/L — AB (ref 11–59)

## 2014-08-13 LAB — GLUCOSE, CAPILLARY
Glucose-Capillary: 125 mg/dL — ABNORMAL HIGH (ref 70–99)
Glucose-Capillary: 121 mg/dL — ABNORMAL HIGH (ref 70–99)

## 2014-08-13 LAB — EXPECTORATED SPUTUM ASSESSMENT W GRAM STAIN, RFLX TO RESP C

## 2014-08-13 MED ORDER — BUDESONIDE-FORMOTEROL FUMARATE 160-4.5 MCG/ACT IN AERO
2.0000 | INHALATION_SPRAY | Freq: Two times a day (BID) | RESPIRATORY_TRACT | Status: DC
  Administered 2014-08-13 – 2014-08-17 (×9): 2 via RESPIRATORY_TRACT
  Filled 2014-08-13: qty 6

## 2014-08-13 MED ORDER — METFORMIN HCL 850 MG PO TABS
850.0000 mg | ORAL_TABLET | Freq: Two times a day (BID) | ORAL | Status: DC
Start: 2014-08-15 — End: 2014-08-13

## 2014-08-13 MED ORDER — SODIUM CHLORIDE 0.9 % IV SOLN
1.5000 g | Freq: Four times a day (QID) | INTRAVENOUS | Status: DC
  Administered 2014-08-13 – 2014-08-18 (×20): 1.5 g via INTRAVENOUS
  Filled 2014-08-13 (×22): qty 1.5

## 2014-08-13 NOTE — Progress Notes (Signed)
Patient ID: Jim [name], male   DOB: 1932-01-29, 80 y.o.   MRN: [id number]  [facility] Progress Note  Subjective: Up in chair-feeling better-hungry, waiting to try clears- no pain meds today Lipase down -255  Objective:  Vital signs in last 24 hours: Temp:  [98.5 F (36.9 C)-101.6 F (38.7 C)] 100.1 F (37.8 C) (09/20 0500) Pulse Rate:  [75-113] 81 (09/20 0500) Resp:  [20] 20 (09/20 0500) BP: (83-165)/(48-102) 125/72 mmHg (09/20 0500) SpO2:  [83 %-96 %] 83 % (09/20 0917) Weight:  [172 lb 3.2 oz (78.109 kg)-175 lb 11.2 oz (79.697 kg)] 172 lb 3.2 oz (78.109 kg) (09/20 0500) Last BM Date: 08/13/14 General:   Alert,  Well-developed, mildly jaundiced elderly wM   in NAD Heart:  Regular rate and rhythm; no murmurs Pulm;clear Abdomen:  Soft, mildly tender across upper abdomen ,mildly distended. Normal bowel sounds, without guarding, and without rebound.   Extremities:  Without edema. Neurologic:  Alert and  oriented x4;  grossly normal neurologically. Psych:  Alert and cooperative. Normal mood and affect.  Intake/Output from previous day: 09/19 0701 - 09/20 0700 In: 1616.7 [I.V.:1466.7; IV Piggyback:150] Out: 100 [Urine:100] Intake/Output this shift:    Lab Results:  Recent Labs  08/12/14 0431 08/13/14 0554  WBC 15.6* 11.0*  HGB 14.0 11.5*  HCT 41.1 34.3*  PLT 206 124*   BMET  Recent Labs  08/12/14 0431 08/13/14 0554  NA 136* 134*  K 4.0 3.8  CL 93* 99  CO2 24 23  GLUCOSE 200* 149*  BUN 16 21  CREATININE 0.91 1.14  CALCIUM 9.3 8.2*   LFT  Recent Labs  08/13/14 0554  PROT 5.9*  ALBUMIN 2.7*  AST 29  ALT 44  ALKPHOS 123*  BILITOT 2.4*     Assessment / Plan: #1 80 yo male with acute biliary pancreatitis- stable, and improving- suspect CBD sludge/stone Clear liquid diet today  MRCP tomorrow, follow up labs in am #2 renal mass noted on CT concerning for renal cell CA - MRI #3 COPD #4 AODM     LOS: 1 day   Amy Esterwood   08/13/2014, 9:33 AM   Agree with Ms. Genia Harold assessment and plan. Gatha Mayer, MD, [facility]

## 2014-08-13 NOTE — Progress Notes (Signed)
INITIAL NUTRITION ASSESSMENT  DOCUMENTATION CODES Per approved criteria  -Not Applicable   INTERVENTION: - Continue to encourage adequate po with a variety of food.  - RD will continue to monitor for nutrition care plan.  NUTRITION DIAGNOSIS: Inadequate oral intake related to inability to eat as evidenced by NPO and clear liquid diet.   Goal: Pt to meet >/= 90% of their estimated nutrition needs   Monitor:  Weight trend, po intake, labs  Reason for Assessment: Consult for nutrition assessment  80 y.o. male  Admitting Dx: Acute respiratory failure with hypoxia  ASSESSMENT: 80 year old male with past medical history of hypertension, dyslipidemia, diabetes, chronic diastolic CHF, history of prostate cancer who presented to [facility] ED 08/12/2014 with worsening abdominal pain over past 24 hours prior to this admission.   - Pt with acute pancreatitis and ARF.  - Pt reports that he was eating well prior to admission. He has lost 3 lbs due to being in the hospitals, but says that he will be able to eat well once his diet is upgraded. He said that he has felt hungry and wanted to know when he would be able to get solid food.  - Pt with no signs of fat or muscle wasting.  Labs: CBGs 125-205  Height: Ht Readings from Last 1 Encounters:  08/12/14 5\' 10"  (1.778 m)    Weight: Wt Readings from Last 1 Encounters:  08/13/14 172 lb 3.2 oz (78.109 kg)    Ideal Body Weight: 73 kg  % Ideal Body Weight: 107%  Wt Readings from Last 10 Encounters:  08/13/14 172 lb 3.2 oz (78.109 kg)  08/02/14 175 lb 9.6 oz (79.652 kg)  07/18/14 174 lb (78.926 kg)  06/08/14 171 lb (77.565 kg)  02/06/14 166 lb (75.297 kg)  01/26/14 165 lb 9.6 oz (75.116 kg)  12/07/13 159 lb (72.122 kg)  11/11/13 156 lb (70.761 kg)  11/02/13 160 lb (72.576 kg)  10/05/13 171 lb (77.565 kg)    Usual Body Weight: 175 lbs  % Usual Body Weight: 98%  BMI:  Body mass index is 24.71 kg/(m^2).  Estimated Nutritional  Needs: Kcal: 5366-4403 Protein: 100-115 g Fluid: 2.0-2.3 L/day  Skin: Intact  Diet Order: Clear Liquid  EDUCATION NEEDS: -Education needs addressed   Intake/Output Summary (Last 24 hours) at 08/13/14 1354 Last data filed at 08/13/14 0600  Gross per 24 hour  Intake 1616.66 ml  Output    100 ml  Net 1516.66 ml    Last BM: 9/20   Labs:   Recent Labs Lab 08/12/14 0431 08/13/14 0554  NA 136* 134*  K 4.0 3.8  CL 93* 99  CO2 24 23  BUN 16 21  CREATININE 0.91 1.14  CALCIUM 9.3 8.2*  GLUCOSE 200* 149*    CBG (last 3)   Recent Labs  08/12/14 1706 08/12/14 2042 08/13/14 0755  GLUCAP 205* 171* 125*    Scheduled Meds: . ampicillin-sulbactam (UNASYN) IV  1.5 g Intravenous 4 times per day  . aspirin EC  81 mg Oral Daily  . atorvastatin  20 mg Oral q1800  . budesonide-formoterol  2 puff Inhalation BID  . carvedilol  25 mg Oral BID WC  . enoxaparin (LOVENOX) injection  40 mg Subcutaneous Q24H  . guaiFENesin  600 mg Oral Daily  . insulin aspart  0-9 Units Subcutaneous TID WC  . ipratropium-albuterol  3 mL Nebulization Q6H WA  . sodium chloride  3 mL Intravenous Q12H    Continuous Infusions: . sodium chloride 100  mL/hr at 08/13/14 0417    Past Medical History  Diagnosis Date  . DM (diabetes mellitus) 10/2009  . COPD (chronic obstructive pulmonary disease)   . Abnormal chest xray     last XR 11-2010, no further  f/u CXRs  . Syncope 2010    saw cards   . Hyperlipidemia   . Hypertension   . Prostate cancer 2008    s/p XRT  . ED (erectile dysfunction)   . 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  . Personal history of colonic polyps     none in 2010    Past Surgical History  Procedure Laterality Date  . Cataract extraction      bilaterally  . Eye surgery Left 07/17/2014  . Colonoscopy      Terrace Arabia RD, LDN

## 2014-08-13 NOTE — Progress Notes (Signed)
SATURATION QUALIFICATIONS: (This note is used to comply with regulatory documentation for home oxygen)  Patient Saturations on Room Air at Rest = 93%  Patient Saturations on Room Air while Ambulating = 86%  Patient Saturations on 3 Liters of oxygen while Ambulating = 91%  Please briefly explain why patient needs home oxygen: Pt states that he feels like his breathing is fine even with 2/4 DOE, when checked on RA with ambulation his sats register 86% which is not fine. Pt unaware that his oxygen is dropping that low without use of O2.

## 2014-08-13 NOTE — Evaluation (Signed)
Physical Therapy Evaluation Patient Details Name: Jim [name] MRN: [id number] DOB: 16-Jul-1932 Today's Date: 08/13/2014   History of Present Illness  Admiited with abdominal pain, shortness of breath--pancreatitis and 16 mm solid left lower pole renal mass ? CA . PMHx:HTN, DM, CHF, prostrate CA  Clinical Impression  *Pt admitted with *acute respiratory failure, pancreatitis*. Pt currently with functional limitations due to the deficits listed below (see PT Problem List).  Pt will benefit from skilled PT to increase their independence and safety with mobility to allow discharge to the venue listed below.   Pt walked 300' without assistive device, no loss of balance. SaO2 dropped to 83% on RA, 87% on 4L walking. Pt has home O2 but uses it only a few times per week. HHPT recommended to assess home safety and reinforce use of O2 as needed.   **    Follow Up Recommendations Home health PT    Equipment Recommendations  None recommended by PT    Recommendations for Other Services       Precautions / Restrictions Precautions Precaution Comments: O2 drops with ambulation--pt has O2 at home, but says he only uses it when he feels he needs it. Restrictions Weight Bearing Restrictions: No      Mobility  Bed Mobility Overal bed mobility: Independent                Transfers Overall transfer level: Needs assistance   Transfers: Sit to/from Stand Sit to Stand: Supervision            Ambulation/Gait Ambulation/Gait assistance: Modified independent (Device/Increase time) Ambulation Distance (Feet): 300 Feet Assistive device: None Gait Pattern/deviations: WFL(Within Functional Limits)   Gait velocity interpretation: at or above normal speed for age/gender General Gait Details: steady without AD, no LOB, SaO2 87% on 4L O2, frequent cues for pursed lip breathing required; 2/4 dyspnea noted  Stairs            Wheelchair Mobility    Modified Rankin (Stroke  Patients Only)       Balance Overall balance assessment: No apparent balance deficits (not formally assessed)                                           Pertinent Vitals/Pain Pain Assessment: No/denies pain    Home Living Family/patient expects to be discharged to:: Private residence Living Arrangements:  (son and daughter-in-law) Available Help at Discharge: Family (they work during the day) Type of Home: House Home Access: Level entry     Home Layout: One level Home Equipment:  (O2)      Prior Function Level of Independence: Independent               Hand Dominance   Dominant Hand: Right    Extremity/Trunk Assessment   Upper Extremity Assessment: Overall WFL for tasks assessed           Lower Extremity Assessment: Overall WFL for tasks assessed      Cervical / Trunk Assessment: Normal  Communication   Communication: No difficulties  Cognition Arousal/Alertness: Awake/alert Behavior During Therapy: WFL for tasks assessed/performed Overall Cognitive Status:  (unaware his O2 is dropping into the mid 80's and said he was not on anything for DM but per his home meds he is )  General Comments General comments (skin integrity, edema, etc.): pt denies falls in past year    Exercises        Assessment/Plan    PT Assessment Patient needs continued PT services  PT Diagnosis Generalized weakness   PT Problem List Cardiopulmonary status limiting activity;Decreased mobility;Decreased activity tolerance  PT Treatment Interventions Gait training;Functional mobility training;Patient/family education;Therapeutic exercise   PT Goals (Current goals can be found in the Care Plan section) Acute Rehab PT Goals Patient Stated Goal: home soon to see his chihuahua PT Goal Formulation: With patient Time For Goal Achievement: 08/27/14 Potential to Achieve Goals: Good    Frequency Min 3X/week   Barriers to discharge         Co-evaluation               End of Session Equipment Utilized During Treatment: Gait belt;Oxygen Activity Tolerance: Patient tolerated treatment well Patient left: in chair;with call bell/phone within reach Nurse Communication: Mobility status         Time: 7680-8811 PT Time Calculation (min): 21 min   Charges:   PT Evaluation $Initial PT Evaluation Tier I: 1 Procedure PT Treatments $Gait Training: 8-22 mins   PT G Codes:          Philomena Doheny 08/13/2014, 9:20 AM [phone]

## 2014-08-13 NOTE — Evaluation (Signed)
Occupational Therapy Evaluation Patient Details Name: Jim [name] MRN: [id number] DOB: 1931/12/13 Today's Date: 08/13/2014    History of Present Illness Admiited with abdominal pain, shortness of breath--pancreatitis and 16 mm solid left lower pole renal mass ? CA . PMHx:HTN, DM, CHF, prostrate CA   Clinical Impression   This 80 yo male admitted with above presents to acute OT with decreased O2 sats on RA, DOE (audible, but pt states his breathing is fine), generalized weakness all affecting his ability to be Mod I at home as he was pta. He will benefit from acute OT without need for follow up.    Follow Up Recommendations  No OT follow up    Equipment Recommendations  Tub/shower seat       Precautions / Restrictions Precautions Precaution Comments: O2 drops with ambulation--pt has O2 at home, but says he only uses it when he feels he needs it. Restrictions Weight Bearing Restrictions: No      Mobility Bed Mobility Overal bed mobility: Independent                Transfers Overall transfer level: Needs assistance   Transfers: Sit to/from Stand Sit to Stand: Supervision                   ADL Overall ADL's : Needs assistance/impaired Eating/Feeding: Independent;Sitting   Grooming: Supervision/safety;Standing   Upper Body Bathing: Supervision/ safety;Sitting;Standing   Lower Body Bathing: Supervison/ safety;Sit to/from stand   Upper Body Dressing : Supervision/safety;Sitting   Lower Body Dressing: Supervision/safety;Sit to/from stand   Toilet Transfer: Supervision/safety;Ambulation;Regular Toilet;Grab bars   Toileting- Clothing Manipulation and Hygiene: Supervision/safety;Sit to/from stand                         Pertinent Vitals/Pain Pain Assessment: No/denies pain     Hand Dominance Right   Extremity/Trunk Assessment Upper Extremity Assessment Upper Extremity Assessment: Overall WFL for tasks assessed   Lower Extremity  Assessment Lower Extremity Assessment: Defer to PT evaluation       Communication Communication Communication: No difficulties   Cognition Arousal/Alertness: Awake/alert Behavior During Therapy: WFL for tasks assessed/performed Overall Cognitive Status:  (unaware his O2 is dropping into the mid 80's and said he was not on anything for DM but per his home meds he is )                                Home Living Family/patient expects to be discharged to:: Private residence Living Arrangements:  (son and daughter-in-law) Available Help at Discharge: Family (they work during the day) Type of Home: House Home Access: Level entry     Home Layout: One level     Bathroom Shower/Tub: Tub/shower unit;Curtain Shower/tub characteristics: Architectural technologist: Standard     Home Equipment:  (O2)          Prior Functioning/Environment Level of Independence: Independent             OT Diagnosis: Generalized weakness   OT Problem List: Cardiopulmonary status limiting activity   OT Treatment/Interventions: Self-care/ADL training;Patient/family education;Balance training;DME and/or AE instruction    OT Goals(Current goals can be found in the care plan section) Acute Rehab OT Goals Patient Stated Goal: home soon OT Goal Formulation: With patient Time For Goal Achievement: 08/20/14 Potential to Achieve Goals: Good  OT Frequency: Min 2X/week   Barriers to D/C: Decreased caregiver support  End of Session Equipment Utilized During Treatment:  (pushed IV pole) Nurse Communication:  (O2 dropped to 86% on RA with ambulation, left pt on 3 liters in the room, which he was on when I entered his room)  Activity Tolerance: Patient tolerated treatment well Patient left: in chair;with call bell/phone within reach   Time: 0812-0836 OT Time Calculation (min): 24 min Charges:  OT General Charges $OT Visit: 1 Procedure OT Evaluation $Initial OT Evaluation  Tier I: 1 Procedure OT Treatments $Self Care/Home Management : 8-22 mins  Almon Register [phone] 08/13/2014, 8:54 AM

## 2014-08-13 NOTE — Progress Notes (Signed)
Pt's BP was 84/48 at 2047. Contacted hospitalist and received order to change IVF to NS. BP at re-assessment was 83/50. Contacted hospitalist again and received order for 539mL NS bolus. BP after bolus was 106/57; pt resting comfortably.

## 2014-08-13 NOTE — Progress Notes (Signed)
PROGRESS NOTE  Jim [name] TDV:[id number] DOB: 1932/08/25 DOA: 08/12/2014 PCP: [name], MD  Assessment/Plan: Acute on chronic respiratory failure -Likely multifactorial including the patient's COPD and possible aspiration pneumonitis -Patient is supposed to be on oxygen at home, but only uses it when necessary -Concerned about aspiration pneumonitis given the patient's intractable vomiting -Repeat chest x-ray -Continue aerosolized albuterol and Atrovent -Restart long-acting beta agonist Fever -The patient had a temperature of 101.41F yesterday evening -Repeat chest x-ray -Broad antibiotic coverage to cover for aspiration pneumonitis -Urine culture -Follow blood culture Acute pancreatitis -may have contributed to fever -concerned about biliary etiology due to CBD abnormality -LFTs, lipase improving -Appreciate GI followup -await MRCP  -continue clear liquids -check lipids -08/12/2014 CT abdomen and pelvis reveals--Focal midline retroperitoneal inflammation, Nonobstructing subcentimeter distal Common bile duct lesion, 16 mm solid LEFT lower pole renal mass Renal mass -Order MRI of the kidneys--hopefully can be obtained at time of MRCP  Chronic diastolic CHF  -Appears euvolemic  -continue carvedilol COPD  -Stable  -Restart long-acting beta agonist  -Continue aerosolized albuterol and Atrovent  Hypertension  -Discontinue losartan as the patient's blood pressure soft  -Continue carvedilol  Diabetes mellitus type 2  -Controlled  -Discontinue metformin  -NovoLog sliding scale   Family Communication:   Family updated at beside Disposition Plan:   Home when medically stable   Antibiotics:  Levofloxacin 9/19>>9/20  unasyn  08/13/14>>>    Procedures/Studies: Dg Chest 2 View  08/12/2014   CLINICAL DATA:  Severe shortness of breath. Dyspnea. Abdominal pain and bloating.  EXAM: CHEST  2 VIEW  COMPARISON:  Chest radiograph performed 12/23/2010  FINDINGS:  The lungs are well-aerated. Right mid lung scarring is noted. Minimal left basilar opacity may reflect atelectasis or possibly mild pneumonia, given the patient's symptoms. Mild vascular congestion is noted. There is no evidence of pleural effusion or pneumothorax.  The heart is normal in size; the mediastinal contour is within normal limits. No acute osseous abnormalities are seen.  IMPRESSION: Right mid lung scarring again noted; minimal left basilar airspace opacity may reflect atelectasis or possibly mild pneumonia, given the patient's symptoms. Mild vascular congestion noted.   Electronically Signed   By: Garald Balding M.D.   On: 08/12/2014 05:22   Ct Abdomen Pelvis W Contrast  08/12/2014   CLINICAL DATA:  Severe shortness of breath and severe abdominal pain.  EXAM: CT ABDOMEN AND PELVIS WITH CONTRAST  TECHNIQUE: Multidetector CT imaging of the abdomen and pelvis was performed using the standard protocol following bolus administration of intravenous contrast.  CONTRAST:  7mL OMNIPAQUE IOHEXOL 300 MG/ML SOLN, 172mL OMNIPAQUE IOHEXOL 300 MG/ML SOLN  COMPARISON:  Abdominal ultrasound November 10, 2013  FINDINGS: LUNG BASES: Limited view of the lung bases demonstrates severe centrilobular emphysema. Dependent atelectasis. Heart size is normal. Coronary artery calcifications. Included pericardium is unremarkable.  SOLID ORGANS: The liver, spleen, gallbladder, and adrenal glands are unremarkable. Focal midline retroperitoneal inflammation, contiguous with the inferior margin of the pancreas and, ligaments of Treitz.  GASTROINTESTINAL TRACT: Small hiatal hernia, with under amount of surrounding mildly inflamed fat. The stomach, small and large bowel are normal in course and caliber without inflammatory changes. Suspected proximal small bowel wall thickening. Mild colonic diverticulosis. Normal appendix.  KIDNEYS/ URINARY TRACT: Kidneys are orthotopic, demonstrating symmetric enhancement. Solid enhancing LEFT  lower pole 16 mm mass with mild washout on delayed phase. No nephrolithiasis, hydronephrosis. 10 mm RIGHT lower pole cysts. Too small to  characterize RIGHT interpolar hypodensity. The unopacified ureters are normal in course and caliber. Delayed imaging through the kidneys demonstrates symmetric prompt contrast excretion within the proximal urinary collecting system. Urinary bladder is partially distended and unremarkable.  PERITONEUM/RETROPERITONEUM: The Common bile duct is not enlarged, however there is a 5 mm soft tissue lesion along the posterior wall of the distal Common bile duct, 51/101 and 50/119. No intraperitoneal free fluid nor free air. Aortoiliac vessels are normal in course and caliber, moderate calcific atherosclerosis. No lymphadenopathy by CT size criteria. Prostate brachytherapy seeds. Partially imaged small LEFT hydrocele.  SOFT TISSUE/OSSEOUS STRUCTURES: Nonsuspicious. Grade 1 L4-5 anterolisthesis on spondylotic basis.  IMPRESSION: Focal midline retroperitoneal inflammation, unclear if this is reflects sequelae duodenitis or possible pancreatitis. Suspected mild proximal small bowel wall thickening.  Nonobstructing subcentimeter distal Common bile duct lesion, which could reflect polyp or less likely neoplasm, possible sludge ball. Recommend MRCP on a nonemergent basis.  Small hiatal hernia with moderate amount of associated mildly inflamed fat, which is possibly tracking from the retroperitoneum.  16 mm solid LEFT lower pole renal mass highly concerning for renal cell carcinoma. Recommend further characterization with MRI, biopsy or surgery. This recommendation follows ACR consensus guidelines: Managing Incidental Findings on Abdominal CT: White Paper of the ACR Incidental Findings Committee. [facility] Radiol [phone]   Electronically Signed   By: Elon Alas   On: 08/12/2014 06:58         Subjective: Patient this abdominal pain is better. Breathing is back to normal. Denies  any fevers, chills, chest pain, shortness breath, nausea, vomiting, diarrhea, abdominal pain. He is passing flatus but has not had a bowel movement. Felt that his abdominal pain cause of shortness of breath.   Objective: Filed Vitals:   08/12/14 2300 08/13/14 0500 08/13/14 0804 08/13/14 0917  BP: 106/57 125/72    Pulse: 75 81    Temp:  100.1 F (37.8 C)    TempSrc:  Oral    Resp:  20    Height:      Weight:  78.109 kg (172 lb 3.2 oz)    SpO2:  96% 93% 83%    Intake/Output Summary (Last 24 hours) at 08/13/14 1047 Last data filed at 08/13/14 0600  Gross per 24 hour  Intake 1616.66 ml  Output    100 ml  Net 1516.66 ml   Weight change: 0.318 kg (11.2 oz) Exam:   General:  Pt is alert, follows commands appropriately, not in acute distress  HEENT: No icterus, No thrush,  [place]/AT  Cardiovascular: RRR, S1/S2, no rubs, no gallops  Respiratory: R-bibasilar crackles. Left clear to auscultation; diminished breath sounds bilateral   Abdomen: Soft/+BS, non tender, non distended, no guarding  Extremities: No edema, No lymphangitis, No petechiae, No rashes, no synovitis  Data Reviewed: Basic Metabolic Panel:  Recent Labs Lab 08/12/14 0431 08/13/14 0554  NA 136* 134*  K 4.0 3.8  CL 93* 99  CO2 24 23  GLUCOSE 200* 149*  BUN 16 21  CREATININE 0.91 1.14  CALCIUM 9.3 8.2*   Liver Function Tests:  Recent Labs Lab 08/12/14 0431 08/13/14 0554  AST 65* 29  ALT 90* 44  ALKPHOS 187* 123*  BILITOT 6.5* 2.4*  PROT 7.8 5.9*  ALBUMIN 4.0 2.7*    Recent Labs Lab 08/12/14 0431 08/13/14 0554  LIPASE >3000* 255*   No results found for this basename: AMMONIA,  in the last 168 hours CBC:  Recent Labs Lab 08/12/14 0431 08/13/14 0554  WBC  15.6* 11.0*  NEUTROABS 13.9*  --   HGB 14.0 11.5*  HCT 41.1 34.3*  MCV 93.4 95.3  PLT 206 124*   Cardiac Enzymes: No results found for this basename: CKTOTAL, CKMB, CKMBINDEX, TROPONINI,  in the last 168 hours BNP: No components  found with this basename: POCBNP,  CBG:  Recent Labs Lab 08/12/14 1209 08/12/14 1706 08/12/14 2042 08/13/14 0755  GLUCAP 169* 205* 171* 125*    Recent Results (from the past 240 hour(s))  CULTURE, EXPECTORATED SPUTUM-ASSESSMENT     Status: None   Collection Time    08/13/14  1:56 AM      Result Value Ref Range Status   Specimen Description SPUTUM   Final   Special Requests NONE   Final   Sputum evaluation     Final   Value: MICROSCOPIC FINDINGS SUGGEST THAT THIS SPECIMEN IS NOT REPRESENTATIVE OF LOWER RESPIRATORY SECRETIONS. PLEASE RECOLLECT.     Enid Derry AT 0216 ON 09.20.2015 BY NBROOKS   Report Status 08/13/2014 FINAL   Final  CULTURE, EXPECTORATED SPUTUM-ASSESSMENT     Status: None   Collection Time    08/13/14  6:47 AM      Result Value Ref Range Status   Specimen Description SPUTUM   Final   Special Requests NONE   Final   Sputum evaluation     Final   Value: THIS SPECIMEN IS ACCEPTABLE. RESPIRATORY CULTURE REPORT TO FOLLOW.   Report Status 08/13/2014 FINAL   Final     Scheduled Meds: . aspirin EC  81 mg Oral Daily  . atorvastatin  20 mg Oral q1800  . carvedilol  25 mg Oral BID WC  . enoxaparin (LOVENOX) injection  40 mg Subcutaneous Q24H  . guaiFENesin  600 mg Oral Daily  . insulin aspart  0-9 Units Subcutaneous TID WC  . ipratropium-albuterol  3 mL Nebulization Q6H WA  . levofloxacin (LEVAQUIN) IV  750 mg Intravenous Q24H  . sodium chloride  3 mL Intravenous Q12H   Continuous Infusions: . sodium chloride 100 mL/hr at 08/13/14 0417     Bellamie Turney, DO  [facility] Pager [phone]  If 7PM-7AM, please contact night-coverage [email or web address] Password [email or web address] 08/13/2014, 10:47 AM   LOS: 1 day

## 2014-08-14 ENCOUNTER — Inpatient Hospital Stay (HOSPITAL_COMMUNITY): Payer: Medicare Other

## 2014-08-14 DIAGNOSIS — D72829 Elevated white blood cell count, unspecified: Secondary | ICD-10-CM

## 2014-08-14 DIAGNOSIS — R0902 Hypoxemia: Secondary | ICD-10-CM

## 2014-08-14 DIAGNOSIS — J9620 Acute and chronic respiratory failure, unspecified whether with hypoxia or hypercapnia: Secondary | ICD-10-CM

## 2014-08-14 DIAGNOSIS — J690 Pneumonitis due to inhalation of food and vomit: Secondary | ICD-10-CM

## 2014-08-14 LAB — LIPID PANEL
CHOL/HDL RATIO: 6.6 ratio
CHOLESTEROL: 99 mg/dL (ref 0–200)
HDL: 15 mg/dL — AB (ref >39)
LDL Cholesterol: 53 mg/dL (ref 0–99)
Triglycerides: 156 mg/dL — ABNORMAL HIGH (ref <150)
VLDL: 31 mg/dL (ref 0–40)

## 2014-08-14 LAB — LIPASE, BLOOD: Lipase: 113 U/L — ABNORMAL HIGH (ref 11–59)

## 2014-08-14 LAB — GLUCOSE, CAPILLARY
GLUCOSE-CAPILLARY: 170 mg/dL — AB (ref 70–99)
GLUCOSE-CAPILLARY: 137 mg/dL — AB (ref 70–99)
Glucose-Capillary: 152 mg/dL — ABNORMAL HIGH (ref 70–99)
Glucose-Capillary: 155 mg/dL — ABNORMAL HIGH (ref 70–99)
Glucose-Capillary: 135 mg/dL — ABNORMAL HIGH (ref 70–99)

## 2014-08-14 LAB — COMPREHENSIVE METABOLIC PANEL
ALT: 33 U/L (ref 0–53)
ANION GAP: 14 (ref 5–15)
AST: 22 U/L (ref 0–37)
Albumin: 2.6 g/dL — ABNORMAL LOW (ref 3.5–5.2)
Alkaline Phosphatase: 123 U/L — ABNORMAL HIGH (ref 39–117)
BUN: 14 mg/dL (ref 6–23)
CALCIUM: 8.2 mg/dL — AB (ref 8.4–10.5)
CO2: 21 mEq/L (ref 19–32)
CREATININE: 0.8 mg/dL (ref 0.50–1.35)
Chloride: 101 mEq/L (ref 96–112)
GFR, EST NON AFRICAN AMERICAN: 82 mL/min — AB (ref >90)
GLUCOSE: 158 mg/dL — AB (ref 70–99)
Potassium: 3.8 mEq/L (ref 3.7–5.3)
SODIUM: 136 meq/L — AB (ref 137–147)
TOTAL PROTEIN: 6 g/dL (ref 6.0–8.3)
Total Bilirubin: 2.1 mg/dL — ABNORMAL HIGH (ref 0.3–1.2)

## 2014-08-14 LAB — CBC
HCT: 34.7 % — ABNORMAL LOW (ref 39.0–52.0)
Hemoglobin: 11.6 g/dL — ABNORMAL LOW (ref 13.0–17.0)
MCH: 31.6 pg (ref 26.0–34.0)
MCHC: 33.4 g/dL (ref 30.0–36.0)
MCV: 94.6 fL (ref 78.0–100.0)
PLATELETS: 120 10*3/uL — AB (ref 150–400)
RBC: 3.67 MIL/uL — AB (ref 4.22–5.81)
RDW: 13 % (ref 11.5–15.5)
WBC: 10.1 10*3/uL (ref 4.0–10.5)

## 2014-08-14 LAB — URINE CULTURE
COLONY COUNT: NO GROWTH
Culture: NO GROWTH

## 2014-08-14 MED ORDER — GADOBENATE DIMEGLUMINE 529 MG/ML IV SOLN
20.0000 mL | Freq: Once | INTRAVENOUS | Status: AC | PRN
  Administered 2014-08-14: 16 mL via INTRAVENOUS

## 2014-08-14 NOTE — Progress Notes (Signed)
Alert & oriented, able to make needs known. Oral mucosa pink & moist, tolerates thin liquids well. Takes PO meds whole w/o difficulty. Pleasant & cooperative. Ambulates w/ x 1 person assist. Continent of b&b. Feeds self. No c/o pain or discomfort noted at this time. Family at bedside at this time.

## 2014-08-14 NOTE — Progress Notes (Signed)
Patient currently undergoing MRI/MRCP.  Will return to see him later after that study.

## 2014-08-14 NOTE — Consult Note (Signed)
Urology Consult   Physician requesting consult: Tat  Reason for consult: Left renal mass  History of Present Illness: Jim [name] is a 80 y.o. male with PMH significant for DM, COPD, CaP, CHF, and HTN who was recently admitted for eval of abdominal pain with N/V. Pt was found to have acute pancreatitis with a distal CBD filling defect concerning for a stone.  GI is planning to perform an ERCP on 08/15/14.  He is also suspected to have an aspiration pneumonitis which is being treated with Unasyn.  During the evaluation, CT and MRI revealed a 58mm left lower pole renal mass concerning for RCC.  Cr is 0.8 and urine is WNL.    He denies hx of voiding/storage issues, UTIs, and nephrolithiasis.  He states over the past two weeks he has noted intermittent episodes of painless bright red urine.  He is currently resting without complaints. He denies HA, CP, SOB, N/V, abdominal pain, and back pain.   Pt has a hx of prostate cancer treated with IMRT completed in 2009.  He was previously followed for this by Dr. Karsten Ro.    Past Medical History  Diagnosis Date  . DM (diabetes mellitus) 10/2009  . COPD (chronic obstructive pulmonary disease)   . Abnormal chest xray     last XR 11-2010, no further  f/u CXRs  . Syncope 2010    saw cards   . Hyperlipidemia   . Hypertension   . Prostate cancer 2008    s/p XRT  . ED (erectile dysfunction)   . 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  . Personal history of colonic polyps     none in 2010    Past Surgical History  Procedure Laterality Date  . Cataract extraction      bilaterally  . Eye surgery Left 07/17/2014  . Colonoscopy      Current Hospital Medications:  Home Meds:    Medication List    ASK your doctor about these medications       albuterol (2.5 MG/3ML) 0.083% nebulizer solution  Commonly known as:  PROVENTIL  Take 3 mLs (2.5 mg total) by nebulization 4 (four) times daily as needed.     aspirin 81 MG tablet  Take  81 mg by mouth daily.     atorvastatin 20 MG tablet  Commonly known as:  LIPITOR  take 1 tablet by mouth once daily     carvedilol 25 MG tablet  Commonly known as:  COREG  take 1 tablet by mouth twice a day with meals     COMBIVENT RESPIMAT 20-100 MCG/ACT Aers respimat  Generic drug:  Ipratropium-Albuterol  Inhale 1 puff into the lungs every 6 (six) hours as needed for wheezing.     furosemide 20 MG tablet  Commonly known as:  LASIX  Take 20 mg by mouth daily.     guaiFENesin 600 MG 12 hr tablet  Commonly known as:  MUCINEX  Take 600 mg by mouth daily.     losartan 100 MG tablet  Commonly known as:  COZAAR  take 1 tablet by mouth once daily     metFORMIN 500 MG tablet  Commonly known as:  GLUCOPHAGE  take 1 tablet by mouth twice a day with meals     PRESERVISION AREDS PO  Take 2 tablets by mouth daily.     SYMBICORT 160-4.5 MCG/ACT inhaler  Generic drug:  budesonide-formoterol  inhale 2 puffs by mouth twice a day  Scheduled Meds: . ampicillin-sulbactam (UNASYN) IV  1.5 g Intravenous 4 times per day  . aspirin EC  81 mg Oral Daily  . atorvastatin  20 mg Oral q1800  . budesonide-formoterol  2 puff Inhalation BID  . carvedilol  25 mg Oral BID WC  . enoxaparin (LOVENOX) injection  40 mg Subcutaneous Q24H  . guaiFENesin  600 mg Oral Daily  . insulin aspart  0-9 Units Subcutaneous TID WC  . ipratropium-albuterol  3 mL Nebulization Q6H WA  . sodium chloride  3 mL Intravenous Q12H   Continuous Infusions: . sodium chloride 100 mL/hr at 08/14/14 0359   PRN Meds:.acetaminophen, acetaminophen, albuterol, ipratropium-albuterol, morphine injection, ondansetron (ZOFRAN) IV, ondansetron  Allergies:  Allergies  Allergen Reactions  . Amlodipine Besylate     REACTION: swelling  . Cardura [Doxazosin Mesylate] Itching  . Clonidine Derivatives Itching  . Fluticasone-Salmeterol Hives and Itching    Family History  Problem Relation Age of Onset  . Cirrhosis Brother      liver  . Dementia Mother   . Colon cancer Neg Hx   . Prostate cancer Neg Hx   . CAD Neg Hx     Social History:  reports that he quit smoking about 8 years ago. His smoking use included Cigarettes. He has a 80 pack-year smoking history. He has never used smokeless tobacco. He reports that he drinks alcohol. He reports that he does not use illicit drugs.  ROS: A complete review of systems was performed.  All systems are negative except for pertinent findings as noted.  Physical Exam:  Vital signs in last 24 hours: Temp:  [98.3 F (36.8 C)-98.4 F (36.9 C)] 98.4 F (36.9 C) (09/21 0500) Pulse Rate:  [72-73] 72 (09/21 0500) Resp:  [18] 18 (09/21 0500) BP: (143-147)/(65-67) 143/65 mmHg (09/21 0500) SpO2:  [93 %-96 %] 94 % (09/21 0929) Weight:  [78.11 kg (172 lb 3.2 oz)] 78.11 kg (172 lb 3.2 oz) (09/21 0500) Constitutional:  Alert and oriented, No acute distress Cardiovascular: Regular rate and rhythm Respiratory: Normal respiratory effort GI: Abdomen is soft, nontender, nondistended, no abdominal masses GU: uncirc penis, no lesions, no discharge Lymphatic: No lymphadenopathy Neurologic: Grossly intact, no focal deficits Psychiatric: Normal mood and affect  Laboratory Data:   Recent Labs  08/12/14 0431 08/13/14 0554 08/14/14 0437  WBC 15.6* 11.0* 10.1  HGB 14.0 11.5* 11.6*  HCT 41.1 34.3* 34.7*  PLT 206 124* 120*     Recent Labs  08/12/14 0431 08/13/14 0554 08/14/14 0437  NA 136* 134* 136*  K 4.0 3.8 3.8  CL 93* 99 101  GLUCOSE 200* 149* 158*  BUN 16 21 14   CALCIUM 9.3 8.2* 8.2*  CREATININE 0.91 1.14 0.80     Results for orders placed during the hospital encounter of 08/12/14 (from the past 24 hour(s))  GLUCOSE, CAPILLARY     Status: Abnormal   Collection Time    08/13/14  5:24 PM      Result Value Ref Range   Glucose-Capillary 121 (*) 70 - 99 mg/dL   Comment 1 Documented in Chart     Comment 2 Notify RN    GLUCOSE, CAPILLARY     Status: Abnormal    Collection Time    08/13/14  9:04 PM      Result Value Ref Range   Glucose-Capillary 152 (*) 70 - 99 mg/dL  LIPASE, BLOOD     Status: Abnormal   Collection Time    08/14/14  4:37 AM  Result Value Ref Range   Lipase 113 (*) 11 - 59 U/L  COMPREHENSIVE METABOLIC PANEL     Status: Abnormal   Collection Time    08/14/14  4:37 AM      Result Value Ref Range   Sodium 136 (*) 137 - 147 mEq/L   Potassium 3.8  3.7 - 5.3 mEq/L   Chloride 101  96 - 112 mEq/L   CO2 21  19 - 32 mEq/L   Glucose, Bld 158 (*) 70 - 99 mg/dL   BUN 14  6 - 23 mg/dL   Creatinine, Ser 0.80  0.50 - 1.35 mg/dL   Calcium 8.2 (*) 8.4 - 10.5 mg/dL   Total Protein 6.0  6.0 - 8.3 g/dL   Albumin 2.6 (*) 3.5 - 5.2 g/dL   AST 22  0 - 37 U/L   ALT 33  0 - 53 U/L   Alkaline Phosphatase 123 (*) 39 - 117 U/L   Total Bilirubin 2.1 (*) 0.3 - 1.2 mg/dL   GFR calc non Af Amer 82 (*) >90 mL/min   GFR calc Af Amer >90  >90 mL/min   Anion gap 14  5 - 15  CBC     Status: Abnormal   Collection Time    08/14/14  4:37 AM      Result Value Ref Range   WBC 10.1  4.0 - 10.5 K/uL   RBC 3.67 (*) 4.22 - 5.81 MIL/uL   Hemoglobin 11.6 (*) 13.0 - 17.0 g/dL   HCT 34.7 (*) 39.0 - 52.0 %   MCV 94.6  78.0 - 100.0 fL   MCH 31.6  26.0 - 34.0 pg   MCHC 33.4  30.0 - 36.0 g/dL   RDW 13.0  11.5 - 15.5 %   Platelets 120 (*) 150 - 400 K/uL  LIPID PANEL     Status: Abnormal   Collection Time    08/14/14  4:38 AM      Result Value Ref Range   Cholesterol 99  0 - 200 mg/dL   Triglycerides 156 (*) <150 mg/dL   HDL 15 (*) >39 mg/dL   Total CHOL/HDL Ratio 6.6     VLDL 31  0 - 40 mg/dL   LDL Cholesterol 53  0 - 99 mg/dL  GLUCOSE, CAPILLARY     Status: Abnormal   Collection Time    08/14/14  7:42 AM      Result Value Ref Range   Glucose-Capillary 170 (*) 70 - 99 mg/dL  GLUCOSE, CAPILLARY     Status: Abnormal   Collection Time    08/14/14 11:40 AM      Result Value Ref Range   Glucose-Capillary 155 (*) 70 - 99 mg/dL   Recent Results (from  the past 240 hour(s))  CULTURE, BLOOD (ROUTINE X 2)     Status: None   Collection Time    08/12/14  9:19 AM      Result Value Ref Range Status   Specimen Description BLOOD RIGHT ARM   Final   Special Requests     Final   Value: BOTTLES DRAWN AEROBIC AND ANAEROBIC 10CC BLUE BOTTLE, 7 CC RED BOTTLE   Culture  Setup Time     Final   Value: 08/12/2014 13:42     Performed at [facility]   Culture     Final   Value:        BLOOD CULTURE RECEIVED NO GROWTH TO DATE CULTURE WILL BE HELD FOR 5  DAYS BEFORE ISSUING A FINAL NEGATIVE REPORT     Performed at [facility]   Report Status PENDING   Incomplete  CULTURE, BLOOD (ROUTINE X 2)     Status: None   Collection Time    08/12/14  9:24 AM      Result Value Ref Range Status   Specimen Description BLOOD RIGHT ARM   Final   Special Requests     Final   Value: BOTTLES DRAWN AEROBIC AND ANAEROBIC 10CC BOTH BOTTLES   Culture  Setup Time     Final   Value: 08/12/2014 13:42     Performed at [facility]   Culture     Final   Value:        BLOOD CULTURE RECEIVED NO GROWTH TO DATE CULTURE WILL BE HELD FOR 5 DAYS BEFORE ISSUING A FINAL NEGATIVE REPORT     Performed at [facility]   Report Status PENDING   Incomplete  CULTURE, EXPECTORATED SPUTUM-ASSESSMENT     Status: None   Collection Time    08/13/14  1:56 AM      Result Value Ref Range Status   Specimen Description SPUTUM   Final   Special Requests NONE   Final   Sputum evaluation     Final   Value: MICROSCOPIC FINDINGS SUGGEST THAT THIS SPECIMEN IS NOT REPRESENTATIVE OF LOWER RESPIRATORY SECRETIONS. PLEASE RECOLLECT.     Enid Derry AT 0216 ON 09.20.2015 BY NBROOKS   Report Status 08/13/2014 FINAL   Final  CULTURE, EXPECTORATED SPUTUM-ASSESSMENT     Status: None   Collection Time    08/13/14  6:47 AM      Result Value Ref Range Status   Specimen Description SPUTUM   Final   Special Requests NONE   Final   Sputum evaluation     Final   Value: THIS SPECIMEN IS  ACCEPTABLE. RESPIRATORY CULTURE REPORT TO FOLLOW.   Report Status 08/13/2014 FINAL   Final  CULTURE, RESPIRATORY (NON-EXPECTORATED)     Status: None   Collection Time    08/13/14  6:47 AM      Result Value Ref Range Status   Specimen Description SPUTUM   Final   Special Requests NONE   Final   Gram Stain PENDING   Incomplete   Culture     Final   Value: Culture reincubated for better growth     Performed at [facility]   Report Status PENDING   Incomplete    Renal Function:  Recent Labs  08/12/14 0431 08/13/14 0554 08/14/14 0437  CREATININE 0.91 1.14 0.80   Estimated Creatinine Clearance: 76 ml/min (by C-G formula based on Cr of 0.8).  Radiologic Imaging: Dg Chest 2 View  08/13/2014   CLINICAL DATA:  Shortness of breath.  EXAM: CHEST  2 VIEW  COMPARISON:  08/12/2014.  FINDINGS: Normal sized heart. No significant change in bilateral linear densities. Moderate sized hiatal hernia. This contained fat only on the CT dated 06/27/2010. Mild thoracic spine degenerative changes.  IMPRESSION: 1. No acute abnormality. 2. Stable bilateral scarring. 3. Moderate-sized hiatal hernia, previously shown to contain fat only.   Electronically Signed   By: Enrique Sack M.D.   On: 08/13/2014 14:25   Mr 3d Recon At Scanner  08/14/2014   CLINICAL DATA:  Dilated common bile duct on CT. Renal mass. Elevated liver function tests.  EXAM: MRI ABDOMEN WITHOUT AND WITH CONTRAST (INCLUDING MRCP)  TECHNIQUE: Multiplanar multisequence MR imaging of the  abdomen was performed both before and after the administration of intravenous contrast. Heavily T2-weighted images of the biliary and pancreatic ducts were obtained, and three-dimensional MRCP images were rendered by post processing.  CONTRAST:  54mL MULTIHANCE GADOBENATE DIMEGLUMINE 529 MG/ML IV SOLN  COMPARISON:  CT of 08/12/14.  FINDINGS: Lower chest: Normal heart size.  Trace bilateral pleural effusions.  Motion degradation throughout the abdomen.  Tiny  hiatal hernia.  Hepatobiliary: No focal liver lesion. Minimal intrahepatic biliary ductal dilatation. Normal gallbladder. Common duct mildly dilated, up to 12 mm on image 46 of series 4. Distal common duct filling defect at the level of the ampulla. 9 mm on image 37 of series 4. Image 88 of series 400 and image 27 of series 11. This appears more distally positioned than on the 08/12/2014 CT.  Spleen: Normal  Pancreas: Moderate peripancreatic edema, increased since 08/12/14. Example image 26 of series 3. No pancreatic ductal dilatation or mass. No peripancreatic fluid collection. Diffuse pancreatic enhancement, without evidence of necrosis.  Stomach/Bowel: Normal distal stomach and imaged abdominal bowel loops.  Adrenals/Urinary tract: Normal adrenal glands. 2 right renal lesions. An interpolar lesion measures 6 mm and is too small to entirely characterize. Likely a cyst. A lower pole right renal lesion measures 9 mm and is most consistent with a cyst.  Corresponding to the CT abnormality, 1.5 cm inter/lower pole left renal lesion demonstrates T2 hypo intensity on image 42 of series 3 and post-contrast enhancement, including on image 58 of series 13002. No hydronephrosis.  Vascular/Lymphatic: Atherosclerotic irregularity in the aorta and branch vessels. No retroperitoneal adenopathy. Left renal vein patent.  Musculoskeletal: No focal osseous abnormality.  Other:  No ascites.  IMPRESSION: 1. Mildly motion degraded exam. 2. Filling defect within the distal common duct, highly suspicious for periampullary stone. This is positioned more distally than on 08/12/14 CT. Mild secondary biliary ductal dilatation. 3. 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 4. Progressive moderate pancreatitis.   Electronically Signed   By: Abigail Miyamoto M.D.   On: 08/14/2014 10:21   Mr Jeananne Rama W/wo Cm/mrcp  08/14/2014   CLINICAL DATA:  Dilated common bile duct on CT. Renal mass.  Elevated liver function tests.  EXAM: MRI ABDOMEN WITHOUT AND WITH CONTRAST (INCLUDING MRCP)  TECHNIQUE: Multiplanar multisequence MR imaging of the abdomen was performed both before and after the administration of intravenous contrast. Heavily T2-weighted images of the biliary and pancreatic ducts were obtained, and three-dimensional MRCP images were rendered by post processing.  CONTRAST:  79mL MULTIHANCE GADOBENATE DIMEGLUMINE 529 MG/ML IV SOLN  COMPARISON:  CT of 08/12/14.  FINDINGS: Lower chest: Normal heart size.  Trace bilateral pleural effusions.  Motion degradation throughout the abdomen.  Tiny hiatal hernia.  Hepatobiliary: No focal liver lesion. Minimal intrahepatic biliary ductal dilatation. Normal gallbladder. Common duct mildly dilated, up to 12 mm on image 46 of series 4. Distal common duct filling defect at the level of the ampulla. 9 mm on image 37 of series 4. Image 88 of series 400 and image 27 of series 11. This appears more distally positioned than on the 08/12/2014 CT.  Spleen: Normal  Pancreas: Moderate peripancreatic edema, increased since 08/12/14. Example image 26 of series 3. No pancreatic ductal dilatation or mass. No peripancreatic fluid collection. Diffuse pancreatic enhancement, without evidence of necrosis.  Stomach/Bowel: Normal distal stomach and imaged abdominal bowel loops.  Adrenals/Urinary tract: Normal adrenal glands. 2 right renal lesions. An interpolar lesion measures 6 mm  and is too small to entirely characterize. Likely a cyst. A lower pole right renal lesion measures 9 mm and is most consistent with a cyst.  Corresponding to the CT abnormality, 1.5 cm inter/lower pole left renal lesion demonstrates T2 hypo intensity on image 42 of series 3 and post-contrast enhancement, including on image 58 of series 13002. No hydronephrosis.  Vascular/Lymphatic: Atherosclerotic irregularity in the aorta and branch vessels. No retroperitoneal adenopathy. Left renal vein patent.   Musculoskeletal: No focal osseous abnormality.  Other:  No ascites.  IMPRESSION: 1. Mildly motion degraded exam. 2. Filling defect within the distal common duct, highly suspicious for periampullary stone. This is positioned more distally than on 08/12/14 CT. Mild secondary biliary ductal dilatation. 3. 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 4. Progressive moderate pancreatitis.   Electronically Signed   By: Abigail Miyamoto M.D.   On: 08/14/2014 10:21     Impression/Recommendation  Left renal mass--concerning for RCC.  Pt's other medical issues of pancreatitis and pneumonitis are the primary concern at this time. Once he is stabilized from these issues he can f/u with Dr. Karsten Ro as an outpatient to discuss treatment options for his renal mass which include observation vs. ablation vs. surgical resection.    [place], [place] 08/14/2014, 2:40 PM

## 2014-08-14 NOTE — Consult Note (Signed)
Patient seen and examined personally. I agree with the above note. I reviewed the patient's notes, labs, MRCP and CT scans. We discussed the nature of small renal masses. We discussed these might be benign or malignant. We discussed the role of biopsy and the nature risk and benefits of surveillance, ablation, partial nephrectomy. Fortunately, these masses tend to grow very slowly and rarely metastasize based on several large studies of surveillance on renal masses. He will need followup with Dr. Karsten Ro in the outpatient to discuss management.

## 2014-08-14 NOTE — Progress Notes (Signed)
CSW received consult for COPD Gold Protocol, though patient does not meet qualifications (only 1 admission within the past 6 months).   CSW signing off.   Laymond Postle, LCSW [facility] Clinical Social Worker cell #: [phone]    

## 2014-08-14 NOTE — Progress Notes (Signed)
PROGRESS NOTE  Jim [name] ZWC:[id number] DOB: 1932/08/19 DOA: 08/12/2014 PCP: [name], MD  Interim summary 80 year old male with past medical history of hypertension, dyslipidemia, diabetes, chronic diastolic CHF, history of prostate cancer who presented to [facility] ED 08/12/2014 with worsening abdominal pain over past 24 hours prior to this admission. Patient explains pain is in the upper and mid abdomen associated with multiple episodes of nonbloody vomiting. In emergency department, the patient was noted to have a lipase >3000, AST 65, ALT 90, ALT 187, total bilirubin 6.5. BNP was 772, WBC 15.6.  CT abdomen showed focal midline retroperitoneal inflammation unclear if this is sequela of duodenitis or possible pancreatitis, possible mid to proximal small bowel wall thickening, non-obstructing sub-centimeters distal common bile duct lesion which could reflect polyp or less likely neoplasm, 16 mm solid left lower pole renal mass highly concerning for renal cell carcinoma. The patient was placed on bowel rest and started on intravenous fluids for his acute pancreatitis. Gastroenterology was consulted. MRCP was performed as well as MRI of the abdomen to clarify his left renal mass. The MRCP revealed a distal common bile duct filling defect concerning for a stone. MRI of the abdomen revealed a suspicious left renal lesion concerning for renal cell carcinoma. Urology has been consulted. Gastroenterology plans for ERCP on 08/15/2014. The patient continues on Unasyn for empiric treatment of aspiration pneumonitis.   Assessment/Plan: Acute on chronic respiratory failure  -Likely multifactorial including the patient's COPD and possible aspiration pneumonitis  -Patient is supposed to be on oxygen at home (2.5L), but only uses it when necessary  -Concerned about aspiration pneumonitis given the patient's intractable vomiting  -Repeat chest x-ray--bibasilar scarring, although question increase in  right lower lobe opacity -Continue aerosolized albuterol and Atrovent  -Restart long-acting beta agonist  Fever  -If they do to pancreatitis/cholangitis/aspiration pneumonitis -The patient had a temperature of 101.38F on 08/12/14 evening  -Broad antibiotic coverage to cover for aspiration pneumonitis-->unasyn  -UA neg for pyuria -Follow blood culture--negative to date  -WBC improving Acute pancreatitis  -may have contributed to fever  -concerned about biliary etiology due to CBD abnormality  -LFTs, lipase improving  -Appreciate GI followup  -MRCP--distant, bowel duct filling defect suspicious for periampullary stone -ERCP plans noted by GI -continue clear liquids  -check lipids--triglycerides 156  -08/12/2014 CT abdomen and pelvis reveals--Focal midline retroperitoneal inflammation, Nonobstructing subcentimeter distal Common bile duct lesion, 16 mm solid LEFT lower pole renal mass  Renal mass  -MRI of the kidneys--suspicious for RCC -consulted urology Chronic diastolic CHF  -Appears euvolemic  -continue carvedilol  COPD  -Stable  -Restart long-acting beta agonist  -Continue aerosolized albuterol and Atrovent  Hypertension  -Discontinue losartan as the patient's blood pressure soft  -Continue carvedilol  Diabetes mellitus type 2  -08/12/2014 hemoglobin A1c 7.0  -Discontinue metformin  -NovoLog sliding scale  Family Communication: Family updated at beside  Disposition Plan: Home when medically stable  Antibiotics:  Levofloxacin 9/19>>9/20  unasyn 08/13/14>>>       Procedures/Studies: Dg Chest 2 View  08/13/2014   CLINICAL DATA:  Shortness of breath.  EXAM: CHEST  2 VIEW  COMPARISON:  08/12/2014.  FINDINGS: Normal sized heart. No significant change in bilateral linear densities. Moderate sized hiatal hernia. This contained fat only on the CT dated 06/27/2010. Mild thoracic spine degenerative changes.  IMPRESSION: 1. No acute abnormality. 2. Stable bilateral scarring. 3.  Moderate-sized hiatal hernia, previously shown to contain fat only.  Electronically Signed   By: Enrique Sack M.D.   On: 08/13/2014 14:25   Dg Chest 2 View  08/12/2014   CLINICAL DATA:  Severe shortness of breath. Dyspnea. Abdominal pain and bloating.  EXAM: CHEST  2 VIEW  COMPARISON:  Chest radiograph performed 12/23/2010  FINDINGS: The lungs are well-aerated. Right mid lung scarring is noted. Minimal left basilar opacity may reflect atelectasis or possibly mild pneumonia, given the patient's symptoms. Mild vascular congestion is noted. There is no evidence of pleural effusion or pneumothorax.  The heart is normal in size; the mediastinal contour is within normal limits. No acute osseous abnormalities are seen.  IMPRESSION: Right mid lung scarring again noted; minimal left basilar airspace opacity may reflect atelectasis or possibly mild pneumonia, given the patient's symptoms. Mild vascular congestion noted.   Electronically Signed   By: Garald Balding M.D.   On: 08/12/2014 05:22   Ct Abdomen Pelvis W Contrast  08/12/2014   CLINICAL DATA:  Severe shortness of breath and severe abdominal pain.  EXAM: CT ABDOMEN AND PELVIS WITH CONTRAST  TECHNIQUE: Multidetector CT imaging of the abdomen and pelvis was performed using the standard protocol following bolus administration of intravenous contrast.  CONTRAST:  38mL OMNIPAQUE IOHEXOL 300 MG/ML SOLN, 163mL OMNIPAQUE IOHEXOL 300 MG/ML SOLN  COMPARISON:  Abdominal ultrasound November 10, 2013  FINDINGS: LUNG BASES: Limited view of the lung bases demonstrates severe centrilobular emphysema. Dependent atelectasis. Heart size is normal. Coronary artery calcifications. Included pericardium is unremarkable.  SOLID ORGANS: The liver, spleen, gallbladder, and adrenal glands are unremarkable. Focal midline retroperitoneal inflammation, contiguous with the inferior margin of the pancreas and, ligaments of [place].  GASTROINTESTINAL TRACT: Small hiatal hernia, with under amount  of surrounding mildly inflamed fat. The stomach, small and large bowel are normal in course and caliber without inflammatory changes. Suspected proximal small bowel wall thickening. Mild colonic diverticulosis. Normal appendix.  KIDNEYS/ URINARY TRACT: Kidneys are orthotopic, demonstrating symmetric enhancement. Solid enhancing LEFT lower pole 16 mm mass with mild washout on delayed phase. No nephrolithiasis, hydronephrosis. 10 mm RIGHT lower pole cysts. Too small to characterize RIGHT interpolar hypodensity. The unopacified ureters are normal in course and caliber. Delayed imaging through the kidneys demonstrates symmetric prompt contrast excretion within the proximal urinary collecting system. Urinary bladder is partially distended and unremarkable.  PERITONEUM/RETROPERITONEUM: The Common bile duct is not enlarged, however there is a 5 mm soft tissue lesion along the posterior wall of the distal Common bile duct, 51/101 and 50/119. No intraperitoneal free fluid nor free air. Aortoiliac vessels are normal in course and caliber, moderate calcific atherosclerosis. No lymphadenopathy by CT size criteria. Prostate brachytherapy seeds. Partially imaged small LEFT hydrocele.  SOFT TISSUE/OSSEOUS STRUCTURES: Nonsuspicious. Grade 1 L4-5 anterolisthesis on spondylotic basis.  IMPRESSION: Focal midline retroperitoneal inflammation, unclear if this is reflects sequelae duodenitis or possible pancreatitis. Suspected mild proximal small bowel wall thickening.  Nonobstructing subcentimeter distal Common bile duct lesion, which could reflect polyp or less likely neoplasm, possible sludge ball. Recommend MRCP on a nonemergent basis.  Small hiatal hernia with moderate amount of associated mildly inflamed fat, which is possibly tracking from the retroperitoneum.  16 mm solid LEFT lower pole renal mass highly concerning for renal cell carcinoma. Recommend further characterization with MRI, biopsy or surgery. This recommendation  follows ACR consensus guidelines: Managing Incidental Findings on Abdominal CT: White Paper of the ACR Incidental Findings Committee. Natasha [facility] [phone]   Electronically Signed   By: Elon Alas  On: 08/12/2014 06:58   Mr 3d Recon At Scanner  08/14/2014   CLINICAL DATA:  Dilated common bile duct on CT. Renal mass. Elevated liver function tests.  EXAM: MRI ABDOMEN WITHOUT AND WITH CONTRAST (INCLUDING MRCP)  TECHNIQUE: Multiplanar multisequence MR imaging of the abdomen was performed both before and after the administration of intravenous contrast. Heavily T2-weighted images of the biliary and pancreatic ducts were obtained, and three-dimensional MRCP images were rendered by post processing.  CONTRAST:  62mL MULTIHANCE GADOBENATE DIMEGLUMINE 529 MG/ML IV SOLN  COMPARISON:  CT of 08/12/14.  FINDINGS: Lower chest: Normal heart size.  Trace bilateral pleural effusions.  Motion degradation throughout the abdomen.  Tiny hiatal hernia.  Hepatobiliary: No focal liver lesion. Minimal intrahepatic biliary ductal dilatation. Normal gallbladder. Common duct mildly dilated, up to 12 mm on image 46 of series 4. Distal common duct filling defect at the level of the ampulla. 9 mm on image 37 of series 4. Image 88 of series 400 and image 27 of series 11. This appears more distally positioned than on the 08/12/2014 CT.  Spleen: Normal  Pancreas: Moderate peripancreatic edema, increased since 08/12/14. Example image 26 of series 3. No pancreatic ductal dilatation or mass. No peripancreatic fluid collection. Diffuse pancreatic enhancement, without evidence of necrosis.  Stomach/Bowel: Normal distal stomach and imaged abdominal bowel loops.  Adrenals/Urinary tract: Normal adrenal glands. 2 right renal lesions. An interpolar lesion measures 6 mm and is too small to entirely characterize. Likely a cyst. A lower pole right renal lesion measures 9 mm and is most consistent with a cyst.  Corresponding to the CT  abnormality, 1.5 cm inter/lower pole left renal lesion demonstrates T2 hypo intensity on image 42 of series 3 and post-contrast enhancement, including on image 58 of series 13002. No hydronephrosis.  Vascular/Lymphatic: Atherosclerotic irregularity in the aorta and branch vessels. No retroperitoneal adenopathy. Left renal vein patent.  Musculoskeletal: No focal osseous abnormality.  Other:  No ascites.  IMPRESSION: 1. Mildly motion degraded exam. 2. Filling defect within the distal common duct, highly suspicious for periampullary stone. This is positioned more distally than on 08/12/14 CT. Mild secondary biliary ductal dilatation. 3. 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 4. Progressive moderate pancreatitis.   Electronically Signed   By: Abigail Miyamoto M.D.   On: 08/14/2014 10:21   Mr Jeananne Rama W/wo Cm/mrcp  08/14/2014   CLINICAL DATA:  Dilated common bile duct on CT. Renal mass. Elevated liver function tests.  EXAM: MRI ABDOMEN WITHOUT AND WITH CONTRAST (INCLUDING MRCP)  TECHNIQUE: Multiplanar multisequence MR imaging of the abdomen was performed both before and after the administration of intravenous contrast. Heavily T2-weighted images of the biliary and pancreatic ducts were obtained, and three-dimensional MRCP images were rendered by post processing.  CONTRAST:  45mL MULTIHANCE GADOBENATE DIMEGLUMINE 529 MG/ML IV SOLN  COMPARISON:  CT of 08/12/14.  FINDINGS: Lower chest: Normal heart size.  Trace bilateral pleural effusions.  Motion degradation throughout the abdomen.  Tiny hiatal hernia.  Hepatobiliary: No focal liver lesion. Minimal intrahepatic biliary ductal dilatation. Normal gallbladder. Common duct mildly dilated, up to 12 mm on image 46 of series 4. Distal common duct filling defect at the level of the ampulla. 9 mm on image 37 of series 4. Image 88 of series 400 and image 27 of series 11. This appears more distally positioned than on the  08/12/2014 CT.  Spleen: Normal  Pancreas: Moderate peripancreatic edema, increased since 08/12/14. Example image  26 of series 3. No pancreatic ductal dilatation or mass. No peripancreatic fluid collection. Diffuse pancreatic enhancement, without evidence of necrosis.  Stomach/Bowel: Normal distal stomach and imaged abdominal bowel loops.  Adrenals/Urinary tract: Normal adrenal glands. 2 right renal lesions. An interpolar lesion measures 6 mm and is too small to entirely characterize. Likely a cyst. A lower pole right renal lesion measures 9 mm and is most consistent with a cyst.  Corresponding to the CT abnormality, 1.5 cm inter/lower pole left renal lesion demonstrates T2 hypo intensity on image 42 of series 3 and post-contrast enhancement, including on image 58 of series 13002. No hydronephrosis.  Vascular/Lymphatic: Atherosclerotic irregularity in the aorta and branch vessels. No retroperitoneal adenopathy. Left renal vein patent.  Musculoskeletal: No focal osseous abnormality.  Other:  No ascites.  IMPRESSION: 1. Mildly motion degraded exam. 2. Filling defect within the distal common duct, highly suspicious for periampullary stone. This is positioned more distally than on 08/12/14 CT. Mild secondary biliary ductal dilatation. 3. 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 4. Progressive moderate pancreatitis.   Electronically Signed   By: Abigail Miyamoto M.D.   On: 08/14/2014 10:21         Subjective: Patient denies fevers, chills, headache, chest pain, dyspnea, nausea, vomiting, diarrhea, abdominal pain, dysuria, hematuria   Objective: Filed Vitals:   08/13/14 1550 08/13/14 2102 08/14/14 0500 08/14/14 0929  BP:  147/67 143/65   Pulse:  73 72   Temp:  98.3 F (36.8 C) 98.4 F (36.9 C)   TempSrc:  Oral Oral   Resp:  18 18   Height:      Weight:   78.11 kg (172 lb 3.2 oz)   SpO2: 96% 93% 96% 94%    Intake/Output Summary (Last 24  hours) at 08/14/14 1310 Last data filed at 08/14/14 1229  Gross per 24 hour  Intake   3230 ml  Output    500 ml  Net   2730 ml   Weight change: -1.587 kg (-3 lb 8 oz) Exam:   General:  Pt is alert, follows commands appropriately, not in acute distress  HEENT: No icterus, No thrush,  [place]/AT  Cardiovascular: RRR, S1/S2, no rubs, no gallops  Respiratory: Diminished breath sounds, but CTA bilaterally, no wheezing, no crackles, no rhonchi  Abdomen: Soft/+BS, non tender, non distended, no guarding  Extremities: No edema, No lymphangitis, No petechiae, No rashes, no synovitis  Data Reviewed: Basic Metabolic Panel:  Recent Labs Lab 08/12/14 0431 08/13/14 0554 08/14/14 0437  NA 136* 134* 136*  K 4.0 3.8 3.8  CL 93* 99 101  CO2 24 23 21   GLUCOSE 200* 149* 158*  BUN 16 21 14   CREATININE 0.91 1.14 0.80  CALCIUM 9.3 8.2* 8.2*   Liver Function Tests:  Recent Labs Lab 08/12/14 0431 08/13/14 0554 08/14/14 0437  AST 65* 29 22  ALT 90* 44 33  ALKPHOS 187* 123* 123*  BILITOT 6.5* 2.4* 2.1*  PROT 7.8 5.9* 6.0  ALBUMIN 4.0 2.7* 2.6*    Recent Labs Lab 08/12/14 0431 08/13/14 0554 08/14/14 0437  LIPASE >3000* 255* 113*   No results found for this basename: AMMONIA,  in the last 168 hours CBC:  Recent Labs Lab 08/12/14 0431 08/13/14 0554 08/14/14 0437  WBC 15.6* 11.0* 10.1  NEUTROABS 13.9*  --   --   HGB 14.0 11.5* 11.6*  HCT 41.1 34.3* 34.7*  MCV 93.4 95.3 94.6  PLT 206 124* 120*  Cardiac Enzymes: No results found for this basename: CKTOTAL, CKMB, CKMBINDEX, TROPONINI,  in the last 168 hours BNP: No components found with this basename: POCBNP,  CBG:  Recent Labs Lab 08/13/14 0755 08/13/14 1724 08/13/14 2104 08/14/14 0742 08/14/14 1140  GLUCAP 125* 121* 152* 170* 155*    Recent Results (from the past 240 hour(s))  CULTURE, BLOOD (ROUTINE X 2)     Status: None   Collection Time    08/12/14  9:19 AM      Result Value Ref Range Status   Specimen  Description BLOOD RIGHT ARM   Final   Special Requests     Final   Value: BOTTLES DRAWN AEROBIC AND ANAEROBIC 10CC BLUE BOTTLE, 7 CC RED BOTTLE   Culture  Setup Time     Final   Value: 08/12/2014 13:42     Performed at [facility]   Culture     Final   Value:        BLOOD CULTURE RECEIVED NO GROWTH TO DATE CULTURE WILL BE HELD FOR 5 DAYS BEFORE ISSUING A FINAL NEGATIVE REPORT     Performed at [facility]   Report Status PENDING   Incomplete  CULTURE, BLOOD (ROUTINE X 2)     Status: None   Collection Time    08/12/14  9:24 AM      Result Value Ref Range Status   Specimen Description BLOOD RIGHT ARM   Final   Special Requests     Final   Value: BOTTLES DRAWN AEROBIC AND ANAEROBIC 10CC BOTH BOTTLES   Culture  Setup Time     Final   Value: 08/12/2014 13:42     Performed at [facility]   Culture     Final   Value:        BLOOD CULTURE RECEIVED NO GROWTH TO DATE CULTURE WILL BE HELD FOR 5 DAYS BEFORE ISSUING A FINAL NEGATIVE REPORT     Performed at [facility]   Report Status PENDING   Incomplete  CULTURE, EXPECTORATED SPUTUM-ASSESSMENT     Status: None   Collection Time    08/13/14  1:56 AM      Result Value Ref Range Status   Specimen Description SPUTUM   Final   Special Requests NONE   Final   Sputum evaluation     Final   Value: MICROSCOPIC FINDINGS SUGGEST THAT THIS SPECIMEN IS NOT REPRESENTATIVE OF LOWER RESPIRATORY SECRETIONS. PLEASE RECOLLECT.     Enid Derry AT 0216 ON 09.20.2015 BY NBROOKS   Report Status 08/13/2014 FINAL   Final  CULTURE, EXPECTORATED SPUTUM-ASSESSMENT     Status: None   Collection Time    08/13/14  6:47 AM      Result Value Ref Range Status   Specimen Description SPUTUM   Final   Special Requests NONE   Final   Sputum evaluation     Final   Value: THIS SPECIMEN IS ACCEPTABLE. RESPIRATORY CULTURE REPORT TO FOLLOW.   Report Status 08/13/2014 FINAL   Final  CULTURE, RESPIRATORY (NON-EXPECTORATED)     Status: None    Collection Time    08/13/14  6:47 AM      Result Value Ref Range Status   Specimen Description SPUTUM   Final   Special Requests NONE   Final   Gram Stain PENDING   Incomplete   Culture     Final   Value: Culture reincubated for better growth     Performed at  [facility]   Report Status PENDING   Incomplete     Scheduled Meds: . ampicillin-sulbactam (UNASYN) IV  1.5 g Intravenous 4 times per day  . aspirin EC  81 mg Oral Daily  . atorvastatin  20 mg Oral q1800  . budesonide-formoterol  2 puff Inhalation BID  . carvedilol  25 mg Oral BID WC  . enoxaparin (LOVENOX) injection  40 mg Subcutaneous Q24H  . guaiFENesin  600 mg Oral Daily  . insulin aspart  0-9 Units Subcutaneous TID WC  . ipratropium-albuterol  3 mL Nebulization Q6H WA  . sodium chloride  3 mL Intravenous Q12H   Continuous Infusions: . sodium chloride 100 mL/hr at 08/14/14 0359     Kymia Simi, DO  [facility] Pager [phone]  If 7PM-7AM, please contact night-coverage [email or web address] Password [email or web address] 08/14/2014, 1:10 PM   LOS: 2 days

## 2014-08-14 NOTE — Progress Notes (Signed)
Physical Therapy Treatment Patient Details Name: EIN RIJO MRN: [id number] DOB: [birth year] Today's Date: 08/14/2014    History of Present Illness Pt is an 79 year old male admitted with abdominal pain, shortness of breath--pancreatitis and 16 mm solid left lower pole renal mass ? CA . PMHx:HTN, DM, CHF, prostrate CA.  Plans per chart for ERCP on 9/23 at 9 am    PT Comments    Pt mobilizing well however SpO2 86-92% during ambulation on 2L O2 [place] and pt reports not wearing at home unless he feels oxygen is low (does not check with pulse ox).  Pt mobilizing well however hesitant to d/c from acute care PT due to pending ERCP on 9/23, so would like to check back after surgery to make sure pt still mobilizing well.  Continue to recommend HHPT at this time.   Follow Up Recommendations  Home health PT     Equipment Recommendations  None recommended by PT    Recommendations for Other Services       Precautions / Restrictions Precautions Precaution Comments: O2 drops with ambulation--pt has O2 at home, but says he only uses it when he feels he needs it. Restrictions Weight Bearing Restrictions: No    Mobility  Bed Mobility Overal bed mobility: Independent                Transfers Overall transfer level: Modified independent Equipment used: None Transfers: Sit to/from Stand           General transfer comment: ambulated around the whole unit on 2 liters of O2 without issues  Ambulation/Gait Ambulation/Gait assistance: Modified independent (Device/Increase time) Ambulation Distance (Feet): 400 Feet Assistive device: None Gait Pattern/deviations: WFL(Within Functional Limits)     General Gait Details: steady without AD, no LOB, SaO2 86% - 92% on 2L O2; pt denies dyspnea   Stairs            Wheelchair Mobility    Modified Rankin (Stroke Patients Only)       Balance                                    Cognition Arousal/Alertness:  Awake/alert Behavior During Therapy: WFL for tasks assessed/performed Overall Cognitive Status:  (He reports his breathing is feels normal to him even though he continues to drop into the mid 80's on RA)                      Exercises      General Comments        Pertinent Vitals/Pain Pain Assessment: No/denies pain    Home Living                      Prior Function            PT Goals (current goals can now be found in the care plan section) Progress towards PT goals: Progressing toward goals    Frequency  Min 3X/week    PT Plan Current plan remains appropriate    Co-evaluation             End of Session Equipment Utilized During Treatment: Oxygen Activity Tolerance: Patient tolerated treatment well Patient left: in chair;with call bell/phone within reach     Time: 1545-1555 PT Time Calculation (min): 10 min  Charges:  $Gait Training: 8-22 mins  G CodesTrena Platt 09-11-2014, 4:11 PM Carmelia Bake, PT, DPT 09/11/14 Pager: [phone]

## 2014-08-14 NOTE — Progress Notes (Signed)
Occupational Therapy Treatment Patient Details Name: Jim [name] MRN: [id number] DOB: [birth year] Today's Date: 08/14/2014    History of present illness Admiited with abdominal pain, shortness of breath--pancreatitis and 16 mm solid left lower pole renal mass ? CA . PMHx:HTN, DM, CHF, prostrate CA   OT comments  This patient seen with main focus on BADLs and pt at a Mod I level for activities listed under ADL section (which would carry over to same levels for those not covered in this section). Last thing to cover in more detail with patient would be energy conservation (in addition to purse lipped breathing).  Follow Up Recommendations  No OT follow up    Equipment Recommendations  Tub/shower seat       Precautions / Restrictions Precautions Precaution Comments: O2 drops with ambulation--pt has O2 at home, but says he only uses it when he feels he needs it. Restrictions Weight Bearing Restrictions: No       Mobility Bed Mobility Overal bed mobility: Independent                Transfers Overall transfer level: Modified independent Equipment used: None Transfers: Sit to/from Stand           General transfer comment: ambulated around the whole unit on 2 liters of O2 without issues        ADL                       Lower Body Dressing: Modified independent   Toilet Transfer: Modified Independent   Toileting- Clothing Manipulation and Hygiene: Modified independent                          Cognition     Overall Cognitive Status:  (He reports his breathing is feels normal to him even though he continues to drop into the mid 80's on RA)                                    Pertinent Vitals/ Pain       Pain Assessment: No/denies pain         Frequency Min 2X/week     Progress Toward Goals  OT Goals(current goals can now be found in the care plan section)  Progress towards OT goals: Progressing toward goals (Overall  pt is a Mod I level, just need to reinforce energy conservation with him)     Plan Discharge plan remains appropriate       End of Session Equipment Utilized During Treatment: Oxygen (2 liters)   Activity Tolerance Patient tolerated treatment well   Patient Left in bed;with call bell/phone within reach   Nurse Communication  (Needs enough O2 tubing to reach to bathroom, I have asked nursing secretary to call RT to get this for him (since supplies not on floor))        Time: 1346-1409 OT Time Calculation (min): 23 min  Charges: OT General Charges $OT Visit: 1 Procedure OT Treatments $Self Care/Home Management : 23-37 mins  Jim [name] [phone] 08/14/2014, 2:21 PM

## 2014-08-14 NOTE — Progress Notes (Signed)
[facility] Progress Note  Subjective:  Objective:  Vital signs in last 24 hours: Temp:  [98.3 F (36.8 C)-99.8 F (37.7 C)] 98.4 F (36.9 C) (09/21 0500) Pulse Rate:  [72-80] 72 (09/21 0500) Resp:  [18] 18 (09/21 0500) BP: (131-147)/(65-69) 143/65 mmHg (09/21 0500) SpO2:  [93 %-99 %] 94 % (09/21 0929) Weight:  [172 lb 3.2 oz (78.11 kg)] 172 lb 3.2 oz (78.11 kg) (09/21 0500) Last BM Date: 08/13/14 General:   Alert, Well-developed, in NAD Heart:  Regular rate and rhythm; no murmurs Pulm: Abdomen:  Soft, nontender and nondistended. Normal bowel sounds, without guarding, and without rebound.   Extremities:  Without edema. Neurologic:  Alert and  oriented x4;  grossly normal neurologically. Psych:  Alert and cooperative. Normal mood and affect.  Intake/Output from previous day: 09/20 0701 - 09/21 0700 In: 3880 [P.O.:1320; I.V.:2360; IV Piggyback:200] Out: 1150 [Urine:1150]  Lab Results:  Recent Labs  08/12/14 0431 08/13/14 0554 08/14/14 0437  WBC 15.6* 11.0* 10.1  HGB 14.0 11.5* 11.6*  HCT 41.1 34.3* 34.7*  PLT 206 124* 120*   BMET  Recent Labs  08/12/14 0431 08/13/14 0554 08/14/14 0437  NA 136* 134* 136*  K 4.0 3.8 3.8  CL 93* 99 101  CO2 24 23 21   GLUCOSE 200* 149* 158*  BUN 16 21 14   CREATININE 0.91 1.14 0.80  CALCIUM 9.3 8.2* 8.2*   LFT  Recent Labs  08/14/14 0437  PROT 6.0  ALBUMIN 2.6*  AST 22  ALT 33  ALKPHOS 123*  BILITOT 2.1*   Dg Chest 2 View  08/13/2014   CLINICAL DATA:  Shortness of breath.  EXAM: CHEST  2 VIEW  COMPARISON:  08/12/2014.  FINDINGS: Normal sized heart. No significant change in bilateral linear densities. Moderate sized hiatal hernia. This contained fat only on the CT dated 06/27/2010. Mild thoracic spine degenerative changes.  IMPRESSION: 1. No acute abnormality. 2. Stable bilateral scarring. 3. Moderate-sized hiatal hernia, previously shown to contain fat only.   Electronically Signed   By: Enrique Sack  M.D.   On: 08/13/2014 14:25   Mr 3d Recon At Scanner  08/14/2014   CLINICAL DATA:  Dilated common bile duct on CT. Renal mass. Elevated liver function tests.  EXAM: MRI ABDOMEN WITHOUT AND WITH CONTRAST (INCLUDING MRCP)  TECHNIQUE: Multiplanar multisequence MR imaging of the abdomen was performed both before and after the administration of intravenous contrast. Heavily T2-weighted images of the biliary and pancreatic ducts were obtained, and three-dimensional MRCP images were rendered by post processing.  CONTRAST:  43mL MULTIHANCE GADOBENATE DIMEGLUMINE 529 MG/ML IV SOLN  COMPARISON:  CT of 08/12/14.  FINDINGS: Lower chest: Normal heart size.  Trace bilateral pleural effusions.  Motion degradation throughout the abdomen.  Tiny hiatal hernia.  Hepatobiliary: No focal liver lesion. Minimal intrahepatic biliary ductal dilatation. Normal gallbladder. Common duct mildly dilated, up to 12 mm on image 46 of series 4. Distal common duct filling defect at the level of the ampulla. 9 mm on image 37 of series 4. Image 88 of series 400 and image 27 of series 11. This appears more distally positioned than on the 08/12/2014 CT.  Spleen: Normal  Pancreas: Moderate peripancreatic edema, increased since 08/12/14. Example image 26 of series 3. No pancreatic ductal dilatation or mass. No peripancreatic fluid collection. Diffuse pancreatic enhancement, without evidence of necrosis.  Stomach/Bowel: Normal distal stomach and imaged abdominal bowel loops.  Adrenals/Urinary tract: Normal adrenal glands. 2 right renal lesions. An interpolar  lesion measures 6 mm and is too small to entirely characterize. Likely a cyst. A lower pole right renal lesion measures 9 mm and is most consistent with a cyst.  Corresponding to the CT abnormality, 1.5 cm inter/lower pole left renal lesion demonstrates T2 hypo intensity on image 42 of series 3 and post-contrast enhancement, including on image 58 of series 13002. No hydronephrosis.   Vascular/Lymphatic: Atherosclerotic irregularity in the aorta and branch vessels. No retroperitoneal adenopathy. Left renal vein patent.  Musculoskeletal: No focal osseous abnormality.  Other:  No ascites.  IMPRESSION: 1. Mildly motion degraded exam. 2. Filling defect within the distal common duct, highly suspicious for periampullary stone. This is positioned more distally than on 08/12/14 CT. Mild secondary biliary ductal dilatation. 3. 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 4. Progressive moderate pancreatitis.   Electronically Signed   By: Abigail Miyamoto M.D.   On: 08/14/2014 10:21   Mr Jeananne Rama W/wo Cm/mrcp  08/14/2014   CLINICAL DATA:  Dilated common bile duct on CT. Renal mass. Elevated liver function tests.  EXAM: MRI ABDOMEN WITHOUT AND WITH CONTRAST (INCLUDING MRCP)  TECHNIQUE: Multiplanar multisequence MR imaging of the abdomen was performed both before and after the administration of intravenous contrast. Heavily T2-weighted images of the biliary and pancreatic ducts were obtained, and three-dimensional MRCP images were rendered by post processing.  CONTRAST:  48mL MULTIHANCE GADOBENATE DIMEGLUMINE 529 MG/ML IV SOLN  COMPARISON:  CT of 08/12/14.  FINDINGS: Lower chest: Normal heart size.  Trace bilateral pleural effusions.  Motion degradation throughout the abdomen.  Tiny hiatal hernia.  Hepatobiliary: No focal liver lesion. Minimal intrahepatic biliary ductal dilatation. Normal gallbladder. Common duct mildly dilated, up to 12 mm on image 46 of series 4. Distal common duct filling defect at the level of the ampulla. 9 mm on image 37 of series 4. Image 88 of series 400 and image 27 of series 11. This appears more distally positioned than on the 08/12/2014 CT.  Spleen: Normal  Pancreas: Moderate peripancreatic edema, increased since 08/12/14. Example image 26 of series 3. No pancreatic ductal dilatation or mass. No peripancreatic fluid collection.  Diffuse pancreatic enhancement, without evidence of necrosis.  Stomach/Bowel: Normal distal stomach and imaged abdominal bowel loops.  Adrenals/Urinary tract: Normal adrenal glands. 2 right renal lesions. An interpolar lesion measures 6 mm and is too small to entirely characterize. Likely a cyst. A lower pole right renal lesion measures 9 mm and is most consistent with a cyst.  Corresponding to the CT abnormality, 1.5 cm inter/lower pole left renal lesion demonstrates T2 hypo intensity on image 42 of series 3 and post-contrast enhancement, including on image 58 of series 13002. No hydronephrosis.  Vascular/Lymphatic: Atherosclerotic irregularity in the aorta and branch vessels. No retroperitoneal adenopathy. Left renal vein patent.  Musculoskeletal: No focal osseous abnormality.  Other:  No ascites.  IMPRESSION: 1. Mildly motion degraded exam. 2. Filling defect within the distal common duct, highly suspicious for periampullary stone. This is positioned more distally than on 08/12/14 CT. Mild secondary biliary ductal dilatation. 3. 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 4. Progressive moderate pancreatitis.   Electronically Signed   By: Abigail Miyamoto M.D.   On: 08/14/2014 10:21    Assessment / Plan: #1 80 yo male with acute biliary pancreatitis- stable and improving- MRCP showed filling defect in distal CBD highly suspicious for periampullary stone. Clear liquid diet today   #  2 renal mass noted on CT concerning for renal cell CA #3 COPD  #4 AODM   -Will plan for ERCP on 9/23 at 9 am.  Already on Unasyn.    LOS: 2 days   ZEHR, JESSICA D.  08/14/2014, 10:48 AM  Pager number [phone]   ________________________________________________________________________  Velora Heckler GI MD note:  I personally examined the patient, reviewed the data and agree with the assessment and plan described above.  Appears to have stone in distal CBD, likely the  cause of his recent AP.  Clnically improved.  Should continue clears for now, planning on ERCP Wednesday morning.  I spoke with patient, son, daughter in law? This afternoon about risks, benefits (including pancreatitis, perforation, bleeding).   Owens Loffler, MD [facility] Pager [phone]

## 2014-08-15 LAB — CULTURE, RESPIRATORY W GRAM STAIN: Culture: NORMAL

## 2014-08-15 LAB — GLUCOSE, CAPILLARY
GLUCOSE-CAPILLARY: 138 mg/dL — AB (ref 70–99)
Glucose-Capillary: 147 mg/dL — ABNORMAL HIGH (ref 70–99)
Glucose-Capillary: 120 mg/dL — ABNORMAL HIGH (ref 70–99)
Glucose-Capillary: 119 mg/dL — ABNORMAL HIGH (ref 70–99)

## 2014-08-15 LAB — CULTURE, RESPIRATORY

## 2014-08-15 LAB — LIPASE, BLOOD: Lipase: 68 U/L — ABNORMAL HIGH (ref 11–59)

## 2014-08-15 MED ORDER — IPRATROPIUM-ALBUTEROL 0.5-2.5 (3) MG/3ML IN SOLN
3.0000 mL | RESPIRATORY_TRACT | Status: DC | PRN
  Administered 2014-08-18: 3 mL via RESPIRATORY_TRACT
  Filled 2014-08-15: qty 3

## 2014-08-15 MED ORDER — IPRATROPIUM-ALBUTEROL 0.5-2.5 (3) MG/3ML IN SOLN
3.0000 mL | Freq: Four times a day (QID) | RESPIRATORY_TRACT | Status: DC
  Administered 2014-08-15 – 2014-08-21 (×24): 3 mL via RESPIRATORY_TRACT
  Filled 2014-08-15 (×25): qty 3

## 2014-08-15 MED ORDER — INDOMETHACIN 50 MG RE SUPP
50.0000 mg | RECTAL | Status: AC
  Administered 2014-08-16: 50 mg via RECTAL
  Filled 2014-08-15 (×2): qty 1

## 2014-08-15 MED ORDER — ENOXAPARIN SODIUM 40 MG/0.4ML ~~LOC~~ SOLN: 40.0000 mg | SUBCUTANEOUS | Status: DC

## 2014-08-15 MED ORDER — LOSARTAN POTASSIUM 50 MG PO TABS
50.0000 mg | ORAL_TABLET | Freq: Every day | ORAL | Status: DC
  Administered 2014-08-15 – 2014-08-23 (×8): 50 mg via ORAL
  Filled 2014-08-15 (×11): qty 1

## 2014-08-15 MED ORDER — INDOMETHACIN 50 MG RE SUPP: 50.0000 mg | RECTAL | Status: DC

## 2014-08-15 MED ORDER — INDOMETHACIN 50 MG RE SUPP
50.0000 mg | RECTAL | Status: AC
  Administered 2014-08-16: 50 mg via RECTAL
  Filled 2014-08-15 (×5): qty 1

## 2014-08-15 NOTE — Progress Notes (Signed)
Occupational Therapy Treatment Patient Details Name: Jim [name] MRN: [id number] DOB: 08-09-1932 Today's Date: 08/15/2014    History of present illness Pt is an 80 year old male admitted with abdominal pain, shortness of breath--pancreatitis and 16 mm solid left lower pole renal mass ? CA . PMHx:HTN, DM, CHF, prostrate CA.  Plans per chart for ERCP on 9/23 at 9 am   OT comments  Educated pt on energy conservation techniques and issued handout and reviewed information on this topic. Demonstrated purse lip breathing technique and reinforced taking rest breaks as needed. Pt would like a tubseat if covered by insurance. Will plan to check on pt after procedure tomorrow and make sure no other OT needs but feel he likely can d/c from OT after that.   Follow Up Recommendations  No OT follow up    Equipment Recommendations  Tub/shower seat (only if covered by insurance.)    Recommendations for Other Services      Precautions / Restrictions Precautions Precaution Comments: O2 drops with ambulation--pt has O2 at home, but says he only uses it when he feels he needs it.       Mobility Bed Mobility                  Transfers                      Balance                                   ADL                                         General ADL Comments: Issued energy conservation handout and reviewed all techniques with pt. Also demonstrated purse lip breathing and pt verbalized understanding of how to perform and when to do. Discussed at length benefit of tubseat option and pt only interested if covered by insurance. He declines the need to practice tub transfer and states he will be fine with this. He declines needing a 3in1 at this time. Pt for ERCP tomorrow 9/23 and feel one more visit to make sure he feels comfortable with all ADL post procedure would be helpful.       Vision                     Perception     Praxis       Cognition                             Extremity/Trunk Assessment               Exercises     Shoulder Instructions       General Comments      Pertinent Vitals/ Pain          Home Living                                          Prior Functioning/Environment              Frequency Min 2X/week     Progress Toward Goals  OT Goals(current goals can now  be found in the care plan section)  Progress towards OT goals: Progressing toward goals     Plan      Co-evaluation                 End of Session     Activity Tolerance     Patient Left in chair;with call bell/phone within reach   Nurse Communication          Time: 5170-0174 OT Time Calculation (min): 11 min  Charges: OT General Charges $OT Visit: 1 Procedure OT Treatments $Self Care/Home Management : 8-22 mins  Jules Schick [phone] 08/15/2014, 12:11 PM

## 2014-08-15 NOTE — Progress Notes (Signed)
[facility] Progress Note  Subjective:  Feels good.  No abdominal pain.    Objective:  Vital signs in last 24 hours: Temp:  [98.2 F (36.8 C)-98.4 F (36.9 C)] 98.2 F (36.8 C) (09/22 0534) Pulse Rate:  [69-87] 86 (09/22 0534) Resp:  [18] 18 (09/22 0534) BP: (142-153)/(67-81) 142/67 mmHg (09/22 0534) SpO2:  [92 %-98 %] 98 % (09/22 0534) Weight:  [172 lb 2.2 oz (78.08 kg)] 172 lb 2.2 oz (78.08 kg) (09/22 0534) Last BM Date: 08/13/14 General:  Alert, Well-developed, in NAD Heart:  Regular rate and rhythm; no murmurs Pulm:  Decreased BS B/L. Abdomen:  Soft, non-distended. Normal bowel sounds.  Non-tender. Extremities:  Without edema. Neurologic:  Alert and  oriented x4;  grossly normal neurologically. Psych:  Alert and cooperative. Normal mood and affect.  Intake/Output from previous day: 09/21 0701 - 09/22 0700 In: 2780 [P.O.:240; I.V.:2340; IV Piggyback:200] Out: -   Lab Results:  Recent Labs  08/13/14 0554 08/14/14 0437  WBC 11.0* 10.1  HGB 11.5* 11.6*  HCT 34.3* 34.7*  PLT 124* 120*   BMET  Recent Labs  08/13/14 0554 08/14/14 0437  NA 134* 136*  K 3.8 3.8  CL 99 101  CO2 23 21  GLUCOSE 149* 158*  BUN 21 14  CREATININE 1.14 0.80  CALCIUM 8.2* 8.2*   LFT  Recent Labs  08/14/14 0437  PROT 6.0  ALBUMIN 2.6*  AST 22  ALT 33  ALKPHOS 123*  BILITOT 2.1*   Dg Chest 2 View  08/13/2014   CLINICAL DATA:  Shortness of breath.  EXAM: CHEST  2 VIEW  COMPARISON:  08/12/2014.  FINDINGS: Normal sized heart. No significant change in bilateral linear densities. Moderate sized hiatal hernia. This contained fat only on the CT dated 06/27/2010. Mild thoracic spine degenerative changes.  IMPRESSION: 1. No acute abnormality. 2. Stable bilateral scarring. 3. Moderate-sized hiatal hernia, previously shown to contain fat only.   Electronically Signed   By: Enrique Sack M.D.   On: 08/13/2014 14:25   Mr 3d Recon At Scanner  08/14/2014   CLINICAL DATA:   Dilated common bile duct on CT. Renal mass. Elevated liver function tests.  EXAM: MRI ABDOMEN WITHOUT AND WITH CONTRAST (INCLUDING MRCP)  TECHNIQUE: Multiplanar multisequence MR imaging of the abdomen was performed both before and after the administration of intravenous contrast. Heavily T2-weighted images of the biliary and pancreatic ducts were obtained, and three-dimensional MRCP images were rendered by post processing.  CONTRAST:  12mL MULTIHANCE GADOBENATE DIMEGLUMINE 529 MG/ML IV SOLN  COMPARISON:  CT of 08/12/14.  FINDINGS: Lower chest: Normal heart size.  Trace bilateral pleural effusions.  Motion degradation throughout the abdomen.  Tiny hiatal hernia.  Hepatobiliary: No focal liver lesion. Minimal intrahepatic biliary ductal dilatation. Normal gallbladder. Common duct mildly dilated, up to 12 mm on image 46 of series 4. Distal common duct filling defect at the level of the ampulla. 9 mm on image 37 of series 4. Image 88 of series 400 and image 27 of series 11. This appears more distally positioned than on the 08/12/2014 CT.  Spleen: Normal  Pancreas: Moderate peripancreatic edema, increased since 08/12/14. Example image 26 of series 3. No pancreatic ductal dilatation or mass. No peripancreatic fluid collection. Diffuse pancreatic enhancement, without evidence of necrosis.  Stomach/Bowel: Normal distal stomach and imaged abdominal bowel loops.  Adrenals/Urinary tract: Normal adrenal glands. 2 right renal lesions. An interpolar lesion measures 6 mm and is too small to entirely characterize.  Likely a cyst. A lower pole right renal lesion measures 9 mm and is most consistent with a cyst.  Corresponding to the CT abnormality, 1.5 cm inter/lower pole left renal lesion demonstrates T2 hypo intensity on image 42 of series 3 and post-contrast enhancement, including on image 58 of series 13002. No hydronephrosis.  Vascular/Lymphatic: Atherosclerotic irregularity in the aorta and branch vessels. No retroperitoneal  adenopathy. Left renal vein patent.  Musculoskeletal: No focal osseous abnormality.  Other:  No ascites.  IMPRESSION: 1. Mildly motion degraded exam. 2. Filling defect within the distal common duct, highly suspicious for periampullary stone. This is positioned more distally than on 08/12/14 CT. Mild secondary biliary ductal dilatation. 3. 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 4. Progressive moderate pancreatitis.   Electronically Signed   By: Abigail Miyamoto M.D.   On: 08/14/2014 10:21   Mr Jeananne Rama W/wo Cm/mrcp  08/14/2014   CLINICAL DATA:  Dilated common bile duct on CT. Renal mass. Elevated liver function tests.  EXAM: MRI ABDOMEN WITHOUT AND WITH CONTRAST (INCLUDING MRCP)  TECHNIQUE: Multiplanar multisequence MR imaging of the abdomen was performed both before and after the administration of intravenous contrast. Heavily T2-weighted images of the biliary and pancreatic ducts were obtained, and three-dimensional MRCP images were rendered by post processing.  CONTRAST:  80mL MULTIHANCE GADOBENATE DIMEGLUMINE 529 MG/ML IV SOLN  COMPARISON:  CT of 08/12/14.  FINDINGS: Lower chest: Normal heart size.  Trace bilateral pleural effusions.  Motion degradation throughout the abdomen.  Tiny hiatal hernia.  Hepatobiliary: No focal liver lesion. Minimal intrahepatic biliary ductal dilatation. Normal gallbladder. Common duct mildly dilated, up to 12 mm on image 46 of series 4. Distal common duct filling defect at the level of the ampulla. 9 mm on image 37 of series 4. Image 88 of series 400 and image 27 of series 11. This appears more distally positioned than on the 08/12/2014 CT.  Spleen: Normal  Pancreas: Moderate peripancreatic edema, increased since 08/12/14. Example image 26 of series 3. No pancreatic ductal dilatation or mass. No peripancreatic fluid collection. Diffuse pancreatic enhancement, without evidence of necrosis.  Stomach/Bowel: Normal distal stomach  and imaged abdominal bowel loops.  Adrenals/Urinary tract: Normal adrenal glands. 2 right renal lesions. An interpolar lesion measures 6 mm and is too small to entirely characterize. Likely a cyst. A lower pole right renal lesion measures 9 mm and is most consistent with a cyst.  Corresponding to the CT abnormality, 1.5 cm inter/lower pole left renal lesion demonstrates T2 hypo intensity on image 42 of series 3 and post-contrast enhancement, including on image 58 of series 13002. No hydronephrosis.  Vascular/Lymphatic: Atherosclerotic irregularity in the aorta and branch vessels. No retroperitoneal adenopathy. Left renal vein patent.  Musculoskeletal: No focal osseous abnormality.  Other:  No ascites.  IMPRESSION: 1. Mildly motion degraded exam. 2. Filling defect within the distal common duct, highly suspicious for periampullary stone. This is positioned more distally than on 08/12/14 CT. Mild secondary biliary ductal dilatation. 3. 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 4. Progressive moderate pancreatitis.   Electronically Signed   By: Abigail Miyamoto M.D.   On: 08/14/2014 10:21    Assessment / Plan: #1 80 yo male with acute biliary pancreatitis- stable and improving- MRCP showed filling defect in distal CBD highly suspicious for periampullary stone.  Clear liquid diet for now then NPO after midnight.  #2 Renal mass noted on CT  concerning for renal cell CA  #3 COPD:  On O2 at home.  #4 AODM   -Will plan for ERCP on 9/23 at 9 am. Already on Unasyn.   LOS: 3 days   ZEHR, JESSICA D.  08/15/2014, 9:15 AM  Pager number [phone]   ________________________________________________________________________  Velora Heckler GI MD note:  I personally examined the patient, reviewed the data and agree with the assessment and plan described above.  ERCP tomorrow.  Holding lovenox for now.   Owens Loffler, MD [facility] Pager [phone]

## 2014-08-15 NOTE — Progress Notes (Signed)
PROGRESS NOTE  Jim [name] RWE:[id number] DOB: 23-Nov-1932 DOA: 08/12/2014 PCP: [name], MD Interim summary  80 year old male with past medical history of hypertension, dyslipidemia, diabetes, chronic diastolic CHF, history of prostate cancer who presented to [facility] ED 08/12/2014 with worsening abdominal pain over past 24 hours prior to this admission. Patient explains pain is in the upper and mid abdomen associated with multiple episodes of nonbloody vomiting. In emergency department, the patient was noted to have a lipase >3000, AST 65, ALT 90, ALT 187, total bilirubin 6.5. BNP was 772, WBC 15.6. CT abdomen showed focal midline retroperitoneal inflammation unclear if this is sequela of duodenitis or possible pancreatitis, possible mid to proximal small bowel wall thickening, non-obstructing sub-centimeters distal common bile duct lesion which could reflect polyp or less likely neoplasm, 16 mm solid left lower pole renal mass highly concerning for renal cell carcinoma.  The patient was placed on bowel rest and started on intravenous fluids for his acute pancreatitis. Gastroenterology was consulted. MRCP was performed as well as MRI of the abdomen to clarify his left renal mass. The MRCP revealed a distal common bile duct filling defect concerning for a stone. MRI of the abdomen revealed a suspicious left renal lesion concerning for renal cell carcinoma. Urology has been consulted, and felt due to slow growing nature of these masses, pt can follow up as an outpatient. Gastroenterology plans for ERCP on 08/16/2014. The patient continues on Unasyn for empiric treatment of aspiration pneumonitis and possible cholangitis. Assessment/Plan:  Acute on chronic respiratory failure  -Likely multifactorial including the patient's COPD and possible aspiration pneumonitis  -Patient is supposed to be on oxygen at home (2.5L), but only uses it when necessary  -Concerned about aspiration pneumonitis given the  patient's intractable vomiting  -Repeat chest x-ray--bibasilar scarring, although question increase in right lower lobe opacity  -Continue aerosolized albuterol and Atrovent  -Restarted long-acting beta agonist  Fever  -Likely related to pancreatitis/cholangitis/aspiration pneumonitis  -Last fever  of 101.25F on 08/12/14 evening  -Broad antibiotic coverage to cover for aspiration pneumonitis-->unasyn  -UA neg for pyuria  -Follow blood culture--negative to date  -WBC improving  Acute pancreatitis  -may have contributed to fever  -concerned about biliary etiology due to CBD abnormality  -LFTs, lipase improving  -Appreciate GI followup  -MRCP--distant, bowel duct filling defect suspicious for periampullary stone  -ERCP plans noted by GI on 08/16/14  -continue clear liquids  -check lipids--triglycerides 156  -08/12/2014 CT abdomen and pelvis reveals--Focal midline retroperitoneal inflammation, Nonobstructing subcentimeter distal Common bile duct lesion, 16 mm solid LEFT lower pole renal mass  Renal mass  -MRI of the kidneys--suspicious for RCC  -Appreciate urology consult-->followup with Dr. Consuella Lose as outpatient Chronic diastolic CHF  -Appears euvolemic  -continue carvedilol  -saline lock IVF when pt able to tolerate diet COPD  -Stable  -Restarted long-acting beta agonist  -Continue aerosolized albuterol and Atrovent  Hypertension  -Discontinue losartan as the patient's blood pressure soft  -Continue carvedilol  Diabetes mellitus type 2  -08/12/2014 hemoglobin A1c 7.0  -Discontinue metformin  -NovoLog sliding scale  Family Communication: Family updated at beside  Disposition Plan: Home  With home health PT Antibiotics:  Levofloxacin 9/19>>9/20  unasyn 08/13/14>>>         Procedures/Studies: Dg Chest 2 View  08/13/2014   CLINICAL DATA:  Shortness of breath.  EXAM: CHEST  2 VIEW  COMPARISON:  08/12/2014.  FINDINGS: Normal sized heart. No significant change in  bilateral  linear densities. Moderate sized hiatal hernia. This contained fat only on the CT dated 06/27/2010. Mild thoracic spine degenerative changes.  IMPRESSION: 1. No acute abnormality. 2. Stable bilateral scarring. 3. Moderate-sized hiatal hernia, previously shown to contain fat only.   Electronically Signed   By: Enrique Sack M.D.   On: 08/13/2014 14:25   Dg Chest 2 View  08/12/2014   CLINICAL DATA:  Severe shortness of breath. Dyspnea. Abdominal pain and bloating.  EXAM: CHEST  2 VIEW  COMPARISON:  Chest radiograph performed 12/23/2010  FINDINGS: The lungs are well-aerated. Right mid lung scarring is noted. Minimal left basilar opacity may reflect atelectasis or possibly mild pneumonia, given the patient's symptoms. Mild vascular congestion is noted. There is no evidence of pleural effusion or pneumothorax.  The heart is normal in size; the mediastinal contour is within normal limits. No acute osseous abnormalities are seen.  IMPRESSION: Right mid lung scarring again noted; minimal left basilar airspace opacity may reflect atelectasis or possibly mild pneumonia, given the patient's symptoms. Mild vascular congestion noted.   Electronically Signed   By: Garald Balding M.D.   On: 08/12/2014 05:22   Ct Abdomen Pelvis W Contrast  08/12/2014   CLINICAL DATA:  Severe shortness of breath and severe abdominal pain.  EXAM: CT ABDOMEN AND PELVIS WITH CONTRAST  TECHNIQUE: Multidetector CT imaging of the abdomen and pelvis was performed using the standard protocol following bolus administration of intravenous contrast.  CONTRAST:  5mL OMNIPAQUE IOHEXOL 300 MG/ML SOLN, 155mL OMNIPAQUE IOHEXOL 300 MG/ML SOLN  COMPARISON:  Abdominal ultrasound November 10, 2013  FINDINGS: LUNG BASES: Limited view of the lung bases demonstrates severe centrilobular emphysema. Dependent atelectasis. Heart size is normal. Coronary artery calcifications. Included pericardium is unremarkable.  SOLID ORGANS: The liver, spleen, gallbladder, and  adrenal glands are unremarkable. Focal midline retroperitoneal inflammation, contiguous with the inferior margin of the pancreas and, ligaments of Treitz.  GASTROINTESTINAL TRACT: Small hiatal hernia, with under amount of surrounding mildly inflamed fat. The stomach, small and large bowel are normal in course and caliber without inflammatory changes. Suspected proximal small bowel wall thickening. Mild colonic diverticulosis. Normal appendix.  KIDNEYS/ URINARY TRACT: Kidneys are orthotopic, demonstrating symmetric enhancement. Solid enhancing LEFT lower pole 16 mm mass with mild washout on delayed phase. No nephrolithiasis, hydronephrosis. 10 mm RIGHT lower pole cysts. Too small to characterize RIGHT interpolar hypodensity. The unopacified ureters are normal in course and caliber. Delayed imaging through the kidneys demonstrates symmetric prompt contrast excretion within the proximal urinary collecting system. Urinary bladder is partially distended and unremarkable.  PERITONEUM/RETROPERITONEUM: The Common bile duct is not enlarged, however there is a 5 mm soft tissue lesion along the posterior wall of the distal Common bile duct, 51/101 and 50/119. No intraperitoneal free fluid nor free air. Aortoiliac vessels are normal in course and caliber, moderate calcific atherosclerosis. No lymphadenopathy by CT size criteria. Prostate brachytherapy seeds. Partially imaged small LEFT hydrocele.  SOFT TISSUE/OSSEOUS STRUCTURES: Nonsuspicious. Grade 1 L4-5 anterolisthesis on spondylotic basis.  IMPRESSION: Focal midline retroperitoneal inflammation, unclear if this is reflects sequelae duodenitis or possible pancreatitis. Suspected mild proximal small bowel wall thickening.  Nonobstructing subcentimeter distal Common bile duct lesion, which could reflect polyp or less likely neoplasm, possible sludge ball. Recommend MRCP on a nonemergent basis.  Small hiatal hernia with moderate amount of associated mildly inflamed fat, which  is possibly tracking from the retroperitoneum.  16 mm solid LEFT lower pole renal mass highly concerning for renal cell carcinoma. Recommend  further characterization with MRI, biopsy or surgery. This recommendation follows ACR consensus guidelines: Managing Incidental Findings on Abdominal CT: White Paper of the ACR Incidental Findings Committee. Joellyn Rued Radiol [phone]   Electronically Signed   By: Elon Alas   On: 08/12/2014 06:58   Mr 3d Recon At Scanner  08/14/2014   CLINICAL DATA:  Dilated common bile duct on CT. Renal mass. Elevated liver function tests.  EXAM: MRI ABDOMEN WITHOUT AND WITH CONTRAST (INCLUDING MRCP)  TECHNIQUE: Multiplanar multisequence MR imaging of the abdomen was performed both before and after the administration of intravenous contrast. Heavily T2-weighted images of the biliary and pancreatic ducts were obtained, and three-dimensional MRCP images were rendered by post processing.  CONTRAST:  57mL MULTIHANCE GADOBENATE DIMEGLUMINE 529 MG/ML IV SOLN  COMPARISON:  CT of 08/12/14.  FINDINGS: Lower chest: Normal heart size.  Trace bilateral pleural effusions.  Motion degradation throughout the abdomen.  Tiny hiatal hernia.  Hepatobiliary: No focal liver lesion. Minimal intrahepatic biliary ductal dilatation. Normal gallbladder. Common duct mildly dilated, up to 12 mm on image 46 of series 4. Distal common duct filling defect at the level of the ampulla. 9 mm on image 37 of series 4. Image 88 of series 400 and image 27 of series 11. This appears more distally positioned than on the 08/12/2014 CT.  Spleen: Normal  Pancreas: Moderate peripancreatic edema, increased since 08/12/14. Example image 26 of series 3. No pancreatic ductal dilatation or mass. No peripancreatic fluid collection. Diffuse pancreatic enhancement, without evidence of necrosis.  Stomach/Bowel: Normal distal stomach and imaged abdominal bowel loops.  Adrenals/Urinary tract: Normal adrenal glands. 2 right renal  lesions. An interpolar lesion measures 6 mm and is too small to entirely characterize. Likely a cyst. A lower pole right renal lesion measures 9 mm and is most consistent with a cyst.  Corresponding to the CT abnormality, 1.5 cm inter/lower pole left renal lesion demonstrates T2 hypo intensity on image 42 of series 3 and post-contrast enhancement, including on image 58 of series 13002. No hydronephrosis.  Vascular/Lymphatic: Atherosclerotic irregularity in the aorta and branch vessels. No retroperitoneal adenopathy. Left renal vein patent.  Musculoskeletal: No focal osseous abnormality.  Other:  No ascites.  IMPRESSION: 1. Mildly motion degraded exam. 2. Filling defect within the distal common duct, highly suspicious for periampullary stone. This is positioned more distally than on 08/12/14 CT. Mild secondary biliary ductal dilatation. 3. 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 4. Progressive moderate pancreatitis.   Electronically Signed   By: Abigail Miyamoto M.D.   On: 08/14/2014 10:21   Mr Jeananne Rama W/wo Cm/mrcp  08/14/2014   CLINICAL DATA:  Dilated common bile duct on CT. Renal mass. Elevated liver function tests.  EXAM: MRI ABDOMEN WITHOUT AND WITH CONTRAST (INCLUDING MRCP)  TECHNIQUE: Multiplanar multisequence MR imaging of the abdomen was performed both before and after the administration of intravenous contrast. Heavily T2-weighted images of the biliary and pancreatic ducts were obtained, and three-dimensional MRCP images were rendered by post processing.  CONTRAST:  73mL MULTIHANCE GADOBENATE DIMEGLUMINE 529 MG/ML IV SOLN  COMPARISON:  CT of 08/12/14.  FINDINGS: Lower chest: Normal heart size.  Trace bilateral pleural effusions.  Motion degradation throughout the abdomen.  Tiny hiatal hernia.  Hepatobiliary: No focal liver lesion. Minimal intrahepatic biliary ductal dilatation. Normal gallbladder. Common duct mildly dilated, up to 12 mm on image 46 of  series 4. Distal common duct filling defect at the level of  the ampulla. 9 mm on image 37 of series 4. Image 88 of series 400 and image 27 of series 11. This appears more distally positioned than on the 08/12/2014 CT.  Spleen: Normal  Pancreas: Moderate peripancreatic edema, increased since 08/12/14. Example image 26 of series 3. No pancreatic ductal dilatation or mass. No peripancreatic fluid collection. Diffuse pancreatic enhancement, without evidence of necrosis.  Stomach/Bowel: Normal distal stomach and imaged abdominal bowel loops.  Adrenals/Urinary tract: Normal adrenal glands. 2 right renal lesions. An interpolar lesion measures 6 mm and is too small to entirely characterize. Likely a cyst. A lower pole right renal lesion measures 9 mm and is most consistent with a cyst.  Corresponding to the CT abnormality, 1.5 cm inter/lower pole left renal lesion demonstrates T2 hypo intensity on image 42 of series 3 and post-contrast enhancement, including on image 58 of series 13002. No hydronephrosis.  Vascular/Lymphatic: Atherosclerotic irregularity in the aorta and branch vessels. No retroperitoneal adenopathy. Left renal vein patent.  Musculoskeletal: No focal osseous abnormality.  Other:  No ascites.  IMPRESSION: 1. Mildly motion degraded exam. 2. Filling defect within the distal common duct, highly suspicious for periampullary stone. This is positioned more distally than on 08/12/14 CT. Mild secondary biliary ductal dilatation. 3. 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 4. Progressive moderate pancreatitis.   Electronically Signed   By: Abigail Miyamoto M.D.   On: 08/14/2014 10:21         Subjective: Patient denies fevers, chills, headache, chest pain, dyspnea, nausea, vomiting, diarrhea, abdominal pain, dysuria, hematuria   Objective: Filed Vitals:   08/15/14 1031 08/15/14 1132 08/15/14 1344 08/15/14 1559  BP: 164/75  177/93   Pulse: 71  76     Temp: 98.8 F (37.1 C)  98.9 F (37.2 C)   TempSrc: Oral  Oral   Resp: 16  18   Height:      Weight:      SpO2: 95% 94% 99% 96%    Intake/Output Summary (Last 24 hours) at 08/15/14 1833 Last data filed at 08/15/14 1521  Gross per 24 hour  Intake   1600 ml  Output      2 ml  Net   1598 ml   Weight change: -0.03 kg (-1.1 oz) Exam:   General:  Pt is alert, follows commands appropriately, not in acute distress  HEENT: No icterus, No thrush, [place]/AT  Cardiovascular: RRR, S1/S2, no rubs, no gallops  Respiratory: CTA bilaterally, no wheezing, no crackles, no rhonchi  Abdomen: Soft/+BS, non tender, non distended, no guarding  Extremities: No edema, No lymphangitis, No petechiae, No rashes, no synovitis  Data Reviewed: Basic Metabolic Panel:  Recent Labs Lab 08/12/14 0431 08/13/14 0554 08/14/14 0437  NA 136* 134* 136*  K 4.0 3.8 3.8  CL 93* 99 101  CO2 24 23 21   GLUCOSE 200* 149* 158*  BUN 16 21 14   CREATININE 0.91 1.14 0.80  CALCIUM 9.3 8.2* 8.2*   Liver Function Tests:  Recent Labs Lab 08/12/14 0431 08/13/14 0554 08/14/14 0437  AST 65* 29 22  ALT 90* 44 33  ALKPHOS 187* 123* 123*  BILITOT 6.5* 2.4* 2.1*  PROT 7.8 5.9* 6.0  ALBUMIN 4.0 2.7* 2.6*    Recent Labs Lab 08/12/14 0431 08/13/14 0554 08/14/14 0437 08/15/14 0435  LIPASE >3000* 255* 113* 68*   No results found for this basename: AMMONIA,  in the last 168 hours CBC:  Recent Labs Lab 08/12/14 0431  08/13/14 0554 08/14/14 0437  WBC 15.6* 11.0* 10.1  NEUTROABS 13.9*  --   --   HGB 14.0 11.5* 11.6*  HCT 41.1 34.3* 34.7*  MCV 93.4 95.3 94.6  PLT 206 124* 120*   Cardiac Enzymes: No results found for this basename: CKTOTAL, CKMB, CKMBINDEX, TROPONINI,  in the last 168 hours BNP: No components found with this basename: POCBNP,  CBG:  Recent Labs Lab 08/14/14 1711 08/14/14 2144 08/15/14 0736 08/15/14 1213 08/15/14 1821  GLUCAP 135* 137* 138* 147* 120*    Recent Results (from  the past 240 hour(s))  CULTURE, BLOOD (ROUTINE X 2)     Status: None   Collection Time    08/12/14  9:19 AM      Result Value Ref Range Status   Specimen Description BLOOD RIGHT ARM   Final   Special Requests     Final   Value: BOTTLES DRAWN AEROBIC AND ANAEROBIC 10CC BLUE BOTTLE, 7 CC RED BOTTLE   Culture  Setup Time     Final   Value: 08/12/2014 13:42     Performed at [facility]   Culture     Final   Value:        BLOOD CULTURE RECEIVED NO GROWTH TO DATE CULTURE WILL BE HELD FOR 5 DAYS BEFORE ISSUING A FINAL NEGATIVE REPORT     Performed at [facility]   Report Status PENDING   Incomplete  CULTURE, BLOOD (ROUTINE X 2)     Status: None   Collection Time    08/12/14  9:24 AM      Result Value Ref Range Status   Specimen Description BLOOD RIGHT ARM   Final   Special Requests     Final   Value: BOTTLES DRAWN AEROBIC AND ANAEROBIC 10CC BOTH BOTTLES   Culture  Setup Time     Final   Value: 08/12/2014 13:42     Performed at [facility]   Culture     Final   Value:        BLOOD CULTURE RECEIVED NO GROWTH TO DATE CULTURE WILL BE HELD FOR 5 DAYS BEFORE ISSUING A FINAL NEGATIVE REPORT     Performed at [facility]   Report Status PENDING   Incomplete  CULTURE, EXPECTORATED SPUTUM-ASSESSMENT     Status: None   Collection Time    08/13/14  1:56 AM      Result Value Ref Range Status   Specimen Description SPUTUM   Final   Special Requests NONE   Final   Sputum evaluation     Final   Value: MICROSCOPIC FINDINGS SUGGEST THAT THIS SPECIMEN IS NOT REPRESENTATIVE OF LOWER RESPIRATORY SECRETIONS. PLEASE RECOLLECT.     Enid Derry AT 0216 ON 09.20.2015 BY NBROOKS   Report Status 08/13/2014 FINAL   Final  CULTURE, EXPECTORATED SPUTUM-ASSESSMENT     Status: None   Collection Time    08/13/14  6:47 AM      Result Value Ref Range Status   Specimen Description SPUTUM   Final   Special Requests NONE   Final   Sputum evaluation     Final   Value: THIS SPECIMEN IS  ACCEPTABLE. RESPIRATORY CULTURE REPORT TO FOLLOW.   Report Status 08/13/2014 FINAL   Final  CULTURE, RESPIRATORY (NON-EXPECTORATED)     Status: None   Collection Time    08/13/14  6:47 AM      Result Value Ref Range Status   Specimen Description SPUTUM  Final   Special Requests NONE   Final   Gram Stain     Final   Value: FEW WBC PRESENT, PREDOMINANTLY MONONUCLEAR     RARE SQUAMOUS EPITHELIAL CELLS PRESENT     RARE GRAM POSITIVE COCCI     IN PAIRS IN CLUSTERS RARE GRAM NEGATIVE RODS     Performed at [facility]   Culture     Final   Value: NORMAL OROPHARYNGEAL FLORA     Performed at [facility]   Report Status 08/15/2014 FINAL   Final  URINE CULTURE     Status: None   Collection Time    08/13/14  1:58 PM      Result Value Ref Range Status   Specimen Description URINE, CATHETERIZED   Final   Special Requests NONE   Final   Culture  Setup Time     Final   Value: 08/13/2014 19:11     Performed at [facility]     Final   Value: NO GROWTH     Performed at [facility]   Culture     Final   Value: NO GROWTH     Performed at [facility]   Report Status 08/14/2014 FINAL   Final     Scheduled Meds: . ampicillin-sulbactam (UNASYN) IV  1.5 g Intravenous 4 times per day  . aspirin EC  81 mg Oral Daily  . atorvastatin  20 mg Oral q1800  . budesonide-formoterol  2 puff Inhalation BID  . carvedilol  25 mg Oral BID WC  . guaiFENesin  600 mg Oral Daily  . [START ON 08/16/2014] indomethacin  50 mg Rectal On Call to OR  . [START ON 08/16/2014] indomethacin  50 mg Rectal On Call to OR  . insulin aspart  0-9 Units Subcutaneous TID WC  . ipratropium-albuterol  3 mL Nebulization QID  . losartan  50 mg Oral Daily  . sodium chloride  3 mL Intravenous Q12H   Continuous Infusions: . sodium chloride 100 mL/hr at 08/15/14 0537     Ambika Zettlemoyer, DO  [facility] Pager [phone]  If 7PM-7AM, please contact  night-coverage [email or web address] Password [facility] 08/15/2014, 6:33 PM   LOS: 3 days

## 2014-08-15 NOTE — Progress Notes (Signed)
Pt had Coreg 25mg  @0801  this shift. NT reported BP-164/75 @1031 . MD notified, no new orders at this time.

## 2014-08-16 ENCOUNTER — Inpatient Hospital Stay (HOSPITAL_COMMUNITY): Payer: Medicare Other

## 2014-08-16 ENCOUNTER — Inpatient Hospital Stay (HOSPITAL_COMMUNITY): Payer: Medicare Other | Admitting: Certified Registered Nurse Anesthetist

## 2014-08-16 ENCOUNTER — Encounter (HOSPITAL_COMMUNITY)
Admission: EM | Disposition: A | Discharge status: Home Health | Payer: Self-pay | Source: Home / Self Care | Attending: Internal Medicine

## 2014-08-16 ENCOUNTER — Encounter (HOSPITAL_COMMUNITY): Payer: Medicare Other | Admitting: Certified Registered Nurse Anesthetist

## 2014-08-16 ENCOUNTER — Encounter (HOSPITAL_COMMUNITY): Payer: Self-pay | Admitting: *Deleted

## 2014-08-16 DIAGNOSIS — K8050 Calculus of bile duct without cholangitis or cholecystitis without obstruction: Secondary | ICD-10-CM

## 2014-08-16 HISTORY — PX: ERCP: SHX5425

## 2014-08-16 LAB — GLUCOSE, CAPILLARY
GLUCOSE-CAPILLARY: 115 mg/dL — AB (ref 70–99)
GLUCOSE-CAPILLARY: 136 mg/dL — AB (ref 70–99)
Glucose-Capillary: 121 mg/dL — ABNORMAL HIGH (ref 70–99)
Glucose-Capillary: 138 mg/dL — ABNORMAL HIGH (ref 70–99)

## 2014-08-16 LAB — COMPREHENSIVE METABOLIC PANEL
ALT: 19 U/L (ref 0–53)
AST: 15 U/L (ref 0–37)
Albumin: 2.2 g/dL — ABNORMAL LOW (ref 3.5–5.2)
Alkaline Phosphatase: 110 U/L (ref 39–117)
Anion gap: 14 (ref 5–15)
BUN: 11 mg/dL (ref 6–23)
CO2: 20 meq/L (ref 19–32)
Calcium: 8 mg/dL — ABNORMAL LOW (ref 8.4–10.5)
Chloride: 102 mEq/L (ref 96–112)
Creatinine, Ser: 0.72 mg/dL (ref 0.50–1.35)
GFR calc Af Amer: >90 mL/min (ref >90)
GFR, EST NON AFRICAN AMERICAN: 86 mL/min — AB (ref >90)
Glucose, Bld: 116 mg/dL — ABNORMAL HIGH (ref 70–99)
POTASSIUM: 3.3 meq/L — AB (ref 3.7–5.3)
SODIUM: 136 meq/L — AB (ref 137–147)
Total Bilirubin: 1.4 mg/dL — ABNORMAL HIGH (ref 0.3–1.2)
Total Protein: 5.5 g/dL — ABNORMAL LOW (ref 6.0–8.3)

## 2014-08-16 LAB — CBC
HCT: 31.7 % — ABNORMAL LOW (ref 39.0–52.0)
Hemoglobin: 10.8 g/dL — ABNORMAL LOW (ref 13.0–17.0)
MCH: 31.5 pg (ref 26.0–34.0)
MCHC: 34.1 g/dL (ref 30.0–36.0)
MCV: 92.4 fL (ref 78.0–100.0)
Platelets: 126 10*3/uL — ABNORMAL LOW (ref 150–400)
RBC: 3.43 MIL/uL — ABNORMAL LOW (ref 4.22–5.81)
RDW: 12.9 % (ref 11.5–15.5)
WBC: 10.5 10*3/uL (ref 4.0–10.5)

## 2014-08-16 LAB — LIPASE, BLOOD: Lipase: 60 U/L — ABNORMAL HIGH (ref 11–59)

## 2014-08-16 SURGERY — ERCP, WITH INTERVENTION IF INDICATED
Anesthesia: General

## 2014-08-16 MED ORDER — LIDOCAINE HCL (CARDIAC) 20 MG/ML IV SOLN
INTRAVENOUS | Status: AC
  Filled 2014-08-16: qty 5

## 2014-08-16 MED ORDER — EPHEDRINE SULFATE 50 MG/ML IJ SOLN
INTRAMUSCULAR | Status: AC
  Filled 2014-08-16: qty 1

## 2014-08-16 MED ORDER — PROPOFOL 10 MG/ML IV BOLUS
INTRAVENOUS | Status: AC
  Filled 2014-08-16: qty 20

## 2014-08-16 MED ORDER — EPHEDRINE SULFATE 50 MG/ML IJ SOLN
INTRAMUSCULAR | Status: DC | PRN
  Administered 2014-08-16 (×2): 10 mg via INTRAVENOUS

## 2014-08-16 MED ORDER — FENTANYL CITRATE 0.05 MG/ML IJ SOLN
INTRAMUSCULAR | Status: AC
  Filled 2014-08-16: qty 2

## 2014-08-16 MED ORDER — ONDANSETRON HCL 4 MG/2ML IJ SOLN
INTRAMUSCULAR | Status: DC | PRN
  Administered 2014-08-16: 4 mg via INTRAVENOUS

## 2014-08-16 MED ORDER — PHENYLEPHRINE HCL 10 MG/ML IJ SOLN
INTRAMUSCULAR | Status: DC | PRN
  Administered 2014-08-16 (×2): 80 ug via INTRAVENOUS

## 2014-08-16 MED ORDER — SUCCINYLCHOLINE CHLORIDE 20 MG/ML IJ SOLN
INTRAMUSCULAR | Status: DC | PRN
  Administered 2014-08-16: 100 mg via INTRAVENOUS

## 2014-08-16 MED ORDER — ALBUTEROL SULFATE HFA 108 (90 BASE) MCG/ACT IN AERS
INHALATION_SPRAY | RESPIRATORY_TRACT | Status: DC | PRN
  Administered 2014-08-16: 5 via RESPIRATORY_TRACT

## 2014-08-16 MED ORDER — LACTATED RINGERS IV SOLN
INTRAVENOUS | Status: DC | PRN
  Administered 2014-08-16: 09:00:00 via INTRAVENOUS

## 2014-08-16 MED ORDER — ALBUTEROL SULFATE HFA 108 (90 BASE) MCG/ACT IN AERS
INHALATION_SPRAY | RESPIRATORY_TRACT | Status: AC
  Filled 2014-08-16: qty 6.7

## 2014-08-16 MED ORDER — SODIUM CHLORIDE 0.9 % IV SOLN
INTRAVENOUS | Status: DC | PRN
  Administered 2014-08-16: 10:00:00

## 2014-08-16 MED ORDER — FENTANYL CITRATE 0.05 MG/ML IJ SOLN
INTRAMUSCULAR | Status: DC | PRN
  Administered 2014-08-16 (×2): 25 ug via INTRAVENOUS

## 2014-08-16 MED ORDER — POTASSIUM CHLORIDE IN NACL 40-0.9 MEQ/L-% IV SOLN
INTRAVENOUS | Status: AC
  Administered 2014-08-16: 50 mL/h via INTRAVENOUS
  Filled 2014-08-16 (×2): qty 1000

## 2014-08-16 MED ORDER — LACTATED RINGERS IV SOLN: INTRAVENOUS | Status: DC

## 2014-08-16 MED ORDER — SODIUM CHLORIDE 0.9 % IJ SOLN
INTRAMUSCULAR | Status: AC
  Filled 2014-08-16: qty 10

## 2014-08-16 MED ORDER — HYDROMORPHONE HCL 1 MG/ML IJ SOLN: 0.2500 mg | INTRAMUSCULAR | Status: DC | PRN

## 2014-08-16 MED ORDER — ONDANSETRON HCL 4 MG/2ML IJ SOLN
INTRAMUSCULAR | Status: AC
  Filled 2014-08-16: qty 2

## 2014-08-16 MED ORDER — PROPOFOL 10 MG/ML IV BOLUS
INTRAVENOUS | Status: DC | PRN
  Administered 2014-08-16: 120 mg via INTRAVENOUS

## 2014-08-16 MED ORDER — LIDOCAINE HCL (CARDIAC) 20 MG/ML IV SOLN
INTRAVENOUS | Status: DC | PRN
Start: 2014-08-16 — End: 2014-08-16
  Administered 2014-08-16: 100 mg via INTRAVENOUS

## 2014-08-16 NOTE — Anesthesia Preprocedure Evaluation (Addendum)
Anesthesia Evaluation  Patient identified by MRN, date of birth, ID band Patient awake    Reviewed: Allergy & Precautions, H&P , NPO status , Patient's Chart, lab work & pertinent test results, reviewed documented beta blocker date and time   Airway Mallampati: II TM Distance: >3 FB Neck ROM: full    Dental  (+) Edentulous Upper, Edentulous Lower, Dental Advisory Given   Pulmonary COPD COPD inhaler and oxygen dependent, former smoker,  Severe COPD + rhonchi   + wheezing      Cardiovascular hypertension, Pt. on medications and Pt. on home beta blockers Rhythm:regular Rate:Normal     Neuro/Psych negative neurological ROS  negative psych ROS   GI/Hepatic negative GI ROS, (+) Hepatitis -, Unspecifiedpancreatitis   Endo/Other  negative endocrine ROSdiabetes, Well Controlled, Type 2, Oral Hypoglycemic Agents  Renal/GU negative Renal ROS  negative genitourinary   Musculoskeletal   Abdominal   Peds  Hematology negative hematology ROS (+) anemia , hgb 10.6  plt 126   Anesthesia Other Findings   Reproductive/Obstetrics negative OB ROS                          Anesthesia Physical Anesthesia Plan  ASA: IV  Anesthesia Plan: General   Post-op Pain Management:    Induction: Intravenous  Airway Management Planned: Oral ETT  Additional Equipment:   Intra-op Plan:   Post-operative Plan: Possible Post-op intubation/ventilation  Informed Consent: I have reviewed the patients History and Physical, chart, labs and discussed the procedure including the risks, benefits and alternatives for the proposed anesthesia with the patient or authorized representative who has indicated his/her understanding and acceptance.   Dental Advisory Given  Plan Discussed with: CRNA and Surgeon  Anesthesia Plan Comments:        Anesthesia Quick Evaluation

## 2014-08-16 NOTE — Interval H&P Note (Signed)
History and Physical Interval Note:  08/16/2014 8:25 AM  Cori Razor  has presented today for surgery, with the diagnosis of CBD stone  The various methods of treatment have been discussed with the patient and family. After consideration of risks, benefits and other options for treatment, the patient has consented to  Procedure(s): ENDOSCOPIC RETROGRADE CHOLANGIOPANCREATOGRAPHY (ERCP) (N/A) as a surgical intervention .  The patient's history has been reviewed, patient examined, no change in status, stable for surgery.  I have reviewed the patient's chart and labs.  Questions were answered to the patient's satisfaction.     Milus Banister

## 2014-08-16 NOTE — Progress Notes (Signed)
PROGRESS NOTE    SIRE POET PJS:[id number] DOB: 1932-02-17 DOA: 08/12/2014 PCP: [name], MD   Interim summary   80 year old male with past medical history of hypertension, dyslipidemia, diabetes, chronic diastolic CHF, history of prostate cancer who presented to [facility] ED 08/12/2014 with worsening abdominal pain over past 24 hours prior to this admission. Patient explains pain is in the upper and mid abdomen associated with multiple episodes of nonbloody vomiting. In emergency department, the patient was noted to have a lipase >3000, AST 65, ALT 90, ALT 187, total bilirubin 6.5. BNP was 772, WBC 15.6. CT abdomen showed focal midline retroperitoneal inflammation unclear if this is sequela of duodenitis or possible pancreatitis, possible mid to proximal small bowel wall thickening, non-obstructing sub-centimeters distal common bile duct lesion which could reflect polyp or less likely neoplasm, 16 mm solid left lower pole renal mass highly concerning for renal cell carcinoma.   The patient was placed on bowel rest and started on intravenous fluids for his acute pancreatitis. Gastroenterology was consulted. MRCP was performed as well as MRI of the abdomen to clarify his left renal mass. The MRCP revealed a distal common bile duct filling defect concerning for a stone. MRI of the abdomen revealed a suspicious left renal lesion concerning for renal cell carcinoma. Urology has been consulted, and felt due to slow growing nature of these masses, pt can follow up as an outpatient. Gastroenterology plans for ERCP on 08/16/2014. The patient continues on Unasyn for empiric treatment of aspiration pneumonitis and possible cholangitis.   Subjective:  Patient denies fevers, chills, headache, chest pain, dyspnea, nausea, vomiting, diarrhea, abdominal pain, dysuria, hematuria   Assessment/Plan:    Acute on chronic  respiratory failure  -Likely multifactorial including the patient's COPD and possible aspiration pneumonitis due to N&V  -Patient is supposed to be on oxygen at home (2.5L), but only uses it when necessary  -improved on ABX along with o2 and Nebs    Fever  -Likely related to pancreatitis/cholangitis/aspiration pneumonitis  -Last fever  of 101.39F on 08/12/14 evening  -Broad antibiotic coverage to cover for aspiration pneumonitis-->unasyn  -UA neg for pyuria  -Follow blood culture--negative to date  -WBC improving     Acute pancreatitis with CBD stone  --LFTs, lipase improving  --MRCP--distant, bowel duct filling defect suspicious for periampullary stone  -ERCP done on 08/16/14 shows Choledocholiathiasis, treated with biliary sphincterotomy and balloon sweeping.  -continue clear liquids & IVF - CCS consulted will see in am, will keep NPO after midnight.      Renal mass  -MRI of the kidneys--suspicious for RCC  -Appreciate urology consult-->followup with Dr. Consuella Lose as outpatient    Chronic diastolic CHF EF 29%  -Appears euvolemic  -continue carvedilol  -saline lock IVF when pt able to tolerate diet    COPD  -Stable  -Restarted long-acting beta agonist  -Continue aerosolized albuterol and Atrovent     Hypertension  -Discontinue losartan as the patient's blood pressure soft  -  Continue carvedilol     Diabetes mellitus type 2  -08/12/2014 hemoglobin A1c 7.0 , Discontinue metformin on ISS  CBG (last 3)   Recent Labs  08/16/14 0738 08/16/14 1042 08/16/14 1215  GLUCAP 121* 115* 136*         Family Communication:none present Disposition Plan: Home  With home health PT  Consults - urology, GI, CCS   Antibiotics:  Levofloxacin 9/19>>9/20  unasyn 08/13/14>>>     Procedures/Studies: Dg Chest 2 View  08/13/2014   CLINICAL DATA:  Shortness of breath.  EXAM: CHEST  2 VIEW  COMPARISON:  08/12/2014.  FINDINGS: Normal sized heart. No significant change  in bilateral linear densities. Moderate sized hiatal hernia. This contained fat only on the CT dated 06/27/2010. Mild thoracic spine degenerative changes.  IMPRESSION: 1. No acute abnormality. 2. Stable bilateral scarring. 3. Moderate-sized hiatal hernia, previously shown to contain fat only.   Electronically Signed   By: Enrique Sack M.D.   On: 08/13/2014 14:25   Dg Chest 2 View  08/12/2014   CLINICAL DATA:  Severe shortness of breath. Dyspnea. Abdominal pain and bloating.  EXAM: CHEST  2 VIEW  COMPARISON:  Chest radiograph performed 12/23/2010  FINDINGS: The lungs are well-aerated. Right mid lung scarring is noted. Minimal left basilar opacity may reflect atelectasis or possibly mild pneumonia, given the patient's symptoms. Mild vascular congestion is noted. There is no evidence of pleural effusion or pneumothorax.  The heart is normal in size; the mediastinal contour is within normal limits. No acute osseous abnormalities are seen.  IMPRESSION: Right mid lung scarring again noted; minimal left basilar airspace opacity may reflect atelectasis or possibly mild pneumonia, given the patient's symptoms. Mild vascular congestion noted.   Electronically Signed   By: Garald Balding M.D.   On: 08/12/2014 05:22   Ct Abdomen Pelvis W Contrast  08/12/2014   CLINICAL DATA:  Severe shortness of breath and severe abdominal pain.  EXAM: CT ABDOMEN AND PELVIS WITH CONTRAST  TECHNIQUE: Multidetector CT imaging of the abdomen and pelvis was performed using the standard protocol following bolus administration of intravenous contrast.  CONTRAST:  59mL OMNIPAQUE IOHEXOL 300 MG/ML SOLN, 123mL OMNIPAQUE IOHEXOL 300 MG/ML SOLN  COMPARISON:  Abdominal ultrasound November 10, 2013  FINDINGS: LUNG BASES: Limited view of the lung bases demonstrates severe centrilobular emphysema. Dependent atelectasis. Heart size is normal. Coronary artery calcifications. Included pericardium is unremarkable.  SOLID ORGANS: The liver, spleen,  gallbladder, and adrenal glands are unremarkable. Focal midline retroperitoneal inflammation, contiguous with the inferior margin of the pancreas and, ligaments of Treitz.  GASTROINTESTINAL TRACT: Small hiatal hernia, with under amount of surrounding mildly inflamed fat. The stomach, small and large bowel are normal in course and caliber without inflammatory changes. Suspected proximal small bowel wall thickening. Mild colonic diverticulosis. Normal appendix.  KIDNEYS/ URINARY TRACT: Kidneys are orthotopic, demonstrating symmetric enhancement. Solid enhancing LEFT lower pole 16 mm mass with mild washout on delayed phase. No nephrolithiasis, hydronephrosis. 10 mm RIGHT lower pole cysts. Too small to characterize RIGHT interpolar hypodensity. The unopacified ureters are normal in course and caliber. Delayed imaging through the kidneys demonstrates symmetric prompt contrast excretion within the proximal urinary collecting system. Urinary bladder is partially distended and unremarkable.  PERITONEUM/RETROPERITONEUM: The Common bile duct is not enlarged, however there is a 5 mm soft tissue lesion along the posterior wall of the distal Common bile duct, 51/101 and 50/119. No intraperitoneal free fluid nor free air. Aortoiliac vessels are normal in course and caliber, moderate  calcific atherosclerosis. No lymphadenopathy by CT size criteria. Prostate brachytherapy seeds. Partially imaged small LEFT hydrocele.  SOFT TISSUE/OSSEOUS STRUCTURES: Nonsuspicious. Grade 1 L4-5 anterolisthesis on spondylotic basis.  IMPRESSION: Focal midline retroperitoneal inflammation, unclear if this is reflects sequelae duodenitis or possible pancreatitis. Suspected mild proximal small bowel wall thickening.  Nonobstructing subcentimeter distal Common bile duct lesion, which could reflect polyp or less likely neoplasm, possible sludge ball. Recommend MRCP on a nonemergent basis.  Small hiatal hernia with moderate amount of associated mildly  inflamed fat, which is possibly tracking from the retroperitoneum.  16 mm solid LEFT lower pole renal mass highly concerning for renal cell carcinoma. Recommend further characterization with MRI, biopsy or surgery. This recommendation follows ACR consensus guidelines: Managing Incidental Findings on Abdominal CT: White Paper of the ACR Incidental Findings Committee. Joellyn Rued Radiol [phone]   Electronically Signed   By: Elon Alas   On: 08/12/2014 06:58   Mr 3d Recon At Scanner  08/14/2014   CLINICAL DATA:  Dilated common bile duct on CT. Renal mass. Elevated liver function tests.  EXAM: MRI ABDOMEN WITHOUT AND WITH CONTRAST (INCLUDING MRCP)  TECHNIQUE: Multiplanar multisequence MR imaging of the abdomen was performed both before and after the administration of intravenous contrast. Heavily T2-weighted images of the biliary and pancreatic ducts were obtained, and three-dimensional MRCP images were rendered by post processing.  CONTRAST:  10mL MULTIHANCE GADOBENATE DIMEGLUMINE 529 MG/ML IV SOLN  COMPARISON:  CT of 08/12/14.  FINDINGS: Lower chest: Normal heart size.  Trace bilateral pleural effusions.  Motion degradation throughout the abdomen.  Tiny hiatal hernia.  Hepatobiliary: No focal liver lesion. Minimal intrahepatic biliary ductal dilatation. Normal gallbladder. Common duct mildly dilated, up to 12 mm on image 46 of series 4. Distal common duct filling defect at the level of the ampulla. 9 mm on image 37 of series 4. Image 88 of series 400 and image 27 of series 11. This appears more distally positioned than on the 08/12/2014 CT.  Spleen: Normal  Pancreas: Moderate peripancreatic edema, increased since 08/12/14. Example image 26 of series 3. No pancreatic ductal dilatation or mass. No peripancreatic fluid collection. Diffuse pancreatic enhancement, without evidence of necrosis.  Stomach/Bowel: Normal distal stomach and imaged abdominal bowel loops.  Adrenals/Urinary tract: Normal adrenal  glands. 2 right renal lesions. An interpolar lesion measures 6 mm and is too small to entirely characterize. Likely a cyst. A lower pole right renal lesion measures 9 mm and is most consistent with a cyst.  Corresponding to the CT abnormality, 1.5 cm inter/lower pole left renal lesion demonstrates T2 hypo intensity on image 42 of series 3 and post-contrast enhancement, including on image 58 of series 13002. No hydronephrosis.  Vascular/Lymphatic: Atherosclerotic irregularity in the aorta and branch vessels. No retroperitoneal adenopathy. Left renal vein patent.  Musculoskeletal: No focal osseous abnormality.  Other:  No ascites.  IMPRESSION: 1. Mildly motion degraded exam. 2. Filling defect within the distal common duct, highly suspicious for periampullary stone. This is positioned more distally than on 08/12/14 CT. Mild secondary biliary ductal dilatation. 3. 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 4. Progressive moderate pancreatitis.   Electronically Signed   By: Abigail Miyamoto M.D.   On: 08/14/2014 10:21   Mr Jeananne Rama W/wo Cm/mrcp  08/14/2014   CLINICAL DATA:  Dilated common bile duct on CT. Renal mass. Elevated liver function tests.  EXAM: MRI ABDOMEN WITHOUT AND WITH CONTRAST (INCLUDING MRCP)  TECHNIQUE: Multiplanar multisequence  MR imaging of the abdomen was performed both before and after the administration of intravenous contrast. Heavily T2-weighted images of the biliary and pancreatic ducts were obtained, and three-dimensional MRCP images were rendered by post processing.  CONTRAST:  38mL MULTIHANCE GADOBENATE DIMEGLUMINE 529 MG/ML IV SOLN  COMPARISON:  CT of 08/12/14.  FINDINGS: Lower chest: Normal heart size.  Trace bilateral pleural effusions.  Motion degradation throughout the abdomen.  Tiny hiatal hernia.  Hepatobiliary: No focal liver lesion. Minimal intrahepatic biliary ductal dilatation. Normal gallbladder. Common duct mildly dilated, up to  12 mm on image 46 of series 4. Distal common duct filling defect at the level of the ampulla. 9 mm on image 37 of series 4. Image 88 of series 400 and image 27 of series 11. This appears more distally positioned than on the 08/12/2014 CT.  Spleen: Normal  Pancreas: Moderate peripancreatic edema, increased since 08/12/14. Example image 26 of series 3. No pancreatic ductal dilatation or mass. No peripancreatic fluid collection. Diffuse pancreatic enhancement, without evidence of necrosis.  Stomach/Bowel: Normal distal stomach and imaged abdominal bowel loops.  Adrenals/Urinary tract: Normal adrenal glands. 2 right renal lesions. An interpolar lesion measures 6 mm and is too small to entirely characterize. Likely a cyst. A lower pole right renal lesion measures 9 mm and is most consistent with a cyst.  Corresponding to the CT abnormality, 1.5 cm inter/lower pole left renal lesion demonstrates T2 hypo intensity on image 42 of series 3 and post-contrast enhancement, including on image 58 of series 13002. No hydronephrosis.  Vascular/Lymphatic: Atherosclerotic irregularity in the aorta and branch vessels. No retroperitoneal adenopathy. Left renal vein patent.  Musculoskeletal: No focal osseous abnormality.  Other:  No ascites.  IMPRESSION: 1. Mildly motion degraded exam. 2. Filling defect within the distal common duct, highly suspicious for periampullary stone. This is positioned more distally than on 08/12/14 CT. Mild secondary biliary ductal dilatation. 3. 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 4. Progressive moderate pancreatitis.   Electronically Signed   By: Abigail Miyamoto M.D.   On: 08/14/2014 10:21      ERCP 08-16-14  Findings:  The CBD was dilated diffusely up to 28mm and there was a round filling defect in distal bile duct. An adequate biliary sphincterotomy was performed over the biliary wire and then a biliary retrieval balloon was used to sweep  the bile ducts. A single 6mm greenish stone was delivered into the duodenum. There was no purulence. A completion, occlusion cholangiogram showed no further filling defects. The main pancreatic duct was never cannulated or injected with dye.   Impression:  Choledocholiathiasis, treated today with biliary sphincterotomy and balloon sweeping.   Recommendation:  Observe clinically. Can d/c IV antibiotics tomorrow and complete 3 days to twice daily augmentin orally. General surgery should be consulted to consider cholecystectomy        Objective: Filed Vitals:   08/16/14 1030 08/16/14 1045 08/16/14 1054 08/16/14 1449  BP: 170/80 174/80 176/76 179/80  Pulse: 79 72 74 67  Temp:  98.3 F (36.8 C) 98.2 F (36.8 C) 98.5 F (36.9 C)  TempSrc:    Oral  Resp: 23 21 22 22   Height:      Weight:      SpO2: 92% 92% 92% 92%    Intake/Output Summary (Last 24 hours) at 08/16/14 1626 Last data filed at 08/16/14 1500  Gross per 24 hour  Intake   2990 ml  Output  0 ml  Net   2990 ml   Weight change: 4.838 kg (10 lb 10.6 oz) Exam:   General:  Pt is alert, follows commands appropriately, not in acute distress  HEENT: No icterus, No thrush, [place]/AT  Cardiovascular: RRR, S1/S2, no rubs, no gallops  Respiratory: CTA bilaterally, no wheezing, no crackles, no rhonchi  Abdomen: Soft/+BS, non tender, non distended, no guarding  Extremities: No edema, No lymphangitis, No petechiae, No rashes, no synovitis  Data Reviewed: Basic Metabolic Panel:  Recent Labs Lab 08/12/14 0431 08/13/14 0554 08/14/14 0437 08/16/14 0335  NA 136* 134* 136* 136*  K 4.0 3.8 3.8 3.3*  CL 93* 99 101 102  CO2 24 23 21 20   GLUCOSE 200* 149* 158* 116*  BUN 16 21 14 11   CREATININE 0.91 1.14 0.80 0.72  CALCIUM 9.3 8.2* 8.2* 8.0*   Liver Function Tests:  Recent Labs Lab 08/12/14 0431 08/13/14 0554 08/14/14 0437 08/16/14 0335  AST 65* 29 22 15   ALT 90* 44 33 19  ALKPHOS 187* 123* 123* 110  BILITOT  6.5* 2.4* 2.1* 1.4*  PROT 7.8 5.9* 6.0 5.5*  ALBUMIN 4.0 2.7* 2.6* 2.2*    Recent Labs Lab 08/12/14 0431 08/13/14 0554 08/14/14 0437 08/15/14 0435 08/16/14 0335  LIPASE >3000* 255* 113* 68* 60*   No results found for this basename: AMMONIA,  in the last 168 hours CBC:  Recent Labs Lab 08/12/14 0431 08/13/14 0554 08/14/14 0437 08/16/14 0335  WBC 15.6* 11.0* 10.1 10.5  NEUTROABS 13.9*  --   --   --   HGB 14.0 11.5* 11.6* 10.8*  HCT 41.1 34.3* 34.7* 31.7*  MCV 93.4 95.3 94.6 92.4  PLT 206 124* 120* 126*   Cardiac Enzymes: No results found for this basename: CKTOTAL, CKMB, CKMBINDEX, TROPONINI,  in the last 168 hours BNP: No components found with this basename: POCBNP,  CBG:  Recent Labs Lab 08/15/14 1821 08/15/14 2234 08/16/14 0738 08/16/14 1042 08/16/14 1215  GLUCAP 120* 119* 121* 115* 136*    Recent Results (from the past 240 hour(s))  CULTURE, BLOOD (ROUTINE X 2)     Status: None   Collection Time    08/12/14  9:19 AM      Result Value Ref Range Status   Specimen Description BLOOD RIGHT ARM   Final   Special Requests     Final   Value: BOTTLES DRAWN AEROBIC AND ANAEROBIC 10CC BLUE BOTTLE, 7 CC RED BOTTLE   Culture  Setup Time     Final   Value: 08/12/2014 13:42     Performed at [facility]   Culture     Final   Value:        BLOOD CULTURE RECEIVED NO GROWTH TO DATE CULTURE WILL BE HELD FOR 5 DAYS BEFORE ISSUING A FINAL NEGATIVE REPORT     Performed at [facility]   Report Status PENDING   Incomplete  CULTURE, BLOOD (ROUTINE X 2)     Status: None   Collection Time    08/12/14  9:24 AM      Result Value Ref Range Status   Specimen Description BLOOD RIGHT ARM   Final   Special Requests     Final   Value: BOTTLES DRAWN AEROBIC AND ANAEROBIC 10CC BOTH BOTTLES   Culture  Setup Time     Final   Value: 08/12/2014 13:42     Performed at [facility]   Culture     Final   Value:  BLOOD CULTURE RECEIVED NO GROWTH TO DATE  CULTURE WILL BE HELD FOR 5 DAYS BEFORE ISSUING A FINAL NEGATIVE REPORT     Performed at [facility]   Report Status PENDING   Incomplete  CULTURE, EXPECTORATED SPUTUM-ASSESSMENT     Status: None   Collection Time    08/13/14  1:56 AM      Result Value Ref Range Status   Specimen Description SPUTUM   Final   Special Requests NONE   Final   Sputum evaluation     Final   Value: MICROSCOPIC FINDINGS SUGGEST THAT THIS SPECIMEN IS NOT REPRESENTATIVE OF LOWER RESPIRATORY SECRETIONS. PLEASE RECOLLECT.     Enid Derry AT 0216 ON 09.20.2015 BY NBROOKS   Report Status 08/13/2014 FINAL   Final  CULTURE, EXPECTORATED SPUTUM-ASSESSMENT     Status: None   Collection Time    08/13/14  6:47 AM      Result Value Ref Range Status   Specimen Description SPUTUM   Final   Special Requests NONE   Final   Sputum evaluation     Final   Value: THIS SPECIMEN IS ACCEPTABLE. RESPIRATORY CULTURE REPORT TO FOLLOW.   Report Status 08/13/2014 FINAL   Final  CULTURE, RESPIRATORY (NON-EXPECTORATED)     Status: None   Collection Time    08/13/14  6:47 AM      Result Value Ref Range Status   Specimen Description SPUTUM   Final   Special Requests NONE   Final   Gram Stain     Final   Value: FEW WBC PRESENT, PREDOMINANTLY MONONUCLEAR     RARE SQUAMOUS EPITHELIAL CELLS PRESENT     RARE GRAM POSITIVE COCCI     IN PAIRS IN CLUSTERS RARE GRAM NEGATIVE RODS     Performed at [facility]   Culture     Final   Value: NORMAL OROPHARYNGEAL FLORA     Performed at [facility]   Report Status 08/15/2014 FINAL   Final  URINE CULTURE     Status: None   Collection Time    08/13/14  1:58 PM      Result Value Ref Range Status   Specimen Description URINE, CATHETERIZED   Final   Special Requests NONE   Final   Culture  Setup Time     Final   Value: 08/13/2014 19:11     Performed at [place]     Final   Value: NO GROWTH     Performed at [facility]   Culture      Final   Value: NO GROWTH     Performed at [facility]   Report Status 08/14/2014 FINAL   Final     Scheduled Meds: . ampicillin-sulbactam (UNASYN) IV  1.5 g Intravenous 4 times per day  . aspirin EC  81 mg Oral Daily  . atorvastatin  20 mg Oral q1800  . budesonide-formoterol  2 puff Inhalation BID  . carvedilol  25 mg Oral BID WC  . guaiFENesin  600 mg Oral Daily  . insulin aspart  0-9 Units Subcutaneous TID WC  . ipratropium-albuterol  3 mL Nebulization QID  . losartan  50 mg Oral Daily  . sodium chloride  3 mL Intravenous Q12H   Continuous Infusions: . 0.9 % NaCl with KCl 40 mEq / L 50 mL/hr (08/16/14 0736)     Thurnell Lose, MD  [facility] Pager [phone]  If 7PM-7AM, please  contact night-coverage [email or web address] Password [email or web address] 08/16/2014, 4:26 PM   LOS: 4 days

## 2014-08-16 NOTE — Transfer of Care (Signed)
Immediate Anesthesia Transfer of Care Note  Patient: Jim [name]  Procedure(s) Performed: Procedure(s) (LRB): ENDOSCOPIC RETROGRADE CHOLANGIOPANCREATOGRAPHY (ERCP) (N/A)  Patient Location: PACU  Anesthesia Type: General  Level of Consciousness: sedated, patient cooperative and responds to stimulation  Airway & Oxygen Therapy: Patient Spontanous Breathing and Patient connected to face mask oxgen  Post-op Assessment: Report given to PACU RN and Post -op Vital signs reviewed and stable  Post vital signs: Reviewed and stable  Complications: No apparent anesthesia complications

## 2014-08-16 NOTE — Anesthesia Postprocedure Evaluation (Signed)
  Anesthesia Post-op Note  Patient: Jim [name]  Procedure(s) Performed: Procedure(s) (LRB): ENDOSCOPIC RETROGRADE CHOLANGIOPANCREATOGRAPHY (ERCP) (N/A)  Patient Location: PACU  Anesthesia Type: General  Level of Consciousness: awake and alert   Airway and Oxygen Therapy: Patient Spontanous Breathing  Post-op Pain: mild  Post-op Assessment: Post-op Vital signs reviewed, Patient's Cardiovascular Status Stable, Respiratory Function Stable, Patent Airway and No signs of Nausea or vomiting  Last Vitals:  Filed Vitals:   08/16/14 1045  BP: 174/80  Pulse: 72  Temp: 36.8 C  Resp: 21    Post-op Vital Signs: stable   Complications: No apparent anesthesia complications

## 2014-08-16 NOTE — Op Note (Signed)
[facility] endoscopy writing program not functioning well today and so this will serve as the op note:  ERCP performed in OR under general anesthesia, he was given 100mg  rectal indomethacin immediately after the case.  Findings: The CBD was dilated diffusely up to 24mm and there was a round filling defect in distal bile duct.  An adequate biliary sphincterotomy was performed over the biliary wire and then a biliary retrieval balloon was used to sweep the bile ducts. A single 28mm greenish stone was delivered into the duodenum. There was no purulence. A completion, occlusion cholangiogram showed no further filling defects. The main pancreatic duct was never cannulated or injected with dye.  Impression: Choledocholiathiasis, treated today with biliary sphincterotomy and balloon sweeping.   Recommendation: Observe clinically.  Can d/c IV antibiotics tomorrow and complete 3 days to twice daily augmentin orally.  General surgery should be consulted to consider cholecystectomy

## 2014-08-16 NOTE — H&P (View-Only) (Signed)
[facility] Progress Note  Subjective:  Feels good.  No abdominal pain.    Objective:  Vital signs in last 24 hours: Temp:  [98.2 F (36.8 C)-98.4 F (36.9 C)] 98.2 F (36.8 C) (09/22 0534) Pulse Rate:  [69-87] 86 (09/22 0534) Resp:  [18] 18 (09/22 0534) BP: (142-153)/(67-81) 142/67 mmHg (09/22 0534) SpO2:  [92 %-98 %] 98 % (09/22 0534) Weight:  [172 lb 2.2 oz (78.08 kg)] 172 lb 2.2 oz (78.08 kg) (09/22 0534) Last BM Date: 08/13/14 General:  Alert, Well-developed, in NAD Heart:  Regular rate and rhythm; no murmurs Pulm:  Decreased BS B/L. Abdomen:  Soft, non-distended. Normal bowel sounds.  Non-tender. Extremities:  Without edema. Neurologic:  Alert and  oriented x4;  grossly normal neurologically. Psych:  Alert and cooperative. Normal mood and affect.  Intake/Output from previous day: 09/21 0701 - 09/22 0700 In: 2780 [P.O.:240; I.V.:2340; IV Piggyback:200] Out: -   Lab Results:  Recent Labs  08/13/14 0554 08/14/14 0437  WBC 11.0* 10.1  HGB 11.5* 11.6*  HCT 34.3* 34.7*  PLT 124* 120*   BMET  Recent Labs  08/13/14 0554 08/14/14 0437  NA 134* 136*  K 3.8 3.8  CL 99 101  CO2 23 21  GLUCOSE 149* 158*  BUN 21 14  CREATININE 1.14 0.80  CALCIUM 8.2* 8.2*   LFT  Recent Labs  08/14/14 0437  PROT 6.0  ALBUMIN 2.6*  AST 22  ALT 33  ALKPHOS 123*  BILITOT 2.1*   Dg Chest 2 View  08/13/2014   CLINICAL DATA:  Shortness of breath.  EXAM: CHEST  2 VIEW  COMPARISON:  08/12/2014.  FINDINGS: Normal sized heart. No significant change in bilateral linear densities. Moderate sized hiatal hernia. This contained fat only on the CT dated 06/27/2010. Mild thoracic spine degenerative changes.  IMPRESSION: 1. No acute abnormality. 2. Stable bilateral scarring. 3. Moderate-sized hiatal hernia, previously shown to contain fat only.   Electronically Signed   By: Enrique Sack M.D.   On: 08/13/2014 14:25   Mr 3d Recon At Scanner  08/14/2014   CLINICAL DATA:   Dilated common bile duct on CT. Renal mass. Elevated liver function tests.  EXAM: MRI ABDOMEN WITHOUT AND WITH CONTRAST (INCLUDING MRCP)  TECHNIQUE: Multiplanar multisequence MR imaging of the abdomen was performed both before and after the administration of intravenous contrast. Heavily T2-weighted images of the biliary and pancreatic ducts were obtained, and three-dimensional MRCP images were rendered by post processing.  CONTRAST:  60mL MULTIHANCE GADOBENATE DIMEGLUMINE 529 MG/ML IV SOLN  COMPARISON:  CT of 08/12/14.  FINDINGS: Lower chest: Normal heart size.  Trace bilateral pleural effusions.  Motion degradation throughout the abdomen.  Tiny hiatal hernia.  Hepatobiliary: No focal liver lesion. Minimal intrahepatic biliary ductal dilatation. Normal gallbladder. Common duct mildly dilated, up to 12 mm on image 46 of series 4. Distal common duct filling defect at the level of the ampulla. 9 mm on image 37 of series 4. Image 88 of series 400 and image 27 of series 11. This appears more distally positioned than on the 08/12/2014 CT.  Spleen: Normal  Pancreas: Moderate peripancreatic edema, increased since 08/12/14. Example image 26 of series 3. No pancreatic ductal dilatation or mass. No peripancreatic fluid collection. Diffuse pancreatic enhancement, without evidence of necrosis.  Stomach/Bowel: Normal distal stomach and imaged abdominal bowel loops.  Adrenals/Urinary tract: Normal adrenal glands. 2 right renal lesions. An interpolar lesion measures 6 mm and is too small to entirely characterize.  Likely a cyst. A lower pole right renal lesion measures 9 mm and is most consistent with a cyst.  Corresponding to the CT abnormality, 1.5 cm inter/lower pole left renal lesion demonstrates T2 hypo intensity on image 42 of series 3 and post-contrast enhancement, including on image 58 of series 13002. No hydronephrosis.  Vascular/Lymphatic: Atherosclerotic irregularity in the aorta and branch vessels. No retroperitoneal  adenopathy. Left renal vein patent.  Musculoskeletal: No focal osseous abnormality.  Other:  No ascites.  IMPRESSION: 1. Mildly motion degraded exam. 2. Filling defect within the distal common duct, highly suspicious for periampullary stone. This is positioned more distally than on 08/12/14 CT. Mild secondary biliary ductal dilatation. 3. 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 4. Progressive moderate pancreatitis.   Electronically Signed   By: Abigail Miyamoto M.D.   On: 08/14/2014 10:21   Mr Jeananne Rama W/wo Cm/mrcp  08/14/2014   CLINICAL DATA:  Dilated common bile duct on CT. Renal mass. Elevated liver function tests.  EXAM: MRI ABDOMEN WITHOUT AND WITH CONTRAST (INCLUDING MRCP)  TECHNIQUE: Multiplanar multisequence MR imaging of the abdomen was performed both before and after the administration of intravenous contrast. Heavily T2-weighted images of the biliary and pancreatic ducts were obtained, and three-dimensional MRCP images were rendered by post processing.  CONTRAST:  30mL MULTIHANCE GADOBENATE DIMEGLUMINE 529 MG/ML IV SOLN  COMPARISON:  CT of 08/12/14.  FINDINGS: Lower chest: Normal heart size.  Trace bilateral pleural effusions.  Motion degradation throughout the abdomen.  Tiny hiatal hernia.  Hepatobiliary: No focal liver lesion. Minimal intrahepatic biliary ductal dilatation. Normal gallbladder. Common duct mildly dilated, up to 12 mm on image 46 of series 4. Distal common duct filling defect at the level of the ampulla. 9 mm on image 37 of series 4. Image 88 of series 400 and image 27 of series 11. This appears more distally positioned than on the 08/12/2014 CT.  Spleen: Normal  Pancreas: Moderate peripancreatic edema, increased since 08/12/14. Example image 26 of series 3. No pancreatic ductal dilatation or mass. No peripancreatic fluid collection. Diffuse pancreatic enhancement, without evidence of necrosis.  Stomach/Bowel: Normal distal stomach  and imaged abdominal bowel loops.  Adrenals/Urinary tract: Normal adrenal glands. 2 right renal lesions. An interpolar lesion measures 6 mm and is too small to entirely characterize. Likely a cyst. A lower pole right renal lesion measures 9 mm and is most consistent with a cyst.  Corresponding to the CT abnormality, 1.5 cm inter/lower pole left renal lesion demonstrates T2 hypo intensity on image 42 of series 3 and post-contrast enhancement, including on image 58 of series 13002. No hydronephrosis.  Vascular/Lymphatic: Atherosclerotic irregularity in the aorta and branch vessels. No retroperitoneal adenopathy. Left renal vein patent.  Musculoskeletal: No focal osseous abnormality.  Other:  No ascites.  IMPRESSION: 1. Mildly motion degraded exam. 2. Filling defect within the distal common duct, highly suspicious for periampullary stone. This is positioned more distally than on 08/12/14 CT. Mild secondary biliary ductal dilatation. 3. 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 4. Progressive moderate pancreatitis.   Electronically Signed   By: Abigail Miyamoto M.D.   On: 08/14/2014 10:21    Assessment / Plan: #1 80 yo male with acute biliary pancreatitis- stable and improving- MRCP showed filling defect in distal CBD highly suspicious for periampullary stone.  Clear liquid diet for now then NPO after midnight.  #2 Renal mass noted on CT  concerning for renal cell CA  #3 COPD:  On O2 at home.  #4 AODM   -Will plan for ERCP on 9/23 at 9 am. Already on Unasyn.   LOS: 3 days   ZEHR, JESSICA D.  08/15/2014, 9:15 AM  Pager number [phone]   ________________________________________________________________________  Velora Heckler GI MD note:  I personally examined the patient, reviewed the data and agree with the assessment and plan described above.  ERCP tomorrow.  Holding lovenox for now.   Owens Loffler, MD [facility] Pager [phone]

## 2014-08-17 ENCOUNTER — Encounter (HOSPITAL_COMMUNITY): Payer: Self-pay | Admitting: Gastroenterology

## 2014-08-17 LAB — COMPREHENSIVE METABOLIC PANEL
ALT: 18 U/L (ref 0–53)
AST: 20 U/L (ref 0–37)
Albumin: 2.2 g/dL — ABNORMAL LOW (ref 3.5–5.2)
Alkaline Phosphatase: 105 U/L (ref 39–117)
Anion gap: 12 (ref 5–15)
BILIRUBIN TOTAL: 1.3 mg/dL — AB (ref 0.3–1.2)
BUN: 13 mg/dL (ref 6–23)
CO2: 21 meq/L (ref 19–32)
Calcium: 8.1 mg/dL — ABNORMAL LOW (ref 8.4–10.5)
Chloride: 105 mEq/L (ref 96–112)
Creatinine, Ser: 0.75 mg/dL (ref 0.50–1.35)
GFR calc Af Amer: >90 mL/min (ref >90)
GFR, EST NON AFRICAN AMERICAN: 84 mL/min — AB (ref >90)
GLUCOSE: 129 mg/dL — AB (ref 70–99)
Potassium: 3.9 mEq/L (ref 3.7–5.3)
SODIUM: 138 meq/L (ref 137–147)
Total Protein: 5.7 g/dL — ABNORMAL LOW (ref 6.0–8.3)

## 2014-08-17 LAB — CBC
HEMATOCRIT: 32.6 % — AB (ref 39.0–52.0)
Hemoglobin: 11.4 g/dL — ABNORMAL LOW (ref 13.0–17.0)
MCH: 31.8 pg (ref 26.0–34.0)
MCHC: 35 g/dL (ref 30.0–36.0)
MCV: 90.8 fL (ref 78.0–100.0)
Platelets: 133 10*3/uL — ABNORMAL LOW (ref 150–400)
RBC: 3.59 MIL/uL — AB (ref 4.22–5.81)
RDW: 12.7 % (ref 11.5–15.5)
WBC: 10.3 10*3/uL (ref 4.0–10.5)

## 2014-08-17 LAB — GLUCOSE, CAPILLARY
Glucose-Capillary: 140 mg/dL — ABNORMAL HIGH (ref 70–99)
Glucose-Capillary: 126 mg/dL — ABNORMAL HIGH (ref 70–99)
Glucose-Capillary: 168 mg/dL — ABNORMAL HIGH (ref 70–99)
Glucose-Capillary: 200 mg/dL — ABNORMAL HIGH (ref 70–99)
Glucose-Capillary: 172 mg/dL — ABNORMAL HIGH (ref 70–99)

## 2014-08-17 LAB — LIPASE, BLOOD: LIPASE: 65 U/L — AB (ref 11–59)

## 2014-08-17 MED ORDER — SODIUM CHLORIDE 0.9 % IV SOLN
1.5000 g | INTRAVENOUS | Status: DC
  Filled 2014-08-17: qty 1.5

## 2014-08-17 MED ORDER — POTASSIUM CHLORIDE IN NACL 40-0.9 MEQ/L-% IV SOLN
INTRAVENOUS | Status: AC
  Administered 2014-08-17: 50 mL/h via INTRAVENOUS
  Filled 2014-08-17: qty 1000

## 2014-08-17 MED ORDER — CHLORHEXIDINE GLUCONATE 4 % EX LIQD
1.0000 "application " | Freq: Once | CUTANEOUS | Status: DC
  Filled 2014-08-17: qty 15

## 2014-08-17 MED ORDER — CHLORHEXIDINE GLUCONATE 4 % EX LIQD
1.0000 "application " | Freq: Once | CUTANEOUS | Status: AC
  Administered 2014-08-17: 1 via TOPICAL
  Filled 2014-08-17: qty 15

## 2014-08-17 NOTE — Progress Notes (Signed)
PROGRESS NOTE    Jim [name] HQP:[id number] DOB: July 26, 1932 DOA: 08/12/2014 PCP: [name], MD   Interim summary   80 year old male with past medical history of hypertension, dyslipidemia, diabetes, chronic diastolic CHF, history of prostate cancer who presented to [facility] ED 08/12/2014 with worsening abdominal pain over past 24 hours prior to this admission. Patient explains pain is in the upper and mid abdomen associated with multiple episodes of nonbloody vomiting. In emergency department, the patient was noted to have a lipase >3000, AST 65, ALT 90, ALT 187, total bilirubin 6.5. BNP was 772, WBC 15.6. CT abdomen showed focal midline retroperitoneal inflammation unclear if this is sequela of duodenitis or possible pancreatitis, possible mid to proximal small bowel wall thickening, non-obstructing sub-centimeters distal common bile duct lesion which could reflect polyp or less likely neoplasm, 16 mm solid left lower pole renal mass highly concerning for renal cell carcinoma.   The patient was placed on bowel rest and started on intravenous fluids for his acute pancreatitis. Gastroenterology was consulted. MRCP was performed as well as MRI of the abdomen to clarify his left renal mass. The MRCP revealed a distal common bile duct filling defect concerning for a stone. MRI of the abdomen revealed a suspicious left renal lesion concerning for renal cell carcinoma. Urology has been consulted, and felt due to slow growing nature of these masses, pt can follow up as an outpatient. Gastroenterology plans for ERCP on 08/16/2014. The patient continues on Unasyn for empiric treatment of aspiration pneumonitis and possible cholangitis.   Subjective:  Patient denies fevers, chills, headache, chest pain, dyspnea, nausea, vomiting, diarrhea, abdominal pain, dysuria, hematuria   Assessment/Plan:    Acute on chronic  respiratory failure  -Likely multifactorial including the patient's COPD and possible aspiration pneumonitis due to N&V (CAP) -Patient is supposed to be on oxygen at home (2.5L), but only uses it when necessary  -improved on ABX along with o2 and Nebs     Acute pancreatitis with CBD stone  --LFTs, lipase improving  --MRCP--distant, bowel duct filling defect suspicious for periampullary stone  -ERCP done on 08/16/14 shows Choledocholiathiasis, treated with biliary sphincterotomy and balloon sweeping.  -continue clear liquids & IVF - CCS consulted , and a cholecystectomy  - Monitor cultures, blood culture from 919 growing gram-negative rods.     Fever  -Likely related to pancreatitis/cholangitis/aspiration pneumonitis , kindly see treatment as above     Renal mass  -MRI of the kidneys--suspicious for RCC  -Appreciate urology consult-->followup with Dr. Consuella Lose as outpatient    Chronic diastolic CHF EF 59%  -Appears euvolemic  -continue carvedilol  -saline lock IVF when pt able to tolerate diet    COPD  -Stable  -Restarted long-acting beta agonist  -Continue aerosolized albuterol and Atrovent     Hypertension  -Discontinue losartan as the patient's blood pressure soft  -Continue carvedilol     Diabetes mellitus type 2  -08/12/2014 hemoglobin A1c 7.0 , Discontinue metformin on ISS  CBG (last 3)   Recent Labs  08/16/14 1654 08/16/14 2158 08/17/14 0745  GLUCAP 138* 140* 126*         Family Communication: Patient's niece updated bedside  Disposition Plan: Home  With home health PT  Consults - urology, GI, CCS   Antibiotics:   Levofloxacin 9/19>>9/20  unasyn 08/13/14>>>     Procedures/Studies: Dg Chest 2 View  08/13/2014   CLINICAL DATA:  Shortness of breath.  EXAM: CHEST  2 VIEW  COMPARISON:  08/12/2014.  FINDINGS: Normal sized heart. No significant change in bilateral linear densities. Moderate sized hiatal hernia. This contained fat only  on the CT dated 06/27/2010. Mild thoracic spine degenerative changes.  IMPRESSION: 1. No acute abnormality. 2. Stable bilateral scarring. 3. Moderate-sized hiatal hernia, previously shown to contain fat only.   Electronically Signed   By: Enrique Sack M.D.   On: 08/13/2014 14:25   Dg Chest 2 View  08/12/2014   CLINICAL DATA:  Severe shortness of breath. Dyspnea. Abdominal pain and bloating.  EXAM: CHEST  2 VIEW  COMPARISON:  Chest radiograph performed 12/23/2010  FINDINGS: The lungs are well-aerated. Right mid lung scarring is noted. Minimal left basilar opacity may reflect atelectasis or possibly mild pneumonia, given the patient's symptoms. Mild vascular congestion is noted. There is no evidence of pleural effusion or pneumothorax.  The heart is normal in size; the mediastinal contour is within normal limits. No acute osseous abnormalities are seen.  IMPRESSION: Right mid lung scarring again noted; minimal left basilar airspace opacity may reflect atelectasis or possibly mild pneumonia, given the patient's symptoms. Mild vascular congestion noted.   Electronically Signed   By: Garald Balding M.D.   On: 08/12/2014 05:22   Ct Abdomen Pelvis W Contrast  08/12/2014   CLINICAL DATA:  Severe shortness of breath and severe abdominal pain.  EXAM: CT ABDOMEN AND PELVIS WITH CONTRAST  TECHNIQUE: Multidetector CT imaging of the abdomen and pelvis was performed using the standard protocol following bolus administration of intravenous contrast.  CONTRAST:  62mL OMNIPAQUE IOHEXOL 300 MG/ML SOLN, 126mL OMNIPAQUE IOHEXOL 300 MG/ML SOLN  COMPARISON:  Abdominal ultrasound November 10, 2013  FINDINGS: LUNG BASES: Limited view of the lung bases demonstrates severe centrilobular emphysema. Dependent atelectasis. Heart size is normal. Coronary artery calcifications. Included pericardium is unremarkable.  SOLID ORGANS: The liver, spleen, gallbladder, and adrenal glands are unremarkable. Focal midline retroperitoneal inflammation,  contiguous with the inferior margin of the pancreas and, ligaments of Treitz.  GASTROINTESTINAL TRACT: Small hiatal hernia, with under amount of surrounding mildly inflamed fat. The stomach, small and large bowel are normal in course and caliber without inflammatory changes. Suspected proximal small bowel wall thickening. Mild colonic diverticulosis. Normal appendix.  KIDNEYS/ URINARY TRACT: Kidneys are orthotopic, demonstrating symmetric enhancement. Solid enhancing LEFT lower pole 16 mm mass with mild washout on delayed phase. No nephrolithiasis, hydronephrosis. 10 mm RIGHT lower pole cysts. Too small to characterize RIGHT interpolar hypodensity. The unopacified ureters are normal in course and caliber. Delayed imaging through the kidneys demonstrates symmetric prompt contrast excretion within the proximal urinary collecting system. Urinary bladder is partially distended and unremarkable.  PERITONEUM/RETROPERITONEUM: The Common bile duct is not enlarged, however there is a 5 mm soft tissue lesion along the posterior wall of the distal Common bile duct, 51/101 and 50/119. No intraperitoneal free fluid nor free air. Aortoiliac vessels are normal in course and caliber, moderate calcific atherosclerosis. No lymphadenopathy by CT size criteria. Prostate brachytherapy seeds. Partially imaged small LEFT hydrocele.  SOFT TISSUE/OSSEOUS STRUCTURES: Nonsuspicious. Grade 1 L4-5 anterolisthesis on spondylotic basis.  IMPRESSION: Focal midline retroperitoneal inflammation, unclear if this is reflects sequelae duodenitis or possible pancreatitis. Suspected mild proximal small bowel wall thickening.  Nonobstructing subcentimeter distal Common bile duct lesion, which could reflect polyp or less likely neoplasm, possible sludge ball. Recommend MRCP on a nonemergent basis.  Small hiatal hernia with moderate amount of associated mildly inflamed fat, which is possibly tracking from the retroperitoneum.  16 mm solid LEFT lower pole  renal mass highly concerning for renal cell carcinoma. Recommend further characterization with MRI, biopsy or surgery. This recommendation follows ACR consensus guidelines: Managing Incidental Findings on Abdominal CT: White Paper of the ACR Incidental Findings Committee. Joellyn Rued Radiol [phone]   Electronically Signed   By: Elon Alas   On: 08/12/2014 06:58   Mr 3d Recon At Scanner  08/14/2014   CLINICAL DATA:  Dilated common bile duct on CT. Renal mass. Elevated liver function tests.  EXAM: MRI ABDOMEN WITHOUT AND WITH CONTRAST (INCLUDING MRCP)  TECHNIQUE: Multiplanar multisequence MR imaging of the abdomen was performed both before and after the administration of intravenous contrast. Heavily T2-weighted images of the biliary and pancreatic ducts were obtained, and three-dimensional MRCP images were rendered by post processing.  CONTRAST:  6mL MULTIHANCE GADOBENATE DIMEGLUMINE 529 MG/ML IV SOLN  COMPARISON:  CT of 08/12/14.  FINDINGS: Lower chest: Normal heart size.  Trace bilateral pleural effusions.  Motion degradation throughout the abdomen.  Tiny hiatal hernia.  Hepatobiliary: No focal liver lesion. Minimal intrahepatic biliary ductal dilatation. Normal gallbladder. Common duct mildly dilated, up to 12 mm on image 46 of series 4. Distal common duct filling defect at the level of the ampulla. 9 mm on image 37 of series 4. Image 88 of series 400 and image 27 of series 11. This appears more distally positioned than on the 08/12/2014 CT.  Spleen: Normal  Pancreas: Moderate peripancreatic edema, increased since 08/12/14. Example image 26 of series 3. No pancreatic ductal dilatation or mass. No peripancreatic fluid collection. Diffuse pancreatic enhancement, without evidence of necrosis.  Stomach/Bowel: Normal distal stomach and imaged abdominal bowel loops.  Adrenals/Urinary tract: Normal adrenal glands. 2 right renal lesions. An interpolar lesion measures 6 mm and is too small to entirely  characterize. Likely a cyst. A lower pole right renal lesion measures 9 mm and is most consistent with a cyst.  Corresponding to the CT abnormality, 1.5 cm inter/lower pole left renal lesion demonstrates T2 hypo intensity on image 42 of series 3 and post-contrast enhancement, including on image 58 of series 13002. No hydronephrosis.  Vascular/Lymphatic: Atherosclerotic irregularity in the aorta and branch vessels. No retroperitoneal adenopathy. Left renal vein patent.  Musculoskeletal: No focal osseous abnormality.  Other:  No ascites.  IMPRESSION: 1. Mildly motion degraded exam. 2. Filling defect within the distal common duct, highly suspicious for periampullary stone. This is positioned more distally than on 08/12/14 CT. Mild secondary biliary ductal dilatation. 3. 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 4. Progressive moderate pancreatitis.   Electronically Signed   By: Abigail Miyamoto M.D.   On: 08/14/2014 10:21   Mr Jeananne Rama W/wo Cm/mrcp  08/14/2014   CLINICAL DATA:  Dilated common bile duct on CT. Renal mass. Elevated liver function tests.  EXAM: MRI ABDOMEN WITHOUT AND WITH CONTRAST (INCLUDING MRCP)  TECHNIQUE: Multiplanar multisequence MR imaging of the abdomen was performed both before and after the administration of intravenous contrast. Heavily  T2-weighted images of the biliary and pancreatic ducts were obtained, and three-dimensional MRCP images were rendered by post processing.  CONTRAST:  52mL MULTIHANCE GADOBENATE DIMEGLUMINE 529 MG/ML IV SOLN  COMPARISON:  CT of 08/12/14.  FINDINGS: Lower chest: Normal heart size.  Trace bilateral pleural effusions.  Motion degradation throughout the abdomen.  Tiny hiatal hernia.  Hepatobiliary: No focal liver lesion. Minimal intrahepatic biliary ductal dilatation. Normal gallbladder. Common duct mildly dilated, up to 12 mm on image 46 of series 4. Distal common duct filling defect at the level of the ampulla. 9  mm on image 37 of series 4. Image 88 of series 400 and image 27 of series 11. This appears more distally positioned than on the 08/12/2014 CT.  Spleen: Normal  Pancreas: Moderate peripancreatic edema, increased since 08/12/14. Example image 26 of series 3. No pancreatic ductal dilatation or mass. No peripancreatic fluid collection. Diffuse pancreatic enhancement, without evidence of necrosis.  Stomach/Bowel: Normal distal stomach and imaged abdominal bowel loops.  Adrenals/Urinary tract: Normal adrenal glands. 2 right renal lesions. An interpolar lesion measures 6 mm and is too small to entirely characterize. Likely a cyst. A lower pole right renal lesion measures 9 mm and is most consistent with a cyst.  Corresponding to the CT abnormality, 1.5 cm inter/lower pole left renal lesion demonstrates T2 hypo intensity on image 42 of series 3 and post-contrast enhancement, including on image 58 of series 13002. No hydronephrosis.  Vascular/Lymphatic: Atherosclerotic irregularity in the aorta and branch vessels. No retroperitoneal adenopathy. Left renal vein patent.  Musculoskeletal: No focal osseous abnormality.  Other:  No ascites.  IMPRESSION: 1. Mildly motion degraded exam. 2. Filling defect within the distal common duct, highly suspicious for periampullary stone. This is positioned more distally than on 08/12/14 CT. Mild secondary biliary ductal dilatation. 3. 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 4. Progressive moderate pancreatitis.   Electronically Signed   By: Abigail Miyamoto M.D.   On: 08/14/2014 10:21         ERCP 08-16-14  Findings:  The CBD was dilated diffusely up to 60mm and there was a round filling defect in distal bile duct. An adequate biliary sphincterotomy was performed over the biliary wire and then a biliary retrieval balloon was used to sweep the bile ducts. A single 49mm greenish stone was delivered into the duodenum. There was no  purulence. A completion, occlusion cholangiogram showed no further filling defects. The main pancreatic duct was never cannulated or injected with dye.     Impression:  Choledocholiathiasis, treated today with biliary sphincterotomy and balloon sweeping.     Recommendation:  Observe clinically. Can d/c IV antibiotics tomorrow and complete 3 days to twice daily augmentin orally. General surgery should be consulted to consider cholecystectomy        Objective: Filed Vitals:   08/16/14 1937 08/16/14 2156 08/17/14 0530 08/17/14 0745  BP:  138/74 175/74   Pulse:  71 78 74  Temp:  98.3 F (36.8 C) 98.2 F (36.8 C)   TempSrc:  Oral Oral   Resp:  21 21 20   Height:      Weight:   82.921 kg (182 lb 12.9 oz)   SpO2: 90% 91% 98% 91%    Intake/Output Summary (Last 24 hours) at 08/17/14 1042 Last data filed at 08/16/14 2200  Gross per 24 hour  Intake   1140 ml  Output      0 ml  Net  1140 ml   Weight change: 0.003 kg (0.1 oz)   Exam:   General:  Pt is alert, follows commands appropriately, not in acute distress  HEENT: No icterus, No thrush, [place]/AT  Cardiovascular: RRR, S1/S2, no rubs, no gallops  Respiratory: CTA bilaterally, no wheezing, no crackles, no rhonchi  Abdomen: Soft/+BS, non tender, non distended, no guarding  Extremities: No edema, No lymphangitis, No petechiae, No rashes, no synovitis   Data Reviewed: Basic Metabolic Panel:  Recent Labs Lab 08/12/14 0431 08/13/14 0554 08/14/14 0437 08/16/14 0335 08/17/14 0507  NA 136* 134* 136* 136* 138  K 4.0 3.8 3.8 3.3* 3.9  CL 93* 99 101 102 105  CO2 24 23 21 20 21   GLUCOSE 200* 149* 158* 116* 129*  BUN 16 21 14 11 13   CREATININE 0.91 1.14 0.80 0.72 0.75  CALCIUM 9.3 8.2* 8.2* 8.0* 8.1*   Liver Function Tests:  Recent Labs Lab 08/12/14 0431 08/13/14 0554 08/14/14 0437 08/16/14 0335 08/17/14 0507  AST 65* 29 22 15 20   ALT 90* 44 33 19 18  ALKPHOS 187* 123* 123* 110 105  BILITOT 6.5* 2.4* 2.1*  1.4* 1.3*  PROT 7.8 5.9* 6.0 5.5* 5.7*  ALBUMIN 4.0 2.7* 2.6* 2.2* 2.2*    Recent Labs Lab 08/13/14 0554 08/14/14 0437 08/15/14 0435 08/16/14 0335 08/17/14 0507  LIPASE 255* 113* 68* 60* 65*   No results found for this basename: AMMONIA,  in the last 168 hours CBC:  Recent Labs Lab 08/12/14 0431 08/13/14 0554 08/14/14 0437 08/16/14 0335 08/17/14 0507  WBC 15.6* 11.0* 10.1 10.5 10.3  NEUTROABS 13.9*  --   --   --   --   HGB 14.0 11.5* 11.6* 10.8* 11.4*  HCT 41.1 34.3* 34.7* 31.7* 32.6*  MCV 93.4 95.3 94.6 92.4 90.8  PLT 206 124* 120* 126* 133*   Cardiac Enzymes: No results found for this basename: CKTOTAL, CKMB, CKMBINDEX, TROPONINI,  in the last 168 hours BNP: No components found with this basename: POCBNP,  CBG:  Recent Labs Lab 08/16/14 1042 08/16/14 1215 08/16/14 1654 08/16/14 2158 08/17/14 0745  GLUCAP 115* 136* 138* 140* 126*    Recent Results (from the past 240 hour(s))  CULTURE, BLOOD (ROUTINE X 2)     Status: None   Collection Time    08/12/14  9:19 AM      Result Value Ref Range Status   Specimen Description BLOOD RIGHT ARM   Final   Special Requests     Final   Value: BOTTLES DRAWN AEROBIC AND ANAEROBIC 10CC BLUE BOTTLE, 7 CC RED BOTTLE   Culture  Setup Time     Final   Value: 08/12/2014 13:42     Performed at [facility]   Culture     Final   Value:        BLOOD CULTURE RECEIVED NO GROWTH TO DATE CULTURE WILL BE HELD FOR 5 DAYS BEFORE ISSUING A FINAL NEGATIVE REPORT     Performed at [facility]   Report Status PENDING   Incomplete  CULTURE, BLOOD (ROUTINE X 2)     Status: None   Collection Time    08/12/14  9:24 AM      Result Value Ref Range Status   Specimen Description BLOOD RIGHT ARM   Final   Special Requests     Final   Value: BOTTLES DRAWN AEROBIC AND ANAEROBIC 10CC BOTH BOTTLES   Culture  Setup Time     Final   Value:  08/12/2014 13:42     Performed at [facility]   Culture     Final   Value: GRAM  NEGATIVE RODS     Note: Gram Stain Report Called to,Read Back By and Verified With: LEEANNE REINERT ON 08/16/2014 AT 11:55P BY WILEJ     Performed at [facility]   Report Status PENDING   Incomplete  CULTURE, EXPECTORATED SPUTUM-ASSESSMENT     Status: None   Collection Time    08/13/14  1:56 AM      Result Value Ref Range Status   Specimen Description SPUTUM   Final   Special Requests NONE   Final   Sputum evaluation     Final   Value: MICROSCOPIC FINDINGS SUGGEST THAT THIS SPECIMEN IS NOT REPRESENTATIVE OF LOWER RESPIRATORY SECRETIONS. PLEASE RECOLLECT.     Enid Derry AT 0216 ON 09.20.2015 BY NBROOKS   Report Status 08/13/2014 FINAL   Final  CULTURE, EXPECTORATED SPUTUM-ASSESSMENT     Status: None   Collection Time    08/13/14  6:47 AM      Result Value Ref Range Status   Specimen Description SPUTUM   Final   Special Requests NONE   Final   Sputum evaluation     Final   Value: THIS SPECIMEN IS ACCEPTABLE. RESPIRATORY CULTURE REPORT TO FOLLOW.   Report Status 08/13/2014 FINAL   Final  CULTURE, RESPIRATORY (NON-EXPECTORATED)     Status: None   Collection Time    08/13/14  6:47 AM      Result Value Ref Range Status   Specimen Description SPUTUM   Final   Special Requests NONE   Final   Gram Stain     Final   Value: FEW WBC PRESENT, PREDOMINANTLY MONONUCLEAR     RARE SQUAMOUS EPITHELIAL CELLS PRESENT     RARE GRAM POSITIVE COCCI     IN PAIRS IN CLUSTERS RARE GRAM NEGATIVE RODS     Performed at [facility]   Culture     Final   Value: NORMAL OROPHARYNGEAL FLORA     Performed at [facility]   Report Status 08/15/2014 FINAL   Final  URINE CULTURE     Status: None   Collection Time    08/13/14  1:58 PM      Result Value Ref Range Status   Specimen Description URINE, CATHETERIZED   Final   Special Requests NONE   Final   Culture  Setup Time     Final   Value: 08/13/2014 19:11     Performed at [place]     Final   Value: NO  GROWTH     Performed at [facility]   Culture     Final   Value: NO GROWTH     Performed at [facility]   Report Status 08/14/2014 FINAL   Final     Scheduled Meds: . ampicillin-sulbactam (UNASYN) IV  1.5 g Intravenous 4 times per day  . aspirin EC  81 mg Oral Daily  . atorvastatin  20 mg Oral q1800  . budesonide-formoterol  2 puff Inhalation BID  . carvedilol  25 mg Oral BID WC  . guaiFENesin  600 mg Oral Daily  . insulin aspart  0-9 Units Subcutaneous TID WC  . ipratropium-albuterol  3 mL Nebulization QID  . losartan  50 mg Oral Daily  . sodium chloride  3 mL Intravenous Q12H   Continuous Infusions: . 0.9 %  NaCl with KCl 40 mEq / L 10 mL/hr (08/17/14 0818)     Thurnell Lose, MD  [facility] Pager [phone]  If 7PM-7AM, please contact night-coverage [email or web address] Password [email or web address] 08/17/2014, 10:42 AM   LOS: 5 days

## 2014-08-17 NOTE — Progress Notes (Signed)
Physical Therapy Treatment Patient Details Name: Jim [name] MRN: [id number] DOB: 02/03/1932 Today's Date: 08/17/2014    History of Present Illness Pt is an 80 year old male admitted with abdominal pain, shortness of breath--pancreatitis and 16 mm solid left lower pole renal mass ? CA . PMHx:HTN, DM, CHF, prostrate CA.  Plans per chart for ERCP on 9/23 at 9 am    PT Comments    Pt sitting up in recliner on 2 lts O2.  States [employer] supplies and he has been using for "quit some time".  Assisted with amb in hallway HHA no AD.  Pt required one long sitting rest break between walks.  Progressing well and plans to go home with son who can help if needed.   Follow Up Recommendations  Home health PT (will have sone to help)     Equipment Recommendations  None recommended by PT    Recommendations for Other Services       Precautions / Restrictions Precautions Precaution Comments: O2 drops with ambulation--pt has O2 at home, but says he only uses it when he feels he needs it. Restrictions Weight Bearing Restrictions: No    Mobility  Bed Mobility Overal bed mobility: Independent                Transfers Overall transfer level: Needs assistance Equipment used: None Transfers: Sit to/from Stand;Stand Pivot Transfers Sit to Stand: Supervision Stand pivot transfers: Supervision       General transfer comment: good safety cognition and use of hands.  Slightly unsteady due to inactivity with long length of stay.  Ambulation/Gait Ambulation/Gait assistance: Supervision;Min guard Ambulation Distance (Feet): 260 Feet (130 feet x 2 one sitting rest break) Assistive device: None Gait Pattern/deviations: Step-through pattern;Staggering left;Staggering right Gait velocity: WFL   General Gait Details: slight lateral stagger instability mainly due to inactivity with long length of stay.  Amb on 2 lts sats ranged from 92% at best and decreased to 82%.  Pt demon 2/4 DOE and  required one long sitting rest break.     Stairs            Wheelchair Mobility    Modified Rankin (Stroke Patients Only)       Balance                                    Cognition                            Exercises      General Comments        Pertinent Vitals/Pain      Home Living                      Prior Function            PT Goals (current goals can now be found in the care plan section) Progress towards PT goals: Progressing toward goals    Frequency  Min 3X/week    PT Plan      Co-evaluation             End of Session Equipment Utilized During Treatment: Gait belt;Oxygen Activity Tolerance: Patient limited by fatigue Patient left: in bed;with call bell/phone within reach     Time: 1505-1530 PT Time Calculation (min): 25 min  Charges:  $Gait Training: 8-22 mins $Therapeutic Activity: 8-22  mins                    G Codes:      Rica Koyanagi  PTA [facility]  Acute  Rehab Pager      [phone]

## 2014-08-17 NOTE — Progress Notes (Signed)
Jim [name]    Since last GI [name]: ERCP yesterday with sphinctertomy and removal of CBD stone, see full report in EPIC.  Lipase 65 this AM (was >3,000 5 days ago when he presented with biliary pancreatitis).  He feels well, is hungry (currently NPO).  NO abd pains.  Objective: Vital signs in last 24 hours: Temp:  [98.2 F (36.8 C)-98.5 F (36.9 C)] 98.2 F (36.8 C) (09/24 0530) Pulse Rate:  [67-86] 78 (09/24 0530) Resp:  [20-23] 21 (09/24 0530) BP: (124-188)/(65-88) 175/74 mmHg (09/24 0530) SpO2:  [90 %-98 %] 98 % (09/24 0530) Weight:  [182 lb 12.9 oz (82.921 kg)] 182 lb 12.9 oz (82.921 kg) (09/24 0530) Last BM Date: 08/13/14 General: alert and oriented times 3 Heart: regular rate and rythm Abdomen: soft, non-tender, non-distended, normal bowel sounds   Lab Results:  Recent Labs  08/16/14 0335 08/17/14 0507  WBC 10.5 10.3  HGB 10.8* 11.4*  PLT 126* 133*  MCV 92.4 90.8    Recent Labs  08/16/14 0335 08/17/14 0507  NA 136* 138  K 3.3* 3.9  CL 102 105  CO2 20 21  GLUCOSE 116* 129*  BUN 11 13  CREATININE 0.72 0.75  CALCIUM 8.0* 8.1*    Recent Labs  08/16/14 0335 08/17/14 0507  PROT 5.5* 5.7*  ALBUMIN 2.2* 2.2*  AST 15 20  ALT 19 18  ALKPHOS 110 105  BILITOT 1.4* 1.3*   Studies/Results: Dg Ercp Biliary & Pancreatic Ducts  08/16/2014   CLINICAL DATA:  Choledocholithiasis  EXAM: ERCP with balloon sweep and sphincterotomy  TECHNIQUE: Multiple spot images obtained with the fluoroscopic device and submitted for interpretation post-procedure.  COMPARISON:  08/14/2014  FINDINGS: Limited spot fluoroscopic views during ERCP demonstrate cannulation, guidewire access and contrast injection of the common bile duct. Mild CBD dilatation. Balloon sweep of the common bile duct performed. Distal CBD filling defect initially noted. Apparently the procedure was successful at stone removal. Please refer to the operative [name] for details of stone  removal.  FLUOROSCOPY TIME:  3.3 min  IMPRESSION: Limited imaging during ERCP with balloon sweep and sphincterotomy for stone removal.  These images were submitted for radiologic interpretation only. Please see the procedural report for the amount of contrast and the fluoroscopy time utilized.   Electronically Signed   By: Daryll Brod M.D.   On: 08/16/2014 10:41     Medications: Scheduled Meds: . ampicillin-sulbactam (UNASYN) IV  1.5 g Intravenous 4 times per day  . aspirin EC  81 mg Oral Daily  . atorvastatin  20 mg Oral q1800  . budesonide-formoterol  2 puff Inhalation BID  . carvedilol  25 mg Oral BID WC  . guaiFENesin  600 mg Oral Daily  . insulin aspart  0-9 Units Subcutaneous TID WC  . ipratropium-albuterol  3 mL Nebulization QID  . losartan  50 mg Oral Daily  . sodium chloride  3 mL Intravenous Q12H   Continuous Infusions:  PRN Meds:.acetaminophen, ipratropium-albuterol, morphine injection, ondansetron (ZOFRAN) IV    Assessment/Plan: 80 y.o. male with biliary pancreatitis, resolved, s/p ERCP yesterday with removal of bile duct stone  General surgery consult to consider cholecystectomy.  Will keep NPO for now. Please call or page with any further questions or concerns.    Jim Banister, MD  08/17/2014, 7:29 AM [facility] Pager [phone]

## 2014-08-17 NOTE — Consult Note (Signed)
Jim [name] 11-Nov-1932  [id number].   Primary Care MD: Dr. [name] Primary Pulmonologist: Dr. Danton Sewer Requesting MD: Dr. Lala Lund Chief Complaint/Reason for Consult: gallstone pancreatitis HPI: This is an 80 yo white male with multiple medical problems including, Gold B COPD, DM, HTN, and prostate cancer.  Last week he acutely began having abdominal pain with nausea and vomiting.  He presented to [facility] where he was admitted and found to have gallstone pancreatitis with a lipase greater than 3000.  GI was initially consulted due to a filling defect seen on CT scan.  After his pancreatitis had improved, an MRCP was ordered that showed once again a filling defect, likely a periampullary stone.  GI then took him for ERCP yesterday for stone extraction and sphincterotomy.  He tolerated this well.  We have been asked while here to evaluate him for cholecystectomy.  Of note, he was found to have a left renal mass concerning for RCC.  This will be followed up as an outpatient with Dr. Karsten Ro.  ROS: Please see HPI, otherwise all of his abdominal symptoms have resolved and all other systems are negative.  He only wears O2 at night occasionally if he feels he needs it.  He only sleeps on 1 pillow and does his normal ADLs without getting SOB. (just saw Dr. Gwenette Greet 15 days ago and was doing well)  Family History  Problem Relation Age of Onset  . Cirrhosis Brother     liver  . Dementia Mother   . Colon cancer Neg Hx   . Prostate cancer Neg Hx   . CAD Neg Hx     Past Medical History  Diagnosis Date  . DM (diabetes mellitus) 10/2009  . COPD (chronic obstructive pulmonary disease)   . Abnormal chest xray     last XR 11-2010, no further  f/u CXRs  . Syncope 2010    saw cards   . Hyperlipidemia   . Hypertension   . Prostate cancer 2008    s/p XRT  . ED (erectile dysfunction)   . 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  . Personal history of colonic polyps     none in 2010     Past Surgical History  Procedure Laterality Date  . Cataract extraction      bilaterally  . Eye surgery Left 07/17/2014  . Colonoscopy    . Ercp N/A 08/16/2014    Procedure: ENDOSCOPIC RETROGRADE CHOLANGIOPANCREATOGRAPHY (ERCP);  Surgeon: Milus Banister, MD;  Location: [facility];  Service: Endoscopy;  Laterality: N/A;    Social History:  reports that he quit smoking about 8 years ago. His smoking use included Cigarettes. He has a 80 pack-year smoking history. He has never used smokeless tobacco. He reports that he drinks alcohol. He reports that he does not use illicit drugs.  Allergies:  Allergies  Allergen Reactions  . Amlodipine Besylate     REACTION: swelling  . Cardura [Doxazosin Mesylate] Itching  . Clonidine Derivatives Itching  . Fluticasone-Salmeterol Hives and Itching    Medications Prior to Admission  Medication Sig Dispense Refill  . albuterol (PROVENTIL) (2.5 MG/3ML) 0.083% nebulizer solution Take 3 mLs (2.5 mg total) by nebulization 4 (four) times daily as needed.  75 mL  0  . aspirin 81 MG tablet Take 81 mg by mouth daily.        Marland Kitchen atorvastatin (LIPITOR) 20 MG tablet take 1 tablet by mouth once daily  30 tablet  6  .  carvedilol (COREG) 25 MG tablet take 1 tablet by mouth twice a day with meals  60 tablet  6  . furosemide (LASIX) 20 MG tablet Take 20 mg by mouth daily.      Marland Kitchen guaiFENesin (MUCINEX) 600 MG 12 hr tablet Take 600 mg by mouth daily.        . Ipratropium-Albuterol (COMBIVENT RESPIMAT) 20-100 MCG/ACT AERS respimat Inhale 1 puff into the lungs every 6 (six) hours as needed for wheezing.      Marland Kitchen losartan (COZAAR) 100 MG tablet take 1 tablet by mouth once daily  30 tablet  6  . metFORMIN (GLUCOPHAGE) 500 MG tablet take 1 tablet by mouth twice a day with meals  60 tablet  2  . Multiple Vitamins-Minerals (PRESERVISION AREDS PO) Take 2 tablets by mouth daily.        . SYMBICORT 160-4.5 MCG/ACT inhaler inhale 2 puffs by mouth twice a day  10.2 g  5    Blood  pressure 160/71, pulse 75, temperature 98.5 F (36.9 C), temperature source Oral, resp. rate 20, height '5\' 10"'  (1.778 m), weight 182 lb 12.9 oz (82.921 kg), SpO2 92.00%. Physical Exam: General: pleasant, WD, WN white male who is laying sitting up in his chair in NAD HEENT: head is normocephalic, atraumatic.  Sclera are noninjected.  PERRL.  Ears and nose without any masses or lesions.  Mouth is pink and moist Heart: regular, rate, and rhythm.  Normal s1,s2. No obvious murmurs, gallops, or rubs noted.  Palpable radial and pedal pulses bilaterally Lungs: CTAB, no wheezes, rhonchi, or rales noted.  Respiratory effort nonlabored.  He does have [place] present, but no respiratory distress Abd: soft, NT, ND, +BS, no masses, hernias, or organomegaly MS: all 4 extremities are symmetrical with no cyanosis, clubbing, or edema. Skin: warm and dry with no masses, lesions, or rashes Psych: A&Ox3 with an appropriate affect.    Results for orders placed during the hospital encounter of 08/12/14 (from the past 48 hour(s))  GLUCOSE, CAPILLARY     Status: Abnormal   Collection Time    08/15/14  6:21 PM      Result Value Ref Range   Glucose-Capillary 120 (*) 70 - 99 mg/dL   Comment 1 Documented in Chart    GLUCOSE, CAPILLARY     Status: Abnormal   Collection Time    08/15/14 10:34 PM      Result Value Ref Range   Glucose-Capillary 119 (*) 70 - 99 mg/dL   Comment 1 Documented in Chart     Comment 2 Notify RN    LIPASE, BLOOD     Status: Abnormal   Collection Time    08/16/14  3:35 AM      Result Value Ref Range   Lipase 60 (*) 11 - 59 U/L  COMPREHENSIVE METABOLIC PANEL     Status: Abnormal   Collection Time    08/16/14  3:35 AM      Result Value Ref Range   Sodium 136 (*) 137 - 147 mEq/L   Potassium 3.3 (*) 3.7 - 5.3 mEq/L   Chloride 102  96 - 112 mEq/L   CO2 20  19 - 32 mEq/L   Glucose, Bld 116 (*) 70 - 99 mg/dL   BUN 11  6 - 23 mg/dL   Creatinine, Ser 0.72  0.50 - 1.35 mg/dL   Calcium 8.0 (*)  8.4 - 10.5 mg/dL   Total Protein 5.5 (*) 6.0 - 8.3 g/dL   Albumin 2.2 (*)  3.5 - 5.2 g/dL   AST 15  0 - 37 U/L   ALT 19  0 - 53 U/L   Alkaline Phosphatase 110  39 - 117 U/L   Total Bilirubin 1.4 (*) 0.3 - 1.2 mg/dL   GFR calc non Af Amer 86 (*) >90 mL/min   GFR calc Af Amer >90  >90 mL/min   Comment: (NOTE)     The eGFR has been calculated using the CKD EPI equation.     This calculation has not been validated in all clinical situations.     eGFR's persistently <90 mL/min signify possible Chronic Kidney     Disease.   Anion gap 14  5 - 15  CBC     Status: Abnormal   Collection Time    08/16/14  3:35 AM      Result Value Ref Range   WBC 10.5  4.0 - 10.5 K/uL   RBC 3.43 (*) 4.22 - 5.81 MIL/uL   Hemoglobin 10.8 (*) 13.0 - 17.0 g/dL   HCT 31.7 (*) 39.0 - 52.0 %   MCV 92.4  78.0 - 100.0 fL   MCH 31.5  26.0 - 34.0 pg   MCHC 34.1  30.0 - 36.0 g/dL   RDW 12.9  11.5 - 15.5 %   Platelets 126 (*) 150 - 400 K/uL  GLUCOSE, CAPILLARY     Status: Abnormal   Collection Time    08/16/14  7:38 AM      Result Value Ref Range   Glucose-Capillary 121 (*) 70 - 99 mg/dL  GLUCOSE, CAPILLARY     Status: Abnormal   Collection Time    08/16/14 10:42 AM      Result Value Ref Range   Glucose-Capillary 115 (*) 70 - 99 mg/dL  GLUCOSE, CAPILLARY     Status: Abnormal   Collection Time    08/16/14 12:15 PM      Result Value Ref Range   Glucose-Capillary 136 (*) 70 - 99 mg/dL  GLUCOSE, CAPILLARY     Status: Abnormal   Collection Time    08/16/14  4:54 PM      Result Value Ref Range   Glucose-Capillary 138 (*) 70 - 99 mg/dL  GLUCOSE, CAPILLARY     Status: Abnormal   Collection Time    08/16/14  9:58 PM      Result Value Ref Range   Glucose-Capillary 140 (*) 70 - 99 mg/dL  LIPASE, BLOOD     Status: Abnormal   Collection Time    08/17/14  5:07 AM      Result Value Ref Range   Lipase 65 (*) 11 - 59 U/L  CBC     Status: Abnormal   Collection Time    08/17/14  5:07 AM      Result Value Ref Range    WBC 10.3  4.0 - 10.5 K/uL   RBC 3.59 (*) 4.22 - 5.81 MIL/uL   Hemoglobin 11.4 (*) 13.0 - 17.0 g/dL   HCT 32.6 (*) 39.0 - 52.0 %   MCV 90.8  78.0 - 100.0 fL   MCH 31.8  26.0 - 34.0 pg   MCHC 35.0  30.0 - 36.0 g/dL   RDW 12.7  11.5 - 15.5 %   Platelets 133 (*) 150 - 400 K/uL  COMPREHENSIVE METABOLIC PANEL     Status: Abnormal   Collection Time    08/17/14  5:07 AM      Result Value Ref Range   Sodium 138  137 - 147 mEq/L   Potassium 3.9  3.7 - 5.3 mEq/L   Chloride 105  96 - 112 mEq/L   CO2 21  19 - 32 mEq/L   Glucose, Bld 129 (*) 70 - 99 mg/dL   BUN 13  6 - 23 mg/dL   Creatinine, Ser 0.75  0.50 - 1.35 mg/dL   Calcium 8.1 (*) 8.4 - 10.5 mg/dL   Total Protein 5.7 (*) 6.0 - 8.3 g/dL   Albumin 2.2 (*) 3.5 - 5.2 g/dL   AST 20  0 - 37 U/L   Comment: SLIGHT HEMOLYSIS     HEMOLYSIS AT THIS LEVEL MAY AFFECT RESULT   ALT 18  0 - 53 U/L   Alkaline Phosphatase 105  39 - 117 U/L   Total Bilirubin 1.3 (*) 0.3 - 1.2 mg/dL   GFR calc non Af Amer 84 (*) >90 mL/min   GFR calc Af Amer >90  >90 mL/min   Comment: (NOTE)     The eGFR has been calculated using the CKD EPI equation.     This calculation has not been validated in all clinical situations.     eGFR's persistently <90 mL/min signify possible Chronic Kidney     Disease.   Anion gap 12  5 - 15  GLUCOSE, CAPILLARY     Status: Abnormal   Collection Time    08/17/14  7:45 AM      Result Value Ref Range   Glucose-Capillary 126 (*) 70 - 99 mg/dL  GLUCOSE, CAPILLARY     Status: Abnormal   Collection Time    08/17/14 11:46 AM      Result Value Ref Range   Glucose-Capillary 168 (*) 70 - 99 mg/dL   Dg Ercp Biliary & Pancreatic Ducts  08/16/2014   CLINICAL DATA:  Choledocholithiasis  EXAM: ERCP with balloon sweep and sphincterotomy  TECHNIQUE: Multiple spot images obtained with the fluoroscopic device and submitted for interpretation post-procedure.  COMPARISON:  08/14/2014  FINDINGS: Limited spot fluoroscopic views during ERCP demonstrate  cannulation, guidewire access and contrast injection of the common bile duct. Mild CBD dilatation. Balloon sweep of the common bile duct performed. Distal CBD filling defect initially noted. Apparently the procedure was successful at stone removal. Please refer to the operative note for details of stone removal.  FLUOROSCOPY TIME:  3.3 min  IMPRESSION: Limited imaging during ERCP with balloon sweep and sphincterotomy for stone removal.  These images were submitted for radiologic interpretation only. Please see the procedural report for the amount of contrast and the fluoroscopy time utilized.   Electronically Signed   By: Daryll Brod M.D.   On: 08/16/2014 10:41       Assessment/Plan 1. Gallstone pancreatitis with choledocholithiasis, s/p ERCP 2. Gold B COPD 3. Left renal mass Patient Active Problem List   Diagnosis Date Noted  . Choledocholithiasis 08/16/2014  . Acute-on-chronic respiratory failure 08/14/2014  . Aspiration pneumonitis 08/14/2014  . Acute pancreatitis 08/12/2014  . CAP (community acquired pneumonia) 08/12/2014  . Acute respiratory failure with hypoxia 08/12/2014  . Leukocytosis 08/12/2014  . Abnormal LFTs 08/12/2014  . Renal mass 08/12/2014  . Type II or unspecified type diabetes mellitus without mention of complication, not stated as uncontrolled 08/12/2014  . Nonspecific (abnormal) findings on radiological and other examination of biliary tract 08/12/2014  . Cholestatic hepatitis 11/02/2013  . COPD (chronic obstructive pulmonary disease) 12/08/2007  . MALIGNANT NEOPLASM OF PROSTATE 11/02/2007  . ERECTILE DYSFUNCTION 09/17/2007  . HYPERLIPIDEMIA 12/17/2006  . HYPERTENSION  12/17/2006   Plan: 1. Recommendations are to proceed with a lap chole during the same admission as pancreatitis to prevent future episodes that may causing worsening problems with the pancreas. The patient does have COPD, but just saw Dr. Gwenette Greet about 2 weeks ago and was noted to be quite stable.   He, of course, is higher risk for postoperative pulmonary problems and possible ventilation, which I have discussed with him.  I will discuss this patient with Dr. Hassell Done and plan for NPO p MN in preparation for possible OR tomorrow for lap chole.  We will follow along.   Cas Tracz E 08/17/2014, 1:56 PM Pager: [phone]

## 2014-08-18 ENCOUNTER — Inpatient Hospital Stay (HOSPITAL_COMMUNITY): Payer: Medicare Other

## 2014-08-18 ENCOUNTER — Encounter (HOSPITAL_COMMUNITY)
Admission: EM | Disposition: A | Discharge status: Home Health | Payer: Self-pay | Source: Home / Self Care | Attending: Internal Medicine

## 2014-08-18 LAB — COMPREHENSIVE METABOLIC PANEL
ALK PHOS: 130 U/L — AB (ref 39–117)
ALT: 21 U/L (ref 0–53)
AST: 23 U/L (ref 0–37)
Albumin: 2.4 g/dL — ABNORMAL LOW (ref 3.5–5.2)
Anion gap: 13 (ref 5–15)
BUN: 13 mg/dL (ref 6–23)
CALCIUM: 8.4 mg/dL (ref 8.4–10.5)
CO2: 21 meq/L (ref 19–32)
Chloride: 104 mEq/L (ref 96–112)
Creatinine, Ser: 0.74 mg/dL (ref 0.50–1.35)
GFR calc Af Amer: >90 mL/min (ref >90)
GFR, EST NON AFRICAN AMERICAN: 85 mL/min — AB (ref >90)
Glucose, Bld: 161 mg/dL — ABNORMAL HIGH (ref 70–99)
POTASSIUM: 3.7 meq/L (ref 3.7–5.3)
SODIUM: 138 meq/L (ref 137–147)
Total Bilirubin: 1 mg/dL (ref 0.3–1.2)
Total Protein: 6.2 g/dL (ref 6.0–8.3)

## 2014-08-18 LAB — SURGICAL PCR SCREEN
MRSA, PCR: INVALID — AB
STAPHYLOCOCCUS AUREUS: INVALID — AB

## 2014-08-18 LAB — GLUCOSE, CAPILLARY
GLUCOSE-CAPILLARY: 121 mg/dL — AB (ref 70–99)
Glucose-Capillary: 133 mg/dL — ABNORMAL HIGH (ref 70–99)
Glucose-Capillary: 159 mg/dL — ABNORMAL HIGH (ref 70–99)
Glucose-Capillary: 130 mg/dL — ABNORMAL HIGH (ref 70–99)
Glucose-Capillary: 125 mg/dL — ABNORMAL HIGH (ref 70–99)

## 2014-08-18 LAB — CULTURE, BLOOD (ROUTINE X 2): Culture: NO GROWTH

## 2014-08-18 LAB — CBC
HCT: 35.8 % — ABNORMAL LOW (ref 39.0–52.0)
Hemoglobin: 12.3 g/dL — ABNORMAL LOW (ref 13.0–17.0)
MCH: 31.9 pg (ref 26.0–34.0)
MCHC: 34.4 g/dL (ref 30.0–36.0)
MCV: 93 fL (ref 78.0–100.0)
PLATELETS: 187 10*3/uL (ref 150–400)
RBC: 3.85 MIL/uL — AB (ref 4.22–5.81)
RDW: 13.1 % (ref 11.5–15.5)
WBC: 14.3 10*3/uL — ABNORMAL HIGH (ref 4.0–10.5)

## 2014-08-18 LAB — LIPASE, BLOOD: LIPASE: 91 U/L — AB (ref 11–59)

## 2014-08-18 SURGERY — LAPAROSCOPIC CHOLECYSTECTOMY WITH INTRAOPERATIVE CHOLANGIOGRAM
Anesthesia: General

## 2014-08-18 MED ORDER — HYDRALAZINE HCL 20 MG/ML IJ SOLN
10.0000 mg | INTRAMUSCULAR | Status: DC | PRN
  Administered 2014-08-19: 10 mg via INTRAVENOUS
  Filled 2014-08-18: qty 1

## 2014-08-18 MED ORDER — ALBUTEROL SULFATE (2.5 MG/3ML) 0.083% IN NEBU: 2.5000 mg | INHALATION_SOLUTION | RESPIRATORY_TRACT | Status: DC | PRN

## 2014-08-18 MED ORDER — BUDESONIDE 0.5 MG/2ML IN SUSP
0.5000 mg | Freq: Two times a day (BID) | RESPIRATORY_TRACT | Status: DC
  Administered 2014-08-18 – 2014-08-22 (×9): 0.5 mg via RESPIRATORY_TRACT
  Filled 2014-08-18 (×13): qty 2

## 2014-08-18 MED ORDER — ALUM & MAG HYDROXIDE-SIMETH 200-200-20 MG/5ML PO SUSP: 30.0000 mL | Freq: Four times a day (QID) | ORAL | Status: DC | PRN

## 2014-08-18 MED ORDER — CETYLPYRIDINIUM CHLORIDE 0.05 % MT LIQD
7.0000 mL | Freq: Two times a day (BID) | OROMUCOSAL | Status: DC
  Administered 2014-08-18 – 2014-08-23 (×6): 7 mL via OROMUCOSAL

## 2014-08-18 MED ORDER — MAGIC MOUTHWASH
15.0000 mL | Freq: Four times a day (QID) | ORAL | Status: DC | PRN
  Filled 2014-08-18: qty 15

## 2014-08-18 MED ORDER — FUROSEMIDE 10 MG/ML IJ SOLN
40.0000 mg | Freq: Once | INTRAMUSCULAR | Status: AC
  Administered 2014-08-18: 40 mg via INTRAVENOUS

## 2014-08-18 MED ORDER — PIPERACILLIN-TAZOBACTAM 3.375 G IVPB
3.3750 g | Freq: Three times a day (TID) | INTRAVENOUS | Status: DC
  Administered 2014-08-18 – 2014-08-22 (×13): 3.375 g via INTRAVENOUS
  Filled 2014-08-18 (×13): qty 50

## 2014-08-18 MED ORDER — LIP MEDEX EX OINT
1.0000 "application " | TOPICAL_OINTMENT | Freq: Two times a day (BID) | CUTANEOUS | Status: DC
  Administered 2014-08-18 – 2014-08-23 (×11): 1 via TOPICAL
  Filled 2014-08-18 (×2): qty 7

## 2014-08-18 MED ORDER — FUROSEMIDE 10 MG/ML IJ SOLN
40.0000 mg | Freq: Once | INTRAMUSCULAR | Status: AC
  Administered 2014-08-18: 40 mg via INTRAVENOUS
  Filled 2014-08-18: qty 4

## 2014-08-18 MED ORDER — POLYETHYLENE GLYCOL 3350 17 G PO PACK
17.0000 g | PACK | Freq: Two times a day (BID) | ORAL | Status: DC | PRN
  Filled 2014-08-18: qty 1

## 2014-08-18 MED ORDER — LACTATED RINGERS IV BOLUS (SEPSIS): 1000.0000 mL | Freq: Three times a day (TID) | INTRAVENOUS | Status: AC | PRN

## 2014-08-18 MED ORDER — BISACODYL 10 MG RE SUPP: 10.0000 mg | Freq: Two times a day (BID) | RECTAL | Status: DC | PRN

## 2014-08-18 MED ORDER — ARFORMOTEROL TARTRATE 15 MCG/2ML IN NEBU
15.0000 ug | INHALATION_SOLUTION | Freq: Two times a day (BID) | RESPIRATORY_TRACT | Status: DC
  Administered 2014-08-18 – 2014-08-23 (×10): 15 ug via RESPIRATORY_TRACT
  Filled 2014-08-18 (×14): qty 2

## 2014-08-18 MED ORDER — ISOSORB DINITRATE-HYDRALAZINE 20-37.5 MG PO TABS
1.0000 | ORAL_TABLET | Freq: Three times a day (TID) | ORAL | Status: DC
  Administered 2014-08-18 – 2014-08-22 (×13): 1 via ORAL
  Filled 2014-08-18 (×17): qty 1

## 2014-08-18 MED ORDER — PHENOL 1.4 % MT LIQD
2.0000 | OROMUCOSAL | Status: DC | PRN
  Filled 2014-08-18: qty 177

## 2014-08-18 MED ORDER — FUROSEMIDE 10 MG/ML IJ SOLN
INTRAMUSCULAR | Status: AC
  Administered 2014-08-18: 40 mg via INTRAVENOUS
  Filled 2014-08-18: qty 4

## 2014-08-18 MED ORDER — FENTANYL CITRATE 0.05 MG/ML IJ SOLN
INTRAMUSCULAR | Status: AC
  Filled 2014-08-18: qty 2

## 2014-08-18 MED ORDER — CHLORHEXIDINE GLUCONATE 0.12 % MT SOLN
15.0000 mL | Freq: Two times a day (BID) | OROMUCOSAL | Status: DC
  Administered 2014-08-18 – 2014-08-23 (×9): 15 mL via OROMUCOSAL
  Filled 2014-08-18 (×13): qty 15

## 2014-08-18 MED ORDER — ACETAMINOPHEN 650 MG RE SUPP: 650.0000 mg | Freq: Four times a day (QID) | RECTAL | Status: DC | PRN

## 2014-08-18 MED ORDER — PROMETHAZINE HCL 25 MG/ML IJ SOLN: 6.2500 mg | INTRAMUSCULAR | Status: DC | PRN

## 2014-08-18 MED ORDER — SACCHAROMYCES BOULARDII 250 MG PO CAPS
250.0000 mg | ORAL_CAPSULE | Freq: Two times a day (BID) | ORAL | Status: DC
  Administered 2014-08-18 – 2014-08-23 (×10): 250 mg via ORAL
  Filled 2014-08-18 (×13): qty 1

## 2014-08-18 MED ORDER — PROPOFOL 10 MG/ML IV BOLUS
INTRAVENOUS | Status: AC
  Filled 2014-08-18: qty 20

## 2014-08-18 MED ORDER — MENTHOL 3 MG MT LOZG: 1.0000 | LOZENGE | OROMUCOSAL | Status: DC | PRN

## 2014-08-18 NOTE — Significant Event (Signed)
Rapid Response Event Note  Overview: Time Called: 0425 Arrival Time: 0430 Event Type: Respiratory  Initial Focused Assessment: Pt had sudden onset of SOB and sats were 81% on 2L [employer]. Pt sitting on side of bed upon arrival. Alert and oriented very labored respirations. Lungs sounds diminished with rhonchi and crackles.   Interventions: Put on NRB, EKG, CXR and duo neb given. NP arrived to the room lasix 40mg  IV given. Pt transferred to 1225   Event Summary: Name of Physician Notified: Kathrine Schorr at [facility]    at    Outcome: Transferred (Comment)  Event End Time: 0510  Pricilla Riffle

## 2014-08-18 NOTE — Progress Notes (Signed)
[facility] PROGRESS NOTE  Jim [name] TIW:[id number] DOB: 03/30/1932 DOA: 08/12/2014 PCP: [name], MD  Brief Summary  Interim summary  80 year old male with past medical history of hypertension, dyslipidemia, diabetes, chronic diastolic CHF, history of prostate cancer who presented to [facility] ED 08/12/2014 with worsening abdominal pain over past 24 hours prior to this admission. Patient explains pain is in the upper and mid abdomen associated with multiple episodes of nonbloody vomiting. In emergency department, the patient was noted to have a lipase >3000, AST 65, ALT 90, ALT 187, total bilirubin 6.5. BNP was 772, WBC 15.6. CT abdomen showed focal midline retroperitoneal inflammation unclear if this is sequela of duodenitis or possible pancreatitis, possible mid to proximal small bowel wall thickening, non-obstructing sub-centimeters distal common bile duct lesion which could reflect polyp or less likely neoplasm, 16 mm solid left lower pole renal mass highly concerning for renal cell carcinoma.  The patient was placed on bowel rest and started on intravenous fluids for his acute pancreatitis. Gastroenterology was consulted. MRCP was performed as well as MRI of the abdomen to clarify his left renal mass. The MRCP revealed a distal common bile duct filling defect concerning for a stone. MRI of the abdomen revealed a suspicious left renal lesion concerning for renal cell carcinoma. Urology has been consulted, and felt due to slow growing nature of these masses, pt can follow up as an outpatient. Gastroenterology plans for ERCP on 08/16/2014. The patient continues on Unasyn for empiric treatment of aspiration pneumonitis and possible cholangitis.  Transferred to stepdown on 9/25 for increasing respiratory distress.    Assessment/Plan  Acute on chronic respiratory failure, worsening respiratory distress overnight.  Likely multifactorial from COPD, aspiration pneumonia, and now acute diastolic heart  failure -  ECHO -  Continue lasix 40mg  IV once daily -  Wean oxygen to 2.5L as tolerated (currently on venti mask) -  Continue unasyn and bronchodilators -  Will try to avoid systemic steroids due to underlying infection -  Defer surgery secondary to increased respiratory distress  Acute pancreatitis with CBD stone  --LFTs, lipase improving  --MRCP--distant, bowel duct filling defect suspicious for periampullary stone  - ERCP done on 08/16/14 shows Choledocholiathiasis, treated with biliary sphincterotomy and balloon sweeping. - CCS consulted:  Planning cholecystectomy when medically stable  Klebsiella bacteremia, likely seeded from cholangitis/obstructing stone, sensitive to unasyn but not ampicillin -  Change to zosyn -  Spiked another fever overnight and WBC rising  Renal mass suspicious for RCC based on MRI appearance -Appreciate urology consult-->followup with Dr. Consuella Lose as outpatient   COPD with increased WOB which is more likely secondary to acute diastolic heart failure than COPD. - change to nebulized LABA and ICS while having increased WOB - Continue albuterol and Atrovent  -  Already on abx -  Will try to void systemic steroids but if not improving with diuresis, may need to start steroids  Hypertension, now hypertensive -  Continue carvedilol and losartan  -  Start prn hydralazine -  Start bidil  Diabetes mellitus type 2, CBG well controlled - 08/12/2014 hemoglobin A1c 7.0 -  Cont to hold metformin -  Continue ISS  Diet:  NPO until breathing somewhat more improved Access:  PIV IVF:  off Proph:  SCDs   Code Status: full Family Communication: patient, his brother and sister Disposition Plan:  Continue stepdown while on venti mask   Consultants:  General surgery, Dr. Johney Maine  GI, Dr. Carlean Purl  Urology, Dr. Junious Silk  Procedures:  9/20 CXR:  NAD  9/19 CXR:  Scarring, possibly mild pneumonia or atelectasis, mild vascular congestion  9/19 CT abd/pelvis:   Focal midline retroperitoneal inflammation, unclear if this is reflects sequelae duodenitis or possible pancreatitis. Suspected mild proximal small bowel wall thickening. Nonobstructing subcentimeter distal Common bile duct lesion, which could reflect polyp or less likely neoplasm, possible sludge ball. Recommend MRCP on a nonemergent basis. Small hiatal hernia with moderate amount of associated mildly inflamed fat, which is possibly tracking from the retroperitoneum. 16 mm solid LEFT lower pole renal mass highly concerning for renal cell carcinoma  9/21 MRCP:   Filling defect within the distal common duct, highly suspicious for periampullary stone. This is positioned more distally than on 08/12/14 CT. Mild secondary biliary ductal dilatation. 3. 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 4. Progressive moderate pancreatitis  ERCP 08-16-14  The CBD was dilated diffusely up to 64mm and there was a round filling defect in distal bile duct. An adequate biliary sphincterotomy was performed over the biliary wire and then a biliary retrieval balloon was used to sweep the bile ducts. A single 10mm greenish stone was delivered into the duodenum. There was no purulence. A completion, occlusion cholangiogram showed no further filling defects. The main pancreatic duct was never cannulated or injected with dye  Antibiotics: Levofloxacin 9/19>>9/20  unasyn 08/13/14>>> 9/25 Zosyn 9/25 >>    HPI/Subjective:  Still feeling somewhat SOB, but better than a few hours ago.  Has been increasingly swollen for the last few days.  Continues to have a cough productive of white phlegm with streaks of blood.  + voiding and having BMs.    Objective: Filed Vitals:   08/18/14 0524 08/18/14 0600 08/18/14 0637 08/18/14 0700  BP: 195/80 182/69  163/81  Pulse: 82 72 80 65  Temp: 98.4 F (36.9 C)     TempSrc: Axillary     Resp: 19 15 16 18   Height: 5\' 10"  (1.778 m)      Weight: 84.3 kg (185 lb 13.6 oz)     SpO2: 100% 99% 98% 97%    Intake/Output Summary (Last 24 hours) at 08/18/14 0744 Last data filed at 08/18/14 8413  Gross per 24 hour  Intake 673.67 ml  Output   1700 ml  Net -1026.33 ml   Filed Weights   08/16/14 0630 08/17/14 0530 08/18/14 0524  Weight: 82.918 kg (182 lb 12.8 oz) 82.921 kg (182 lb 12.9 oz) 84.3 kg (185 lb 13.6 oz)    Exam:   General:  WM, No acute distress, venti mask in place  HEENT:  NCAT, MMM  Cardiovascular:  RRR, nl S1, S2 no mrg, 2+ pulses, warm extremities  Respiratory:  Diminished at bilateral bases, no obvious wheezes, rales, or rhonchi anteriorly, no increased WOB  Abdomen:   NABS, soft, mildly distended, NT  MSK:   Normal tone and bulk, 1+ bilateral LEE  Neuro:  Grossly intact  Data Reviewed: Basic Metabolic Panel:  Recent Labs Lab 08/13/14 0554 08/14/14 0437 08/16/14 0335 08/17/14 0507 08/18/14 0435  NA 134* 136* 136* 138 138  K 3.8 3.8 3.3* 3.9 3.7  CL 99 101 102 105 104  CO2 23 21 20 21 21   GLUCOSE 149* 158* 116* 129* 161*  BUN 21 14 11 13 13   CREATININE 1.14 0.80 0.72 0.75 0.74  CALCIUM 8.2* 8.2* 8.0* 8.1* 8.4   Liver Function Tests:  Recent Labs Lab 08/13/14 0554 08/14/14 0437 08/16/14 0335 08/17/14  0507 08/18/14 0435  AST 29 22 15 20 23   ALT 44 33 19 18 21   ALKPHOS 123* 123* 110 105 130*  BILITOT 2.4* 2.1* 1.4* 1.3* 1.0  PROT 5.9* 6.0 5.5* 5.7* 6.2  ALBUMIN 2.7* 2.6* 2.2* 2.2* 2.4*    Recent Labs Lab 08/14/14 0437 08/15/14 0435 08/16/14 0335 08/17/14 0507 08/18/14 0435  LIPASE 113* 68* 60* 65* 91*   No results found for this basename: AMMONIA,  in the last 168 hours CBC:  Recent Labs Lab 08/12/14 0431 08/13/14 0554 08/14/14 0437 08/16/14 0335 08/17/14 0507 08/18/14 0435  WBC 15.6* 11.0* 10.1 10.5 10.3 14.3*  NEUTROABS 13.9*  --   --   --   --   --   HGB 14.0 11.5* 11.6* 10.8* 11.4* 12.3*  HCT 41.1 34.3* 34.7* 31.7* 32.6* 35.8*  MCV 93.4 95.3 94.6 92.4  90.8 93.0  PLT 206 124* 120* 126* 133* 187   Cardiac Enzymes: No results found for this basename: CKTOTAL, CKMB, CKMBINDEX, TROPONINI,  in the last 168 hours BNP (last 3 results)  Recent Labs  08/12/14 0408  PROBNP 772.2*   CBG:  Recent Labs Lab 08/17/14 0745 08/17/14 1146 08/17/14 1641 08/17/14 2157 08/18/14 0437  GLUCAP 126* 168* 200* 172* 133*    Recent Results (from the past 240 hour(s))  CULTURE, BLOOD (ROUTINE X 2)     Status: None   Collection Time    08/12/14  9:19 AM      Result Value Ref Range Status   Specimen Description BLOOD RIGHT ARM   Final   Special Requests     Final   Value: BOTTLES DRAWN AEROBIC AND ANAEROBIC 10CC BLUE BOTTLE, 7 CC RED BOTTLE   Culture  Setup Time     Final   Value: 08/12/2014 13:42     Performed at [facility]   Culture     Final   Value:        BLOOD CULTURE RECEIVED NO GROWTH TO DATE CULTURE WILL BE HELD FOR 5 DAYS BEFORE ISSUING A FINAL NEGATIVE REPORT     Performed at [facility]   Report Status PENDING   Incomplete  CULTURE, BLOOD (ROUTINE X 2)     Status: None   Collection Time    08/12/14  9:24 AM      Result Value Ref Range Status   Specimen Description BLOOD RIGHT ARM   Final   Special Requests     Final   Value: BOTTLES DRAWN AEROBIC AND ANAEROBIC 10CC BOTH BOTTLES   Culture  Setup Time     Final   Value: 08/12/2014 13:42     Performed at [facility]   Culture     Final   Value: KLEBSIELLA PNEUMONIAE     Note: Gram Stain Report Called to,Read Back By and Verified With: Trinna Balloon ON 08/16/2014 AT 11:55P BY WILEJ     Performed at [facility]   Report Status 08/18/2014 FINAL   Final   Organism ID, Bacteria KLEBSIELLA PNEUMONIAE   Final  CULTURE, EXPECTORATED SPUTUM-ASSESSMENT     Status: None   Collection Time    08/13/14  1:56 AM      Result Value Ref Range Status   Specimen Description SPUTUM   Final   Special Requests NONE   Final   Sputum evaluation     Final    Value: MICROSCOPIC FINDINGS SUGGEST THAT THIS SPECIMEN IS NOT REPRESENTATIVE OF LOWER RESPIRATORY SECRETIONS. PLEASE RECOLLECT.  V STEWART AT 0216 ON 09.20.2015 BY NBROOKS   Report Status 08/13/2014 FINAL   Final  CULTURE, EXPECTORATED SPUTUM-ASSESSMENT     Status: None   Collection Time    08/13/14  6:47 AM      Result Value Ref Range Status   Specimen Description SPUTUM   Final   Special Requests NONE   Final   Sputum evaluation     Final   Value: THIS SPECIMEN IS ACCEPTABLE. RESPIRATORY CULTURE REPORT TO FOLLOW.   Report Status 08/13/2014 FINAL   Final  CULTURE, RESPIRATORY (NON-EXPECTORATED)     Status: None   Collection Time    08/13/14  6:47 AM      Result Value Ref Range Status   Specimen Description SPUTUM   Final   Special Requests NONE   Final   Gram Stain     Final   Value: FEW WBC PRESENT, PREDOMINANTLY MONONUCLEAR     RARE SQUAMOUS EPITHELIAL CELLS PRESENT     RARE GRAM POSITIVE COCCI     IN PAIRS IN CLUSTERS RARE GRAM NEGATIVE RODS     Performed at [facility]   Culture     Final   Value: NORMAL OROPHARYNGEAL FLORA     Performed at [facility]   Report Status 08/15/2014 FINAL   Final  URINE CULTURE     Status: None   Collection Time    08/13/14  1:58 PM      Result Value Ref Range Status   Specimen Description URINE, CATHETERIZED   Final   Special Requests NONE   Final   Culture  Setup Time     Final   Value: 08/13/2014 19:11     Performed at [facility]     Final   Value: NO GROWTH     Performed at [facility]   Culture     Final   Value: NO GROWTH     Performed at [facility]   Report Status 08/14/2014 FINAL   Final  SURGICAL PCR SCREEN     Status: Abnormal   Collection Time    08/18/14  1:05 AM      Result Value Ref Range Status   MRSA, PCR INVALID RESULTS, SPECIMEN SENT FOR CULTURE (*) NEGATIVE Final   Comment: RESULT CALLED TO, READ BACK BY AND VERIFIED WITH:     V.STEWART,RN AT 0303 ON  08/18/14 BY SHEAW   Staphylococcus aureus INVALID RESULTS, SPECIMEN SENT FOR CULTURE (*) NEGATIVE Final   Comment: RESULT CALLED TO, READ BACK BY AND VERIFIED WITH:     V.STEWART,RN AT 0303 ON 08/18/14 BY SHEAW                The Xpert SA Assay (FDA     approved for NASAL specimens     in patients over 75 years of age),     is one component of     a comprehensive surveillance     program.  Test performance has     been validated by [employer] for patients greater     than or equal to 82 year old.     It is not intended     to diagnose infection nor to     guide or monitor treatment.     Studies: Dg Chest Port 1 View  08/18/2014   CLINICAL DATA:  Labored breathing  EXAM: PORTABLE CHEST - 1 VIEW  COMPARISON:  08/13/2014  FINDINGS: New diffuse interstitial prominence with small pleural effusions. An apparent cavity in the right upper lung is likely scarring based on previous imaging. Unchanged normal heart size. Unchanged aortic tortuosity. No pneumothorax.  IMPRESSION: New pulmonary edema and small pleural effusions.   Electronically Signed   By: Jorje Guild M.D.   On: 08/18/2014 05:02   Dg Ercp Biliary & Pancreatic Ducts  08/16/2014   CLINICAL DATA:  Choledocholithiasis  EXAM: ERCP with balloon sweep and sphincterotomy  TECHNIQUE: Multiple spot images obtained with the fluoroscopic device and submitted for interpretation post-procedure.  COMPARISON:  08/14/2014  FINDINGS: Limited spot fluoroscopic views during ERCP demonstrate cannulation, guidewire access and contrast injection of the common bile duct. Mild CBD dilatation. Balloon sweep of the common bile duct performed. Distal CBD filling defect initially noted. Apparently the procedure was successful at stone removal. Please refer to the operative note for details of stone removal.  FLUOROSCOPY TIME:  3.3 min  IMPRESSION: Limited imaging during ERCP with balloon sweep and sphincterotomy for stone removal.  These images were submitted  for radiologic interpretation only. Please see the procedural report for the amount of contrast and the fluoroscopy time utilized.   Electronically Signed   By: Daryll Brod M.D.   On: 08/16/2014 10:41    Scheduled Meds: . ampicillin-sulbactam (UNASYN) IV  1.5 g Intravenous 4 times per day  . ampicillin-sulbactam (UNASYN) IV  1.5 g Intravenous On Call to OR  . aspirin EC  81 mg Oral Daily  . atorvastatin  20 mg Oral q1800  . budesonide-formoterol  2 puff Inhalation BID  . carvedilol  25 mg Oral BID WC  . chlorhexidine  1 application Topical Once  . guaiFENesin  600 mg Oral Daily  . insulin aspart  0-9 Units Subcutaneous TID WC  . ipratropium-albuterol  3 mL Nebulization QID  . losartan  50 mg Oral Daily  . sodium chloride  3 mL Intravenous Q12H   Continuous Infusions:   Principal Problem:   Acute respiratory failure with hypoxia Active Problems:   HYPERLIPIDEMIA   HYPERTENSION   COPD (chronic obstructive pulmonary disease)   Acute pancreatitis   CAP (community acquired pneumonia)   Leukocytosis   Abnormal LFTs   Renal mass   Type II or unspecified type diabetes mellitus without mention of complication, not stated as uncontrolled   Nonspecific (abnormal) findings on radiological and other examination of biliary tract   Acute-on-chronic respiratory failure   Aspiration pneumonitis   Choledocholithiasis    Time spent: 30 min    Ry Moody, [facility] Pager [phone]. If 7PM-7AM, please contact night-coverage at [email or web address], password [facility] 08/18/2014, 7:44 AM  LOS: 6 days

## 2014-08-18 NOTE — Progress Notes (Signed)
NUTRITION FOLLOW UP  Intervention:   - Diet advancement per MD - RD to continue to monitor   Nutrition Dx:   Inadequate oral intake related to inability to eat as evidenced by NPO and clear liquid diet. - ongoing, pt NPO   Goal:   Pt to meet >/= 90% of their estimated nutrition needs - not met   Monitor:   Weights, labs, diet advancement  Assessment:   80 year old male with past medical history of hypertension, dyslipidemia, diabetes, chronic diastolic CHF, history of prostate cancer who presented to [facility] ED 08/12/2014 with worsening abdominal pain over past 24 hours prior to this admission.   9/20: - Pt with acute pancreatitis and ARF.  - Pt reports that he was eating well prior to admission. He has lost 3 lbs due to being in the hospitals, but says that he will be able to eat well once his diet is upgraded. He said that he has felt hungry and wanted to know when he would be able to get solid food.  - Pt with no signs of fat or muscle wasting.  9/25: - Found to have choledocholithiasis which was treated with biliary sphincterotomy and balloon sweeping 9/23 - Had acute hypoxic respiratory failure 9/25 and was transferred to SDU from [place], rapid response called - Having cough productive of white phlegm with streaks of blood - Plan is for cholecystectomy once pt medically stable  - Is NPO - Met with pt who denies any stomach issues and said his coughing is getting better   Alk phos elevated  Lipase elevated and trending up   Height: Ht Readings from Last 1 Encounters:  08/18/14 _0  (1.778 m)    Weight Status:   Wt Readings from Last 1 Encounters:  08/18/14 185 lb 13.6 oz (84.3 kg)  Admit wt         175 lb (79.3 kg) Net I/Os: + 10.5L  Re-estimated needs:  Kcal: 1950-2250  Protein: 100-115 g  Fluid: 2.0-2.3 L/day   Skin: +1 RLE, LLE edema  Diet Order: NPO   Intake/Output Summary (Last 24 hours) at 08/18/14 0849 Last data filed at 08/18/14 2330  Gross per 24  hour  Intake 673.67 ml  Output   1700 ml  Net -1026.33 ml    Last BM: 9/23   Labs:   Recent Labs Lab 08/16/14 0335 08/17/14 0507 08/18/14 0435  NA 136* 138 138  K 3.3* 3.9 3.7  CL 102 105 104  CO2 _1 BUN _2 CREATININE 0.72 0.75 0.74  CALCIUM 8.0* 8.1* 8.4  GLUCOSE 116* 129* 161*    CBG (last 3)   Recent Labs  08/17/14 1641 08/17/14 2157 08/18/14 0437  GLUCAP 200* 172* 133*    Scheduled Meds: . antiseptic oral rinse  7 mL Mouth Rinse q12n4p  . arformoterol  15 mcg Nebulization BID  . aspirin EC  81 mg Oral Daily  . atorvastatin  20 mg Oral q1800  . budesonide (PULMICORT) nebulizer solution  0.5 mg Nebulization BID  . carvedilol  25 mg Oral BID WC  . chlorhexidine  1 application Topical Once  . chlorhexidine  15 mL Mouth Rinse BID  . furosemide  40 mg Intravenous Once  . guaiFENesin  600 mg Oral Daily  . insulin aspart  0-9 Units Subcutaneous TID WC  . ipratropium-albuterol  3 mL Nebulization QID  . isosorbide-hydrALAZINE  1 tablet Oral TID  . lip balm  1 application Topical  BID  . losartan  50 mg Oral Daily  . saccharomyces boulardii  250 mg Oral BID  . sodium chloride  3 mL Intravenous Q12H    Carlis Stable MS, RD, LDN [phone] Pager [phone] Weekend/After Hours Pager

## 2014-08-18 NOTE — Progress Notes (Signed)
Transfer note:   Notified by RN that pt found with increased WOB, diaphoretic and w/ 02 sats at 81% on 2-3L [place].  RR RN was paged and responded to bedside.Stat PCXR ordered. At bedside pt noted awake, alert and oriented.  He has mod increased WOB and use of accessory muscles. Received a duoneb at onset of SOB.  02 sats are 99% on NRB at 15L. Pt is diaphoretic. 12-Lead EKG reveals NSR w/ rate of 90. Pt denies CP. States he awoke w/ hard chills and became suddenly SOB. Temp on initial assessment 100.4. BBS diminished w/o wheezes or crackles. Pt has 1-2+ pitting edema to feet up to bil ankles. Given h/o CHF Lasix 40 mg given IV. PCXR reveals new pulmonary edema. At the time of my departure pt appeared to be improving but remained tacypneac . VS: T-98.4, BP-195/80, P-85, R-28 w/ 02 sats of 99% on NRB. He is able to talk in short sentences and admits to feeling better.  Assessment/Plan: 1. Acute hypoxic respiratory failure in clinical setting of pt w/ CAP, COPD and new onset pulmonary edema. Improving. Will transfer to SDU. Will attempt titrate 02 down as pt tolerates. Will continue to monitor closely in SDU.   Jeryl Columbia, NP-C [facility] Pager [phone]

## 2014-08-18 NOTE — Progress Notes (Signed)
ANTIBIOTIC CONSULT NOTE - INITIAL  Pharmacy Consult for Zosyn Indication: aspiration pneumonia, bacteremia from suspected GI source  Assessment: 80yoM with pancreatitis with CBD stone.  PMH COPD, HFpEF, DM, prostate cancer, HTN.  Txferred to ICU on 9/25 with worsening respiratory distress felt due to COPD, HF, and possible aspiration PNA from recent vomiting.  Was receiving unasyn for above, now spiking low-grade fever and WBC trending up.  Also noted to have 1/2 K pneumoniae bacteremia which may have occurred with ERCP.  Repeat BCx drawn and pharmacy consulted to transition patient to [place].  Tmax 100.4; had been afebrile since 9/19 WBC elevated today, although trend not readily apparent at this point Renal function intact; CrCl ~ 80 ml/min  9/19 blood: 1/2 positive for k. Pneumoniae, pan-sensitive except for AMP 9/20 urine: NGF 9/25 blood: pending   Goal of Therapy:  Eradication of infection Appropriate renal dosing of antibiotics  Plan:  Zosyn 3.375 g IV q8h EI Follow clinical course and cultures as available  Allergies  Allergen Reactions  . Amlodipine Besylate     REACTION: swelling  . Cardura [Doxazosin Mesylate] Itching  . Clonidine Derivatives Itching  . Fluticasone-Salmeterol Hives and Itching    Patient Measurements: Height: 5\' 10"  (177.8 cm) Weight: 185 lb 13.6 oz (84.3 kg) IBW/kg (Calculated) : 73 Adjusted Body Weight: n/a  Vital Signs: Temp: 98.4 F (36.9 C) (09/25 0524) Temp src: Axillary (09/25 0524) BP: 163/109 mmHg (09/25 0800) Pulse Rate: 69 (09/25 0800) Intake/Output from previous day: 09/24 0701 - 09/25 0700 In: 673.7 [P.O.:360; I.V.:113.7; IV Piggyback:200] Out: 1700 [Urine:1700] Intake/Output from this shift:    Labs:  Recent Labs  08/16/14 0335 08/17/14 0507 08/18/14 0435  WBC 10.5 10.3 14.3*  HGB 10.8* 11.4* 12.3*  PLT 126* 133* 187  CREATININE 0.72 0.75 0.74   Estimated Creatinine Clearance: 76 ml/min (by C-G formula based on  Cr of 0.74). No results found for this basename: VANCOTROUGH, VANCOPEAK, VANCORANDOM, [place], [place], GENTRANDOM, TOBRATROUGH, TOBRAPEAK, TOBRARND, AMIKACINPEAK, AMIKACINTROU, AMIKACIN,  in the last 72 hours   Microbiology: Recent Results (from the past 720 hour(s))  CULTURE, BLOOD (ROUTINE X 2)     Status: None   Collection Time    08/12/14  9:19 AM      Result Value Ref Range Status   Specimen Description BLOOD RIGHT ARM   Final   Special Requests     Final   Value: BOTTLES DRAWN AEROBIC AND ANAEROBIC 10CC BLUE BOTTLE, 7 CC RED BOTTLE   Culture  Setup Time     Final   Value: 08/12/2014 13:42     Performed at [facility]   Culture     Final   Value:        BLOOD CULTURE RECEIVED NO GROWTH TO DATE CULTURE WILL BE HELD FOR 5 DAYS BEFORE ISSUING A FINAL NEGATIVE REPORT     Performed at [facility]   Report Status PENDING   Incomplete  CULTURE, BLOOD (ROUTINE X 2)     Status: None   Collection Time    08/12/14  9:24 AM      Result Value Ref Range Status   Specimen Description BLOOD RIGHT ARM   Final   Special Requests     Final   Value: BOTTLES DRAWN AEROBIC AND ANAEROBIC 10CC BOTH BOTTLES   Culture  Setup Time     Final   Value: 08/12/2014 13:42     Performed at [facility]  Final   Value: KLEBSIELLA PNEUMONIAE     Note: Gram Stain Report Called to,Read Back By and Verified With: LEEANNE REINERT ON 08/16/2014 AT 11:55P BY WILEJ     Performed at [facility]   Report Status 08/18/2014 FINAL   Final   Organism ID, Bacteria KLEBSIELLA PNEUMONIAE   Final  CULTURE, EXPECTORATED SPUTUM-ASSESSMENT     Status: None   Collection Time    08/13/14  1:56 AM      Result Value Ref Range Status   Specimen Description SPUTUM   Final   Special Requests NONE   Final   Sputum evaluation     Final   Value: MICROSCOPIC FINDINGS SUGGEST THAT THIS SPECIMEN IS NOT REPRESENTATIVE OF LOWER RESPIRATORY SECRETIONS. PLEASE RECOLLECT.     Enid Derry AT  0216 ON 09.20.2015 BY NBROOKS   Report Status 08/13/2014 FINAL   Final  CULTURE, EXPECTORATED SPUTUM-ASSESSMENT     Status: None   Collection Time    08/13/14  6:47 AM      Result Value Ref Range Status   Specimen Description SPUTUM   Final   Special Requests NONE   Final   Sputum evaluation     Final   Value: THIS SPECIMEN IS ACCEPTABLE. RESPIRATORY CULTURE REPORT TO FOLLOW.   Report Status 08/13/2014 FINAL   Final  CULTURE, RESPIRATORY (NON-EXPECTORATED)     Status: None   Collection Time    08/13/14  6:47 AM      Result Value Ref Range Status   Specimen Description SPUTUM   Final   Special Requests NONE   Final   Gram Stain     Final   Value: FEW WBC PRESENT, PREDOMINANTLY MONONUCLEAR     RARE SQUAMOUS EPITHELIAL CELLS PRESENT     RARE GRAM POSITIVE COCCI     IN PAIRS IN CLUSTERS RARE GRAM NEGATIVE RODS     Performed at [facility]   Culture     Final   Value: NORMAL OROPHARYNGEAL FLORA     Performed at [facility]   Report Status 08/15/2014 FINAL   Final  URINE CULTURE     Status: None   Collection Time    08/13/14  1:58 PM      Result Value Ref Range Status   Specimen Description URINE, CATHETERIZED   Final   Special Requests NONE   Final   Culture  Setup Time     Final   Value: 08/13/2014 19:11     Performed at [place]     Final   Value: NO GROWTH     Performed at [facility]   Culture     Final   Value: NO GROWTH     Performed at [facility]   Report Status 08/14/2014 FINAL   Final  SURGICAL PCR SCREEN     Status: Abnormal   Collection Time    08/18/14  1:05 AM      Result Value Ref Range Status   MRSA, PCR INVALID RESULTS, SPECIMEN SENT FOR CULTURE (*) NEGATIVE Final   Comment: RESULT CALLED TO, READ BACK BY AND VERIFIED WITH:     V.STEWART,RN AT 0303 ON 08/18/14 BY SHEAW   Staphylococcus aureus INVALID RESULTS, SPECIMEN SENT FOR CULTURE (*) NEGATIVE Final   Comment: RESULT CALLED TO, READ BACK  BY AND VERIFIED WITH:     V.STEWART,RN AT 0303 ON 08/18/14 BY SHEAW  The Xpert SA Assay (FDA     approved for NASAL specimens     in patients over 33 years of age),     is one component of     a comprehensive surveillance     program.  Test performance has     been validated by [employer] for patients greater     than or equal to 41 year old.     It is not intended     to diagnose infection nor to     guide or monitor treatment.    Medical History: Past Medical History  Diagnosis Date  . DM (diabetes mellitus) 10/2009  . COPD (chronic obstructive pulmonary disease)   . Abnormal chest xray     last XR 11-2010, no further  f/u CXRs  . Syncope 2010    saw cards   . Hyperlipidemia   . Hypertension   . Prostate cancer 2008    s/p XRT  . ED (erectile dysfunction)   . 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  . Personal history of colonic polyps     none in 2010    Medications:  Infusions:   Anti-infectives   Start     Dose/Rate Route Frequency Ordered Stop   08/18/14 0600  ampicillin-sulbactam (UNASYN) 1.5 g in sodium chloride 0.9 % 50 mL IVPB  Status:  Discontinued     1.5 g 100 mL/hr over 30 Minutes Intravenous On call to O.R. 08/17/14 1841 08/18/14 0800   08/13/14 1200  ampicillin-sulbactam (UNASYN) 1.5 g in sodium chloride 0.9 % 50 mL IVPB  Status:  Discontinued     1.5 g 100 mL/hr over 30 Minutes Intravenous 4 times per day 08/13/14 1104 08/18/14 0800   08/12/14 0800  levofloxacin (LEVAQUIN) IVPB 750 mg  Status:  Discontinued     750 mg 100 mL/hr over 90 Minutes Intravenous Every 24 hours 08/12/14 0746 08/13/14 1104      Reuel Boom, PharmD Pager: [phone] 08/18/2014, 9:00 AM

## 2014-08-18 NOTE — Progress Notes (Signed)
At around 0430 pt sounded as if he was in distress. Upon entering room, found pt in bed stating that he "had the chills". Oral temp was 100.4 F. Pt had increased work of breathing, diaphoretic, and was tachypneic. O2 sat was 81% on 3L [place]. Called rapid response and hospitalist notified; hospitalist came to see pt at bedside. O2 changed to NRB; O2 sats increased to 100% but BP remained high and pt still had increased work of breathing. Pt received duonebs treatment, labs obtained as well as CXR and EKG. Administered 40mg  Lasix IV as ordered. Pt transferred to stepdown unit; family Kol [employer]) notified of situation and relocation of pt.

## 2014-08-18 NOTE — Progress Notes (Signed)
[facility]  [address] [place], [place] [address] Phone: [phone] FAX: [place] [phone] 07-Dec-1931  CARE TEAM:  PCP: [name], MD  Outpatient Care Team: Patient Care Team: Colon Branch, MD as PCP - General  Inpatient Treatment Team: Treatment Team: Attending Provider: Thurnell Lose, MD; Technician: Allena Earing Shiver, NT; Attending Physician: Robbie Lis, MD; Registered Nurse: Pryor Curia, RN; Registered Nurse: Junior Marga Melnick, RN; Technician: Naomie Dean, NT; Registered Nurse: Verlon Au, RN; Consulting Physician: Nolon Nations, MD; Rounding Team: Garner Gavel, MD; Registered Nurse: Dorene Grebe, RN   Subjective:  Worsening oxygenation - transferred to ICU Sister & friend in room Denies pain  Objective:  Vital signs:  Filed Vitals:   08/18/14 0524 08/18/14 0600 08/18/14 0637 08/18/14 0700  BP: 195/80 182/69  163/81  Pulse: 82 72 80 65  Temp: 98.4 F (36.9 C)     TempSrc: Axillary     Resp: _0 Height: _1  (1.778 m)     Weight: 185 lb 13.6 oz (84.3 kg)     SpO2: 100% 99% 98% 97%    Last BM Date: 08/16/14  Intake/Output   Yesterday:  09/24 0701 - 09/25 0700 In: 623.7 [P.O.:360; I.V.:113.7; IV Piggyback:150] Out: 1700 [Urine:1700] This shift:    +10.5L since admit  Bowel function:  Flatus: y  BM: y  Drain: n/a  Physical Exam:  General: Pt awake/alert/oriented x4 in no acute distress Eyes: PERRL, normal EOM.  Sclera clear.  No icterus Neuro: CN II-XII intact w/o focal sensory/motor deficits. Lymph: No head/neck/groin lymphadenopathy Psych:  No delerium/psychosis/paranoia HENT: Normocephalic, Mucus membranes moist.  No thrush Neck: Supple, No tracheal deviation Chest: No chest wall pain w good excursion.  Mod crackles & mild conv dyspnea CV:  Pulses intact.  Regular rhythm MS: Normal AROM mjr joints.  No obvious  deformity Abdomen: Soft.  Mod distended.  Nontender.  No evidence of peritonitis.  No incarcerated hernias. Ext:  SCDs BLE.  No mjr edema.  No cyanosis Skin: No petechiae / purpura   Problem List:   Principal Problem:   Acute respiratory failure with hypoxia Active Problems:   HYPERLIPIDEMIA   HYPERTENSION   COPD (chronic obstructive pulmonary disease)   Acute pancreatitis   CAP (community acquired pneumonia)   Leukocytosis   Abnormal LFTs   Renal mass   Type II or unspecified type diabetes mellitus without mention of complication, not stated as uncontrolled   Nonspecific (abnormal) findings on radiological and other examination of biliary tract   Acute-on-chronic respiratory failure   Aspiration pneumonitis   Choledocholithiasis   Assessment  Jim [name]  80 y.o. male  2 Days Post-Op  Procedure(s): ENDOSCOPIC RETROGRADE CHOLANGIOPANCREATOGRAPHY (ERCP)  Worsening hypoxia prob from vol overkload needed for pancreatitis  ?poss post ERCP pancreatitis w GS pancreatitis?  Plan:  -NO OR today -diuresis for resp failure w COPD flare -tried to discourage pt from leaving AMA - more willing to stay but doesn't like to be in hospitals... -lap chole when more medically stable in 1-2 days:  The anatomy & physiology of hepatobiliary & pancreatic function was discussed.  The pathophysiology of gallbladder dysfunction was discussed.  Natural history risks without surgery was discussed.   I feel the risks of no intervention will lead to serious problems that outweigh the operative risks; therefore, I recommended cholecystectomy to remove the pathology.  I explained  laparoscopic techniques with possible need for an open approach.  Probable cholangiogram to evaluate the bilary tract was explained as well.    Risks such as bleeding, infection, abscess, leak, injury to other organs, need for further treatment, heart attack, death, and other risks were discussed.  I noted a good  likelihood this will help address the problem.  Possibility that this will not correct all abdominal symptoms was explained.  Goals of post-operative recovery were discussed as well.  We will work to minimize complications.  An educational handout further explaining the pathology and treatment options was given as well.  Questions were answered.  The patient & sister expresses understanding & wishes to proceed with surgery eventually.    -VTE prophylaxis- SCDs, etc -mobilize as tolerated to help recovery  Jim [name], M.D., F.A.C.S. Gastrointestinal and Minimally Invasive Surgery [facility] [address] [place], [place] [address] [phone] Main / Paging   08/18/2014   Results:   Labs: Results for orders placed during the hospital encounter of 08/12/14 (from the past 48 hour(s))  GLUCOSE, CAPILLARY     Status: Abnormal   Collection Time    08/16/14  7:38 AM      Result Value Ref Range   Glucose-Capillary 121 (*) 70 - 99 mg/dL  GLUCOSE, CAPILLARY     Status: Abnormal   Collection Time    08/16/14 10:42 AM      Result Value Ref Range   Glucose-Capillary 115 (*) 70 - 99 mg/dL  GLUCOSE, CAPILLARY     Status: Abnormal   Collection Time    08/16/14 12:15 PM      Result Value Ref Range   Glucose-Capillary 136 (*) 70 - 99 mg/dL  GLUCOSE, CAPILLARY     Status: Abnormal   Collection Time    08/16/14  4:54 PM      Result Value Ref Range   Glucose-Capillary 138 (*) 70 - 99 mg/dL  GLUCOSE, CAPILLARY     Status: Abnormal   Collection Time    08/16/14  9:58 PM      Result Value Ref Range   Glucose-Capillary 140 (*) 70 - 99 mg/dL  LIPASE, BLOOD     Status: Abnormal   Collection Time    08/17/14  5:07 AM      Result Value Ref Range   Lipase 65 (*) 11 - 59 U/L  CBC     Status: Abnormal   Collection Time    08/17/14  5:07 AM      Result Value Ref Range   WBC 10.3  4.0 - 10.5 K/uL   RBC 3.59 (*) 4.22 - 5.81 MIL/uL   Hemoglobin 11.4 (*)  13.0 - 17.0 g/dL   HCT 32.6 (*) 39.0 - 52.0 %   MCV 90.8  78.0 - 100.0 fL   MCH 31.8  26.0 - 34.0 pg   MCHC 35.0  30.0 - 36.0 g/dL   RDW 12.7  11.5 - 15.5 %   Platelets 133 (*) 150 - 400 K/uL  COMPREHENSIVE METABOLIC PANEL     Status: Abnormal   Collection Time    08/17/14  5:07 AM      Result Value Ref Range   Sodium 138  137 - 147 mEq/L   Potassium 3.9  3.7 - 5.3 mEq/L   Chloride 105  96 - 112 mEq/L   CO2 21  19 - 32 mEq/L   Glucose, Bld 129 (*) 70 - 99 mg/dL  BUN 13  6 - 23 mg/dL   Creatinine, Ser 0.75  0.50 - 1.35 mg/dL   Calcium 8.1 (*) 8.4 - 10.5 mg/dL   Total Protein 5.7 (*) 6.0 - 8.3 g/dL   Albumin 2.2 (*) 3.5 - 5.2 g/dL   AST 20  0 - 37 U/L   Comment: SLIGHT HEMOLYSIS     HEMOLYSIS AT THIS LEVEL MAY AFFECT RESULT   ALT 18  0 - 53 U/L   Alkaline Phosphatase 105  39 - 117 U/L   Total Bilirubin 1.3 (*) 0.3 - 1.2 mg/dL   GFR calc non Af Amer 84 (*) >90 mL/min   GFR calc Af Amer >90  >90 mL/min   Comment: (NOTE)     The eGFR has been calculated using the CKD EPI equation.     This calculation has not been validated in all clinical situations.     eGFR's persistently <90 mL/min signify possible Chronic Kidney     Disease.   Anion gap 12  5 - 15  GLUCOSE, CAPILLARY     Status: Abnormal   Collection Time    08/17/14  7:45 AM      Result Value Ref Range   Glucose-Capillary 126 (*) 70 - 99 mg/dL  GLUCOSE, CAPILLARY     Status: Abnormal   Collection Time    08/17/14 11:46 AM      Result Value Ref Range   Glucose-Capillary 168 (*) 70 - 99 mg/dL  GLUCOSE, CAPILLARY     Status: Abnormal   Collection Time    08/17/14  4:41 PM      Result Value Ref Range   Glucose-Capillary 200 (*) 70 - 99 mg/dL  GLUCOSE, CAPILLARY     Status: Abnormal   Collection Time    08/17/14  9:57 PM      Result Value Ref Range   Glucose-Capillary 172 (*) 70 - 99 mg/dL  SURGICAL PCR SCREEN     Status: Abnormal   Collection Time    08/18/14  1:05 AM      Result Value Ref Range   MRSA, PCR  INVALID RESULTS, SPECIMEN SENT FOR CULTURE (*) NEGATIVE   Comment: RESULT CALLED TO, READ BACK BY AND VERIFIED WITH:     V.STEWART,RN AT 0303 ON 08/18/14 BY SHEAW   Staphylococcus aureus INVALID RESULTS, SPECIMEN SENT FOR CULTURE (*) NEGATIVE   Comment: RESULT CALLED TO, READ BACK BY AND VERIFIED WITH:     V.STEWART,RN AT 0303 ON 08/18/14 BY SHEAW                The Xpert SA Assay (FDA     approved for NASAL specimens     in patients over 55 years of age),     is one component of     a comprehensive surveillance     program.  Test performance has     been validated by [employer] for patients greater     than or equal to 12 year old.     It is not intended     to diagnose infection nor to     guide or monitor treatment.  LIPASE, BLOOD     Status: Abnormal   Collection Time    08/18/14  4:35 AM      Result Value Ref Range   Lipase 91 (*) 11 - 59 U/L  COMPREHENSIVE METABOLIC PANEL     Status: Abnormal   Collection Time  08/18/14  4:35 AM      Result Value Ref Range   Sodium 138  137 - 147 mEq/L   Potassium 3.7  3.7 - 5.3 mEq/L   Chloride 104  96 - 112 mEq/L   CO2 21  19 - 32 mEq/L   Glucose, Bld 161 (*) 70 - 99 mg/dL   BUN 13  6 - 23 mg/dL   Creatinine, Ser 0.74  0.50 - 1.35 mg/dL   Calcium 8.4  8.4 - 10.5 mg/dL   Total Protein 6.2  6.0 - 8.3 g/dL   Albumin 2.4 (*) 3.5 - 5.2 g/dL   AST 23  0 - 37 U/L   ALT 21  0 - 53 U/L   Alkaline Phosphatase 130 (*) 39 - 117 U/L   Total Bilirubin 1.0  0.3 - 1.2 mg/dL   GFR calc non Af Amer 85 (*) >90 mL/min   GFR calc Af Amer >90  >90 mL/min   Comment: (NOTE)     The eGFR has been calculated using the CKD EPI equation.     This calculation has not been validated in all clinical situations.     eGFR's persistently <90 mL/min signify possible Chronic Kidney     Disease.   Anion gap 13  5 - 15  CBC     Status: Abnormal   Collection Time    08/18/14  4:35 AM      Result Value Ref Range   WBC 14.3 (*) 4.0 - 10.5 K/uL   RBC 3.85  (*) 4.22 - 5.81 MIL/uL   Hemoglobin 12.3 (*) 13.0 - 17.0 g/dL   HCT 35.8 (*) 39.0 - 52.0 %   MCV 93.0  78.0 - 100.0 fL   MCH 31.9  26.0 - 34.0 pg   MCHC 34.4  30.0 - 36.0 g/dL   RDW 13.1  11.5 - 15.5 %   Platelets 187  150 - 400 K/uL   Comment: DELTA CHECK NOTED     REPEATED TO VERIFY  GLUCOSE, CAPILLARY     Status: Abnormal   Collection Time    08/18/14  4:37 AM      Result Value Ref Range   Glucose-Capillary 133 (*) 70 - 99 mg/dL    Imaging / Studies: Dg Chest Port 1 View  08/18/2014   CLINICAL DATA:  Labored breathing  EXAM: PORTABLE CHEST - 1 VIEW  COMPARISON:  08/13/2014  FINDINGS: New diffuse interstitial prominence with small pleural effusions. An apparent cavity in the right upper lung is likely scarring based on previous imaging. Unchanged normal heart size. Unchanged aortic tortuosity. No pneumothorax.  IMPRESSION: New pulmonary edema and small pleural effusions.   Electronically Signed   By: Jorje Guild M.D.   On: 08/18/2014 05:02   Dg Ercp Biliary & Pancreatic Ducts  08/16/2014   CLINICAL DATA:  Choledocholithiasis  EXAM: ERCP with balloon sweep and sphincterotomy  TECHNIQUE: Multiple spot images obtained with the fluoroscopic device and submitted for interpretation post-procedure.  COMPARISON:  08/14/2014  FINDINGS: Limited spot fluoroscopic views during ERCP demonstrate cannulation, guidewire access and contrast injection of the common bile duct. Mild CBD dilatation. Balloon sweep of the common bile duct performed. Distal CBD filling defect initially noted. Apparently the procedure was successful at stone removal. Please refer to the operative note for details of stone removal.  FLUOROSCOPY TIME:  3.3 min  IMPRESSION: Limited imaging during ERCP with balloon sweep and sphincterotomy for stone removal.  These images were submitted for radiologic  interpretation only. Please see the procedural report for the amount of contrast and the fluoroscopy time utilized.   Electronically  Signed   By: Daryll Brod M.D.   On: 08/16/2014 10:41    Medications / Allergies: per chart  Antibiotics: Anti-infectives   Start     Dose/Rate Route Frequency Ordered Stop   08/18/14 0600  ampicillin-sulbactam (UNASYN) 1.5 g in sodium chloride 0.9 % 50 mL IVPB     1.5 g 100 mL/hr over 30 Minutes Intravenous On call to O.R. 08/17/14 1841 08/19/14 0559   08/13/14 1200  ampicillin-sulbactam (UNASYN) 1.5 g in sodium chloride 0.9 % 50 mL IVPB     1.5 g 100 mL/hr over 30 Minutes Intravenous 4 times per day 08/13/14 1104     08/12/14 0800  levofloxacin (LEVAQUIN) IVPB 750 mg  Status:  Discontinued     750 mg 100 mL/hr over 90 Minutes Intravenous Every 24 hours 08/12/14 0746 08/13/14 1104       Note: Portions of this report may have been transcribed using voice recognition software. Every effort was made to ensure accuracy; however, inadvertent computerized transcription errors may be present.   Any transcriptional errors that result from this process are unintentional.

## 2014-08-18 NOTE — Progress Notes (Signed)
Pt doing satisfactory at this time.  Per plan, weaned Pt to 50% FiO2 venturi mask.

## 2014-08-19 ENCOUNTER — Inpatient Hospital Stay (HOSPITAL_COMMUNITY): Payer: Medicare Other

## 2014-08-19 DIAGNOSIS — I5032 Chronic diastolic (congestive) heart failure: Secondary | ICD-10-CM

## 2014-08-19 DIAGNOSIS — J449 Chronic obstructive pulmonary disease, unspecified: Secondary | ICD-10-CM

## 2014-08-19 LAB — COMPREHENSIVE METABOLIC PANEL
ALBUMIN: 2.2 g/dL — AB (ref 3.5–5.2)
ALT: 17 U/L (ref 0–53)
AST: 19 U/L (ref 0–37)
Alkaline Phosphatase: 100 U/L (ref 39–117)
Anion gap: 12 (ref 5–15)
BUN: 13 mg/dL (ref 6–23)
CALCIUM: 8 mg/dL — AB (ref 8.4–10.5)
CO2: 25 mEq/L (ref 19–32)
CREATININE: 0.82 mg/dL (ref 0.50–1.35)
Chloride: 99 mEq/L (ref 96–112)
GFR calc Af Amer: >90 mL/min (ref >90)
GFR calc non Af Amer: 81 mL/min — ABNORMAL LOW (ref >90)
Glucose, Bld: 132 mg/dL — ABNORMAL HIGH (ref 70–99)
Potassium: 3 mEq/L — ABNORMAL LOW (ref 3.7–5.3)
Sodium: 136 mEq/L — ABNORMAL LOW (ref 137–147)
Total Bilirubin: 1.2 mg/dL (ref 0.3–1.2)
Total Protein: 5.7 g/dL — ABNORMAL LOW (ref 6.0–8.3)

## 2014-08-19 LAB — GLUCOSE, CAPILLARY
GLUCOSE-CAPILLARY: 138 mg/dL — AB (ref 70–99)
GLUCOSE-CAPILLARY: 115 mg/dL — AB (ref 70–99)
Glucose-Capillary: 133 mg/dL — ABNORMAL HIGH (ref 70–99)
Glucose-Capillary: 148 mg/dL — ABNORMAL HIGH (ref 70–99)

## 2014-08-19 LAB — CBC
HEMATOCRIT: 30.3 % — AB (ref 39.0–52.0)
HEMOGLOBIN: 10.8 g/dL — AB (ref 13.0–17.0)
MCH: 32.3 pg (ref 26.0–34.0)
MCHC: 35.6 g/dL (ref 30.0–36.0)
MCV: 90.7 fL (ref 78.0–100.0)
Platelets: 195 10*3/uL (ref 150–400)
RBC: 3.34 MIL/uL — AB (ref 4.22–5.81)
RDW: 13.1 % (ref 11.5–15.5)
WBC: 9.1 10*3/uL (ref 4.0–10.5)

## 2014-08-19 LAB — MAGNESIUM: MAGNESIUM: 1.9 mg/dL (ref 1.5–2.5)

## 2014-08-19 LAB — LIPASE, BLOOD: LIPASE: 74 U/L — AB (ref 11–59)

## 2014-08-19 MED ORDER — SODIUM CHLORIDE 0.9 % IV SOLN
INTRAVENOUS | Status: DC
  Administered 2014-08-19 – 2014-08-22 (×2): via INTRAVENOUS

## 2014-08-19 MED ORDER — POTASSIUM CHLORIDE CRYS ER 20 MEQ PO TBCR
40.0000 meq | EXTENDED_RELEASE_TABLET | Freq: Once | ORAL | Status: AC
  Administered 2014-08-19: 40 meq via ORAL
  Filled 2014-08-19: qty 2

## 2014-08-19 MED ORDER — FUROSEMIDE 10 MG/ML IJ SOLN
40.0000 mg | Freq: Two times a day (BID) | INTRAMUSCULAR | Status: DC
  Administered 2014-08-19 – 2014-08-20 (×3): 40 mg via INTRAVENOUS
  Filled 2014-08-19 (×5): qty 4

## 2014-08-19 MED ORDER — POTASSIUM CHLORIDE 10 MEQ/100ML IV SOLN
10.0000 meq | INTRAVENOUS | Status: AC
  Administered 2014-08-19 (×3): 10 meq via INTRAVENOUS
  Filled 2014-08-19 (×3): qty 100

## 2014-08-19 NOTE — Progress Notes (Signed)
Pt transferred to rm [id number]. Report called and given to [place], rn. All questions answered. Vwilliams,rn.

## 2014-08-19 NOTE — Progress Notes (Signed)
[facility] PROGRESS NOTE  BECKETT [name] DGL:[id number] DOB: [birth year] DOA: 08/12/2014 PCP: [name], MD  Brief Summary  Interim summary  80 year old male with past medical history of hypertension, dyslipidemia, diabetes, chronic diastolic CHF, history of prostate cancer who presented to [facility] ED 08/12/2014 with worsening abdominal pain over past 24 hours prior to this admission. Patient explains pain is in the upper and mid abdomen associated with multiple episodes of nonbloody vomiting. In emergency department, the patient was noted to have a lipase >3000, AST 65, ALT 90, ALT 187, total bilirubin 6.5. BNP was 772, WBC 15.6. CT abdomen showed focal midline retroperitoneal inflammation unclear if this is sequela of duodenitis or possible pancreatitis, possible mid to proximal small bowel wall thickening, non-obstructing sub-centimeters distal common bile duct lesion which could reflect polyp or less likely neoplasm, 16 mm solid left lower pole renal mass highly concerning for renal cell carcinoma.  The patient was placed on bowel rest and started on intravenous fluids for his acute pancreatitis. Gastroenterology was consulted. MRCP was performed as well as MRI of the abdomen to clarify his left renal mass. The MRCP revealed a distal common bile duct filling defect concerning for a stone. MRI of the abdomen revealed a suspicious left renal lesion concerning for renal cell carcinoma. Urology has been consulted, and felt due to slow growing nature of these masses, pt can follow up as an outpatient. Gastroenterology plans for ERCP on 08/16/2014. The patient continues on Unasyn for empiric treatment of aspiration pneumonitis and possible cholangitis.  Transferred to stepdown on 9/25 for increasing respiratory distress from pulmonary edema.    Assessment/Plan  Acute on chronic respiratory failure,  Improved with diuresis yesterday.  Likely multifactorial from COPD, aspiration pneumonia, and acute  diastolic heart failure, now back to home oxygen level of 2.5 to 3L.   -  ECHO:  pending -  Continue lasix 40mg  IV bid -  Wean oxygen to 2.5L -  Continue zosyn and bronchodilators -  Will try to avoid systemic steroids due to underlying infection -  Defer surgery for today while continuing diuresis, but anticipate he will be ready for OR in 1-2 days.   -  Tele:  NSR  Acute pancreatitis with CBD stone  --LFTs, lipase improving  --MRCP--distant, bowel duct filling defect suspicious for periampullary stone  - ERCP done on 08/16/14 shows Choledocholiathiasis, treated with biliary sphincterotomy and balloon sweeping. - CCS consulted:  Planning cholecystectomy when medically stable  Klebsiella bacteremia, likely seeded from cholangitis/obstructing stone, sensitive to unasyn but not ampicillin -  Change to zosyn day 2 -  Temperature and WBC trending down  Renal mass suspicious for RCC based on MRI appearance -Appreciate urology consult-->followup with Dr. Consuella Lose as outpatient   COPD with increased WOB which is more likely secondary to acute diastolic heart failure than COPD. -  Continue nebulized LABA and ICS today - Continue albuterol and Atrovent  -  Already on abx -  Will try to void systemic steroids but if not improving with diuresis, may need to start steroids  Hypertension, labile BPs -  Continue carvedilol and losartan  -  Continue prn hydralazine  Diabetes mellitus type 2, CBG well controlled - 08/12/2014 hemoglobin A1c 7.0 -  Cont to hold metformin -  Continue ISS  Hypokalemia due to diuresis -  Continue potassium supplementation with oral and IV potassium  Normocytic anemia due to chronic disease and acute illness -  Hemoglobin trending down -  Monitor for bleeding -  tx for hgb < 7  Diet:  Diabetic diet Access:  PIV IVF:  off Proph:  SCDs   Code Status: full Family Communication: patient, his brother and sister Disposition Plan:  Transfer back to  [employer] [facility]   Consultants:  General surgery, Dr. Johney Maine  GI, Dr. Carlean Purl  Urology, Dr. Junious Silk  Procedures:  9/20 CXR:  NAD  9/19 CXR:  Scarring, possibly mild pneumonia or atelectasis, mild vascular congestion  9/19 CT abd/pelvis:  Focal midline retroperitoneal inflammation, unclear if this is reflects sequelae duodenitis or possible pancreatitis. Suspected mild proximal small bowel wall thickening. Nonobstructing subcentimeter distal Common bile duct lesion, which could reflect polyp or less likely neoplasm, possible sludge ball. Recommend MRCP on a nonemergent basis. Small hiatal hernia with moderate amount of associated mildly inflamed fat, which is possibly tracking from the retroperitoneum. 16 mm solid LEFT lower pole renal mass highly concerning for renal cell carcinoma  9/21 MRCP:   Filling defect within the distal common duct, highly suspicious for periampullary stone. This is positioned more distally than on 08/12/14 CT. Mild secondary biliary ductal dilatation. 3. 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 4. Progressive moderate pancreatitis  ERCP 08-16-14  The CBD was dilated diffusely up to 60mm and there was a round filling defect in distal bile duct. An adequate biliary sphincterotomy was performed over the biliary wire and then a biliary retrieval balloon was used to sweep the bile ducts. A single 60mm greenish stone was delivered into the duodenum. There was no purulence. A completion, occlusion cholangiogram showed no further filling defects. The main pancreatic duct was never cannulated or injected with dye  Antibiotics: Levofloxacin 9/19>>9/20  unasyn 08/13/14>>> 9/25 Zosyn 9/25 >>    HPI/Subjective:  Much less SOB today and swelling has improved. + voiding and having BMs.  Was glad to eat yesterday  Objective: Filed Vitals:   08/19/14 0200 08/19/14 0400 08/19/14 0500 08/19/14 0600  BP: 156/83 158/80  181/76   Pulse: 68 81  82  Temp:      TempSrc:      Resp: 22 22  21   Height:      Weight:   82.8 kg (182 lb 8.7 oz)   SpO2: 97% 95%  97%    Intake/Output Summary (Last 24 hours) at 08/19/14 0738 Last data filed at 08/19/14 0415  Gross per 24 hour  Intake    150 ml  Output   2250 ml  Net  -2100 ml   Filed Weights   08/17/14 0530 08/18/14 0524 08/19/14 0500  Weight: 82.921 kg (182 lb 12.9 oz) 84.3 kg (185 lb 13.6 oz) 82.8 kg (182 lb 8.7 oz)    Exam:   General:  WM, No acute distress, 3L [place]  HEENT:  NCAT, MMM  Cardiovascular:  RRR, nl S1, S2 no mrg, 2+ pulses, warm extremities  Respiratory:  CTAB, no increased WOB  Abdomen:   NABS, soft, ND, NT  MSK:   Normal tone and bulk, 1+ bilateral LEE  Neuro:  Grossly intact  Data Reviewed: Basic Metabolic Panel:  Recent Labs Lab 08/14/14 0437 08/16/14 0335 08/17/14 0507 08/18/14 0435 08/19/14 0500  NA 136* 136* 138 138 136*  K 3.8 3.3* 3.9 3.7 3.0*  CL 101 102 105 104 99  CO2 21 20 21 21 25   GLUCOSE 158* 116* 129* 161* 132*  BUN 14 11 13 13 13   CREATININE 0.80 0.72 0.75 0.74 0.82  CALCIUM 8.2* 8.0* 8.1*  8.4 8.0*  MG  --   --   --   --  1.9   Liver Function Tests:  Recent Labs Lab 08/14/14 0437 08/16/14 0335 08/17/14 0507 08/18/14 0435 08/19/14 0500  AST 22 15 20 23 19   ALT 33 19 18 21 17   ALKPHOS 123* 110 105 130* 100  BILITOT 2.1* 1.4* 1.3* 1.0 1.2  PROT 6.0 5.5* 5.7* 6.2 5.7*  ALBUMIN 2.6* 2.2* 2.2* 2.4* 2.2*    Recent Labs Lab 08/15/14 0435 08/16/14 0335 08/17/14 0507 08/18/14 0435 08/19/14 0500  LIPASE 68* 60* 65* 91* 74*   No results found for this basename: AMMONIA,  in the last 168 hours CBC:  Recent Labs Lab 08/14/14 0437 08/16/14 0335 08/17/14 0507 08/18/14 0435 08/19/14 0500  WBC 10.1 10.5 10.3 14.3* 9.1  HGB 11.6* 10.8* 11.4* 12.3* 10.8*  HCT 34.7* 31.7* 32.6* 35.8* 30.3*  MCV 94.6 92.4 90.8 93.0 90.7  PLT 120* 126* 133* 187 195   Cardiac Enzymes: No results found for this  basename: CKTOTAL, CKMB, CKMBINDEX, TROPONINI,  in the last 168 hours BNP (last 3 results)  Recent Labs  08/12/14 0408  PROBNP 772.2*   CBG:  Recent Labs Lab 08/18/14 0437 08/18/14 0736 08/18/14 1107 08/18/14 1617 08/18/14 2213  GLUCAP 133* 159* 130* 121* 125*    Recent Results (from the past 240 hour(s))  CULTURE, BLOOD (ROUTINE X 2)     Status: None   Collection Time    08/12/14  9:19 AM      Result Value Ref Range Status   Specimen Description BLOOD RIGHT ARM   Final   Special Requests     Final   Value: BOTTLES DRAWN AEROBIC AND ANAEROBIC 10CC BLUE BOTTLE, 7 CC RED BOTTLE   Culture  Setup Time     Final   Value: 08/12/2014 13:42     Performed at [facility]   Culture     Final   Value: NO GROWTH 5 DAYS     Performed at [facility]   Report Status 08/18/2014 FINAL   Final  CULTURE, BLOOD (ROUTINE X 2)     Status: None   Collection Time    08/12/14  9:24 AM      Result Value Ref Range Status   Specimen Description BLOOD RIGHT ARM   Final   Special Requests     Final   Value: BOTTLES DRAWN AEROBIC AND ANAEROBIC 10CC BOTH BOTTLES   Culture  Setup Time     Final   Value: 08/12/2014 13:42     Performed at [facility]   Culture     Final   Value: KLEBSIELLA PNEUMONIAE     Note: Gram Stain Report Called to,Read Back By and Verified With: Trinna Balloon ON 08/16/2014 AT 11:55P BY WILEJ     Performed at [facility]   Report Status 08/18/2014 FINAL   Final   Organism ID, Bacteria KLEBSIELLA PNEUMONIAE   Final  CULTURE, EXPECTORATED SPUTUM-ASSESSMENT     Status: None   Collection Time    08/13/14  1:56 AM      Result Value Ref Range Status   Specimen Description SPUTUM   Final   Special Requests NONE   Final   Sputum evaluation     Final   Value: MICROSCOPIC FINDINGS SUGGEST THAT THIS SPECIMEN IS NOT REPRESENTATIVE OF LOWER RESPIRATORY SECRETIONS. PLEASE RECOLLECT.     V STEWART AT 0216 ON 09.20.2015 BY NBROOKS   Report  Status  08/13/2014 FINAL   Final  CULTURE, EXPECTORATED SPUTUM-ASSESSMENT     Status: None   Collection Time    08/13/14  6:47 AM      Result Value Ref Range Status   Specimen Description SPUTUM   Final   Special Requests NONE   Final   Sputum evaluation     Final   Value: THIS SPECIMEN IS ACCEPTABLE. RESPIRATORY CULTURE REPORT TO FOLLOW.   Report Status 08/13/2014 FINAL   Final  CULTURE, RESPIRATORY (NON-EXPECTORATED)     Status: None   Collection Time    08/13/14  6:47 AM      Result Value Ref Range Status   Specimen Description SPUTUM   Final   Special Requests NONE   Final   Gram Stain     Final   Value: FEW WBC PRESENT, PREDOMINANTLY MONONUCLEAR     RARE SQUAMOUS EPITHELIAL CELLS PRESENT     RARE GRAM POSITIVE COCCI     IN PAIRS IN CLUSTERS RARE GRAM NEGATIVE RODS     Performed at [facility]   Culture     Final   Value: NORMAL OROPHARYNGEAL FLORA     Performed at [facility]   Report Status 08/15/2014 FINAL   Final  URINE CULTURE     Status: None   Collection Time    08/13/14  1:58 PM      Result Value Ref Range Status   Specimen Description URINE, CATHETERIZED   Final   Special Requests NONE   Final   Culture  Setup Time     Final   Value: 08/13/2014 19:11     Performed at [facility]     Final   Value: NO GROWTH     Performed at [facility]   Culture     Final   Value: NO GROWTH     Performed at [facility]   Report Status 08/14/2014 FINAL   Final  SURGICAL PCR SCREEN     Status: Abnormal   Collection Time    08/18/14  1:05 AM      Result Value Ref Range Status   MRSA, PCR INVALID RESULTS, SPECIMEN SENT FOR CULTURE (*) NEGATIVE Final   Comment: RESULT CALLED TO, READ BACK BY AND VERIFIED WITH:     V.STEWART,RN AT 0303 ON 08/18/14 BY SHEAW   Staphylococcus aureus INVALID RESULTS, SPECIMEN SENT FOR CULTURE (*) NEGATIVE Final   Comment: RESULT CALLED TO, READ BACK BY AND VERIFIED WITH:     V.STEWART,RN AT 0303  ON 08/18/14 BY SHEAW                The Xpert SA Assay (FDA     approved for NASAL specimens     in patients over 26 years of age),     is one component of     a comprehensive surveillance     program.  Test performance has     been validated by [employer] for patients greater     than or equal to 8 year old.     It is not intended     to diagnose infection nor to     guide or monitor treatment.     Studies: Dg Chest Port 1 View  08/19/2014   CLINICAL DATA:  Follow up pulmonary edema.  EXAM: PORTABLE CHEST - 1 VIEW  COMPARISON:  08/18/2014 and 12/23/2010  FINDINGS: There  continues to be prominent interstitial lung markings suggesting interstitial edema. However, there is improving aeration at the lung bases. Stable linear densities in the right upper chest are compatible with an area of scarring. The heart size is normal. There is persistent consolidation in the retrocardiac space. Suspect left pleural fluid.  IMPRESSION: Slightly decreased interstitial edema.  Persistent consolidation at the left lung base and suspect left pleural fluid.   Electronically Signed   By: Markus Daft M.D.   On: 08/19/2014 06:54   Dg Chest Port 1 View  08/18/2014   CLINICAL DATA:  Labored breathing  EXAM: PORTABLE CHEST - 1 VIEW  COMPARISON:  08/13/2014  FINDINGS: New diffuse interstitial prominence with small pleural effusions. An apparent cavity in the right upper lung is likely scarring based on previous imaging. Unchanged normal heart size. Unchanged aortic tortuosity. No pneumothorax.  IMPRESSION: New pulmonary edema and small pleural effusions.   Electronically Signed   By: Jorje Guild M.D.   On: 08/18/2014 05:02    Scheduled Meds: . antiseptic oral rinse  7 mL Mouth Rinse q12n4p  . arformoterol  15 mcg Nebulization BID  . aspirin EC  81 mg Oral Daily  . atorvastatin  20 mg Oral q1800  . budesonide (PULMICORT) nebulizer solution  0.5 mg Nebulization BID  . carvedilol  25 mg Oral BID WC  .  chlorhexidine  1 application Topical Once  . chlorhexidine  15 mL Mouth Rinse BID  . guaiFENesin  600 mg Oral Daily  . insulin aspart  0-9 Units Subcutaneous TID WC  . ipratropium-albuterol  3 mL Nebulization QID  . isosorbide-hydrALAZINE  1 tablet Oral TID  . lip balm  1 application Topical BID  . losartan  50 mg Oral Daily  . piperacillin-tazobactam (ZOSYN)  IV  3.375 g Intravenous 3 times per day  . potassium chloride  10 mEq Intravenous Q1 Hr x 3  . potassium chloride  40 mEq Oral Once  . saccharomyces boulardii  250 mg Oral BID  . sodium chloride  3 mL Intravenous Q12H   Continuous Infusions:   Principal Problem:   Acute respiratory failure with hypoxia Active Problems:   HYPERLIPIDEMIA   HYPERTENSION   COPD (chronic obstructive pulmonary disease)   Acute pancreatitis   CAP (community acquired pneumonia)   Leukocytosis   Abnormal LFTs   Renal mass   Type II or unspecified type diabetes mellitus without mention of complication, not stated as uncontrolled   Nonspecific (abnormal) findings on radiological and other examination of biliary tract   Acute-on-chronic respiratory failure   Aspiration pneumonitis   Choledocholithiasis    Time spent: 30 min    Hazyl Marseille, [facility] Pager [phone]. If 7PM-7AM, please contact night-coverage at [email or web address], password [facility] 08/19/2014, 7:38 AM  LOS: 7 days

## 2014-08-19 NOTE — Progress Notes (Signed)
3 Days Post-Op  Subjective: No complaints. Denies n/v/abd pain. IS about 1200.   Objective: Vital signs in last 24 hours: Temp:  [98.5 F (36.9 C)-99.8 F (37.7 C)] 99.2 F (37.3 C) (09/26 0000) Pulse Rate:  [32-82] 82 (09/26 0600) Resp:  [12-27] 21 (09/26 0600) BP: (106-197)/(51-86) 181/76 mmHg (09/26 0600) SpO2:  [88 %-100 %] 97 % (09/26 0600) FiO2 (%):  [45 %-50 %] 45 % (09/25 1000) Weight:  [182 lb 8.7 oz (82.8 kg)] 182 lb 8.7 oz (82.8 kg) (09/26 0500) Last BM Date: 08/19/14  Intake/Output from previous day: 09/25 0701 - 09/26 0700 In: 150 [IV Piggyback:150] Out: 2250 [Urine:2250] Intake/Output this shift:   Alert, nad, nontoxic Some coarse bs b/l Soft, nt, nd  Lab Results:   Recent Labs  08/18/14 0435 08/19/14 0500  WBC 14.3* 9.1  HGB 12.3* 10.8*  HCT 35.8* 30.3*  PLT 187 195   BMET  Recent Labs  08/18/14 0435 08/19/14 0500  NA 138 136*  K 3.7 3.0*  CL 104 99  CO2 21 25  GLUCOSE 161* 132*  BUN 13 13  CREATININE 0.74 0.82  CALCIUM 8.4 8.0*   PT/INR No results found for this basename: LABPROT, INR,  in the last 72 hours ABG No results found for this basename: PHART, PCO2, PO2, HCO3,  in the last 72 hours  Studies/Results: Dg Chest Port 1 View  08/19/2014   CLINICAL DATA:  Follow up pulmonary edema.  EXAM: PORTABLE CHEST - 1 VIEW  COMPARISON:  08/18/2014 and 12/23/2010  FINDINGS: There continues to be prominent interstitial lung markings suggesting interstitial edema. However, there is improving aeration at the lung bases. Stable linear densities in the right upper chest are compatible with an area of scarring. The heart size is normal. There is persistent consolidation in the retrocardiac space. Suspect left pleural fluid.  IMPRESSION: Slightly decreased interstitial edema.  Persistent consolidation at the left lung base and suspect left pleural fluid.   Electronically Signed   By: Markus Daft M.D.   On: 08/19/2014 06:54   Dg Chest Port 1  View  08/18/2014   CLINICAL DATA:  Labored breathing  EXAM: PORTABLE CHEST - 1 VIEW  COMPARISON:  08/13/2014  FINDINGS: New diffuse interstitial prominence with small pleural effusions. An apparent cavity in the right upper lung is likely scarring based on previous imaging. Unchanged normal heart size. Unchanged aortic tortuosity. No pneumothorax.  IMPRESSION: New pulmonary edema and small pleural effusions.   Electronically Signed   By: Jorje Guild M.D.   On: 08/18/2014 05:02    Anti-infectives: Anti-infectives   Start     Dose/Rate Route Frequency Ordered Stop   08/18/14 0930  piperacillin-tazobactam (ZOSYN) IVPB 3.375 g     3.375 g 12.5 mL/hr over 240 Minutes Intravenous 3 times per day 08/18/14 0915     08/18/14 0600  ampicillin-sulbactam (UNASYN) 1.5 g in sodium chloride 0.9 % 50 mL IVPB  Status:  Discontinued     1.5 g 100 mL/hr over 30 Minutes Intravenous On call to O.R. 08/17/14 1841 08/18/14 0800   08/13/14 1200  ampicillin-sulbactam (UNASYN) 1.5 g in sodium chloride 0.9 % 50 mL IVPB  Status:  Discontinued     1.5 g 100 mL/hr over 30 Minutes Intravenous 4 times per day 08/13/14 1104 08/18/14 0800   08/12/14 0800  levofloxacin (LEVAQUIN) IVPB 750 mg  Status:  Discontinued     750 mg 100 mL/hr over 90 Minutes Intravenous Every 24 hours 08/12/14 0746 08/13/14  1104      Assessment/Plan: s/p Procedure(s): ENDOSCOPIC RETROGRADE CHOLANGIOPANCREATOGRAPHY (ERCP) (N/A)  Principal Problem:   Acute respiratory failure with hypoxia Active Problems:   HYPERLIPIDEMIA   HYPERTENSION   COPD (chronic obstructive pulmonary disease)   Acute pancreatitis   CAP (community acquired pneumonia)   Leukocytosis   Abnormal LFTs   Renal mass   Type II or unspecified type diabetes mellitus without mention of complication, not stated as uncontrolled   Nonspecific (abnormal) findings on radiological and other examination of biliary tract   Acute-on-chronic respiratory failure   Aspiration  pneumonitis   Choledocholithiasis   Acute on chronic diastolic heart failure  Cont IV abx for bacteremia (doesn't need for GB per se) Adv diet as tolerated Cont pulm toilet  Not a candidate for OR right now. Pt had ERCP with sphincterotomy so unlikely to have an addl common bile duct stone in near future so therefore no urgency for cholecystectomy. Depending on how pt progresses over next several days with respect to diastolic heart failure and COPD/lungs, may need to defer cholecystectomy to outpt setting. Will follow  [name]. [name], MD, FACS General, Bariatric, & Minimally Invasive Surgery [facility], [place]   LOS: 7 days    Gayland Curry 08/19/2014

## 2014-08-19 NOTE — Progress Notes (Signed)
Occupational Therapy Treatment Patient Details Name: Jim [name] MRN: [id number] DOB: Mar 22, 1932 Today's Date: 08/19/2014    History of present illness Pt is an 80 year old male admitted with abdominal pain, shortness of breath--pancreatitis and 16 mm solid left lower pole renal mass ? CA . PMHx:HTN, DM, CHF, prostrate CA.  Plans per chart for ERCP on 9/23 at 9 am. Underwent ERCP 9/23. Rapid response 9/25 secondary to O2 Sats 81 on 2L sitting EOBwith transfer to ICU. Transfer back to floor 9/26.   OT comments  Pt demonstrates a decline in function after ERCP and rapid response incident. Pt required min A for mobility and ADL. Pt required physical assistance from therapist to prevent fall. Discussed safety concerns with pt, including need to sit as much as possible during ADL, use  RW for stability and to use shower seat to reduce risk of falls, etc. Pt stated that "he will be fine" once he gets home and "don't need to use that stuff". Pt also states that he doesn't need O2 at home. Allowed pt to ambulate ( only 10 feet) in room on RA and O2 desat to 88, in order to show pt how he will need to use his home O2 after D/c. At this time, pt will need initial 24/7 S after D/C. Will continue to follow to address below goals and facilitate safe d/C home.    Follow Up Recommendations  No OT follow up;Supervision/Assistance - 24 hour    Equipment Recommendations  Tub/shower seat    Recommendations for Other Services      Precautions / Restrictions Precautions Precautions: Fall;Other (comment) (watch Os Sats)       Mobility Bed Mobility Overal bed mobility: Modified Independent                Transfers Overall transfer level: Needs assistance Equipment used: 1 person hand held assist Transfers: Sit to/from Stand;Stand Pivot Transfers Sit to Stand: Min guard Stand pivot transfers: Min assist       General transfer comment: poor insight into deficits.     Balance Overall  balance assessment: Needs assistance         Standing balance support: During functional activity;No upper extremity supported Standing balance-Leahy Scale: Poor Standing balance comment: LOB x 3 requiring physical assist from therapist to prevent fall.                   ADL               Lower Body Bathing: Min guard;Sit to/from stand       Lower Body Dressing: Sit to/from stand;Min guard   Toilet Transfer: Minimal assistance   Toileting- Clothing Manipulation and Hygiene: Min guard         General ADL Comments: Pt with decline in funtion since last OT visit due to procedure and rapid response. Pt states he does not wear O2 at home unless needed and doesn't plan on wearing it whe he goes home this time. Pt required Mod A to prevent fall when walking within rooma dn O2 sats dropped to 88 on RA. Educated pt on how his hospitalization has affected his breathing and that he will need to wear O2 most likley  at all times until he returns to [facility]. Also educated on need to sit when doing ADL to save energy and reduce risk of falls.       Vision  Perception     Praxis      Cognition   Behavior During Therapy: WFL for tasks assessed/performed Overall Cognitive Status: No family/caregiver present to determine baseline cognitive functioning (poor insight/awareness into deficits)                       Extremity/Trunk Assessment               Exercises     Shoulder Instructions       General Comments      Pertinent Vitals/ Pain       Pain Assessment: No/denies pain  Home Living                                          Prior Functioning/Environment              Frequency Min 2X/week     Progress Toward Goals  OT Goals(current goals can now be found in the care plan section)  Progress towards OT goals: Progressing toward goals  Acute Rehab OT Goals Patient Stated Goal: home soon to see  his chihuahua OT Goal Formulation: With patient Time For Goal Achievement: 08/26/14 Potential to Achieve Goals: Good ADL Goals Pt Will Perform Grooming: with modified independence;sitting;standing  Plan Discharge plan needs to be updated    Co-evaluation                 End of Session Equipment Utilized During Treatment: Gait belt;Oxygen   Activity Tolerance Patient tolerated treatment well   Patient Left in chair;with call bell/phone within reach;with chair alarm set   Nurse Communication Mobility status        Time: 1354-1419 OT Time Calculation (min): 25 min  Charges: OT General Charges $OT Visit: 1 Procedure OT Treatments $Self Care/Home Management : 23-37 mins  Keonna Raether,HILLARY 08/19/2014, 2:33 PM   [facility], OTR/L  [phone] 08/19/2014

## 2014-08-20 ENCOUNTER — Encounter (HOSPITAL_COMMUNITY): Payer: Self-pay | Admitting: Cardiology

## 2014-08-20 DIAGNOSIS — Z01818 Encounter for other preprocedural examination: Secondary | ICD-10-CM

## 2014-08-20 DIAGNOSIS — I5033 Acute on chronic diastolic (congestive) heart failure: Secondary | ICD-10-CM

## 2014-08-20 LAB — MRSA CULTURE

## 2014-08-20 LAB — GLUCOSE, CAPILLARY
GLUCOSE-CAPILLARY: 147 mg/dL — AB (ref 70–99)
GLUCOSE-CAPILLARY: 191 mg/dL — AB (ref 70–99)
Glucose-Capillary: 137 mg/dL — ABNORMAL HIGH (ref 70–99)
Glucose-Capillary: 155 mg/dL — ABNORMAL HIGH (ref 70–99)

## 2014-08-20 LAB — COMPREHENSIVE METABOLIC PANEL
ALT: 15 U/L (ref 0–53)
AST: 16 U/L (ref 0–37)
Albumin: 2.3 g/dL — ABNORMAL LOW (ref 3.5–5.2)
Alkaline Phosphatase: 91 U/L (ref 39–117)
Anion gap: 14 (ref 5–15)
BUN: 12 mg/dL (ref 6–23)
CALCIUM: 8.1 mg/dL — AB (ref 8.4–10.5)
CO2: 24 meq/L (ref 19–32)
CREATININE: 0.9 mg/dL (ref 0.50–1.35)
Chloride: 98 mEq/L (ref 96–112)
GFR, EST NON AFRICAN AMERICAN: 78 mL/min — AB (ref >90)
GLUCOSE: 123 mg/dL — AB (ref 70–99)
Potassium: 3.3 mEq/L — ABNORMAL LOW (ref 3.7–5.3)
SODIUM: 136 meq/L — AB (ref 137–147)
TOTAL PROTEIN: 5.8 g/dL — AB (ref 6.0–8.3)
Total Bilirubin: 1.1 mg/dL (ref 0.3–1.2)

## 2014-08-20 LAB — CBC
HCT: 30.4 % — ABNORMAL LOW (ref 39.0–52.0)
Hemoglobin: 10.6 g/dL — ABNORMAL LOW (ref 13.0–17.0)
MCH: 31.8 pg (ref 26.0–34.0)
MCHC: 34.9 g/dL (ref 30.0–36.0)
MCV: 91.3 fL (ref 78.0–100.0)
PLATELETS: 244 10*3/uL (ref 150–400)
RBC: 3.33 MIL/uL — ABNORMAL LOW (ref 4.22–5.81)
RDW: 13.2 % (ref 11.5–15.5)
WBC: 10.5 10*3/uL (ref 4.0–10.5)

## 2014-08-20 MED ORDER — FUROSEMIDE 10 MG/ML IJ SOLN
40.0000 mg | Freq: Three times a day (TID) | INTRAMUSCULAR | Status: DC
  Administered 2014-08-20 – 2014-08-22 (×5): 40 mg via INTRAVENOUS
  Filled 2014-08-20 (×9): qty 4

## 2014-08-20 MED ORDER — POTASSIUM CHLORIDE CRYS ER 20 MEQ PO TBCR
40.0000 meq | EXTENDED_RELEASE_TABLET | Freq: Once | ORAL | Status: AC
  Administered 2014-08-20: 40 meq via ORAL
  Filled 2014-08-20: qty 2

## 2014-08-20 NOTE — Progress Notes (Signed)
Pt c/o HA 8/10.  0850 BP 183/142, HR 79. After scheduled IV Lasix and po Coreg, 20 min later BP: 156/74, HR 74. Dr. Sheran Fava notified. Pt also received nebs from RT during this time.  No other s/s observed or reported.  Will continue to monitor with scheduled 1000 meds.

## 2014-08-20 NOTE — Progress Notes (Signed)
[facility] PROGRESS NOTE  GYASI [name] XNA:[id number] DOB: September 19, 1932 DOA: 08/12/2014 PCP: [name], MD  Brief Summary  Interim summary  80 year old male with past medical history of hypertension, dyslipidemia, diabetes, chronic diastolic CHF, history of prostate cancer who presented to [facility] ED 08/12/2014 with worsening abdominal pain over past 24 hours prior to this admission. Patient explains pain is in the upper and mid abdomen associated with multiple episodes of nonbloody vomiting. In emergency department, the patient was noted to have a lipase >3000, AST 65, ALT 90, ALT 187, total bilirubin 6.5. BNP was 772, WBC 15.6. CT abdomen showed focal midline retroperitoneal inflammation unclear if this is sequela of duodenitis or possible pancreatitis, possible mid to proximal small bowel wall thickening, non-obstructing sub-centimeters distal common bile duct lesion which could reflect polyp or less likely neoplasm, 16 mm solid left lower pole renal mass highly concerning for renal cell carcinoma.  The patient was placed on bowel rest and started on intravenous fluids for his acute pancreatitis. Gastroenterology was consulted. MRCP was performed as well as MRI of the abdomen to clarify his left renal mass. The MRCP revealed a distal common bile duct filling defect concerning for a stone. MRI of the abdomen revealed a suspicious left renal lesion concerning for renal cell carcinoma. Urology has been consulted, and felt due to slow growing nature of these masses, pt can follow up as an outpatient. Gastroenterology plans for ERCP on 08/16/2014. The patient continues on Unasyn for empiric treatment of aspiration pneumonitis and possible cholangitis.  Transferred to stepdown on 9/25 for increasing respiratory distress from pulmonary edema.    Assessment/Plan  Acute on chronic respiratory failure,  Improving with diuresis.  Likely multifactorial from COPD, aspiration pneumonia, and acute diastolic  heart failure, now back to home oxygen level of 2.5 to 3L.   -  ECHO:  pending -  Increase to lasix 40mg  IV tid -  Wean oxygen to 2.5L -  Continue zosyn and bronchodilators -  Will try to avoid systemic steroids due to underlying infection -  Defer surgery again today while continuing diuresis, but anticipate he will be ready for OR in 1-2 days.  -  Tele:  NSR  Acute pancreatitis with CBD stone  --LFTs, lipase improving  --MRCP--distant, bowel duct filling defect suspicious for periampullary stone  - ERCP done on 08/16/14 shows Choledocholiathiasis, treated with biliary sphincterotomy and balloon sweeping. - CCS consulted:  Planning cholecystectomy when medically stable  Klebsiella bacteremia, likely seeded from cholangitis/obstructing stone, sensitive to unasyn but not ampicillin -  Change to zosyn day 3 -  Temperature and WBC trending down  Renal mass suspicious for RCC based on MRI appearance -Appreciate urology consult-->followup with Dr. Consuella Lose as outpatient   COPD with increased WOB which is more likely secondary to acute diastolic heart failure than COPD. -  Continue nebulized LABA and ICS today - Continue albuterol and Atrovent  -  Already on abx -  Will try to void systemic steroids but if not improving with diuresis, may need to start steroids  Hypertension, labile BPs -  Continue carvedilol and losartan  -  Continue bidil -  Continue prn hydralazine  Diabetes mellitus type 2, CBG well controlled - 08/12/2014 hemoglobin A1c 7.0 -  Cont to hold metformin -  Continue ISS  Hypokalemia due to diuresis -  Continue potassium supplementation with oral and IV potassium  Normocytic anemia due to chronic disease and acute illness -  Hemoglobin trending down -  Monitor for  bleeding -  tx for hgb < 7  Diet:  Diabetic diet Access:  PIV IVF:  off Proph:  SCDs   Code Status: full Family Communication: patient, his brother and sister Disposition Plan:  Continue  diuresis   Consultants:  General surgery, Dr. Johney Maine  GI, Dr. Carlean Purl  Urology, Dr. Junious Silk  Procedures:  9/20 CXR:  NAD  9/19 CXR:  Scarring, possibly mild pneumonia or atelectasis, mild vascular congestion  9/19 CT abd/pelvis:  Focal midline retroperitoneal inflammation, unclear if this is reflects sequelae duodenitis or possible pancreatitis. Suspected mild proximal small bowel wall thickening. Nonobstructing subcentimeter distal Common bile duct lesion, which could reflect polyp or less likely neoplasm, possible sludge ball. Recommend MRCP on a nonemergent basis. Small hiatal hernia with moderate amount of associated mildly inflamed fat, which is possibly tracking from the retroperitoneum. 16 mm solid LEFT lower pole renal mass highly concerning for renal cell carcinoma  9/21 MRCP:   Filling defect within the distal common duct, highly suspicious for periampullary stone. This is positioned more distally than on 08/12/14 CT. Mild secondary biliary ductal dilatation. 3. 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 4. Progressive moderate pancreatitis  ERCP 08-16-14  The CBD was dilated diffusely up to 55mm and there was a round filling defect in distal bile duct. An adequate biliary sphincterotomy was performed over the biliary wire and then a biliary retrieval balloon was used to sweep the bile ducts. A single 35mm greenish stone was delivered into the duodenum. There was no purulence. A completion, occlusion cholangiogram showed no further filling defects. The main pancreatic duct was never cannulated or injected with dye  Antibiotics: Levofloxacin 9/19>>9/20  unasyn 08/13/14>>> 9/25 Zosyn 9/25 >>    HPI/Subjective:  Feels like his breathing is back to baseline today.  Swelling continuing to improve. + voiding and having BMs.  Feels frustrated because he was told previously that he would need surgery before being discharged from  the hospital and yesterday, he was told he may need to return for outpatient cholecystectomy.  He and his family would like his surgery done prior to discharge.    Objective: Filed Vitals:   08/20/14 0853 08/20/14 0917 08/20/14 0920 08/20/14 1100  BP: 183/142  156/74 127/83  Pulse: 79  74 83  Temp:      TempSrc:      Resp:      Height:      Weight:      SpO2:  96%      Intake/Output Summary (Last 24 hours) at 08/20/14 1144 Last data filed at 08/20/14 1101  Gross per 24 hour  Intake   1290 ml  Output    850 ml  Net    440 ml   Filed Weights   08/18/14 0524 08/19/14 0500 08/20/14 0515  Weight: 84.3 kg (185 lb 13.6 oz) 82.8 kg (182 lb 8.7 oz) 81.602 kg (179 lb 14.4 oz)    Exam:   General:  WM, No acute distress, 3L [place]  HEENT:  NCAT, MMM  Cardiovascular:  RRR, nl S1, S2 no mrg, 2+ pulses, warm extremities  Respiratory:  Full expiratory wheeze, focal focal rales or rhonchi, no increased WOB  Abdomen:   NABS, soft, ND, NT  MSK:   Normal tone and bulk, 1+ bilateral LEE   Neuro:  Grossly intact  Data Reviewed: Basic Metabolic Panel:  Recent Labs Lab 08/16/14 0335 08/17/14 0507 08/18/14 0435 08/19/14 0500 08/20/14 7846  NA 136* 138 138 136* 136*  K 3.3* 3.9 3.7 3.0* 3.3*  CL 102 105 104 99 98  CO2 20 21 21 25 24   GLUCOSE 116* 129* 161* 132* 123*  BUN 11 13 13 13 12   CREATININE 0.72 0.75 0.74 0.82 0.90  CALCIUM 8.0* 8.1* 8.4 8.0* 8.1*  MG  --   --   --  1.9  --    Liver Function Tests:  Recent Labs Lab 08/16/14 0335 08/17/14 0507 08/18/14 0435 08/19/14 0500 08/20/14 0437  AST 15 20 23 19 16   ALT 19 18 21 17 15   ALKPHOS 110 105 130* 100 91  BILITOT 1.4* 1.3* 1.0 1.2 1.1  PROT 5.5* 5.7* 6.2 5.7* 5.8*  ALBUMIN 2.2* 2.2* 2.4* 2.2* 2.3*    Recent Labs Lab 08/15/14 0435 08/16/14 0335 08/17/14 0507 08/18/14 0435 08/19/14 0500  LIPASE 68* 60* 65* 91* 74*   No results found for this basename: AMMONIA,  in the last 168 hours CBC:  Recent  Labs Lab 08/16/14 0335 08/17/14 0507 08/18/14 0435 08/19/14 0500 08/20/14 0437  WBC 10.5 10.3 14.3* 9.1 10.5  HGB 10.8* 11.4* 12.3* 10.8* 10.6*  HCT 31.7* 32.6* 35.8* 30.3* 30.4*  MCV 92.4 90.8 93.0 90.7 91.3  PLT 126* 133* 187 195 244   Cardiac Enzymes: No results found for this basename: CKTOTAL, CKMB, CKMBINDEX, TROPONINI,  in the last 168 hours BNP (last 3 results)  Recent Labs  08/12/14 0408  PROBNP 772.2*   CBG:  Recent Labs Lab 08/19/14 0756 08/19/14 1157 08/19/14 1617 08/19/14 2135 08/20/14 0802  GLUCAP 138* 133* 115* 148* 147*    Recent Results (from the past 240 hour(s))  CULTURE, BLOOD (ROUTINE X 2)     Status: None   Collection Time    08/12/14  9:19 AM      Result Value Ref Range Status   Specimen Description BLOOD RIGHT ARM   Final   Special Requests     Final   Value: BOTTLES DRAWN AEROBIC AND ANAEROBIC 10CC BLUE BOTTLE, 7 CC RED BOTTLE   Culture  Setup Time     Final   Value: 08/12/2014 13:42     Performed at [facility]   Culture     Final   Value: NO GROWTH 5 DAYS     Performed at [facility]   Report Status 08/18/2014 FINAL   Final  CULTURE, BLOOD (ROUTINE X 2)     Status: None   Collection Time    08/12/14  9:24 AM      Result Value Ref Range Status   Specimen Description BLOOD RIGHT ARM   Final   Special Requests     Final   Value: BOTTLES DRAWN AEROBIC AND ANAEROBIC 10CC BOTH BOTTLES   Culture  Setup Time     Final   Value: 08/12/2014 13:42     Performed at [facility]   Culture     Final   Value: KLEBSIELLA PNEUMONIAE     Note: Gram Stain Report Called to,Read Back By and Verified With: Trinna Balloon ON 08/16/2014 AT 11:55P BY WILEJ     Performed at [facility]   Report Status 08/18/2014 FINAL   Final   Organism ID, Bacteria KLEBSIELLA PNEUMONIAE   Final  CULTURE, EXPECTORATED SPUTUM-ASSESSMENT     Status: None   Collection Time    08/13/14  1:56 AM      Result Value Ref Range Status    Specimen Description  SPUTUM   Final   Special Requests NONE   Final   Sputum evaluation     Final   Value: MICROSCOPIC FINDINGS SUGGEST THAT THIS SPECIMEN IS NOT REPRESENTATIVE OF LOWER RESPIRATORY SECRETIONS. PLEASE RECOLLECT.     Enid Derry AT 0216 ON 09.20.2015 BY NBROOKS   Report Status 08/13/2014 FINAL   Final  CULTURE, EXPECTORATED SPUTUM-ASSESSMENT     Status: None   Collection Time    08/13/14  6:47 AM      Result Value Ref Range Status   Specimen Description SPUTUM   Final   Special Requests NONE   Final   Sputum evaluation     Final   Value: THIS SPECIMEN IS ACCEPTABLE. RESPIRATORY CULTURE REPORT TO FOLLOW.   Report Status 08/13/2014 FINAL   Final  CULTURE, RESPIRATORY (NON-EXPECTORATED)     Status: None   Collection Time    08/13/14  6:47 AM      Result Value Ref Range Status   Specimen Description SPUTUM   Final   Special Requests NONE   Final   Gram Stain     Final   Value: FEW WBC PRESENT, PREDOMINANTLY MONONUCLEAR     RARE SQUAMOUS EPITHELIAL CELLS PRESENT     RARE GRAM POSITIVE COCCI     IN PAIRS IN CLUSTERS RARE GRAM NEGATIVE RODS     Performed at [facility]   Culture     Final   Value: NORMAL OROPHARYNGEAL FLORA     Performed at [facility]   Report Status 08/15/2014 FINAL   Final  URINE CULTURE     Status: None   Collection Time    08/13/14  1:58 PM      Result Value Ref Range Status   Specimen Description URINE, CATHETERIZED   Final   Special Requests NONE   Final   Culture  Setup Time     Final   Value: 08/13/2014 19:11     Performed at [place]     Final   Value: NO GROWTH     Performed at [facility]   Culture     Final   Value: NO GROWTH     Performed at [facility]   Report Status 08/14/2014 FINAL   Final  SURGICAL PCR SCREEN     Status: Abnormal   Collection Time    08/18/14  1:05 AM      Result Value Ref Range Status   MRSA, PCR INVALID RESULTS, SPECIMEN SENT FOR CULTURE (*)  NEGATIVE Final   Comment: RESULT CALLED TO, READ BACK BY AND VERIFIED WITH:     V.STEWART,RN AT 0303 ON 08/18/14 BY SHEAW   Staphylococcus aureus INVALID RESULTS, SPECIMEN SENT FOR CULTURE (*) NEGATIVE Final   Comment: RESULT CALLED TO, READ BACK BY AND VERIFIED WITH:     V.STEWART,RN AT 0303 ON 08/18/14 BY SHEAW                The Xpert SA Assay (FDA     approved for NASAL specimens     in patients over 15 years of age),     is one component of     a comprehensive surveillance     program.  Test performance has     been validated by [employer] for patients greater     than or equal to 75 year old.     It is not intended  to diagnose infection nor to     guide or monitor treatment.  MRSA CULTURE     Status: None   Collection Time    08/18/14  1:05 AM      Result Value Ref Range Status   Specimen Description NOSE   Final   Special Requests NONE   Final   Culture     Final   Value: NO SUSPICIOUS COLONIES, CONTINUING TO HOLD     Performed at [facility]   Report Status PENDING   Incomplete  CULTURE, BLOOD (ROUTINE X 2)     Status: None   Collection Time    08/18/14  8:31 AM      Result Value Ref Range Status   Specimen Description BLOOD LEFT HAND   Final   Special Requests BOTTLES DRAWN AEROBIC AND ANAEROBIC 10CC   Final   Culture  Setup Time     Final   Value: 08/18/2014 11:06     Performed at [facility]   Culture     Final   Value:        BLOOD CULTURE RECEIVED NO GROWTH TO DATE CULTURE WILL BE HELD FOR 5 DAYS BEFORE ISSUING A FINAL NEGATIVE REPORT     Performed at [facility]   Report Status PENDING   Incomplete  CULTURE, BLOOD (ROUTINE X 2)     Status: None   Collection Time    08/18/14  8:38 AM      Result Value Ref Range Status   Specimen Description BLOOD RIGHT ARM   Final   Special Requests BOTTLES DRAWN AEROBIC AND ANAEROBIC 10CC   Final   Culture  Setup Time     Final   Value: 08/18/2014 11:06     Performed at [facility]   Culture     Final   Value:        BLOOD CULTURE RECEIVED NO GROWTH TO DATE CULTURE WILL BE HELD FOR 5 DAYS BEFORE ISSUING A FINAL NEGATIVE REPORT     Performed at [facility]   Report Status PENDING   Incomplete     Studies: Dg Chest Port 1 View  08/19/2014   CLINICAL DATA:  Follow up pulmonary edema.  EXAM: PORTABLE CHEST - 1 VIEW  COMPARISON:  08/18/2014 and 12/23/2010  FINDINGS: There continues to be prominent interstitial lung markings suggesting interstitial edema. However, there is improving aeration at the lung bases. Stable linear densities in the right upper chest are compatible with an area of scarring. The heart size is normal. There is persistent consolidation in the retrocardiac space. Suspect left pleural fluid.  IMPRESSION: Slightly decreased interstitial edema.  Persistent consolidation at the left lung base and suspect left pleural fluid.   Electronically Signed   By: Markus Daft M.D.   On: 08/19/2014 06:54    Scheduled Meds: . antiseptic oral rinse  7 mL Mouth Rinse q12n4p  . arformoterol  15 mcg Nebulization BID  . aspirin EC  81 mg Oral Daily  . atorvastatin  20 mg Oral q1800  . budesonide (PULMICORT) nebulizer solution  0.5 mg Nebulization BID  . carvedilol  25 mg Oral BID WC  . chlorhexidine  15 mL Mouth Rinse BID  . furosemide  40 mg Intravenous TID PC  . guaiFENesin  600 mg Oral Daily  . insulin aspart  0-9 Units Subcutaneous TID WC  . ipratropium-albuterol  3 mL Nebulization QID  . isosorbide-hydrALAZINE  1 tablet Oral  TID  . lip balm  1 application Topical BID  . losartan  50 mg Oral Daily  . piperacillin-tazobactam (ZOSYN)  IV  3.375 g Intravenous 3 times per day  . saccharomyces boulardii  250 mg Oral BID  . sodium chloride  3 mL Intravenous Q12H   Continuous Infusions: . sodium chloride 20 mL/hr at 08/19/14 1500    Principal Problem:   Acute respiratory failure with hypoxia Active Problems:   HYPERLIPIDEMIA   HYPERTENSION   COPD  (chronic obstructive pulmonary disease)   Acute pancreatitis   CAP (community acquired pneumonia)   Leukocytosis   Abnormal LFTs   Renal mass   Type II or unspecified type diabetes mellitus without mention of complication, not stated as uncontrolled   Nonspecific (abnormal) findings on radiological and other examination of biliary tract   Acute-on-chronic respiratory failure   Aspiration pneumonitis   Choledocholithiasis   Acute on chronic diastolic heart failure    Time spent: 30 min    Millena Callins, [facility] Pager [phone]. If 7PM-7AM, please contact night-coverage at [email or web address], password [email or web address] 08/20/2014, 11:44 AM  LOS: 8 days

## 2014-08-20 NOTE — Consult Note (Signed)
Consulting cardiologist: Dr. Satira Sark  Clinical Summary Mr. Berenguer is an 80 y.o.male with past medical history outlined below, currently admitted with recent abdominal pain, diagnosed with possible pancreatitis versus duodenitis, left lower pole renal mass concerning for malignancy, and possible cholangitis based on subsequent GI evaluation. He has also had aspiration pneumonitis, and some volume overload that has required intravenous diuresis. Respiratory status has improved, he is now in baseline oxygen supplementation at 2-3 L which had been on home. X-ray reports decreased interstitial edema and consolidation of the left lung base with associated pleural fluid.  He reports no history of CAD or congestive heart failure. He was evaluated by Dr. Caryl Comes back in 2010 after a probable neurocardiogenic syncopal event, LVEF was normal at that time. He reports no subsequent events. Most recent ECG shows normal sinus rhythm, no acute ST segment changes.  Patient states that he functions at home, completes all of his ADLs. Reports NYHA class II dyspnea, is able to walk up a flight of steps without stopping. No exertional chest pain.  Followup echocardiogram is pending.   Allergies  Allergen Reactions  . Amlodipine Besylate     REACTION: swelling  . Cardura [Doxazosin Mesylate] Itching  . Clonidine Derivatives Itching  . Fluticasone-Salmeterol Hives and Itching    Medications Scheduled Medications: . antiseptic oral rinse  7 mL Mouth Rinse q12n4p  . arformoterol  15 mcg Nebulization BID  . aspirin EC  81 mg Oral Daily  . atorvastatin  20 mg Oral q1800  . budesonide (PULMICORT) nebulizer solution  0.5 mg Nebulization BID  . carvedilol  25 mg Oral BID WC  . chlorhexidine  15 mL Mouth Rinse BID  . furosemide  40 mg Intravenous TID PC  . guaiFENesin  600 mg Oral Daily  . insulin aspart  0-9 Units Subcutaneous TID WC  . ipratropium-albuterol  3 mL Nebulization QID  .  isosorbide-hydrALAZINE  1 tablet Oral TID  . lip balm  1 application Topical BID  . losartan  50 mg Oral Daily  . piperacillin-tazobactam (ZOSYN)  IV  3.375 g Intravenous 3 times per day  . saccharomyces boulardii  250 mg Oral BID  . sodium chloride  3 mL Intravenous Q12H    Infusions: . sodium chloride 20 mL/hr at 08/19/14 1500    PRN Medications: acetaminophen, acetaminophen, albuterol, alum & mag hydroxide-simeth, bisacodyl, hydrALAZINE, magic mouthwash, menthol-cetylpyridinium, morphine injection, ondansetron (ZOFRAN) IV, phenol, polyethylene glycol, promethazine   Past Medical History  Diagnosis Date  . Type 2 diabetes mellitus 10/2009  . COPD (chronic obstructive pulmonary disease)   . Syncope 2010    Evaluated by Dr. Caryl Comes - normal LVEF, possibly neurally mediated  . Hyperlipidemia   . Essential hypertension, benign   . Prostate cancer 2008    s/p XRT  . ED (erectile dysfunction)   . Cholestatic hepatitis 2014  . Personal history of colonic polyps     Past Surgical History  Procedure Laterality Date  . Cataract extraction Bilateral   . Eye surgery Left 07/17/2014  . Colonoscopy    . Ercp N/A 08/16/2014    Procedure: ENDOSCOPIC RETROGRADE CHOLANGIOPANCREATOGRAPHY (ERCP);  Surgeon: Milus Banister, MD;  Location: [facility];  Service: Endoscopy;  Laterality: N/A;    Family History  Problem Relation Age of Onset  . Cirrhosis Brother   . Dementia Mother   . Colon cancer Neg Hx   . Prostate cancer Neg Hx   . CAD Neg Hx  Social History Mr. Hiney reports that he quit smoking about 8 years ago. His smoking use included Cigarettes. He has a 80 pack-year smoking history. He has never used smokeless tobacco. Mr. Bentson reports that he drinks alcohol.  Review of Systems Other systems reviewed and negative except as outlined above.  Physical Examination Blood pressure 94/58, pulse 80, temperature 98.4 F (36.9 C), temperature source Oral, resp. rate 18, height 5'  10" (1.778 m), weight 179 lb 14.4 oz (81.602 kg), SpO2 97.00%.  Intake/Output Summary (Last 24 hours) at 08/20/14 1511 Last data filed at 08/20/14 1431  Gross per 24 hour  Intake   1290 ml  Output    850 ml  Net    440 ml   The patient appears comfortable at rest. HEENT: Conjunctiva and lids normal, oropharynx clear. Neck: Supple, no elevated JVP or carotid bruits, no thyromegaly. Lungs: Decreased breath sounds at the left base, nonlabored breathing at rest. Cardiac: Regular rate and rhythm, S4 with soft systolic murmur, no pericardial rub. Abdomen: Soft, nontender, bowel sounds present, no guarding or rebound. Extremities: No pitting edema, distal pulses 2+. Skin: Warm and dry. Musculoskeletal: No kyphosis. Neuropsychiatric: Alert and oriented x3, affect grossly appropriate.   Lab Results  Basic Metabolic Panel:  Recent Labs Lab 08/16/14 0335 08/17/14 0507 08/18/14 0435 08/19/14 0500 08/20/14 0437  NA 136* 138 138 136* 136*  K 3.3* 3.9 3.7 3.0* 3.3*  CL 102 105 104 99 98  CO2 20 21 21 25 24   GLUCOSE 116* 129* 161* 132* 123*  BUN 11 13 13 13 12   CREATININE 0.72 0.75 0.74 0.82 0.90  CALCIUM 8.0* 8.1* 8.4 8.0* 8.1*  MG  --   --   --  1.9  --     Liver Function Tests:  Recent Labs Lab 08/16/14 0335 08/17/14 0507 08/18/14 0435 08/19/14 0500 08/20/14 0437  AST 15 20 23 19 16   ALT 19 18 21 17 15   ALKPHOS 110 105 130* 100 91  BILITOT 1.4* 1.3* 1.0 1.2 1.1  PROT 5.5* 5.7* 6.2 5.7* 5.8*  ALBUMIN 2.2* 2.2* 2.4* 2.2* 2.3*    CBC:  Recent Labs Lab 08/16/14 0335 08/17/14 0507 08/18/14 0435 08/19/14 0500 08/20/14 0437  WBC 10.5 10.3 14.3* 9.1 10.5  HGB 10.8* 11.4* 12.3* 10.8* 10.6*  HCT 31.7* 32.6* 35.8* 30.3* 30.4*  MCV 92.4 90.8 93.0 90.7 91.3  PLT 126* 133* 187 195 244    Cardiac Enzymes: Troponin I negative  Pro-BNP: 772   Impression  1. Preoperative evaluation in an 80 year old male with history of insulin requiring type 2 diabetes mellitus,  normal creatinine, no definitive history of CAD or systolic dysfunction based on available information. He has had some volume overload during hospital stay with intra-abdominal process, doing better now after IV diuresis. Followup echocardiogram is pending. He is being considered for cholecystectomy ultimately.  2. Previous history of probable neurocardiogenic syncope in 2010, no recurrences. LVEF is normal at that time.  3. Essential hypertension.  4. History of hyperlipidemia, recent LDL 53.   Recommendations  Based on Revised Cardiac Risk Index for preoperative assessment, patient's overall risk is class II predicting a 6.6% perioperative risk of major adverse cardiac events. He is on a good medical regimen at this time including beta blocker, ARB, statin, and hydralazine, also for the time on IV Lasix. Followup echocardiogram is pending for reassessment of LV systolic and diastolic function. At this point, anticipate no further cardiac testing. Ischemic testing would be reconsidered if he  has evidence of significant LV dysfunction. Our service will follow with you.  Satira Sark, M.D., F.A.C.C.

## 2014-08-20 NOTE — Progress Notes (Addendum)
4 Days Post-Op  Subjective: Denies n/v/abd pain. Denies cp/sob. Had high BP early with HA  Objective: Vital signs in last 24 hours: Temp:  [97.6 F (36.4 C)-99 F (37.2 C)] 98.7 F (37.1 C) (09/27 0515) Pulse Rate:  [71-82] 74 (09/27 0920) Resp:  [15-18] 16 (09/27 0515) BP: (118-183)/(63-142) 156/74 mmHg (09/27 0920) SpO2:  [91 %-99 %] 96 % (09/27 0917) Weight:  [179 lb 14.4 oz (81.602 kg)] 179 lb 14.4 oz (81.602 kg) (09/27 0515) Last BM Date: 08/20/14  Intake/Output from previous day: 09/26 0701 - 09/27 0700 In: 980 [P.O.:480; I.V.:300; IV Piggyback:200] Out: 1500 [Urine:1500] Intake/Output this shift: Total I/O In: 120 [P.O.:120] Out: -   Alert, nad cta b/l Reg Soft, a little firm but nontender, nd  Lab Results:   Recent Labs  08/19/14 0500 08/20/14 0437  WBC 9.1 10.5  HGB 10.8* 10.6*  HCT 30.3* 30.4*  PLT 195 244   BMET  Recent Labs  08/19/14 0500 08/20/14 0437  NA 136* 136*  K 3.0* 3.3*  CL 99 98  CO2 25 24  GLUCOSE 132* 123*  BUN 13 12  CREATININE 0.82 0.90  CALCIUM 8.0* 8.1*   PT/INR No results found for this basename: LABPROT, INR,  in the last 72 hours ABG No results found for this basename: PHART, PCO2, PO2, HCO3,  in the last 72 hours  Studies/Results: Dg Chest Port 1 View  08/19/2014   CLINICAL DATA:  Follow up pulmonary edema.  EXAM: PORTABLE CHEST - 1 VIEW  COMPARISON:  08/18/2014 and 12/23/2010  FINDINGS: There continues to be prominent interstitial lung markings suggesting interstitial edema. However, there is improving aeration at the lung bases. Stable linear densities in the right upper chest are compatible with an area of scarring. The heart size is normal. There is persistent consolidation in the retrocardiac space. Suspect left pleural fluid.  IMPRESSION: Slightly decreased interstitial edema.  Persistent consolidation at the left lung base and suspect left pleural fluid.   Electronically Signed   By: Markus Daft M.D.   On:  08/19/2014 06:54    Anti-infectives: Anti-infectives   Start     Dose/Rate Route Frequency Ordered Stop   08/18/14 0930  piperacillin-tazobactam (ZOSYN) IVPB 3.375 g     3.375 g 12.5 mL/hr over 240 Minutes Intravenous 3 times per day 08/18/14 0915     08/18/14 0600  ampicillin-sulbactam (UNASYN) 1.5 g in sodium chloride 0.9 % 50 mL IVPB  Status:  Discontinued     1.5 g 100 mL/hr over 30 Minutes Intravenous On call to O.R. 08/17/14 1841 08/18/14 0800   08/13/14 1200  ampicillin-sulbactam (UNASYN) 1.5 g in sodium chloride 0.9 % 50 mL IVPB  Status:  Discontinued     1.5 g 100 mL/hr over 30 Minutes Intravenous 4 times per day 08/13/14 1104 08/18/14 0800   08/12/14 0800  levofloxacin (LEVAQUIN) IVPB 750 mg  Status:  Discontinued     750 mg 100 mL/hr over 90 Minutes Intravenous Every 24 hours 08/12/14 0746 08/13/14 1104      Assessment/Plan: s/p Procedure(s): ENDOSCOPIC RETROGRADE CHOLANGIOPANCREATOGRAPHY (ERCP) (N/A) Principal Problem:   Acute respiratory failure with hypoxia Active Problems:   HYPERLIPIDEMIA   HYPERTENSION   COPD (chronic obstructive pulmonary disease)   Acute pancreatitis   CAP (community acquired pneumonia)   Leukocytosis   Abnormal LFTs   Renal mass   Type II or unspecified type diabetes mellitus without mention of complication, not stated as uncontrolled   Nonspecific (abnormal) findings on radiological  and other examination of biliary tract   Acute-on-chronic respiratory failure   Aspiration pneumonitis   Choledocholithiasis   Acute on chronic diastolic heart failure Klebsiella bacteremia   Will defer length of treatment for klebsiella bacteremia to medicine. Repeat bld cx from 9/25 still pending.  Doesn't need abx for gallbladder rec cardiology consult for evaluation for surgery rec repeat cxr in am Pt has had palliative procedure for his choledocholithiasis and doesn't necessarily need his GB out this admission if his heart/lungs aren't in prime  shape.   ADDENDUM: called in cards consult -spoke with Dr Caryl Comes. Updated family at [place]. [name], MD, FACS General, Bariatric, & Minimally Invasive Surgery [facility], [place]    LOS: 8 days    Gayland Curry 08/20/2014

## 2014-08-21 ENCOUNTER — Inpatient Hospital Stay (HOSPITAL_COMMUNITY): Payer: Medicare Other

## 2014-08-21 DIAGNOSIS — I369 Nonrheumatic tricuspid valve disorder, unspecified: Secondary | ICD-10-CM

## 2014-08-21 DIAGNOSIS — K8050 Calculus of bile duct without cholangitis or cholecystitis without obstruction: Secondary | ICD-10-CM

## 2014-08-21 LAB — CBC
HEMATOCRIT: 29.8 % — AB (ref 39.0–52.0)
Hemoglobin: 10.2 g/dL — ABNORMAL LOW (ref 13.0–17.0)
MCH: 31.9 pg (ref 26.0–34.0)
MCHC: 34.2 g/dL (ref 30.0–36.0)
MCV: 93.1 fL (ref 78.0–100.0)
PLATELETS: 237 10*3/uL (ref 150–400)
RBC: 3.2 MIL/uL — AB (ref 4.22–5.81)
RDW: 13.1 % (ref 11.5–15.5)
WBC: 12.1 10*3/uL — AB (ref 4.0–10.5)

## 2014-08-21 LAB — GLUCOSE, CAPILLARY
GLUCOSE-CAPILLARY: 186 mg/dL — AB (ref 70–99)
Glucose-Capillary: 129 mg/dL — ABNORMAL HIGH (ref 70–99)
Glucose-Capillary: 145 mg/dL — ABNORMAL HIGH (ref 70–99)
Glucose-Capillary: 126 mg/dL — ABNORMAL HIGH (ref 70–99)

## 2014-08-21 LAB — COMPREHENSIVE METABOLIC PANEL
ALBUMIN: 2.4 g/dL — AB (ref 3.5–5.2)
ALK PHOS: 87 U/L (ref 39–117)
ALT: 14 U/L (ref 0–53)
AST: 15 U/L (ref 0–37)
Anion gap: 14 (ref 5–15)
BILIRUBIN TOTAL: 1 mg/dL (ref 0.3–1.2)
BUN: 12 mg/dL (ref 6–23)
CHLORIDE: 98 meq/L (ref 96–112)
CO2: 24 meq/L (ref 19–32)
Calcium: 8.1 mg/dL — ABNORMAL LOW (ref 8.4–10.5)
Creatinine, Ser: 0.94 mg/dL (ref 0.50–1.35)
GFR calc Af Amer: 89 mL/min — ABNORMAL LOW (ref >90)
GFR, EST NON AFRICAN AMERICAN: 77 mL/min — AB (ref >90)
Glucose, Bld: 126 mg/dL — ABNORMAL HIGH (ref 70–99)
Potassium: 3.5 mEq/L — ABNORMAL LOW (ref 3.7–5.3)
Sodium: 136 mEq/L — ABNORMAL LOW (ref 137–147)
Total Protein: 6.2 g/dL (ref 6.0–8.3)

## 2014-08-21 MED ORDER — POTASSIUM CHLORIDE CRYS ER 20 MEQ PO TBCR
40.0000 meq | EXTENDED_RELEASE_TABLET | Freq: Every day | ORAL | Status: DC
  Administered 2014-08-21 – 2014-08-23 (×3): 40 meq via ORAL
  Filled 2014-08-21 (×3): qty 2

## 2014-08-21 MED ORDER — IPRATROPIUM-ALBUTEROL 0.5-2.5 (3) MG/3ML IN SOLN
3.0000 mL | Freq: Three times a day (TID) | RESPIRATORY_TRACT | Status: DC
  Administered 2014-08-21 – 2014-08-23 (×5): 3 mL via RESPIRATORY_TRACT
  Filled 2014-08-21 (×5): qty 3

## 2014-08-21 NOTE — Progress Notes (Signed)
Pt seen and examined  Alert, nad Soft, nt, nd  cxr and echo reviewed. Per card note, if LV fxn ok on echo, ok for surgery  Plan Lap chole with IOC tomorrow PM  I believe the patient's symptoms are consistent with gallbladder disease.  I discussed laparoscopic cholecystectomy with IOC in detail.  The patient was shown diagrams detailing the procedure.  We discussed the risks and benefits of a laparoscopic cholecystectomy including, but not limited to bleeding, infection, injury to surrounding structures such as the intestine or liver, bile leak, retained gallstones, need to convert to an open procedure, prolonged diarrhea, blood clots such as  DVT, common bile duct injury, anesthesia risks, and possible need for additional procedures.  We discussed the typical post-operative recovery course. I explained that the likelihood of improvement of their symptoms is good. We did discuss possibility of pulmonary issues after surgery.   Jim [name]. [name], MD, FACS General, Bariatric, & Minimally Invasive Surgery [facility], [place]

## 2014-08-21 NOTE — Progress Notes (Signed)
[facility] PROGRESS NOTE  Jim [name] ASN:[id number] DOB: 02/20/1932 DOA: 08/12/2014 PCP: [name], MD  Brief Summary  Interim summary  80 year old male with past medical history of hypertension, dyslipidemia, diabetes, chronic diastolic CHF, history of prostate cancer who presented to [facility] ED 08/12/2014 with worsening abdominal pain over past 24 hours prior to this admission. Patient explains pain is in the upper and mid abdomen associated with multiple episodes of nonbloody vomiting. In emergency department, the patient was noted to have a lipase >3000, AST 65, ALT 90, ALT 187, total bilirubin 6.5. BNP was 772, WBC 15.6. CT abdomen showed focal midline retroperitoneal inflammation unclear if this is sequela of duodenitis or possible pancreatitis, possible mid to proximal small bowel wall thickening, non-obstructing sub-centimeters distal common bile duct lesion which could reflect polyp or less likely neoplasm, 16 mm solid left lower pole renal mass highly concerning for renal cell carcinoma.  The patient was placed on bowel rest and started on intravenous fluids for his acute pancreatitis. Gastroenterology was consulted. MRCP was performed as well as MRI of the abdomen to clarify his left renal mass. The MRCP revealed a distal common bile duct filling defect concerning for a stone. MRI of the abdomen revealed a suspicious left renal lesion concerning for renal cell carcinoma. Urology has been consulted, and felt due to slow growing nature of these masses, pt can follow up as an outpatient. Gastroenterology plans for ERCP on 08/16/2014. The patient continues on Unasyn for empiric treatment of aspiration pneumonitis and possible cholangitis.  Transferred to stepdown on 9/25 for increasing respiratory distress from pulmonary edema.    Assessment/Plan  Acute on chronic respiratory failure,  Improving with diuresis.  Likely multifactorial from COPD, aspiration pneumonia, and acute diastolic  heart failure, now back to home oxygen level of 2.5 to 3L.   -  ECHO:  Mild LVH, EF 50-55%, no regional wall motion abnl, grade 1 DD,  -  Continue lasix 40mg  IV tid -  Wean oxygen to 2.5L -  Continue zosyn and bronchodilators -  Will try to avoid systemic steroids due to underlying infection -  I/O not strictly recorded, but weight is down almost 3kg over last few days -  Stable for OR tomorrow   Acute pancreatitis with CBD stone  --LFTs, lipase improving  --MRCP--distant, bowel duct filling defect suspicious for periampullary stone  - ERCP done on 08/16/14 shows Choledocholiathiasis, treated with biliary sphincterotomy and balloon sweeping. - CCS consulted:  Planning cholecystectomy when medically stable  Klebsiella bacteremia, likely seeded from cholangitis/obstructing stone, sensitive to unasyn but not ampicillin -  Change to zosyn day 4  Renal mass suspicious for RCC based on MRI appearance -Appreciate urology consult-->followup with Dr. Consuella Lose as outpatient   COPD, stable -  Continue nebulized LABA and ICS today - Continue albuterol and Atrovent  -  Already on abx  Hypertension, labile BPs -  Continue carvedilol and losartan  -  Continue bidil -  Continue prn hydralazine  Diabetes mellitus type 2, CBG well controlled - 08/12/2014 hemoglobin A1c 7.0 -  Cont to hold metformin -  Continue ISS  Hypokalemia due to diuresis -  Start daily potassium repletion  Normocytic anemia due to chronic disease and acute illness -  Hemoglobin approximately stable near 10 mg/dl  Diet:  Diabetic diet Access:  PIV IVF:  off Proph:  SCDs   Code Status: full Family Communication: patient alone Disposition Plan:  Continuing diuresis today diuresis   Consultants:  General surgery, Dr.  Gross  GI, Dr. Carlean Purl  Urology, Dr. Junious Silk  Procedures:  9/20 CXR:  NAD  9/19 CXR:  Scarring, possibly mild pneumonia or atelectasis, mild vascular congestion  9/19 CT abd/pelvis:  Focal  midline retroperitoneal inflammation, unclear if this is reflects sequelae duodenitis or possible pancreatitis. Suspected mild proximal small bowel wall thickening. Nonobstructing subcentimeter distal Common bile duct lesion, which could reflect polyp or less likely neoplasm, possible sludge ball. Recommend MRCP on a nonemergent basis. Small hiatal hernia with moderate amount of associated mildly inflamed fat, which is possibly tracking from the retroperitoneum. 16 mm solid LEFT lower pole renal mass highly concerning for renal cell carcinoma  9/21 MRCP:   Filling defect within the distal common duct, highly suspicious for periampullary stone. This is positioned more distally than on 08/12/14 CT. Mild secondary biliary ductal dilatation. 3. 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 4. Progressive moderate pancreatitis  ERCP 08-16-14  The CBD was dilated diffusely up to 52mm and there was a round filling defect in distal bile duct. An adequate biliary sphincterotomy was performed over the biliary wire and then a biliary retrieval balloon was used to sweep the bile ducts. A single 64mm greenish stone was delivered into the duodenum. There was no purulence. A completion, occlusion cholangiogram showed no further filling defects. The main pancreatic duct was never cannulated or injected with dye  Antibiotics: Levofloxacin 9/19>>9/20  unasyn 08/13/14>>> 9/25 Zosyn 9/25 >>    HPI/Subjective:  Was able to ambulate in halls without dyspnea on his normal 2.5-3L of oxygen.  Cough in the AM, but otherwise not SOB.  Denies nausea, vomiting, diarrhea, constipation, abdominal pain  Objective: Filed Vitals:   08/21/14 0829 08/21/14 0919 08/21/14 1017 08/21/14 1328  BP:  118/54 119/53 130/69  Pulse: 75 72 80 75  Temp:      TempSrc:      Resp: 19  20   Height:      Weight:      SpO2: 95% 96% 99% 99%    Intake/Output Summary (Last 24 hours) at  08/21/14 1354 Last data filed at 08/21/14 1348  Gross per 24 hour  Intake   1636 ml  Output   1725 ml  Net    -89 ml   Filed Weights   08/19/14 0500 08/20/14 0515 08/21/14 0457  Weight: 82.8 kg (182 lb 8.7 oz) 81.602 kg (179 lb 14.4 oz) 80.015 kg (176 lb 6.4 oz)    Exam:   General:  WM, No acute distress, 3L [place]  HEENT:  NCAT, MMM  Cardiovascular:  RRR, nl S1, S2 no mrg, 2+ pulses, warm extremities  Respiratory:   CTAB, no increased WOB  Abdomen:   NABS, soft, ND, NT  MSK:   Normal tone and bulk, trace bilateral LEE   Neuro:  Grossly intact  Data Reviewed: Basic Metabolic Panel:  Recent Labs Lab 08/17/14 0507 08/18/14 0435 08/19/14 0500 08/20/14 0437 08/21/14 0434  NA 138 138 136* 136* 136*  K 3.9 3.7 3.0* 3.3* 3.5*  CL 105 104 99 98 98  CO2 21 21 25 24 24   GLUCOSE 129* 161* 132* 123* 126*  BUN 13 13 13 12 12   CREATININE 0.75 0.74 0.82 0.90 0.94  CALCIUM 8.1* 8.4 8.0* 8.1* 8.1*  MG  --   --  1.9  --   --    Liver Function Tests:  Recent Labs Lab 08/17/14 0507 08/18/14 0435 08/19/14 0500 08/20/14 0437 08/21/14  0434  AST 20 23 19 16 15   ALT 18 21 17 15 14   ALKPHOS 105 130* 100 91 87  BILITOT 1.3* 1.0 1.2 1.1 1.0  PROT 5.7* 6.2 5.7* 5.8* 6.2  ALBUMIN 2.2* 2.4* 2.2* 2.3* 2.4*    Recent Labs Lab 08/15/14 0435 08/16/14 0335 08/17/14 0507 08/18/14 0435 08/19/14 0500  LIPASE 68* 60* 65* 91* 74*   No results found for this basename: AMMONIA,  in the last 168 hours CBC:  Recent Labs Lab 08/17/14 0507 08/18/14 0435 08/19/14 0500 08/20/14 0437 08/21/14 0434  WBC 10.3 14.3* 9.1 10.5 12.1*  HGB 11.4* 12.3* 10.8* 10.6* 10.2*  HCT 32.6* 35.8* 30.3* 30.4* 29.8*  MCV 90.8 93.0 90.7 91.3 93.1  PLT 133* 187 195 244 237   Cardiac Enzymes: No results found for this basename: CKTOTAL, CKMB, CKMBINDEX, TROPONINI,  in the last 168 hours BNP (last 3 results)  Recent Labs  08/12/14 0408  PROBNP 772.2*   CBG:  Recent Labs Lab 08/20/14 1233  08/20/14 1721 08/20/14 2104 08/21/14 0708 08/21/14 1217  GLUCAP 191* 137* 155* 129* 145*    Recent Results (from the past 240 hour(s))  CULTURE, BLOOD (ROUTINE X 2)     Status: None   Collection Time    08/12/14  9:19 AM      Result Value Ref Range Status   Specimen Description BLOOD RIGHT ARM   Final   Special Requests     Final   Value: BOTTLES DRAWN AEROBIC AND ANAEROBIC 10CC BLUE BOTTLE, 7 CC RED BOTTLE   Culture  Setup Time     Final   Value: 08/12/2014 13:42     Performed at [facility]   Culture     Final   Value: NO GROWTH 5 DAYS     Performed at [facility]   Report Status 08/18/2014 FINAL   Final  CULTURE, BLOOD (ROUTINE X 2)     Status: None   Collection Time    08/12/14  9:24 AM      Result Value Ref Range Status   Specimen Description BLOOD RIGHT ARM   Final   Special Requests     Final   Value: BOTTLES DRAWN AEROBIC AND ANAEROBIC 10CC BOTH BOTTLES   Culture  Setup Time     Final   Value: 08/12/2014 13:42     Performed at [facility]   Culture     Final   Value: KLEBSIELLA PNEUMONIAE     Note: Gram Stain Report Called to,Read Back By and Verified With: Trinna Balloon ON 08/16/2014 AT 11:55P BY WILEJ     Performed at [facility]   Report Status 08/18/2014 FINAL   Final   Organism ID, Bacteria KLEBSIELLA PNEUMONIAE   Final  CULTURE, EXPECTORATED SPUTUM-ASSESSMENT     Status: None   Collection Time    08/13/14  1:56 AM      Result Value Ref Range Status   Specimen Description SPUTUM   Final   Special Requests NONE   Final   Sputum evaluation     Final   Value: MICROSCOPIC FINDINGS SUGGEST THAT THIS SPECIMEN IS NOT REPRESENTATIVE OF LOWER RESPIRATORY SECRETIONS. PLEASE RECOLLECT.     Enid Derry AT 0216 ON 09.20.2015 BY NBROOKS   Report Status 08/13/2014 FINAL   Final  CULTURE, EXPECTORATED SPUTUM-ASSESSMENT     Status: None   Collection Time    08/13/14  6:47 AM      Result Value Ref  Range Status   Specimen Description  SPUTUM   Final   Special Requests NONE   Final   Sputum evaluation     Final   Value: THIS SPECIMEN IS ACCEPTABLE. RESPIRATORY CULTURE REPORT TO FOLLOW.   Report Status 08/13/2014 FINAL   Final  CULTURE, RESPIRATORY (NON-EXPECTORATED)     Status: None   Collection Time    08/13/14  6:47 AM      Result Value Ref Range Status   Specimen Description SPUTUM   Final   Special Requests NONE   Final   Gram Stain     Final   Value: FEW WBC PRESENT, PREDOMINANTLY MONONUCLEAR     RARE SQUAMOUS EPITHELIAL CELLS PRESENT     RARE GRAM POSITIVE COCCI     IN PAIRS IN CLUSTERS RARE GRAM NEGATIVE RODS     Performed at [facility]   Culture     Final   Value: NORMAL OROPHARYNGEAL FLORA     Performed at [facility]   Report Status 08/15/2014 FINAL   Final  URINE CULTURE     Status: None   Collection Time    08/13/14  1:58 PM      Result Value Ref Range Status   Specimen Description URINE, CATHETERIZED   Final   Special Requests NONE   Final   Culture  Setup Time     Final   Value: 08/13/2014 19:11     Performed at [place]     Final   Value: NO GROWTH     Performed at [facility]   Culture     Final   Value: NO GROWTH     Performed at [facility]   Report Status 08/14/2014 FINAL   Final  SURGICAL PCR SCREEN     Status: Abnormal   Collection Time    08/18/14  1:05 AM      Result Value Ref Range Status   MRSA, PCR INVALID RESULTS, SPECIMEN SENT FOR CULTURE (*) NEGATIVE Final   Comment: RESULT CALLED TO, READ BACK BY AND VERIFIED WITH:     V.STEWART,RN AT 0303 ON 08/18/14 BY SHEAW   Staphylococcus aureus INVALID RESULTS, SPECIMEN SENT FOR CULTURE (*) NEGATIVE Final   Comment: RESULT CALLED TO, READ BACK BY AND VERIFIED WITH:     V.STEWART,RN AT 0303 ON 08/18/14 BY SHEAW                The Xpert SA Assay (FDA     approved for NASAL specimens     in patients over 85 years of age),     is one component of     a comprehensive  surveillance     program.  Test performance has     been validated by [employer] for patients greater     than or equal to 24 year old.     It is not intended     to diagnose infection nor to     guide or monitor treatment.  MRSA CULTURE     Status: None   Collection Time    08/18/14  1:05 AM      Result Value Ref Range Status   Specimen Description NOSE   Final   Special Requests NONE   Final   Culture     Final   Value: NO STAPHYLOCOCCUS AUREUS ISOLATED     Note: No MRSA Isolated  Performed at [facility]   Report Status 08/20/2014 FINAL   Final  CULTURE, BLOOD (ROUTINE X 2)     Status: None   Collection Time    08/18/14  8:31 AM      Result Value Ref Range Status   Specimen Description BLOOD LEFT HAND   Final   Special Requests BOTTLES DRAWN AEROBIC AND ANAEROBIC 10CC   Final   Culture  Setup Time     Final   Value: 08/18/2014 11:06     Performed at [facility]   Culture     Final   Value:        BLOOD CULTURE RECEIVED NO GROWTH TO DATE CULTURE WILL BE HELD FOR 5 DAYS BEFORE ISSUING A FINAL NEGATIVE REPORT     Performed at [facility]   Report Status PENDING   Incomplete  CULTURE, BLOOD (ROUTINE X 2)     Status: None   Collection Time    08/18/14  8:38 AM      Result Value Ref Range Status   Specimen Description BLOOD RIGHT ARM   Final   Special Requests BOTTLES DRAWN AEROBIC AND ANAEROBIC 10CC   Final   Culture  Setup Time     Final   Value: 08/18/2014 11:06     Performed at [facility]   Culture     Final   Value:        BLOOD CULTURE RECEIVED NO GROWTH TO DATE CULTURE WILL BE HELD FOR 5 DAYS BEFORE ISSUING A FINAL NEGATIVE REPORT     Performed at [facility]   Report Status PENDING   Incomplete     Studies: Dg Chest 2 View  08/21/2014   CLINICAL DATA:  Cough and shortness of breath.  EXAM: CHEST  2 VIEW  COMPARISON:  08/19/2012.  FINDINGS: Improved interstitial edema. Persistent consolidation at the LEFT base  with LEFT effusion. Stable linear scarring RIGHT upper lobe. Stable cardiomediastinal silhouette with calcified tortuous aorta.  IMPRESSION: Overall slight improvement aeration.   Electronically Signed   By: Rolla Flatten M.D.   On: 08/21/2014 08:18    Scheduled Meds: . antiseptic oral rinse  7 mL Mouth Rinse q12n4p  . arformoterol  15 mcg Nebulization BID  . aspirin EC  81 mg Oral Daily  . atorvastatin  20 mg Oral q1800  . budesonide (PULMICORT) nebulizer solution  0.5 mg Nebulization BID  . carvedilol  25 mg Oral BID WC  . chlorhexidine  15 mL Mouth Rinse BID  . furosemide  40 mg Intravenous TID PC  . guaiFENesin  600 mg Oral Daily  . insulin aspart  0-9 Units Subcutaneous TID WC  . ipratropium-albuterol  3 mL Nebulization TID  . isosorbide-hydrALAZINE  1 tablet Oral TID  . lip balm  1 application Topical BID  . losartan  50 mg Oral Daily  . piperacillin-tazobactam (ZOSYN)  IV  3.375 g Intravenous 3 times per day  . saccharomyces boulardii  250 mg Oral BID  . sodium chloride  3 mL Intravenous Q12H   Continuous Infusions: . sodium chloride 20 mL/hr at 08/19/14 1500    Principal Problem:   Acute respiratory failure with hypoxia Active Problems:   HYPERLIPIDEMIA   HYPERTENSION   COPD (chronic obstructive pulmonary disease)   Acute pancreatitis   CAP (community acquired pneumonia)   Leukocytosis   Abnormal LFTs   Renal mass   Type II or unspecified type diabetes mellitus without mention of  complication, not stated as uncontrolled   Nonspecific (abnormal) findings on radiological and other examination of biliary tract   Acute-on-chronic respiratory failure   Aspiration pneumonitis   Choledocholithiasis   Acute on chronic diastolic heart failure   Preoperative clearance    Time spent: 30 min    Nelson Julson, [facility] Pager [phone]. If 7PM-7AM, please contact night-coverage at [email or web address], password [facility] 08/21/2014, 1:54 PM  LOS: 9 days

## 2014-08-21 NOTE — Progress Notes (Signed)
NUTRITION FOLLOW UP  Intervention:   - Encouraged continued excellent meal intake - RD to continue to monitor   Nutrition Dx:   Inadequate oral intake related to inability to eat as evidenced by NPO and clear liquid diet - resolved, pt's diet advanced to diabetic diet and pt eating 100% of meals   New nutrition dx:  Increased nutrient needs related acute on chronic respiratory failure and acute pancreatitis as evidenced by MD notes.   Goal:   Pt to meet >/= 90% of their estimated nutrition needs - met  Monitor:   Weights, labs, intake   Assessment:   80 year old male with past medical history of hypertension, dyslipidemia, diabetes, chronic diastolic CHF, history of prostate cancer who presented to [facility] ED 08/12/2014 with worsening abdominal pain over past 24 hours prior to this admission.   9/20: - Pt with acute pancreatitis and ARF.  - Pt reports that he was eating well prior to admission. He has lost 3 lbs due to being in the hospitals, but says that he will be able to eat well once his diet is upgraded. He said that he has felt hungry and wanted to know when he would be able to get solid food.  - Pt with no signs of fat or muscle wasting.  9/25: - Found to have choledocholithiasis which was treated with biliary sphincterotomy and balloon sweeping 9/23 - Had acute hypoxic respiratory failure 9/25 and was transferred to SDU from [place], rapid response called - Having cough productive of white phlegm with streaks of blood - Plan is for cholecystectomy once pt medically stable  - Is NPO - Met with pt who denies any stomach issues and said his coughing is getting better  9/28: - Diet advanced to CHO modified 9/26 - Pt reports 100% meal intake and denies any abdominal pain or nausea - Pt declined wanting any nutritional supplements at this time   Most recent lipase elevated but trending down   Height: Ht Readings from Last 1 Encounters:  08/18/14 '5\' 10"'  (1.778 m)    Weight  Status:   Wt Readings from Last 1 Encounters:  08/21/14 176 lb 6.4 oz (80.015 kg)  Admit wt         175 lb (79.3 kg) Net I/Os: + 8.1L  Re-estimated needs:  Kcal: 1950-2250  Protein: 100-115 g  Fluid: 2.0-2.3 L/day   Skin: Non-pitting RLE, LLE edema  Diet Order: Carb Control   Intake/Output Summary (Last 24 hours) at 08/21/14 0912 Last data filed at 08/21/14 0600  Gross per 24 hour  Intake   1140 ml  Output   1325 ml  Net   -185 ml    Last BM: 9/28   Labs:   Recent Labs Lab 08/18/14 0435 08/19/14 0500 08/20/14 0437 08/21/14 0434  NA 138 136* 136* 136*  K 3.7 3.0* 3.3* 3.5*  CL 104 99 98 98  CO2 '21 25 24 24  ' BUN '13 13 12 12  ' CREATININE 0.74 0.82 0.90 0.94  CALCIUM 8.4 8.0* 8.1* 8.1*  MG  --  1.9  --   --   GLUCOSE 161* 132* 123* 126*    CBG (last 3)   Recent Labs  08/20/14 1721 08/20/14 2104 08/21/14 0708  GLUCAP 137* 155* 129*    Scheduled Meds: . antiseptic oral rinse  7 mL Mouth Rinse q12n4p  . arformoterol  15 mcg Nebulization BID  . aspirin EC  81 mg Oral Daily  . atorvastatin  20 mg  Oral q1800  . budesonide (PULMICORT) nebulizer solution  0.5 mg Nebulization BID  . carvedilol  25 mg Oral BID WC  . chlorhexidine  15 mL Mouth Rinse BID  . furosemide  40 mg Intravenous TID PC  . guaiFENesin  600 mg Oral Daily  . insulin aspart  0-9 Units Subcutaneous TID WC  . ipratropium-albuterol  3 mL Nebulization TID  . isosorbide-hydrALAZINE  1 tablet Oral TID  . lip balm  1 application Topical BID  . losartan  50 mg Oral Daily  . piperacillin-tazobactam (ZOSYN)  IV  3.375 g Intravenous 3 times per day  . saccharomyces boulardii  250 mg Oral BID  . sodium chloride  3 mL Intravenous Q12H    Carlis Stable MS, RD, LDN [phone] Pager [phone] Weekend/After Hours Pager

## 2014-08-21 NOTE — Progress Notes (Signed)
ANTIBIOTIC CONSULT NOTE - Follow up  Pharmacy Consult for Zosyn Indication: aspiration pneumonia, bacteremia from suspected GI source  Assessment: 80yoM with pancreatitis with CBD stone.  PMH COPD, HFpEF, DM, prostate cancer, HTN.  Txferred to ICU on 9/25 with worsening respiratory distress felt due to COPD, HF, and possible aspiration PNA from recent vomiting.  Was receiving unasyn for above, now spiking low-grade fever and WBC trending up.  Also noted to have 1/2 K pneumoniae bacteremia which may have occurred with ERCP.  Repeat BCx drawn and pharmacy consulted to transition patient to [place].  Tmax AF WBC elevated today, had been WNL Renal function intact; CrCl ~ 61ml/min  9/19 blood: 1/2 positive for k. Pneumoniae, pan-sensitive except for AMP 9/20 urine: NGF 9/20 sputum: normal flora 9/25 blood: NGTD x 2 9/25 MRSA PCR: negative 9/26 CXR: persistent consolidation at L lung base w/ pleural fluid   Goal of Therapy:  Eradication of infection Appropriate renal dosing of antibiotics  Plan:  Zosyn 3.375 g IV q8h EI Follow clinical course and cultures as available  Allergies  Allergen Reactions  . Amlodipine Besylate     REACTION: swelling  . Cardura [Doxazosin Mesylate] Itching  . Clonidine Derivatives Itching  . Fluticasone-Salmeterol Hives and Itching    Patient Measurements: Height: 5\' 10"  (177.8 cm) Weight: 176 lb 6.4 oz (80.015 kg) IBW/kg (Calculated) : 73 Adjusted Body Weight: n/a  Vital Signs: Temp: 98.5 F (36.9 C) (09/28 0536) Temp src: Oral (09/28 0536) BP: 118/54 mmHg (09/28 0919) Pulse Rate: 72 (09/28 0919) Intake/Output from previous day: 09/27 0701 - 09/28 0700 In: 1140 [P.O.:840; I.V.:150; IV Piggyback:150] Out: 1325 [Urine:1325] Intake/Output from this shift: Total I/O In: 360 [P.O.:360] Out: -   Labs:  Recent Labs  08/19/14 0500 08/20/14 0437 08/21/14 0434  WBC 9.1 10.5 12.1*  HGB 10.8* 10.6* 10.2*  PLT 195 244 237  CREATININE 0.82  0.90 0.94   Estimated Creatinine Clearance: 64.7 ml/min (by C-G formula based on Cr of 0.94). No results found for this basename: VANCOTROUGH, VANCOPEAK, VANCORANDOM, Makena, GENTPEAK, GENTRANDOM, [place], TOBRAPEAK, TOBRARND, AMIKACINPEAK, AMIKACINTROU, AMIKACIN,  in the last 72 hours   Microbiology: Recent Results (from the past 720 hour(s))  CULTURE, BLOOD (ROUTINE X 2)     Status: None   Collection Time    08/12/14  9:19 AM      Result Value Ref Range Status   Specimen Description BLOOD RIGHT ARM   Final   Special Requests     Final   Value: BOTTLES DRAWN AEROBIC AND ANAEROBIC 10CC BLUE BOTTLE, 7 CC RED BOTTLE   Culture  Setup Time     Final   Value: 08/12/2014 13:42     Performed at [facility]   Culture     Final   Value: NO GROWTH 5 DAYS     Performed at [facility]   Report Status 08/18/2014 FINAL   Final  CULTURE, BLOOD (ROUTINE X 2)     Status: None   Collection Time    08/12/14  9:24 AM      Result Value Ref Range Status   Specimen Description BLOOD RIGHT ARM   Final   Special Requests     Final   Value: BOTTLES DRAWN AEROBIC AND ANAEROBIC 10CC BOTH BOTTLES   Culture  Setup Time     Final   Value: 08/12/2014 13:42     Performed at [facility]   Culture     Final   Value:  KLEBSIELLA PNEUMONIAE     Note: Gram Stain Report Called to,Read Back By and Verified With: LEEANNE REINERT ON 08/16/2014 AT 11:55P BY WILEJ     Performed at [facility]   Report Status 08/18/2014 FINAL   Final   Organism ID, Bacteria KLEBSIELLA PNEUMONIAE   Final  CULTURE, EXPECTORATED SPUTUM-ASSESSMENT     Status: None   Collection Time    08/13/14  1:56 AM      Result Value Ref Range Status   Specimen Description SPUTUM   Final   Special Requests NONE   Final   Sputum evaluation     Final   Value: MICROSCOPIC FINDINGS SUGGEST THAT THIS SPECIMEN IS NOT REPRESENTATIVE OF LOWER RESPIRATORY SECRETIONS. PLEASE RECOLLECT.     Enid Derry AT 0216 ON  09.20.2015 BY NBROOKS   Report Status 08/13/2014 FINAL   Final  CULTURE, EXPECTORATED SPUTUM-ASSESSMENT     Status: None   Collection Time    08/13/14  6:47 AM      Result Value Ref Range Status   Specimen Description SPUTUM   Final   Special Requests NONE   Final   Sputum evaluation     Final   Value: THIS SPECIMEN IS ACCEPTABLE. RESPIRATORY CULTURE REPORT TO FOLLOW.   Report Status 08/13/2014 FINAL   Final  CULTURE, RESPIRATORY (NON-EXPECTORATED)     Status: None   Collection Time    08/13/14  6:47 AM      Result Value Ref Range Status   Specimen Description SPUTUM   Final   Special Requests NONE   Final   Gram Stain     Final   Value: FEW WBC PRESENT, PREDOMINANTLY MONONUCLEAR     RARE SQUAMOUS EPITHELIAL CELLS PRESENT     RARE GRAM POSITIVE COCCI     IN PAIRS IN CLUSTERS RARE GRAM NEGATIVE RODS     Performed at [facility]   Culture     Final   Value: NORMAL OROPHARYNGEAL FLORA     Performed at [facility]   Report Status 08/15/2014 FINAL   Final  URINE CULTURE     Status: None   Collection Time    08/13/14  1:58 PM      Result Value Ref Range Status   Specimen Description URINE, CATHETERIZED   Final   Special Requests NONE   Final   Culture  Setup Time     Final   Value: 08/13/2014 19:11     Performed at [facility]     Final   Value: NO GROWTH     Performed at [facility]   Culture     Final   Value: NO GROWTH     Performed at [facility]   Report Status 08/14/2014 FINAL   Final  SURGICAL PCR SCREEN     Status: Abnormal   Collection Time    08/18/14  1:05 AM      Result Value Ref Range Status   MRSA, PCR INVALID RESULTS, SPECIMEN SENT FOR CULTURE (*) NEGATIVE Final   Comment: RESULT CALLED TO, READ BACK BY AND VERIFIED WITH:     V.STEWART,RN AT 0303 ON 08/18/14 BY SHEAW   Staphylococcus aureus INVALID RESULTS, SPECIMEN SENT FOR CULTURE (*) NEGATIVE Final   Comment: RESULT CALLED TO, READ BACK BY AND  VERIFIED WITH:     V.STEWART,RN AT 0303 ON 08/18/14 BY SHEAW  The Xpert SA Assay (FDA     approved for NASAL specimens     in patients over 78 years of age),     is one component of     a comprehensive surveillance     program.  Test performance has     been validated by [employer] for patients greater     than or equal to 47 year old.     It is not intended     to diagnose infection nor to     guide or monitor treatment.  MRSA CULTURE     Status: None   Collection Time    08/18/14  1:05 AM      Result Value Ref Range Status   Specimen Description NOSE   Final   Special Requests NONE   Final   Culture     Final   Value: NO STAPHYLOCOCCUS AUREUS ISOLATED     Note: No MRSA Isolated     Performed at [facility]   Report Status 08/20/2014 FINAL   Final  CULTURE, BLOOD (ROUTINE X 2)     Status: None   Collection Time    08/18/14  8:31 AM      Result Value Ref Range Status   Specimen Description BLOOD LEFT HAND   Final   Special Requests BOTTLES DRAWN AEROBIC AND ANAEROBIC 10CC   Final   Culture  Setup Time     Final   Value: 08/18/2014 11:06     Performed at [facility]   Culture     Final   Value:        BLOOD CULTURE RECEIVED NO GROWTH TO DATE CULTURE WILL BE HELD FOR 5 DAYS BEFORE ISSUING A FINAL NEGATIVE REPORT     Performed at [facility]   Report Status PENDING   Incomplete  CULTURE, BLOOD (ROUTINE X 2)     Status: None   Collection Time    08/18/14  8:38 AM      Result Value Ref Range Status   Specimen Description BLOOD RIGHT ARM   Final   Special Requests BOTTLES DRAWN AEROBIC AND ANAEROBIC 10CC   Final   Culture  Setup Time     Final   Value: 08/18/2014 11:06     Performed at [facility]   Culture     Final   Value:        BLOOD CULTURE RECEIVED NO GROWTH TO DATE CULTURE WILL BE HELD FOR 5 DAYS BEFORE ISSUING A FINAL NEGATIVE REPORT     Performed at [facility]   Report Status PENDING   Incomplete     Medical History: Past Medical History  Diagnosis Date  . Type 2 diabetes mellitus 10/2009  . COPD (chronic obstructive pulmonary disease)   . Syncope 2010    Evaluated by Dr. Caryl Comes - normal LVEF, possibly neurally mediated  . Hyperlipidemia   . Essential hypertension, benign   . Prostate cancer 2008    s/p XRT  . ED (erectile dysfunction)   . Cholestatic hepatitis 2014  . Personal history of colonic polyps     Medications:  Infusions:  . sodium chloride 20 mL/hr at 08/19/14 1500   Anti-infectives   Start     Dose/Rate Route Frequency Ordered Stop   08/18/14 0930  piperacillin-tazobactam (ZOSYN) IVPB 3.375 g     3.375 g 12.5 mL/hr over 240 Minutes Intravenous 3 times per day 08/18/14 0915  08/18/14 0600  ampicillin-sulbactam (UNASYN) 1.5 g in sodium chloride 0.9 % 50 mL IVPB  Status:  Discontinued     1.5 g 100 mL/hr over 30 Minutes Intravenous On call to O.R. 08/17/14 1841 08/18/14 0800   08/13/14 1200  ampicillin-sulbactam (UNASYN) 1.5 g in sodium chloride 0.9 % 50 mL IVPB  Status:  Discontinued     1.5 g 100 mL/hr over 30 Minutes Intravenous 4 times per day 08/13/14 1104 08/18/14 0800   08/12/14 0800  levofloxacin (LEVAQUIN) IVPB 750 mg  Status:  Discontinued     750 mg 100 mL/hr over 90 Minutes Intravenous Every 24 hours 08/12/14 0746 08/13/14 1104      Dolly Rias RPh 08/21/2014, 9:59 AM Pager [phone]

## 2014-08-21 NOTE — Progress Notes (Signed)
Occupational Therapy Treatment Patient Details Name: Jim [name] MRN: [id number] DOB: 09-06-1932 Today's Date: 08/21/2014       OT comments  Pt with good participation with OT this session  Follow Up Recommendations  No OT follow up;Supervision/Assistance - 24 hour    Equipment Recommendations  Tub/shower seat       Precautions / Restrictions Precautions Precautions: Fall;Other (comment) (watch Os Sats)       Mobility Bed Mobility Overal bed mobility: Modified Independent                Transfers Overall transfer level: Needs assistance Equipment used: 1 person hand held assist   Sit to Stand: Min guard Stand pivot transfers: Min assist;Min guard                ADL                       Lower Body Dressing: Sit to/from stand;Min guard   Toilet Transfer: Nature conservation officer;Ambulation   Toileting- Clothing Manipulation and Hygiene: Min guard;Sit to/from stand       Functional mobility during ADLs: Min guard General ADL Comments: Provided and went over energy conservation handout with pt in which pt appreciated.  Educated pt on importance of oxygen- pt does not feel he will need it at all times.  Will continue to provide education      Vision                            Cognition   Behavior During Therapy: [facility] for tasks assessed/performed Overall Cognitive Status: Within Functional Limits for tasks assessed (poor insight/awareness into deficits)                        Home Living                                          Prior Functioning/Environment              Frequency Min 2X/week     Progress Toward Goals  OT Goals(current goals can now be found in the care plan section)     Acute Rehab OT Goals Patient Stated Goal: home soon to see his chihuahua OT Goal Formulation: With patient Time For Goal Achievement: 08/26/14 Potential to Achieve Goals: Good  Plan Discharge  plan needs to be updated       End of Session Equipment Utilized During Treatment: Oxygen   Activity Tolerance Patient tolerated treatment well   Patient Left in chair;with call bell/phone within reach;with chair alarm set   Nurse Communication Mobility status        Time: 6063-0160 OT Time Calculation (min): 24 min  Charges: OT General Charges $OT Visit: 1 Procedure OT Treatments $Self Care/Home Management : 8-22 mins  Kritika Stukes, Thereasa Parkin 08/21/2014, 12:54 PM

## 2014-08-21 NOTE — Progress Notes (Signed)
Patient ID: Jim [name], male   DOB: [birth year], 80 y.o.   MRN: [id number]     [facility]      [address] [place], [place] [address]    Phone: [phone] FAX: [phone]     Subjective: Tolerating PO.  No abdominal pain.  Had a BM this AM.  CXR a bit better today.  Afebrile, WBC up today.   Objective:  Vital signs:  Filed Vitals:   08/21/14 0457 08/21/14 0536 08/21/14 0714 08/21/14 0829  BP:  156/67 180/70   Pulse:  69 77 75  Temp:  98.5 F (36.9 C)    TempSrc:  Oral    Resp:  19  19  Height:      Weight: 176 lb 6.4 oz (80.015 kg)     SpO2:  98% 94% 95%    Last BM Date: 08/21/14  Intake/Output   Yesterday:  09/27 0701 - 09/28 0700 In: 1140 [P.O.:840; I.V.:150; IV Piggyback:150] Out: 1325 [Urine:1325] This shift:  Total I/O In: 360 [P.O.:360] Out: -    Physical Exam: General: Pt awake/alert/oriented x4 in no acute distress Chest: cta No chest wall pain w good excursion CV:  Pulses intact.  Regular rhythm MS: Normal AROM mjr joints.  No obvious deformity Abdomen: Soft.  Nondistended.  Non tender.  No evidence of peritonitis.  No incarcerated hernias.    Problem List:   Principal Problem:   Acute respiratory failure with hypoxia Active Problems:   HYPERLIPIDEMIA   HYPERTENSION   COPD (chronic obstructive pulmonary disease)   Acute pancreatitis   CAP (community acquired pneumonia)   Leukocytosis   Abnormal LFTs   Renal mass   Type II or unspecified type diabetes mellitus without mention of complication, not stated as uncontrolled   Nonspecific (abnormal) findings on radiological and other examination of biliary tract   Acute-on-chronic respiratory failure   Aspiration pneumonitis   Choledocholithiasis   Acute on chronic diastolic heart failure   Preoperative clearance    Results:   Labs: Results for orders placed during the hospital encounter of 08/12/14 (from the past 48 hour(s))   GLUCOSE, CAPILLARY     Status: Abnormal   Collection Time    08/19/14 11:57 AM      Result Value Ref Range   Glucose-Capillary 133 (*) 70 - 99 mg/dL  GLUCOSE, CAPILLARY     Status: Abnormal   Collection Time    08/19/14  4:17 PM      Result Value Ref Range   Glucose-Capillary 115 (*) 70 - 99 mg/dL  GLUCOSE, CAPILLARY     Status: Abnormal   Collection Time    08/19/14  9:35 PM      Result Value Ref Range   Glucose-Capillary 148 (*) 70 - 99 mg/dL  CBC     Status: Abnormal   Collection Time    08/20/14  4:37 AM      Result Value Ref Range   WBC 10.5  4.0 - 10.5 K/uL   RBC 3.33 (*) 4.22 - 5.81 MIL/uL   Hemoglobin 10.6 (*) 13.0 - 17.0 g/dL   HCT 30.4 (*) 39.0 - 52.0 %   MCV 91.3  78.0 - 100.0 fL   MCH 31.8  26.0 - 34.0 pg   MCHC 34.9  30.0 - 36.0 g/dL   RDW 13.2  11.5 - 15.5 %   Platelets 244  150 - 400 K/uL  COMPREHENSIVE METABOLIC PANEL  Status: Abnormal   Collection Time    08/20/14  4:37 AM      Result Value Ref Range   Sodium 136 (*) 137 - 147 mEq/L   Potassium 3.3 (*) 3.7 - 5.3 mEq/L   Chloride 98  96 - 112 mEq/L   CO2 24  19 - 32 mEq/L   Glucose, Bld 123 (*) 70 - 99 mg/dL   BUN 12  6 - 23 mg/dL   Creatinine, Ser 0.90  0.50 - 1.35 mg/dL   Calcium 8.1 (*) 8.4 - 10.5 mg/dL   Total Protein 5.8 (*) 6.0 - 8.3 g/dL   Albumin 2.3 (*) 3.5 - 5.2 g/dL   AST 16  0 - 37 U/L   ALT 15  0 - 53 U/L   Alkaline Phosphatase 91  39 - 117 U/L   Total Bilirubin 1.1  0.3 - 1.2 mg/dL   GFR calc non Af Amer 78 (*) >90 mL/min   GFR calc Af Amer >90  >90 mL/min   Comment: (NOTE)     The eGFR has been calculated using the CKD EPI equation.     This calculation has not been validated in all clinical situations.     eGFR's persistently <90 mL/min signify possible Chronic Kidney     Disease.   Anion gap 14  5 - 15  GLUCOSE, CAPILLARY     Status: Abnormal   Collection Time    08/20/14  8:02 AM      Result Value Ref Range   Glucose-Capillary 147 (*) 70 - 99 mg/dL  GLUCOSE, CAPILLARY      Status: Abnormal   Collection Time    08/20/14 12:33 PM      Result Value Ref Range   Glucose-Capillary 191 (*) 70 - 99 mg/dL  GLUCOSE, CAPILLARY     Status: Abnormal   Collection Time    08/20/14  5:21 PM      Result Value Ref Range   Glucose-Capillary 137 (*) 70 - 99 mg/dL  GLUCOSE, CAPILLARY     Status: Abnormal   Collection Time    08/20/14  9:04 PM      Result Value Ref Range   Glucose-Capillary 155 (*) 70 - 99 mg/dL  CBC     Status: Abnormal   Collection Time    08/21/14  4:34 AM      Result Value Ref Range   WBC 12.1 (*) 4.0 - 10.5 K/uL   RBC 3.20 (*) 4.22 - 5.81 MIL/uL   Hemoglobin 10.2 (*) 13.0 - 17.0 g/dL   HCT 29.8 (*) 39.0 - 52.0 %   MCV 93.1  78.0 - 100.0 fL   MCH 31.9  26.0 - 34.0 pg   MCHC 34.2  30.0 - 36.0 g/dL   RDW 13.1  11.5 - 15.5 %   Platelets 237  150 - 400 K/uL  COMPREHENSIVE METABOLIC PANEL     Status: Abnormal   Collection Time    08/21/14  4:34 AM      Result Value Ref Range   Sodium 136 (*) 137 - 147 mEq/L   Potassium 3.5 (*) 3.7 - 5.3 mEq/L   Chloride 98  96 - 112 mEq/L   CO2 24  19 - 32 mEq/L   Glucose, Bld 126 (*) 70 - 99 mg/dL   BUN 12  6 - 23 mg/dL   Creatinine, Ser 0.94  0.50 - 1.35 mg/dL   Calcium 8.1 (*) 8.4 - 10.5 mg/dL   Total  Protein 6.2  6.0 - 8.3 g/dL   Albumin 2.4 (*) 3.5 - 5.2 g/dL   AST 15  0 - 37 U/L   ALT 14  0 - 53 U/L   Alkaline Phosphatase 87  39 - 117 U/L   Total Bilirubin 1.0  0.3 - 1.2 mg/dL   GFR calc non Af Amer 77 (*) >90 mL/min   GFR calc Af Amer 89 (*) >90 mL/min   Comment: (NOTE)     The eGFR has been calculated using the CKD EPI equation.     This calculation has not been validated in all clinical situations.     eGFR's persistently <90 mL/min signify possible Chronic Kidney     Disease.   Anion gap 14  5 - 15  GLUCOSE, CAPILLARY     Status: Abnormal   Collection Time    08/21/14  7:08 AM      Result Value Ref Range   Glucose-Capillary 129 (*) 70 - 99 mg/dL    Imaging / Studies: Dg Chest 2  View  08/21/2014   CLINICAL DATA:  Cough and shortness of breath.  EXAM: CHEST  2 VIEW  COMPARISON:  08/19/2012.  FINDINGS: Improved interstitial edema. Persistent consolidation at the LEFT base with LEFT effusion. Stable linear scarring RIGHT upper lobe. Stable cardiomediastinal silhouette with calcified tortuous aorta.  IMPRESSION: Overall slight improvement aeration.   Electronically Signed   By: Rolla Flatten M.D.   On: 08/21/2014 08:18    Medications / Allergies:  Scheduled Meds: . antiseptic oral rinse  7 mL Mouth Rinse q12n4p  . arformoterol  15 mcg Nebulization BID  . aspirin EC  81 mg Oral Daily  . atorvastatin  20 mg Oral q1800  . budesonide (PULMICORT) nebulizer solution  0.5 mg Nebulization BID  . carvedilol  25 mg Oral BID WC  . chlorhexidine  15 mL Mouth Rinse BID  . furosemide  40 mg Intravenous TID PC  . guaiFENesin  600 mg Oral Daily  . insulin aspart  0-9 Units Subcutaneous TID WC  . ipratropium-albuterol  3 mL Nebulization TID  . isosorbide-hydrALAZINE  1 tablet Oral TID  . lip balm  1 application Topical BID  . losartan  50 mg Oral Daily  . piperacillin-tazobactam (ZOSYN)  IV  3.375 g Intravenous 3 times per day  . saccharomyces boulardii  250 mg Oral BID  . sodium chloride  3 mL Intravenous Q12H   Continuous Infusions: . sodium chloride 20 mL/hr at 08/19/14 1500   PRN Meds:.acetaminophen, acetaminophen, albuterol, alum & mag hydroxide-simeth, bisacodyl, hydrALAZINE, magic mouthwash, menthol-cetylpyridinium, morphine injection, ondansetron (ZOFRAN) IV, phenol, polyethylene glycol, promethazine  Antibiotics: Anti-infectives   Start     Dose/Rate Route Frequency Ordered Stop   08/18/14 0930  piperacillin-tazobactam (ZOSYN) IVPB 3.375 g     3.375 g 12.5 mL/hr over 240 Minutes Intravenous 3 times per day 08/18/14 0915     08/18/14 0600  ampicillin-sulbactam (UNASYN) 1.5 g in sodium chloride 0.9 % 50 mL IVPB  Status:  Discontinued     1.5 g 100 mL/hr over 30 Minutes  Intravenous On call to O.R. 08/17/14 1841 08/18/14 0800   08/13/14 1200  ampicillin-sulbactam (UNASYN) 1.5 g in sodium chloride 0.9 % 50 mL IVPB  Status:  Discontinued     1.5 g 100 mL/hr over 30 Minutes Intravenous 4 times per day 08/13/14 1104 08/18/14 0800   08/12/14 0800  levofloxacin (LEVAQUIN) IVPB 750 mg  Status:  Discontinued  750 mg 100 mL/hr over 90 Minutes Intravenous Every 24 hours 08/12/14 0746 08/13/14 1104        Assessment/Plan Acute on chronic respiratory failure COPD Acute pancreatitis CBD stone s/p ERCP Klebsiella bacteremia  DM Acute on chronic diastolic heart failure  Appreciate cardiology input, awaiting echocardiogram.  We will decide whether proceed with surgery after this is complete.  The patient is tolerating PO, benign abdominal exam.  Further management per primary team.  Will follow along.     Erby Pian, [facility] Surgery Pager [phone](7A-[phone])  08/21/2014 9:25 AM

## 2014-08-21 NOTE — Progress Notes (Signed)
Physical Therapy Treatment Patient Details Name: Jim [name] MRN: [id number] DOB: 1932/09/11 Today's Date: 08/21/2014    History of Present Illness Pt is an 80 year old male admitted with abdominal pain, shortness of breath--pancreatitis and 16 mm solid left lower pole renal mass ? CA . PMHx:HTN, DM, CHF, prostrate CA.  Plans per chart for ERCP on 9/23 at 9 am. Underwent ERCP 9/23. Rapid response 9/25 secondary to O2 Sats 81 with transfer to ICU. Transfer back to floor 9/26.    PT Comments    Pt feeling better.  Tolerated amb full unit on 3 lts.  Assisted back to bed for a nap.  Pt stated he is having surgery tomorrow to remove his gallbladder.   Follow Up Recommendations  Home health PT     Equipment Recommendations  None recommended by PT    Recommendations for Other Services       Precautions / Restrictions Precautions Precautions: Fall;Other (comment) (watch Os Sats) Precaution Comments: home O2    Mobility  Bed Mobility Overal bed mobility: Modified Independent             General bed mobility comments: assisted back to bed at Mod Indep  Transfers Overall transfer level: Needs assistance Equipment used: 1 person hand held assist Transfers: Sit to/from Stand Sit to Stand: Supervision;Min guard Stand pivot transfers: Min assist;Min guard       General transfer comment: good safety cognition  Ambulation/Gait Ambulation/Gait assistance: Supervision;Min guard Ambulation Distance (Feet): 300 Feet Assistive device: None Gait Pattern/deviations: Step-through pattern Gait velocity: WFL   General Gait Details: slight instability.  Amb on 3 lts sats avg 96%.  Demon 1/4 DOE.  Tolerated session well.   Stairs            Wheelchair Mobility    Modified Rankin (Stroke Patients Only)       Balance                                    Cognition Arousal/Alertness: Awake/alert Behavior During Therapy: WFL for tasks  assessed/performed Overall Cognitive Status: Within Functional Limits for tasks assessed (poor insight/awareness into deficits)                      Exercises      General Comments        Pertinent Vitals/Pain      Home Living                      Prior Function            PT Goals (current goals can now be found in the care plan section) Acute Rehab PT Goals Patient Stated Goal: home soon to see his chihuahua Progress towards PT goals: Progressing toward goals    Frequency  Min 3X/week    PT Plan      Co-evaluation             End of Session Equipment Utilized During Treatment: Gait belt;Oxygen Activity Tolerance: Patient tolerated treatment well Patient left: in bed;with call bell/phone within reach     Time: 1430-1440 PT Time Calculation (min): 10 min  Charges:  $Gait Training: 8-22 mins                    G Codes:      Rica Koyanagi  PTA [facility]  Acute  Rehab  Pager      [phone]

## 2014-08-21 NOTE — Progress Notes (Signed)
Echocardiogram 2D Echocardiogram has been performed.  Jaysie Benthall 08/21/2014, 10:35 AM

## 2014-08-21 NOTE — Progress Notes (Signed)
Subjective:  Denies CP or SOB. Admitted 9 days ago with ? Sepsis/pancreatitis/cholecystitis.   Objective:  Temp:  [98.4 F (36.9 C)-99.1 F (37.3 C)] 98.5 F (36.9 C) (09/28 0536) Pulse Rate:  [69-84] 77 (09/28 0714) Resp:  [18-20] 19 (09/28 0536) BP: (94-183)/(58-142) 180/70 mmHg (09/28 0714) SpO2:  [94 %-98 %] 94 % (09/28 0714) Weight:  [176 lb 6.4 oz (80.015 kg)] 176 lb 6.4 oz (80.015 kg) (09/28 0457) Weight change: -3 lb 8 oz (-1.588 kg)  Intake/Output from previous day: 09/27 0701 - 09/28 0700 In: 1140 [P.O.:840; I.V.:150; IV Piggyback:150] Out: 1325 [Urine:1325]  Intake/Output from this shift:    Physical Exam: General appearance: alert and no distress Neck: no adenopathy, no carotid bruit, no JVD, supple, symmetrical, trachea midline and thyroid not enlarged, symmetric, no tenderness/mass/nodules Lungs: clear to auscultation bilaterally Heart: regular rate and rhythm, S1, S2 normal, no murmur, click, rub or gallop Extremities: extremities normal, atraumatic, no cyanosis or edema  Lab Results: Results for orders placed during the hospital encounter of 08/12/14 (from the past 48 hour(s))  GLUCOSE, CAPILLARY     Status: Abnormal   Collection Time    08/19/14  7:56 AM      Result Value Ref Range   Glucose-Capillary 138 (*) 70 - 99 mg/dL   Comment 1 Documented in Chart     Comment 2 Notify RN    GLUCOSE, CAPILLARY     Status: Abnormal   Collection Time    08/19/14 11:57 AM      Result Value Ref Range   Glucose-Capillary 133 (*) 70 - 99 mg/dL  GLUCOSE, CAPILLARY     Status: Abnormal   Collection Time    08/19/14  4:17 PM      Result Value Ref Range   Glucose-Capillary 115 (*) 70 - 99 mg/dL  GLUCOSE, CAPILLARY     Status: Abnormal   Collection Time    08/19/14  9:35 PM      Result Value Ref Range   Glucose-Capillary 148 (*) 70 - 99 mg/dL  CBC     Status: Abnormal   Collection Time    08/20/14  4:37 AM      Result Value Ref Range   WBC 10.5  4.0 -  10.5 K/uL   RBC 3.33 (*) 4.22 - 5.81 MIL/uL   Hemoglobin 10.6 (*) 13.0 - 17.0 g/dL   HCT 30.4 (*) 39.0 - 52.0 %   MCV 91.3  78.0 - 100.0 fL   MCH 31.8  26.0 - 34.0 pg   MCHC 34.9  30.0 - 36.0 g/dL   RDW 13.2  11.5 - 15.5 %   Platelets 244  150 - 400 K/uL  COMPREHENSIVE METABOLIC PANEL     Status: Abnormal   Collection Time    08/20/14  4:37 AM      Result Value Ref Range   Sodium 136 (*) 137 - 147 mEq/L   Potassium 3.3 (*) 3.7 - 5.3 mEq/L   Chloride 98  96 - 112 mEq/L   CO2 24  19 - 32 mEq/L   Glucose, Bld 123 (*) 70 - 99 mg/dL   BUN 12  6 - 23 mg/dL   Creatinine, Ser 0.90  0.50 - 1.35 mg/dL   Calcium 8.1 (*) 8.4 - 10.5 mg/dL   Total Protein 5.8 (*) 6.0 - 8.3 g/dL   Albumin 2.3 (*) 3.5 - 5.2 g/dL   AST 16  0 - 37 U/L   ALT 15  0 - 53 U/L   Alkaline Phosphatase 91  39 - 117 U/L   Total Bilirubin 1.1  0.3 - 1.2 mg/dL   GFR calc non Af Amer 78 (*) >90 mL/min   GFR calc Af Amer >90  >90 mL/min   Comment: (NOTE)     The eGFR has been calculated using the CKD EPI equation.     This calculation has not been validated in all clinical situations.     eGFR's persistently <90 mL/min signify possible Chronic Kidney     Disease.   Anion gap 14  5 - 15  GLUCOSE, CAPILLARY     Status: Abnormal   Collection Time    08/20/14  8:02 AM      Result Value Ref Range   Glucose-Capillary 147 (*) 70 - 99 mg/dL  GLUCOSE, CAPILLARY     Status: Abnormal   Collection Time    08/20/14 12:33 PM      Result Value Ref Range   Glucose-Capillary 191 (*) 70 - 99 mg/dL  GLUCOSE, CAPILLARY     Status: Abnormal   Collection Time    08/20/14  5:21 PM      Result Value Ref Range   Glucose-Capillary 137 (*) 70 - 99 mg/dL  GLUCOSE, CAPILLARY     Status: Abnormal   Collection Time    08/20/14  9:04 PM      Result Value Ref Range   Glucose-Capillary 155 (*) 70 - 99 mg/dL  CBC     Status: Abnormal   Collection Time    08/21/14  4:34 AM      Result Value Ref Range   WBC 12.1 (*) 4.0 - 10.5 K/uL   RBC  3.20 (*) 4.22 - 5.81 MIL/uL   Hemoglobin 10.2 (*) 13.0 - 17.0 g/dL   HCT 29.8 (*) 39.0 - 52.0 %   MCV 93.1  78.0 - 100.0 fL   MCH 31.9  26.0 - 34.0 pg   MCHC 34.2  30.0 - 36.0 g/dL   RDW 13.1  11.5 - 15.5 %   Platelets 237  150 - 400 K/uL  COMPREHENSIVE METABOLIC PANEL     Status: Abnormal   Collection Time    08/21/14  4:34 AM      Result Value Ref Range   Sodium 136 (*) 137 - 147 mEq/L   Potassium 3.5 (*) 3.7 - 5.3 mEq/L   Chloride 98  96 - 112 mEq/L   CO2 24  19 - 32 mEq/L   Glucose, Bld 126 (*) 70 - 99 mg/dL   BUN 12  6 - 23 mg/dL   Creatinine, Ser 0.94  0.50 - 1.35 mg/dL   Calcium 8.1 (*) 8.4 - 10.5 mg/dL   Total Protein 6.2  6.0 - 8.3 g/dL   Albumin 2.4 (*) 3.5 - 5.2 g/dL   AST 15  0 - 37 U/L   ALT 14  0 - 53 U/L   Alkaline Phosphatase 87  39 - 117 U/L   Total Bilirubin 1.0  0.3 - 1.2 mg/dL   GFR calc non Af Amer 77 (*) >90 mL/min   GFR calc Af Amer 89 (*) >90 mL/min   Comment: (NOTE)     The eGFR has been calculated using the CKD EPI equation.     This calculation has not been validated in all clinical situations.     eGFR's persistently <90 mL/min signify possible Chronic Kidney     Disease.   Anion gap 14  5 - 15    Imaging: Imaging results have been reviewed   Assessment/Plan:   1. Principal Problem: 2.   Acute respiratory failure with hypoxia 3. Active Problems: 4.   HYPERLIPIDEMIA 5.   HYPERTENSION 6.   COPD (chronic obstructive pulmonary disease) 7.   Acute pancreatitis 8.   CAP (community acquired pneumonia) 13.   Leukocytosis 10.   Abnormal LFTs 11.   Renal mass 12.   Type II or unspecified type diabetes mellitus without mention of complication, not stated as uncontrolled 13.   Nonspecific (abnormal) findings on radiological and other examination of biliary tract 14.   Acute-on-chronic respiratory failure 15.   Aspiration pneumonitis 16.   Choledocholithiasis 17.   Acute on chronic diastolic heart failure 18.   Preoperative  clearance 19.   Time Spent Directly with Patient:  20 minutes  Length of Stay:  LOS: 9 days   Pr admitted with an abdominal process which seems to be quiescent now. No prior cardiac history. Nl LV fxn in past. 2D pending today. EKG w/o acute changes. CXR with mild interstitial edema. BNP 770. He is on lasix q8 hrs but I/O +++ over 7 L since admission. Renal fxn nl. Exam benign. Abd soft with nl BS. Will follow with you and review 2D. If LV fxn nl OK to proceed with any planned surgical procedure at acceptable CV risk.  Lorretta Harp 08/21/2014, 7:15 AM

## 2014-08-22 ENCOUNTER — Encounter (HOSPITAL_COMMUNITY): Payer: Self-pay | Admitting: Certified Registered Nurse Anesthetist

## 2014-08-22 ENCOUNTER — Inpatient Hospital Stay (HOSPITAL_COMMUNITY): Payer: Medicare Other | Admitting: Anesthesiology

## 2014-08-22 ENCOUNTER — Encounter (HOSPITAL_COMMUNITY)
Admission: EM | Disposition: A | Discharge status: Home Health | Payer: Self-pay | Source: Home / Self Care | Attending: Internal Medicine

## 2014-08-22 ENCOUNTER — Inpatient Hospital Stay (HOSPITAL_COMMUNITY): Payer: Medicare Other

## 2014-08-22 ENCOUNTER — Encounter (HOSPITAL_COMMUNITY): Payer: Medicare Other | Admitting: Anesthesiology

## 2014-08-22 HISTORY — PX: CHOLECYSTECTOMY: SHX55

## 2014-08-22 LAB — COMPREHENSIVE METABOLIC PANEL
ALT: 14 U/L (ref 0–53)
AST: 17 U/L (ref 0–37)
Albumin: 2.6 g/dL — ABNORMAL LOW (ref 3.5–5.2)
Alkaline Phosphatase: 86 U/L (ref 39–117)
Anion gap: 13 (ref 5–15)
BUN: 11 mg/dL (ref 6–23)
CHLORIDE: 97 meq/L (ref 96–112)
CO2: 25 meq/L (ref 19–32)
Calcium: 8.2 mg/dL — ABNORMAL LOW (ref 8.4–10.5)
Creatinine, Ser: 0.93 mg/dL (ref 0.50–1.35)
GFR calc Af Amer: 90 mL/min — ABNORMAL LOW (ref >90)
GFR calc non Af Amer: 77 mL/min — ABNORMAL LOW (ref >90)
GLUCOSE: 122 mg/dL — AB (ref 70–99)
POTASSIUM: 3.6 meq/L — AB (ref 3.7–5.3)
Sodium: 135 mEq/L — ABNORMAL LOW (ref 137–147)
TOTAL PROTEIN: 6 g/dL (ref 6.0–8.3)
Total Bilirubin: 0.9 mg/dL (ref 0.3–1.2)

## 2014-08-22 LAB — GLUCOSE, CAPILLARY
GLUCOSE-CAPILLARY: 137 mg/dL — AB (ref 70–99)
GLUCOSE-CAPILLARY: 150 mg/dL — AB (ref 70–99)
GLUCOSE-CAPILLARY: 160 mg/dL — AB (ref 70–99)
GLUCOSE-CAPILLARY: 196 mg/dL — AB (ref 70–99)
Glucose-Capillary: 124 mg/dL — ABNORMAL HIGH (ref 70–99)
Glucose-Capillary: 147 mg/dL — ABNORMAL HIGH (ref 70–99)

## 2014-08-22 LAB — CBC
HCT: 30.7 % — ABNORMAL LOW (ref 39.0–52.0)
HEMOGLOBIN: 10.4 g/dL — AB (ref 13.0–17.0)
MCH: 31.3 pg (ref 26.0–34.0)
MCHC: 33.9 g/dL (ref 30.0–36.0)
MCV: 92.5 fL (ref 78.0–100.0)
Platelets: 289 10*3/uL (ref 150–400)
RBC: 3.32 MIL/uL — AB (ref 4.22–5.81)
RDW: 13 % (ref 11.5–15.5)
WBC: 11.2 10*3/uL — ABNORMAL HIGH (ref 4.0–10.5)

## 2014-08-22 LAB — ABO/RH: ABO/RH(D): O POS

## 2014-08-22 LAB — SURGICAL PCR SCREEN
MRSA, PCR: NEGATIVE
Staphylococcus aureus: NEGATIVE

## 2014-08-22 LAB — TYPE AND SCREEN
ABO/RH(D): O POS
Antibody Screen: NEGATIVE

## 2014-08-22 SURGERY — LAPAROSCOPIC CHOLECYSTECTOMY WITH INTRAOPERATIVE CHOLANGIOGRAM
Anesthesia: General | Site: Abdomen

## 2014-08-22 MED ORDER — FENTANYL CITRATE 0.05 MG/ML IJ SOLN
INTRAMUSCULAR | Status: AC
  Filled 2014-08-22: qty 5

## 2014-08-22 MED ORDER — FENTANYL CITRATE 0.05 MG/ML IJ SOLN
INTRAMUSCULAR | Status: AC
  Filled 2014-08-22: qty 2

## 2014-08-22 MED ORDER — ETOMIDATE 2 MG/ML IV SOLN
INTRAVENOUS | Status: AC
  Filled 2014-08-22: qty 10

## 2014-08-22 MED ORDER — DEXAMETHASONE SODIUM PHOSPHATE 10 MG/ML IJ SOLN
INTRAMUSCULAR | Status: DC | PRN
  Administered 2014-08-22: 10 mg via INTRAVENOUS

## 2014-08-22 MED ORDER — EPHEDRINE SULFATE 50 MG/ML IJ SOLN
INTRAMUSCULAR | Status: AC
  Filled 2014-08-22: qty 1

## 2014-08-22 MED ORDER — GLYCOPYRROLATE 0.2 MG/ML IJ SOLN
INTRAMUSCULAR | Status: DC | PRN
  Administered 2014-08-22: .2 mg via INTRAVENOUS

## 2014-08-22 MED ORDER — DEXAMETHASONE SODIUM PHOSPHATE 10 MG/ML IJ SOLN
INTRAMUSCULAR | Status: AC
  Filled 2014-08-22: qty 1

## 2014-08-22 MED ORDER — BUDESONIDE 0.25 MG/2ML IN SUSP
0.2500 mg | Freq: Two times a day (BID) | RESPIRATORY_TRACT | Status: DC
  Administered 2014-08-22 – 2014-08-23 (×2): 0.25 mg via RESPIRATORY_TRACT
  Filled 2014-08-22 (×5): qty 2

## 2014-08-22 MED ORDER — 0.9 % SODIUM CHLORIDE (POUR BTL) OPTIME
TOPICAL | Status: DC | PRN
  Administered 2014-08-22: 1000 mL

## 2014-08-22 MED ORDER — FENTANYL CITRATE 0.05 MG/ML IJ SOLN
25.0000 ug | INTRAMUSCULAR | Status: DC | PRN
  Administered 2014-08-22: 25 ug via INTRAVENOUS

## 2014-08-22 MED ORDER — BUPIVACAINE-EPINEPHRINE (PF) 0.25% -1:200000 IJ SOLN
INTRAMUSCULAR | Status: AC
  Filled 2014-08-22: qty 30

## 2014-08-22 MED ORDER — CISATRACURIUM BESYLATE 20 MG/10ML IV SOLN
INTRAVENOUS | Status: AC
  Filled 2014-08-22: qty 10

## 2014-08-22 MED ORDER — ETOMIDATE 2 MG/ML IV SOLN
INTRAVENOUS | Status: DC | PRN
  Administered 2014-08-22: 6 mg via INTRAVENOUS

## 2014-08-22 MED ORDER — SUCCINYLCHOLINE CHLORIDE 20 MG/ML IJ SOLN
INTRAMUSCULAR | Status: DC | PRN
  Administered 2014-08-22: 100 mg via INTRAVENOUS

## 2014-08-22 MED ORDER — SODIUM CHLORIDE 3 % IN NEBU
INHALATION_SOLUTION | RESPIRATORY_TRACT | Status: AC
  Filled 2014-08-22: qty 15

## 2014-08-22 MED ORDER — NEOSTIGMINE METHYLSULFATE 10 MG/10ML IV SOLN
INTRAVENOUS | Status: DC | PRN
  Administered 2014-08-22: 1 mg via INTRAVENOUS

## 2014-08-22 MED ORDER — ENOXAPARIN SODIUM 40 MG/0.4ML ~~LOC~~ SOLN
40.0000 mg | SUBCUTANEOUS | Status: DC
  Administered 2014-08-23: 40 mg via SUBCUTANEOUS
  Filled 2014-08-22 (×2): qty 0.4

## 2014-08-22 MED ORDER — SODIUM CHLORIDE 0.9 % IV SOLN
10.0000 mg | INTRAVENOUS | Status: DC | PRN
  Administered 2014-08-22: 40 ug/min via INTRAVENOUS

## 2014-08-22 MED ORDER — EPHEDRINE SULFATE 50 MG/ML IJ SOLN
INTRAMUSCULAR | Status: DC | PRN
  Administered 2014-08-22 (×3): 5 mg via INTRAVENOUS

## 2014-08-22 MED ORDER — CISATRACURIUM BESYLATE (PF) 10 MG/5ML IV SOLN: INTRAVENOUS | Status: DC | PRN

## 2014-08-22 MED ORDER — FENTANYL CITRATE 0.05 MG/ML IJ SOLN
INTRAMUSCULAR | Status: DC | PRN
  Administered 2014-08-22 (×3): 25 ug via INTRAVENOUS

## 2014-08-22 MED ORDER — CISATRACURIUM BESYLATE (PF) 10 MG/5ML IV SOLN
INTRAVENOUS | Status: DC | PRN
  Administered 2014-08-22: 6 mg via INTRAVENOUS

## 2014-08-22 MED ORDER — MORPHINE SULFATE 2 MG/ML IJ SOLN: 0.5000 mg | INTRAMUSCULAR | Status: DC | PRN

## 2014-08-22 MED ORDER — LIDOCAINE HCL (CARDIAC) 20 MG/ML IV SOLN
INTRAVENOUS | Status: DC | PRN
Start: 2014-08-22 — End: 2014-08-22
  Administered 2014-08-22: 75 mg via INTRAVENOUS

## 2014-08-22 MED ORDER — INSULIN ASPART 100 UNIT/ML ~~LOC~~ SOLN
0.0000 [IU] | SUBCUTANEOUS | Status: DC
  Administered 2014-08-22: 1 [IU] via SUBCUTANEOUS
  Administered 2014-08-22: 2 [IU] via SUBCUTANEOUS
  Administered 2014-08-22 – 2014-08-23 (×4): 1 [IU] via SUBCUTANEOUS
  Administered 2014-08-23: 3 [IU] via SUBCUTANEOUS

## 2014-08-22 MED ORDER — PIPERACILLIN-TAZOBACTAM 3.375 G IVPB
INTRAVENOUS | Status: AC
  Filled 2014-08-22: qty 50

## 2014-08-22 MED ORDER — ACETAMINOPHEN 10 MG/ML IV SOLN
1000.0000 mg | Freq: Once | INTRAVENOUS | Status: AC
  Administered 2014-08-22: 1000 mg via INTRAVENOUS
  Filled 2014-08-22 (×2): qty 100

## 2014-08-22 MED ORDER — PROPOFOL 10 MG/ML IV BOLUS
INTRAVENOUS | Status: DC | PRN
  Administered 2014-08-22: 75 mg via INTRAVENOUS
  Administered 2014-08-22: 25 mg via INTRAVENOUS

## 2014-08-22 MED ORDER — OXYCODONE-ACETAMINOPHEN 5-325 MG PO TABS: 1.0000 | ORAL_TABLET | ORAL | Status: DC | PRN

## 2014-08-22 MED ORDER — SODIUM CHLORIDE 0.9 % IJ SOLN
INTRAMUSCULAR | Status: AC
  Filled 2014-08-22: qty 10

## 2014-08-22 MED ORDER — BUPIVACAINE-EPINEPHRINE 0.25% -1:200000 IJ SOLN
INTRAMUSCULAR | Status: DC | PRN
  Administered 2014-08-22: 18 mL

## 2014-08-22 MED ORDER — ALBUTEROL SULFATE (2.5 MG/3ML) 0.083% IN NEBU: 2.5000 mg | INHALATION_SOLUTION | Freq: Four times a day (QID) | RESPIRATORY_TRACT | Status: DC | PRN

## 2014-08-22 MED ORDER — SODIUM CHLORIDE 0.9 % IJ SOLN
INTRAMUSCULAR | Status: DC | PRN
  Administered 2014-08-22: 14:00:00

## 2014-08-22 MED ORDER — ALBUTEROL SULFATE (2.5 MG/3ML) 0.083% IN NEBU
INHALATION_SOLUTION | RESPIRATORY_TRACT | Status: AC
  Administered 2014-08-22: 2.5 mg
  Filled 2014-08-22: qty 3

## 2014-08-22 MED ORDER — LACTATED RINGERS IV SOLN
INTRAVENOUS | Status: DC | PRN
  Administered 2014-08-22: 1000 mL

## 2014-08-22 MED ORDER — PROMETHAZINE HCL 25 MG/ML IJ SOLN: 6.2500 mg | INTRAMUSCULAR | Status: DC | PRN

## 2014-08-22 MED ORDER — LACTATED RINGERS IV SOLN
INTRAVENOUS | Status: DC | PRN
  Administered 2014-08-22: 13:00:00 via INTRAVENOUS

## 2014-08-22 MED ORDER — LIDOCAINE HCL (CARDIAC) 20 MG/ML IV SOLN
INTRAVENOUS | Status: AC
  Filled 2014-08-22: qty 5

## 2014-08-22 MED ORDER — LACTATED RINGERS IV SOLN
INTRAVENOUS | Status: DC | PRN
  Administered 2014-08-22: 12:00:00 via INTRAVENOUS

## 2014-08-22 MED ORDER — ONDANSETRON HCL 4 MG/2ML IJ SOLN
INTRAMUSCULAR | Status: AC
  Filled 2014-08-22: qty 2

## 2014-08-22 MED ORDER — ASPIRIN EC 81 MG PO TBEC
81.0000 mg | DELAYED_RELEASE_TABLET | Freq: Every day | ORAL | Status: DC
  Administered 2014-08-23: 81 mg via ORAL
  Filled 2014-08-22: qty 1

## 2014-08-22 MED ORDER — PIPERACILLIN-TAZOBACTAM 3.375 G IVPB 30 MIN
INTRAVENOUS | Status: DC | PRN
  Administered 2014-08-22: 3.375 g via INTRAVENOUS

## 2014-08-22 MED ORDER — ATROPINE SULFATE 0.4 MG/ML IJ SOLN
INTRAMUSCULAR | Status: AC
  Filled 2014-08-22: qty 1

## 2014-08-22 MED ORDER — ONDANSETRON HCL 4 MG/2ML IJ SOLN
INTRAMUSCULAR | Status: DC | PRN
  Administered 2014-08-22 (×2): 2 mg via INTRAVENOUS

## 2014-08-22 MED ORDER — PROPOFOL 10 MG/ML IV BOLUS
INTRAVENOUS | Status: AC
  Filled 2014-08-22: qty 20

## 2014-08-22 MED ORDER — PHENYLEPHRINE HCL 10 MG/ML IJ SOLN
INTRAMUSCULAR | Status: DC | PRN
  Administered 2014-08-22 (×2): 40 ug via INTRAVENOUS

## 2014-08-22 SURGICAL SUPPLY — 42 items
ADH SKN CLS APL DERMABOND .7 (GAUZE/BANDAGES/DRESSINGS) ×1
APL SKNCLS STERI-STRIP NONHPOA (GAUZE/BANDAGES/DRESSINGS)
APPLIER CLIP 5 13 M/L LIGAMAX5 (MISCELLANEOUS) ×3
APPLIER CLIP ROT 10 11.4 M/L (STAPLE)
APR CLP MED LRG 11.4X10 (STAPLE)
BAG SPEC RTRVL 10 TROC 200 (ENDOMECHANICALS) ×1
BAG SPEC RTRVL LRG 6X4 10 (ENDOMECHANICALS) ×1
BENZOIN TINCTURE PRP APPL 2/3 (GAUZE/BANDAGES/DRESSINGS) IMPLANT
CHLORAPREP W/TINT 26ML (MISCELLANEOUS) ×3 IMPLANT
CLIP APPLIE 5 13 M/L LIGAMAX5 (MISCELLANEOUS) ×1 IMPLANT
CLIP APPLIE ROT 10 11.4 M/L (STAPLE) IMPLANT
CLOSURE WOUND 1/2 X4 (GAUZE/BANDAGES/DRESSINGS)
COVER MAYO STAND STRL (DRAPES) ×3 IMPLANT
DECANTER SPIKE VIAL GLASS SM (MISCELLANEOUS) ×3 IMPLANT
DERMABOND ADVANCED (GAUZE/BANDAGES/DRESSINGS) ×2
DERMABOND ADVANCED .7 DNX12 (GAUZE/BANDAGES/DRESSINGS) ×1 IMPLANT
DRAPE C-ARM 42X120 X-RAY (DRAPES) ×3 IMPLANT
DRAPE LAPAROSCOPIC ABDOMINAL (DRAPES) ×3 IMPLANT
DRSG TEGADERM 2-3/8X2-3/4 SM (GAUZE/BANDAGES/DRESSINGS) IMPLANT
ELECT REM PT RETURN 9FT ADLT (ELECTROSURGICAL) ×3
ELECTRODE REM PT RTRN 9FT ADLT (ELECTROSURGICAL) ×1 IMPLANT
GLOVE BIOGEL M STRL SZ7.5 (GLOVE) ×3 IMPLANT
GLOVE BIOGEL PI IND STRL 6.5 (GLOVE) ×2 IMPLANT
GLOVE BIOGEL PI INDICATOR 6.5 (GLOVE) ×4
GLOVE SURG SS PI 6.5 STRL IVOR (GLOVE) ×3 IMPLANT
GOWN STRL REUS W/TWL XL LVL3 (GOWN DISPOSABLE) ×9 IMPLANT
HEMOSTAT SNOW SURGICEL 2X4 (HEMOSTASIS) ×3 IMPLANT
KIT BASIN OR (CUSTOM PROCEDURE TRAY) ×3 IMPLANT
POUCH RETRIEVAL ECOSAC 10 (ENDOMECHANICALS) ×1 IMPLANT
POUCH RETRIEVAL ECOSAC 10MM (ENDOMECHANICALS) ×2
POUCH SPECIMEN RETRIEVAL 10MM (ENDOMECHANICALS) ×3 IMPLANT
SET CHOLANGIOGRAPH MIX (MISCELLANEOUS) ×3 IMPLANT
SET IRRIG TUBING LAPAROSCOPIC (IRRIGATION / IRRIGATOR) ×3 IMPLANT
SLEEVE XCEL OPT CAN 5 100 (ENDOMECHANICALS) ×6 IMPLANT
STRIP CLOSURE SKIN 1/2X4 (GAUZE/BANDAGES/DRESSINGS) IMPLANT
SUT MNCRL AB 4-0 PS2 18 (SUTURE) ×3 IMPLANT
SUT VIC AB 0 UR5 27 (SUTURE) ×3 IMPLANT
TOWEL OR 17X26 10 PK STRL BLUE (TOWEL DISPOSABLE) ×3 IMPLANT
TOWEL OR NON WOVEN STRL DISP B (DISPOSABLE) ×3 IMPLANT
TRAY LAPAROSCOPIC (CUSTOM PROCEDURE TRAY) ×3 IMPLANT
TROCAR BLADELESS OPT 5 100 (ENDOMECHANICALS) ×3 IMPLANT
TROCAR XCEL BLUNT TIP 100MML (ENDOMECHANICALS) ×3 IMPLANT

## 2014-08-22 NOTE — Transfer of Care (Signed)
Immediate Anesthesia Transfer of Care Note  Patient: Jim [name]  Procedure(s) Performed: Procedure(s): LAPAROSCOPIC CHOLECYSTECTOMY WITH INTRAOPERATIVE CHOLANGIOGRAM (N/A)  Patient Location: PACU  Anesthesia Type:General  Level of Consciousness: awake, alert , oriented and patient cooperative  Airway & Oxygen Therapy: Patient Spontanous Breathing and Patient connected to face mask oxygen  Post-op Assessment: Report given to PACU RN, Post -op Vital signs reviewed and stable and Patient moving all extremities  Post vital signs: Reviewed and stable  Complications: No apparent anesthesia complications

## 2014-08-22 NOTE — Progress Notes (Signed)
No changes.  Alert, nad Soft, nt, nd  Shariq Puig M. [name], MD, FACS General, Bariatric, & Minimally Invasive Surgery [facility], [place]

## 2014-08-22 NOTE — Interval H&P Note (Signed)
History and Physical Interval Note:  08/22/2014 11:52 AM  Jim [name]  has presented today for surgery, with the diagnosis of cholelithiasis  The various methods of treatment have been discussed with the patient and family. After consideration of risks, benefits and other options for treatment, the patient has consented to  Procedure(s): LAPAROSCOPIC CHOLECYSTECTOMY WITH INTRAOPERATIVE CHOLANGIOGRAM (N/A) as a surgical intervention .  The patient's history has been reviewed, patient examined, no change in status, stable for surgery.  I have reviewed the patient's chart and labs.  Questions were answered to the patient's satisfaction.    [name]. [name], MD, [place], Bariatric, & Minimally Invasive Surgery [facility], [place]   [facility] M

## 2014-08-22 NOTE — H&P (View-Only) (Signed)
No changes.  Alert, nad Soft, nt, nd  Jim Wahab M. [name], MD, FACS General, Bariatric, & Minimally Invasive Surgery [facility], [place]

## 2014-08-22 NOTE — Progress Notes (Signed)
Spoke with Dr Sheran Fava regarding patient medications preoperatively.  Instructed to give all cardiac drugs with the exception of Lasix.

## 2014-08-22 NOTE — Progress Notes (Signed)
OR given report on patient current vital signs.

## 2014-08-22 NOTE — Anesthesia Postprocedure Evaluation (Signed)
  Anesthesia Post-op Note  Patient: Jim [name]  Procedure(s) Performed: Procedure(s) (LRB): LAPAROSCOPIC CHOLECYSTECTOMY WITH INTRAOPERATIVE CHOLANGIOGRAM (N/A)  Patient Location: PACU  Anesthesia Type: General  Level of Consciousness: awake and alert   Airway and Oxygen Therapy: Patient Spontanous Breathing  Post-op Pain: mild  Post-op Assessment: Post-op Vital signs reviewed, Patient's Cardiovascular Status Stable, Respiratory Function Stable, Patent Airway and No signs of Nausea or vomiting  Last Vitals:  Filed Vitals:   08/22/14 1408  BP: 137/65  Pulse: 62  Temp: 36.7 C  Resp: 17    Post-op Vital Signs: stable   Complications: No apparent anesthesia complications

## 2014-08-22 NOTE — Progress Notes (Signed)
[facility] PROGRESS NOTE  Jim [name] IOE:[id number] DOB: December 31, 1931 DOA: 08/12/2014 PCP: [name], MD  Brief Summary  Interim summary  80 year old male with past medical history of hypertension, dyslipidemia, diabetes, chronic diastolic CHF, history of prostate cancer who presented to [facility] ED 08/12/2014 with worsening abdominal pain over past 24 hours prior to this admission. Patient explains pain is in the upper and mid abdomen associated with multiple episodes of nonbloody vomiting. In emergency department, the patient was noted to have a lipase >3000, AST 65, ALT 90, ALT 187, total bilirubin 6.5. BNP was 772, WBC 15.6. CT abdomen showed focal midline retroperitoneal inflammation unclear if this is sequela of duodenitis or possible pancreatitis, possible mid to proximal small bowel wall thickening, non-obstructing sub-centimeters distal common bile duct lesion which could reflect polyp or less likely neoplasm, 16 mm solid left lower pole renal mass highly concerning for renal cell carcinoma.  The patient was placed on bowel rest and started on intravenous fluids for his acute pancreatitis. Gastroenterology was consulted. MRCP was performed as well as MRI of the abdomen to clarify his left renal mass. The MRCP revealed a distal common bile duct filling defect concerning for a stone. MRI of the abdomen revealed a suspicious left renal lesion concerning for renal cell carcinoma. Urology has been consulted, and felt due to slow growing nature of these masses, pt can follow up as an outpatient. Gastroenterology plans for ERCP on 08/16/2014. The patient continues on Unasyn for empiric treatment of aspiration pneumonitis and possible cholangitis.  Transferred to stepdown on 9/25 for increasing respiratory distress from pulmonary edema.  Diuresed well and was transferred back to floor where he has been evaluated by cardiology and cleared for surgery.    Assessment/Plan  Acute on chronic respiratory  failure,  Improving with diuresis.  Likely multifactorial from COPD, aspiration pneumonia, and acute diastolic heart failure, now back to home oxygen level of 2.5 to 3L.   -  ECHO:  Mild LVH, EF 50-55%, no regional wall motion abnl, grade 1 DD,  -  Hold lasix this morning  -  Wean oxygen to 2.5L -  Continue bronchodilators -  Will try to avoid systemic steroids due to underlying infection -  I/O not strictly recorded, but volume overload and breathing clinically improved -  To OR later today  Acute pancreatitis with CBD stone  --LFTs, lipase improved  --MRCP--distant, bowel duct filling defect suspicious for periampullary stone  - ERCP done on 08/16/14 shows Choledocholiathiasis, treated with biliary sphincterotomy and balloon sweeping. - CCS consulted:  cholecystectomy today  Klebsiella bacteremia, likely seeded from cholangitis/obstructing stone, sensitive to unasyn but not ampicillin -  D/c zosyn.  Patient will receive perioperative abx and that should be sufficient for his infection  Renal mass suspicious for RCC based on MRI appearance -Appreciate urology consult-->followup with Dr. Consuella Lose as outpatient   COPD, stable -  Continue nebulized LABA and ICS today - Continue albuterol and Atrovent  -  Completed abx  Hypertension, labile BPs -  Continue carvedilol and losartan  -  Continue bidil -  Continue prn hydralazine  Diabetes mellitus type 2, CBG well controlled - 08/12/2014 hemoglobin A1c 7.0 -  Cont to hold metformin -  Continue ISS  Hypokalemia due to diuresis -  Continue daily potassium repletion  Normocytic anemia due to chronic disease and acute illness -  Hemoglobin approximately stable near 10 mg/dl  Diet:  Diabetic diet Access:  PIV IVF:  off Proph:  SCDs  Code Status: full Family Communication: patient alone Disposition Plan:  To OR today   Consultants:  General surgery, Dr. Johney Maine  GI, Dr. Carlean Purl  Urology, Dr. Junious Silk  Procedures:  9/20  CXR:  NAD  9/19 CXR:  Scarring, possibly mild pneumonia or atelectasis, mild vascular congestion  9/19 CT abd/pelvis:  Focal midline retroperitoneal inflammation, unclear if this is reflects sequelae duodenitis or possible pancreatitis. Suspected mild proximal small bowel wall thickening. Nonobstructing subcentimeter distal Common bile duct lesion, which could reflect polyp or less likely neoplasm, possible sludge ball. Recommend MRCP on a nonemergent basis. Small hiatal hernia with moderate amount of associated mildly inflamed fat, which is possibly tracking from the retroperitoneum. 16 mm solid LEFT lower pole renal mass highly concerning for renal cell carcinoma  9/21 MRCP:   Filling defect within the distal common duct, highly suspicious for periampullary stone. This is positioned more distally than on 08/12/14 CT. Mild secondary biliary ductal dilatation. 3. 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 4. Progressive moderate pancreatitis  ERCP 08-16-14  The CBD was dilated diffusely up to 42mm and there was a round filling defect in distal bile duct. An adequate biliary sphincterotomy was performed over the biliary wire and then a biliary retrieval balloon was used to sweep the bile ducts. A single 9mm greenish stone was delivered into the duodenum. There was no purulence. A completion, occlusion cholangiogram showed no further filling defects. The main pancreatic duct was never cannulated or injected with dye  Antibiotics: Levofloxacin 9/19>>9/20  unasyn 08/13/14>>> 9/25 Zosyn 9/25 >> 9/29    HPI/Subjective:  Feeling well.  Has been ambulating around the halls.  Denies SOB, chest pain, nausea, vomiting, diarrhea.    Objective: Filed Vitals:   08/22/14 0522 08/22/14 0700 08/22/14 0924 08/22/14 1023  BP: 137/62 159/65  128/76  Pulse: 65 78  72  Temp: 99.1 F (37.3 C)     TempSrc: Oral     Resp: 18   20  Height:      Weight:       SpO2: 96% 94% 95% 100%    Intake/Output Summary (Last 24 hours) at 08/22/14 1052 Last data filed at 08/22/14 0907  Gross per 24 hour  Intake 1088.67 ml  Output   3000 ml  Net -1911.33 ml   Filed Weights   08/19/14 0500 08/20/14 0515 08/21/14 0457  Weight: 82.8 kg (182 lb 8.7 oz) 81.602 kg (179 lb 14.4 oz) 80.015 kg (176 lb 6.4 oz)    Exam:   General:  WM, No acute distress, 3L Spray  HEENT:  NCAT, MMM  Cardiovascular:  RRR, nl S1, S2 no mrg, 2+ pulses, warm extremities  Respiratory:   CTAB, no increased WOB  Abdomen:   NABS, soft, ND, NT  MSK:   Normal tone and bulk, very minimal trace bilateral LEE   Neuro:  Grossly intact  Data Reviewed: Basic Metabolic Panel:  Recent Labs Lab 08/18/14 0435 08/19/14 0500 08/20/14 0437 08/21/14 0434 08/22/14 0405  NA 138 136* 136* 136* 135*  K 3.7 3.0* 3.3* 3.5* 3.6*  CL 104 99 98 98 97  CO2 21 25 24 24 25   GLUCOSE 161* 132* 123* 126* 122*  BUN 13 13 12 12 11   CREATININE 0.74 0.82 0.90 0.94 0.93  CALCIUM 8.4 8.0* 8.1* 8.1* 8.2*  MG  --  1.9  --   --   --    Liver Function Tests:  Recent Labs Lab  08/18/14 0435 08/19/14 0500 08/20/14 0437 08/21/14 0434 08/22/14 0405  AST 23 19 16 15 17   ALT 21 17 15 14 14   ALKPHOS 130* 100 91 87 86  BILITOT 1.0 1.2 1.1 1.0 0.9  PROT 6.2 5.7* 5.8* 6.2 6.0  ALBUMIN 2.4* 2.2* 2.3* 2.4* 2.6*    Recent Labs Lab 08/16/14 0335 08/17/14 0507 08/18/14 0435 08/19/14 0500  LIPASE 60* 65* 91* 74*   No results found for this basename: AMMONIA,  in the last 168 hours CBC:  Recent Labs Lab 08/18/14 0435 08/19/14 0500 08/20/14 0437 08/21/14 0434 08/22/14 0405  WBC 14.3* 9.1 10.5 12.1* 11.2*  HGB 12.3* 10.8* 10.6* 10.2* 10.4*  HCT 35.8* 30.3* 30.4* 29.8* 30.7*  MCV 93.0 90.7 91.3 93.1 92.5  PLT 187 195 244 237 289   Cardiac Enzymes: No results found for this basename: CKTOTAL, CKMB, CKMBINDEX, TROPONINI,  in the last 168 hours BNP (last 3 results)  Recent Labs   08/12/14 0408  PROBNP 772.2*   CBG:  Recent Labs Lab 08/21/14 1217 08/21/14 1652 08/21/14 2119 08/22/14 0332 08/22/14 0754  GLUCAP 145* 186* 126* 124* 137*    Recent Results (from the past 240 hour(s))  CULTURE, EXPECTORATED SPUTUM-ASSESSMENT     Status: None   Collection Time    08/13/14  1:56 AM      Result Value Ref Range Status   Specimen Description SPUTUM   Final   Special Requests NONE   Final   Sputum evaluation     Final   Value: MICROSCOPIC FINDINGS SUGGEST THAT THIS SPECIMEN IS NOT REPRESENTATIVE OF LOWER RESPIRATORY SECRETIONS. PLEASE RECOLLECT.     Enid Derry AT 0216 ON 09.20.2015 BY NBROOKS   Report Status 08/13/2014 FINAL   Final  CULTURE, EXPECTORATED SPUTUM-ASSESSMENT     Status: None   Collection Time    08/13/14  6:47 AM      Result Value Ref Range Status   Specimen Description SPUTUM   Final   Special Requests NONE   Final   Sputum evaluation     Final   Value: THIS SPECIMEN IS ACCEPTABLE. RESPIRATORY CULTURE REPORT TO FOLLOW.   Report Status 08/13/2014 FINAL   Final  CULTURE, RESPIRATORY (NON-EXPECTORATED)     Status: None   Collection Time    08/13/14  6:47 AM      Result Value Ref Range Status   Specimen Description SPUTUM   Final   Special Requests NONE   Final   Gram Stain     Final   Value: FEW WBC PRESENT, PREDOMINANTLY MONONUCLEAR     RARE SQUAMOUS EPITHELIAL CELLS PRESENT     RARE GRAM POSITIVE COCCI     IN PAIRS IN CLUSTERS RARE GRAM NEGATIVE RODS     Performed at [facility]   Culture     Final   Value: NORMAL OROPHARYNGEAL FLORA     Performed at [facility]   Report Status 08/15/2014 FINAL   Final  URINE CULTURE     Status: None   Collection Time    08/13/14  1:58 PM      Result Value Ref Range Status   Specimen Description URINE, CATHETERIZED   Final   Special Requests NONE   Final   Culture  Setup Time     Final   Value: 08/13/2014 19:11     Performed at [place]     Final    Value: NO GROWTH  Performed at [facility]     Final   Value: NO GROWTH     Performed at [facility]   Report Status 08/14/2014 FINAL   Final  SURGICAL PCR SCREEN     Status: Abnormal   Collection Time    08/18/14  1:05 AM      Result Value Ref Range Status   MRSA, PCR INVALID RESULTS, SPECIMEN SENT FOR CULTURE (*) NEGATIVE Final   Comment: RESULT CALLED TO, READ BACK BY AND VERIFIED WITH:     V.STEWART,RN AT 0303 ON 08/18/14 BY SHEAW   Staphylococcus aureus INVALID RESULTS, SPECIMEN SENT FOR CULTURE (*) NEGATIVE Final   Comment: RESULT CALLED TO, READ BACK BY AND VERIFIED WITH:     V.STEWART,RN AT 0303 ON 08/18/14 BY SHEAW                The Xpert SA Assay (FDA     approved for NASAL specimens     in patients over 82 years of age),     is one component of     a comprehensive surveillance     program.  Test performance has     been validated by [employer] for patients greater     than or equal to 4 year old.     It is not intended     to diagnose infection nor to     guide or monitor treatment.  MRSA CULTURE     Status: None   Collection Time    08/18/14  1:05 AM      Result Value Ref Range Status   Specimen Description NOSE   Final   Special Requests NONE   Final   Culture     Final   Value: NO STAPHYLOCOCCUS AUREUS ISOLATED     Note: No MRSA Isolated     Performed at [facility]   Report Status 08/20/2014 FINAL   Final  CULTURE, BLOOD (ROUTINE X 2)     Status: None   Collection Time    08/18/14  8:31 AM      Result Value Ref Range Status   Specimen Description BLOOD LEFT HAND   Final   Special Requests BOTTLES DRAWN AEROBIC AND ANAEROBIC 10CC   Final   Culture  Setup Time     Final   Value: 08/18/2014 11:06     Performed at [facility]   Culture     Final   Value:        BLOOD CULTURE RECEIVED NO GROWTH TO DATE CULTURE WILL BE HELD FOR 5 DAYS BEFORE ISSUING A FINAL NEGATIVE REPORT     Performed at [facility]   Report Status PENDING   Incomplete  CULTURE, BLOOD (ROUTINE X 2)     Status: None   Collection Time    08/18/14  8:38 AM      Result Value Ref Range Status   Specimen Description BLOOD RIGHT ARM   Final   Special Requests BOTTLES DRAWN AEROBIC AND ANAEROBIC 10CC   Final   Culture  Setup Time     Final   Value: 08/18/2014 11:06     Performed at [facility]   Culture     Final   Value:        BLOOD CULTURE RECEIVED NO GROWTH TO DATE CULTURE WILL BE HELD FOR 5 DAYS BEFORE ISSUING A FINAL NEGATIVE REPORT  Performed at [facility]   Report Status PENDING   Incomplete     Studies: Dg Chest 2 View  08/21/2014   CLINICAL DATA:  Cough and shortness of breath.  EXAM: CHEST  2 VIEW  COMPARISON:  08/19/2012.  FINDINGS: Improved interstitial edema. Persistent consolidation at the LEFT base with LEFT effusion. Stable linear scarring RIGHT upper lobe. Stable cardiomediastinal silhouette with calcified tortuous aorta.  IMPRESSION: Overall slight improvement aeration.   Electronically Signed   By: Rolla Flatten M.D.   On: 08/21/2014 08:18    Scheduled Meds: . antiseptic oral rinse  7 mL Mouth Rinse q12n4p  . arformoterol  15 mcg Nebulization BID  . aspirin EC  81 mg Oral Daily  . atorvastatin  20 mg Oral q1800  . budesonide (PULMICORT) nebulizer solution  0.5 mg Nebulization BID  . carvedilol  25 mg Oral BID WC  . chlorhexidine  15 mL Mouth Rinse BID  . furosemide  40 mg Intravenous TID PC  . guaiFENesin  600 mg Oral Daily  . insulin aspart  0-9 Units Subcutaneous 6 times per day  . ipratropium-albuterol  3 mL Nebulization TID  . isosorbide-hydrALAZINE  1 tablet Oral TID  . lip balm  1 application Topical BID  . losartan  50 mg Oral Daily  . piperacillin-tazobactam (ZOSYN)  IV  3.375 g Intravenous 3 times per day  . potassium chloride  40 mEq Oral Daily  . saccharomyces boulardii  250 mg Oral BID  . sodium chloride  3 mL Intravenous Q12H   Continuous  Infusions: . sodium chloride 20 mL/hr at 08/22/14 0416    Principal Problem:   Acute respiratory failure with hypoxia Active Problems:   HYPERLIPIDEMIA   HYPERTENSION   COPD (chronic obstructive pulmonary disease)   Acute pancreatitis   CAP (community acquired pneumonia)   Leukocytosis   Abnormal LFTs   Renal mass   Type II or unspecified type diabetes mellitus without mention of complication, not stated as uncontrolled   Nonspecific (abnormal) findings on radiological and other examination of biliary tract   Acute-on-chronic respiratory failure   Aspiration pneumonitis   Choledocholithiasis   Acute on chronic diastolic heart failure   Preoperative clearance    Time spent: 30 min    Kaicee Scarpino, [facility] Pager [phone]. If 7PM-7AM, please contact night-coverage at [email or web address], password [facility] 08/22/2014, 10:52 AM  LOS: 10 days

## 2014-08-22 NOTE — Anesthesia Preprocedure Evaluation (Addendum)
Anesthesia Evaluation  Patient identified by MRN, date of birth, ID band Patient awake    Reviewed: Allergy & Precautions, H&P , NPO status , Patient's Chart, lab work & pertinent test results  History of Anesthesia Complications Negative for: history of anesthetic complications  Airway Mallampati: II TM Distance: >3 FB Neck ROM: Full    Dental no notable dental hx.    Pulmonary pneumonia -, resolved, COPD COPD inhaler and oxygen dependent, former smoker,  Last saw pulmonologist for routine follow up 08/02/14 with no acute exacerbations and at his routine baseline function breath sounds clear to auscultation  Pulmonary exam normal       Cardiovascular hypertension, Pt. on medications and Pt. on home beta blockers Rhythm:Regular Rate:Normal     Neuro/Psych negative neurological ROS  negative psych ROS   GI/Hepatic negative GI ROS, Neg liver ROS,   Endo/Other  diabetes  Renal/GU negative Renal ROS  negative genitourinary   Musculoskeletal negative musculoskeletal ROS (+)   Abdominal   Peds negative pediatric ROS (+)  Hematology negative hematology ROS (+)   Anesthesia Other Findings   Reproductive/Obstetrics negative OB ROS                          Anesthesia Physical Anesthesia Plan  ASA: III  Anesthesia Plan: General   Post-op Pain Management:    Induction: Intravenous  Airway Management Planned: Oral ETT  Additional Equipment:   Intra-op Plan:   Post-operative Plan: Extubation in OR and Possible Post-op intubation/ventilation  Informed Consent: I have reviewed the patients History and Physical, chart, labs and discussed the procedure including the risks, benefits and alternatives for the proposed anesthesia with the patient or authorized representative who has indicated his/her understanding and acceptance.   Dental advisory given  Plan Discussed with: CRNA  Anesthesia Plan  Comments:         Anesthesia Quick Evaluation

## 2014-08-22 NOTE — Progress Notes (Signed)
Subjective:  No CP/SOB or abd pain  Objective:  Temp:  [98.6 F (37 C)-99.1 F (37.3 C)] 99.1 F (37.3 C) (09/29 0522) Pulse Rate:  [65-81] 78 (09/29 0700) Resp:  [18-20] 18 (09/29 0522) BP: (118-159)/(50-86) 159/65 mmHg (09/29 0700) SpO2:  [94 %-99 %] 94 % (09/29 0700) Weight change:   Intake/Output from previous day: 09/28 0701 - 09/29 0700 In: 1848.7 [P.O.:1440; I.V.:358.7; IV Piggyback:50] Out: 3000 [Urine:3000]  Intake/Output from this shift:    Physical Exam: General appearance: alert and no distress Neck: no adenopathy, no carotid bruit, no JVD, supple, symmetrical, trachea midline and thyroid not enlarged, symmetric, no tenderness/mass/nodules Lungs: clear to auscultation bilaterally Heart: regular rate and rhythm, S1, S2 normal, no murmur, click, rub or gallop Extremities: extremities normal, atraumatic, no cyanosis or edema  Lab Results: Results for orders placed during the hospital encounter of 08/12/14 (from the past 48 hour(s))  GLUCOSE, CAPILLARY     Status: Abnormal   Collection Time    08/20/14 12:33 PM      Result Value Ref Range   Glucose-Capillary 191 (*) 70 - 99 mg/dL  GLUCOSE, CAPILLARY     Status: Abnormal   Collection Time    08/20/14  5:21 PM      Result Value Ref Range   Glucose-Capillary 137 (*) 70 - 99 mg/dL  GLUCOSE, CAPILLARY     Status: Abnormal   Collection Time    08/20/14  9:04 PM      Result Value Ref Range   Glucose-Capillary 155 (*) 70 - 99 mg/dL  CBC     Status: Abnormal   Collection Time    08/21/14  4:34 AM      Result Value Ref Range   WBC 12.1 (*) 4.0 - 10.5 K/uL   RBC 3.20 (*) 4.22 - 5.81 MIL/uL   Hemoglobin 10.2 (*) 13.0 - 17.0 g/dL   HCT 29.8 (*) 39.0 - 52.0 %   MCV 93.1  78.0 - 100.0 fL   MCH 31.9  26.0 - 34.0 pg   MCHC 34.2  30.0 - 36.0 g/dL   RDW 13.1  11.5 - 15.5 %   Platelets 237  150 - 400 K/uL  COMPREHENSIVE METABOLIC PANEL     Status: Abnormal   Collection Time    08/21/14  4:34 AM      Result  Value Ref Range   Sodium 136 (*) 137 - 147 mEq/L   Potassium 3.5 (*) 3.7 - 5.3 mEq/L   Chloride 98  96 - 112 mEq/L   CO2 24  19 - 32 mEq/L   Glucose, Bld 126 (*) 70 - 99 mg/dL   BUN 12  6 - 23 mg/dL   Creatinine, Ser 0.94  0.50 - 1.35 mg/dL   Calcium 8.1 (*) 8.4 - 10.5 mg/dL   Total Protein 6.2  6.0 - 8.3 g/dL   Albumin 2.4 (*) 3.5 - 5.2 g/dL   AST 15  0 - 37 U/L   ALT 14  0 - 53 U/L   Alkaline Phosphatase 87  39 - 117 U/L   Total Bilirubin 1.0  0.3 - 1.2 mg/dL   GFR calc non Af Amer 77 (*) >90 mL/min   GFR calc Af Amer 89 (*) >90 mL/min   Comment: (NOTE)     The eGFR has been calculated using the CKD EPI equation.     This calculation has not been validated in all clinical situations.     eGFR's  persistently <90 mL/min signify possible Chronic Kidney     Disease.   Anion gap 14  5 - 15  GLUCOSE, CAPILLARY     Status: Abnormal   Collection Time    08/21/14  7:08 AM      Result Value Ref Range   Glucose-Capillary 129 (*) 70 - 99 mg/dL  GLUCOSE, CAPILLARY     Status: Abnormal   Collection Time    08/21/14 12:17 PM      Result Value Ref Range   Glucose-Capillary 145 (*) 70 - 99 mg/dL  GLUCOSE, CAPILLARY     Status: Abnormal   Collection Time    08/21/14  4:52 PM      Result Value Ref Range   Glucose-Capillary 186 (*) 70 - 99 mg/dL  GLUCOSE, CAPILLARY     Status: Abnormal   Collection Time    08/21/14  9:19 PM      Result Value Ref Range   Glucose-Capillary 126 (*) 70 - 99 mg/dL  GLUCOSE, CAPILLARY     Status: Abnormal   Collection Time    08/22/14  3:32 AM      Result Value Ref Range   Glucose-Capillary 124 (*) 70 - 99 mg/dL  CBC     Status: Abnormal   Collection Time    08/22/14  4:05 AM      Result Value Ref Range   WBC 11.2 (*) 4.0 - 10.5 K/uL   RBC 3.32 (*) 4.22 - 5.81 MIL/uL   Hemoglobin 10.4 (*) 13.0 - 17.0 g/dL   HCT 30.7 (*) 39.0 - 52.0 %   MCV 92.5  78.0 - 100.0 fL   MCH 31.3  26.0 - 34.0 pg   MCHC 33.9  30.0 - 36.0 g/dL   RDW 13.0  11.5 - 15.5 %    Platelets 289  150 - 400 K/uL  COMPREHENSIVE METABOLIC PANEL     Status: Abnormal   Collection Time    08/22/14  4:05 AM      Result Value Ref Range   Sodium 135 (*) 137 - 147 mEq/L   Potassium 3.6 (*) 3.7 - 5.3 mEq/L   Chloride 97  96 - 112 mEq/L   CO2 25  19 - 32 mEq/L   Glucose, Bld 122 (*) 70 - 99 mg/dL   BUN 11  6 - 23 mg/dL   Creatinine, Ser 0.93  0.50 - 1.35 mg/dL   Calcium 8.2 (*) 8.4 - 10.5 mg/dL   Total Protein 6.0  6.0 - 8.3 g/dL   Albumin 2.6 (*) 3.5 - 5.2 g/dL   AST 17  0 - 37 U/L   ALT 14  0 - 53 U/L   Alkaline Phosphatase 86  39 - 117 U/L   Total Bilirubin 0.9  0.3 - 1.2 mg/dL   GFR calc non Af Amer 77 (*) >90 mL/min   GFR calc Af Amer 90 (*) >90 mL/min   Comment: (NOTE)     The eGFR has been calculated using the CKD EPI equation.     This calculation has not been validated in all clinical situations.     eGFR's persistently <90 mL/min signify possible Chronic Kidney     Disease.   Anion gap 13  5 - 15  GLUCOSE, CAPILLARY     Status: Abnormal   Collection Time    08/22/14  7:54 AM      Result Value Ref Range   Glucose-Capillary 137 (*) 70 - 99 mg/dL  Imaging: Imaging results have been reviewed  Assessment/Plan:   1. Principal Problem: 2.   Acute respiratory failure with hypoxia 3. Active Problems: 4.   HYPERLIPIDEMIA 5.   HYPERTENSION 6.   COPD (chronic obstructive pulmonary disease) 7.   Acute pancreatitis 8.   CAP (community acquired pneumonia) 64.   Leukocytosis 10.   Abnormal LFTs 11.   Renal mass 12.   Type II or unspecified type diabetes mellitus without mention of complication, not stated as uncontrolled 13.   Nonspecific (abnormal) findings on radiological and other examination of biliary tract 14.   Acute-on-chronic respiratory failure 15.   Aspiration pneumonitis 16.   Choledocholithiasis 17.   Acute on chronic diastolic heart failure 18.   Preoperative clearance 19.   Time Spent Directly with Patient:  20 minutes  Length of  Stay:  LOS: 10 days   2D shows nl LV fxn with grade 1 DD. I/O neg 1 l yesterday ( still up 6 l) on lasix IV TID. For Abd surg today. Will follow post op.  Lorretta Harp 08/22/2014, 8:26 AM

## 2014-08-22 NOTE — Progress Notes (Signed)
Patient ID: Jim [name], male   DOB: 1932-03-02, 80 y.o.   MRN: [id number]     [facility]      [address] [place], [place] [address]    Phone: [phone] FAX: [phone]     Subjective: No issues overnight.  VSS.  Afebrile.  Denies sob, cp or palpitations.   Objective:  Vital signs:  Filed Vitals:   08/21/14 1827 08/21/14 2121 08/22/14 0522 08/22/14 0700  BP: 124/50 124/63 137/62 159/65  Pulse: 81 78 65 78  Temp:  98.6 F (37 C) 99.1 F (37.3 C)   TempSrc:  Oral Oral   Resp:  18 18   Height:      Weight:      SpO2:  94% 96% 94%    Last BM Date: 08/21/14  Intake/Output   Yesterday:  09/28 0701 - 09/29 0700 In: 1848.7 [P.O.:1440; I.V.:358.7; IV Piggyback:50] Out: 3000 [Urine:3000] This shift:    I/O last 3 completed shifts: In: 1948.7 [P.O.:1440; I.V.:358.7; IV Piggyback:150] Out: 3875 [Urine:3875]    Physical Exam: General: Pt awake/alert/oriented x4 in no acute distress Chest: cta. No chest wall pain w good excursion.  Good IS use ~1247m CV:  Pulses intact.  Regular rhythm Abdomen: Soft.  Nondistended.  Non tender.  No evidence of peritonitis.  No incarcerated hernias. Ext:  SCDs BLE.  No mjr edema.  No cyanosis Skin: No petechiae / purpura   Problem List:   Principal Problem:   Acute respiratory failure with hypoxia Active Problems:   HYPERLIPIDEMIA   HYPERTENSION   COPD (chronic obstructive pulmonary disease)   Acute pancreatitis   CAP (community acquired pneumonia)   Leukocytosis   Abnormal LFTs   Renal mass   Type II or unspecified type diabetes mellitus without mention of complication, not stated as uncontrolled   Nonspecific (abnormal) findings on radiological and other examination of biliary tract   Acute-on-chronic respiratory failure   Aspiration pneumonitis   Choledocholithiasis   Acute on chronic diastolic heart failure   Preoperative clearance    Results:    Labs: Results for orders placed during the hospital encounter of 08/12/14 (from the past 48 hour(s))  GLUCOSE, CAPILLARY     Status: Abnormal   Collection Time    08/20/14 12:33 PM      Result Value Ref Range   Glucose-Capillary 191 (*) 70 - 99 mg/dL  GLUCOSE, CAPILLARY     Status: Abnormal   Collection Time    08/20/14  5:21 PM      Result Value Ref Range   Glucose-Capillary 137 (*) 70 - 99 mg/dL  GLUCOSE, CAPILLARY     Status: Abnormal   Collection Time    08/20/14  9:04 PM      Result Value Ref Range   Glucose-Capillary 155 (*) 70 - 99 mg/dL  CBC     Status: Abnormal   Collection Time    08/21/14  4:34 AM      Result Value Ref Range   WBC 12.1 (*) 4.0 - 10.5 K/uL   RBC 3.20 (*) 4.22 - 5.81 MIL/uL   Hemoglobin 10.2 (*) 13.0 - 17.0 g/dL   HCT 29.8 (*) 39.0 - 52.0 %   MCV 93.1  78.0 - 100.0 fL   MCH 31.9  26.0 - 34.0 pg   MCHC 34.2  30.0 - 36.0 g/dL   RDW 13.1  11.5 - 15.5 %   Platelets 237  150 - 400 K/uL  COMPREHENSIVE METABOLIC PANEL     Status: Abnormal   Collection Time    08/21/14  4:34 AM      Result Value Ref Range   Sodium 136 (*) 137 - 147 mEq/L   Potassium 3.5 (*) 3.7 - 5.3 mEq/L   Chloride 98  96 - 112 mEq/L   CO2 24  19 - 32 mEq/L   Glucose, Bld 126 (*) 70 - 99 mg/dL   BUN 12  6 - 23 mg/dL   Creatinine, Ser 0.94  0.50 - 1.35 mg/dL   Calcium 8.1 (*) 8.4 - 10.5 mg/dL   Total Protein 6.2  6.0 - 8.3 g/dL   Albumin 2.4 (*) 3.5 - 5.2 g/dL   AST 15  0 - 37 U/L   ALT 14  0 - 53 U/L   Alkaline Phosphatase 87  39 - 117 U/L   Total Bilirubin 1.0  0.3 - 1.2 mg/dL   GFR calc non Af Amer 77 (*) >90 mL/min   GFR calc Af Amer 89 (*) >90 mL/min   Comment: (NOTE)     The eGFR has been calculated using the CKD EPI equation.     This calculation has not been validated in all clinical situations.     eGFR's persistently <90 mL/min signify possible Chronic Kidney     Disease.   Anion gap 14  5 - 15  GLUCOSE, CAPILLARY     Status: Abnormal   Collection Time     08/21/14  7:08 AM      Result Value Ref Range   Glucose-Capillary 129 (*) 70 - 99 mg/dL  GLUCOSE, CAPILLARY     Status: Abnormal   Collection Time    08/21/14 12:17 PM      Result Value Ref Range   Glucose-Capillary 145 (*) 70 - 99 mg/dL  GLUCOSE, CAPILLARY     Status: Abnormal   Collection Time    08/21/14  4:52 PM      Result Value Ref Range   Glucose-Capillary 186 (*) 70 - 99 mg/dL  GLUCOSE, CAPILLARY     Status: Abnormal   Collection Time    08/21/14  9:19 PM      Result Value Ref Range   Glucose-Capillary 126 (*) 70 - 99 mg/dL  GLUCOSE, CAPILLARY     Status: Abnormal   Collection Time    08/22/14  3:32 AM      Result Value Ref Range   Glucose-Capillary 124 (*) 70 - 99 mg/dL  CBC     Status: Abnormal   Collection Time    08/22/14  4:05 AM      Result Value Ref Range   WBC 11.2 (*) 4.0 - 10.5 K/uL   RBC 3.32 (*) 4.22 - 5.81 MIL/uL   Hemoglobin 10.4 (*) 13.0 - 17.0 g/dL   HCT 30.7 (*) 39.0 - 52.0 %   MCV 92.5  78.0 - 100.0 fL   MCH 31.3  26.0 - 34.0 pg   MCHC 33.9  30.0 - 36.0 g/dL   RDW 13.0  11.5 - 15.5 %   Platelets 289  150 - 400 K/uL  COMPREHENSIVE METABOLIC PANEL     Status: Abnormal   Collection Time    08/22/14  4:05 AM      Result Value Ref Range   Sodium 135 (*) 137 - 147 mEq/L   Potassium 3.6 (*) 3.7 - 5.3 mEq/L   Chloride 97  96 - 112 mEq/L   CO2 25  19 - 32 mEq/L   Glucose, Bld 122 (*) 70 - 99 mg/dL   BUN 11  6 - 23 mg/dL   Creatinine, Ser 0.93  0.50 - 1.35 mg/dL   Calcium 8.2 (*) 8.4 - 10.5 mg/dL   Total Protein 6.0  6.0 - 8.3 g/dL   Albumin 2.6 (*) 3.5 - 5.2 g/dL   AST 17  0 - 37 U/L   ALT 14  0 - 53 U/L   Alkaline Phosphatase 86  39 - 117 U/L   Total Bilirubin 0.9  0.3 - 1.2 mg/dL   GFR calc non Af Amer 77 (*) >90 mL/min   GFR calc Af Amer 90 (*) >90 mL/min   Comment: (NOTE)     The eGFR has been calculated using the CKD EPI equation.     This calculation has not been validated in all clinical situations.     eGFR's persistently <90 mL/min  signify possible Chronic Kidney     Disease.   Anion gap 13  5 - 15  GLUCOSE, CAPILLARY     Status: Abnormal   Collection Time    08/22/14  7:54 AM      Result Value Ref Range   Glucose-Capillary 137 (*) 70 - 99 mg/dL    Imaging / Studies: Dg Chest 2 View  08/21/2014   CLINICAL DATA:  Cough and shortness of breath.  EXAM: CHEST  2 VIEW  COMPARISON:  08/19/2012.  FINDINGS: Improved interstitial edema. Persistent consolidation at the LEFT base with LEFT effusion. Stable linear scarring RIGHT upper lobe. Stable cardiomediastinal silhouette with calcified tortuous aorta.  IMPRESSION: Overall slight improvement aeration.   Electronically Signed   By: Rolla Flatten M.D.   On: 08/21/2014 08:18    Medications / Allergies:  Scheduled Meds: . antiseptic oral rinse  7 mL Mouth Rinse q12n4p  . arformoterol  15 mcg Nebulization BID  . aspirin EC  81 mg Oral Daily  . atorvastatin  20 mg Oral q1800  . budesonide (PULMICORT) nebulizer solution  0.5 mg Nebulization BID  . carvedilol  25 mg Oral BID WC  . chlorhexidine  15 mL Mouth Rinse BID  . furosemide  40 mg Intravenous TID PC  . guaiFENesin  600 mg Oral Daily  . insulin aspart  0-9 Units Subcutaneous 6 times per day  . ipratropium-albuterol  3 mL Nebulization TID  . isosorbide-hydrALAZINE  1 tablet Oral TID  . lip balm  1 application Topical BID  . losartan  50 mg Oral Daily  . piperacillin-tazobactam (ZOSYN)  IV  3.375 g Intravenous 3 times per day  . potassium chloride  40 mEq Oral Daily  . saccharomyces boulardii  250 mg Oral BID  . sodium chloride  3 mL Intravenous Q12H   Continuous Infusions: . sodium chloride 20 mL/hr at 08/22/14 0416   PRN Meds:.acetaminophen, acetaminophen, albuterol, alum & mag hydroxide-simeth, bisacodyl, hydrALAZINE, magic mouthwash, menthol-cetylpyridinium, morphine injection, ondansetron (ZOFRAN) IV, phenol, polyethylene glycol, promethazine  Antibiotics: Anti-infectives   Start     Dose/Rate Route Frequency  Ordered Stop   08/18/14 0930  piperacillin-tazobactam (ZOSYN) IVPB 3.375 g     3.375 g 12.5 mL/hr over 240 Minutes Intravenous 3 times per day 08/18/14 0915     08/18/14 0600  ampicillin-sulbactam (UNASYN) 1.5 g in sodium chloride 0.9 % 50 mL IVPB  Status:  Discontinued     1.5 g 100 mL/hr over 30 Minutes Intravenous On call to O.R. 08/17/14 1841 08/18/14 0800   08/13/14  1200  ampicillin-sulbactam (UNASYN) 1.5 g in sodium chloride 0.9 % 50 mL IVPB  Status:  Discontinued     1.5 g 100 mL/hr over 30 Minutes Intravenous 4 times per day 08/13/14 1104 08/18/14 0800   08/12/14 0800  levofloxacin (LEVAQUIN) IVPB 750 mg  Status:  Discontinued     750 mg 100 mL/hr over 90 Minutes Intravenous Every 24 hours 08/12/14 0746 08/13/14 1104        Assessment/Plan Acute on chronic respiratory failure COPD Acute pancreatitis Choledocholithiasis s/p ERCP Klebsiella bacteremia DM Acute on chronic heart failure  To OR today at 3:15PM, NPO, consent signed, on antibiotics.   Erby Pian, [facility] Pager [phone]([id number]:30P)   08/22/2014 8:57 AM

## 2014-08-22 NOTE — Anesthesia Procedure Notes (Signed)
Procedure Name: Intubation Date/Time: 08/22/2014 12:34 PM Performed by: Ofilia Neas Pre-anesthesia Checklist: Patient identified, Emergency Drugs available, Suction available, Patient being monitored and Timeout performed Patient Re-evaluated:Patient Re-evaluated prior to inductionOxygen Delivery Method: Circle system utilized Preoxygenation: Pre-oxygenation with 100% oxygen Intubation Type: IV induction and Cricoid Pressure applied Laryngoscope Size: Mac and 4 Grade View: Grade II Tube type: Subglottic suction tube Tube size: 7.5 mm Number of attempts: 1 Airway Equipment and Method: Stylet Placement Confirmation: ETT inserted through vocal cords under direct vision,  positive ETCO2 and breath sounds checked- equal and bilateral Secured at: 21 cm Tube secured with: Tape Dental Injury: Teeth and Oropharynx as per pre-operative assessment

## 2014-08-22 NOTE — H&P (View-Only) (Signed)
Patient ID: Jim [name], male   DOB: 1932-03-02, 80 y.o.   MRN: [id number]     [facility]      [address] [place], [place] [address]    Phone: [phone] FAX: [phone]     Subjective: No issues overnight.  VSS.  Afebrile.  Denies sob, cp or palpitations.   Objective:  Vital signs:  Filed Vitals:   08/21/14 1827 08/21/14 2121 08/22/14 0522 08/22/14 0700  BP: 124/50 124/63 137/62 159/65  Pulse: 81 78 65 78  Temp:  98.6 F (37 C) 99.1 F (37.3 C)   TempSrc:  Oral Oral   Resp:  18 18   Height:      Weight:      SpO2:  94% 96% 94%    Last BM Date: 08/21/14  Intake/Output   Yesterday:  09/28 0701 - 09/29 0700 In: 1848.7 [P.O.:1440; I.V.:358.7; IV Piggyback:50] Out: 3000 [Urine:3000] This shift:    I/O last 3 completed shifts: In: 1948.7 [P.O.:1440; I.V.:358.7; IV Piggyback:150] Out: 3875 [Urine:3875]    Physical Exam: General: Pt awake/alert/oriented x4 in no acute distress Chest: cta. No chest wall pain w good excursion.  Good IS use ~1247m CV:  Pulses intact.  Regular rhythm Abdomen: Soft.  Nondistended.  Non tender.  No evidence of peritonitis.  No incarcerated hernias. Ext:  SCDs BLE.  No mjr edema.  No cyanosis Skin: No petechiae / purpura   Problem List:   Principal Problem:   Acute respiratory failure with hypoxia Active Problems:   HYPERLIPIDEMIA   HYPERTENSION   COPD (chronic obstructive pulmonary disease)   Acute pancreatitis   CAP (community acquired pneumonia)   Leukocytosis   Abnormal LFTs   Renal mass   Type II or unspecified type diabetes mellitus without mention of complication, not stated as uncontrolled   Nonspecific (abnormal) findings on radiological and other examination of biliary tract   Acute-on-chronic respiratory failure   Aspiration pneumonitis   Choledocholithiasis   Acute on chronic diastolic heart failure   Preoperative clearance    Results:    Labs: Results for orders placed during the hospital encounter of 08/12/14 (from the past 48 hour(s))  GLUCOSE, CAPILLARY     Status: Abnormal   Collection Time    08/20/14 12:33 PM      Result Value Ref Range   Glucose-Capillary 191 (*) 70 - 99 mg/dL  GLUCOSE, CAPILLARY     Status: Abnormal   Collection Time    08/20/14  5:21 PM      Result Value Ref Range   Glucose-Capillary 137 (*) 70 - 99 mg/dL  GLUCOSE, CAPILLARY     Status: Abnormal   Collection Time    08/20/14  9:04 PM      Result Value Ref Range   Glucose-Capillary 155 (*) 70 - 99 mg/dL  CBC     Status: Abnormal   Collection Time    08/21/14  4:34 AM      Result Value Ref Range   WBC 12.1 (*) 4.0 - 10.5 K/uL   RBC 3.20 (*) 4.22 - 5.81 MIL/uL   Hemoglobin 10.2 (*) 13.0 - 17.0 g/dL   HCT 29.8 (*) 39.0 - 52.0 %   MCV 93.1  78.0 - 100.0 fL   MCH 31.9  26.0 - 34.0 pg   MCHC 34.2  30.0 - 36.0 g/dL   RDW 13.1  11.5 - 15.5 %   Platelets 237  150 - 400 K/uL  COMPREHENSIVE METABOLIC PANEL     Status: Abnormal   Collection Time    08/21/14  4:34 AM      Result Value Ref Range   Sodium 136 (*) 137 - 147 mEq/L   Potassium 3.5 (*) 3.7 - 5.3 mEq/L   Chloride 98  96 - 112 mEq/L   CO2 24  19 - 32 mEq/L   Glucose, Bld 126 (*) 70 - 99 mg/dL   BUN 12  6 - 23 mg/dL   Creatinine, Ser 0.94  0.50 - 1.35 mg/dL   Calcium 8.1 (*) 8.4 - 10.5 mg/dL   Total Protein 6.2  6.0 - 8.3 g/dL   Albumin 2.4 (*) 3.5 - 5.2 g/dL   AST 15  0 - 37 U/L   ALT 14  0 - 53 U/L   Alkaline Phosphatase 87  39 - 117 U/L   Total Bilirubin 1.0  0.3 - 1.2 mg/dL   GFR calc non Af Amer 77 (*) >90 mL/min   GFR calc Af Amer 89 (*) >90 mL/min   Comment: (NOTE)     The eGFR has been calculated using the CKD EPI equation.     This calculation has not been validated in all clinical situations.     eGFR's persistently <90 mL/min signify possible Chronic Kidney     Disease.   Anion gap 14  5 - 15  GLUCOSE, CAPILLARY     Status: Abnormal   Collection Time     08/21/14  7:08 AM      Result Value Ref Range   Glucose-Capillary 129 (*) 70 - 99 mg/dL  GLUCOSE, CAPILLARY     Status: Abnormal   Collection Time    08/21/14 12:17 PM      Result Value Ref Range   Glucose-Capillary 145 (*) 70 - 99 mg/dL  GLUCOSE, CAPILLARY     Status: Abnormal   Collection Time    08/21/14  4:52 PM      Result Value Ref Range   Glucose-Capillary 186 (*) 70 - 99 mg/dL  GLUCOSE, CAPILLARY     Status: Abnormal   Collection Time    08/21/14  9:19 PM      Result Value Ref Range   Glucose-Capillary 126 (*) 70 - 99 mg/dL  GLUCOSE, CAPILLARY     Status: Abnormal   Collection Time    08/22/14  3:32 AM      Result Value Ref Range   Glucose-Capillary 124 (*) 70 - 99 mg/dL  CBC     Status: Abnormal   Collection Time    08/22/14  4:05 AM      Result Value Ref Range   WBC 11.2 (*) 4.0 - 10.5 K/uL   RBC 3.32 (*) 4.22 - 5.81 MIL/uL   Hemoglobin 10.4 (*) 13.0 - 17.0 g/dL   HCT 30.7 (*) 39.0 - 52.0 %   MCV 92.5  78.0 - 100.0 fL   MCH 31.3  26.0 - 34.0 pg   MCHC 33.9  30.0 - 36.0 g/dL   RDW 13.0  11.5 - 15.5 %   Platelets 289  150 - 400 K/uL  COMPREHENSIVE METABOLIC PANEL     Status: Abnormal   Collection Time    08/22/14  4:05 AM      Result Value Ref Range   Sodium 135 (*) 137 - 147 mEq/L   Potassium 3.6 (*) 3.7 - 5.3 mEq/L   Chloride 97  96 - 112 mEq/L   CO2 25  19 - 32 mEq/L   Glucose, Bld 122 (*) 70 - 99 mg/dL   BUN 11  6 - 23 mg/dL   Creatinine, Ser 0.93  0.50 - 1.35 mg/dL   Calcium 8.2 (*) 8.4 - 10.5 mg/dL   Total Protein 6.0  6.0 - 8.3 g/dL   Albumin 2.6 (*) 3.5 - 5.2 g/dL   AST 17  0 - 37 U/L   ALT 14  0 - 53 U/L   Alkaline Phosphatase 86  39 - 117 U/L   Total Bilirubin 0.9  0.3 - 1.2 mg/dL   GFR calc non Af Amer 77 (*) >90 mL/min   GFR calc Af Amer 90 (*) >90 mL/min   Comment: (NOTE)     The eGFR has been calculated using the CKD EPI equation.     This calculation has not been validated in all clinical situations.     eGFR's persistently <90 mL/min  signify possible Chronic Kidney     Disease.   Anion gap 13  5 - 15  GLUCOSE, CAPILLARY     Status: Abnormal   Collection Time    08/22/14  7:54 AM      Result Value Ref Range   Glucose-Capillary 137 (*) 70 - 99 mg/dL    Imaging / Studies: Dg Chest 2 View  08/21/2014   CLINICAL DATA:  Cough and shortness of breath.  EXAM: CHEST  2 VIEW  COMPARISON:  08/19/2012.  FINDINGS: Improved interstitial edema. Persistent consolidation at the LEFT base with LEFT effusion. Stable linear scarring RIGHT upper lobe. Stable cardiomediastinal silhouette with calcified tortuous aorta.  IMPRESSION: Overall slight improvement aeration.   Electronically Signed   By: Rolla Flatten M.D.   On: 08/21/2014 08:18    Medications / Allergies:  Scheduled Meds: . antiseptic oral rinse  7 mL Mouth Rinse q12n4p  . arformoterol  15 mcg Nebulization BID  . aspirin EC  81 mg Oral Daily  . atorvastatin  20 mg Oral q1800  . budesonide (PULMICORT) nebulizer solution  0.5 mg Nebulization BID  . carvedilol  25 mg Oral BID WC  . chlorhexidine  15 mL Mouth Rinse BID  . furosemide  40 mg Intravenous TID PC  . guaiFENesin  600 mg Oral Daily  . insulin aspart  0-9 Units Subcutaneous 6 times per day  . ipratropium-albuterol  3 mL Nebulization TID  . isosorbide-hydrALAZINE  1 tablet Oral TID  . lip balm  1 application Topical BID  . losartan  50 mg Oral Daily  . piperacillin-tazobactam (ZOSYN)  IV  3.375 g Intravenous 3 times per day  . potassium chloride  40 mEq Oral Daily  . saccharomyces boulardii  250 mg Oral BID  . sodium chloride  3 mL Intravenous Q12H   Continuous Infusions: . sodium chloride 20 mL/hr at 08/22/14 0416   PRN Meds:.acetaminophen, acetaminophen, albuterol, alum & mag hydroxide-simeth, bisacodyl, hydrALAZINE, magic mouthwash, menthol-cetylpyridinium, morphine injection, ondansetron (ZOFRAN) IV, phenol, polyethylene glycol, promethazine  Antibiotics: Anti-infectives   Start     Dose/Rate Route Frequency  Ordered Stop   08/18/14 0930  piperacillin-tazobactam (ZOSYN) IVPB 3.375 g     3.375 g 12.5 mL/hr over 240 Minutes Intravenous 3 times per day 08/18/14 0915     08/18/14 0600  ampicillin-sulbactam (UNASYN) 1.5 g in sodium chloride 0.9 % 50 mL IVPB  Status:  Discontinued     1.5 g 100 mL/hr over 30 Minutes Intravenous On call to O.R. 08/17/14 1841 08/18/14 0800   08/13/14  1200  ampicillin-sulbactam (UNASYN) 1.5 g in sodium chloride 0.9 % 50 mL IVPB  Status:  Discontinued     1.5 g 100 mL/hr over 30 Minutes Intravenous 4 times per day 08/13/14 1104 08/18/14 0800   08/12/14 0800  levofloxacin (LEVAQUIN) IVPB 750 mg  Status:  Discontinued     750 mg 100 mL/hr over 90 Minutes Intravenous Every 24 hours 08/12/14 0746 08/13/14 1104        Assessment/Plan Acute on chronic respiratory failure COPD Acute pancreatitis Choledocholithiasis s/p ERCP Klebsiella bacteremia DM Acute on chronic heart failure  To OR today at 3:15PM, NPO, consent signed, on antibiotics.   Erby Pian, [facility] Pager [phone]([id number])   08/22/2014 8:57 AM

## 2014-08-22 NOTE — Op Note (Addendum)
Jim [name] [id number] [birth year] 08/22/2014  Laparoscopic Cholecystectomy with IOC Procedure Note  Indications: This patient presents with symptomatic gallbladder disease and will undergo laparoscopic cholecystectomy. Patient had presented with choledocholithiasis and underwent ERCP and sphincterotomy last week but then developed COPD excerebration delaying his surgery. He was seen and cleared for surgery by cardiology and hospital medicine. His lungs were back to baseline. He has been on systemic antibiotics for Klebsiella bacteremia.    Pre-operative Diagnosis: Biliary pancreatitis, symptomatic cholelithiasis  Post-operative Diagnosis: chronic cholecystitis with calculous, h/o biliary pancreatitis  Surgeon: Gayland Curry   Assistants: none  Anesthesia: General endotracheal anesthesia  ASA Class: 3  Procedure Details  The patient was seen again in the Holding Room. The risks, benefits, complications, treatment options, and expected outcomes were discussed with the patient. The possibilities of reaction to medication, pulmonary aspiration, perforation of viscus, bleeding, recurrent infection, finding a normal gallbladder, the need for additional procedures, failure to diagnose a condition, the possible need to convert to an open procedure, and creating a complication requiring transfusion or operation were discussed with the patient. The likelihood of improving the patient's symptoms with return to their baseline status is good.  The patient and/or family concurred with the proposed plan, giving informed consent. The site of surgery properly noted. The patient was taken to Operating Room, identified as Jim [name] and the procedure verified as Laparoscopic Cholecystectomy with Intraoperative Cholangiogram. A Time Out was held and the above information confirmed. Patient was on therapeutic Antibiotics.   General endotracheal anesthesia was then administered and tolerated well.  After the induction, the abdomen was prepped with Chloraprep and draped in the sterile fashion. The patient was positioned in the supine position.  Local anesthetic agent was injected into the skin near the umbilicus and an incision made. We dissected down to the abdominal fascia with blunt dissection.  The fascia was incised vertically and we entered the peritoneal cavity bluntly.  A pursestring suture of 0-Vicryl was placed around the fascial opening.  The Hasson cannula was inserted and secured with the stay suture.  Pneumoperitoneum was then created with CO2 and tolerated well without any adverse changes in the patient's vital signs. An 5-mm port was placed in the subxiphoid position.  Two 5-mm ports were placed in the right upper quadrant. All skin incisions were infiltrated with a local anesthetic agent before making the incision and placing the trocars.   We positioned the patient in reverse Trendelenburg, tilted slightly to the patient's left.  The gallbladder was identified, the fundus grasped and retracted cephalad. Adhesions were lysed bluntly and with the electrocautery where indicated, taking care not to injure any adjacent organs or viscus. The infundibulum was grasped and retracted laterally, exposing the peritoneum overlying the triangle of Calot. This was then divided and exposed in a blunt fashion. A critical view of the cystic duct and cystic artery was obtained.  The cystic duct was clearly identified and bluntly dissected circumferentially. The cystic duct was ligated with a clip distally.   An incision was made in the cystic duct and the [facility] cholangiogram catheter introduced. The catheter was secured using a clip. A cholangiogram was then obtained which showed good visualization of the distal and proximal biliary tree with no sign of filling defects or obstruction.  Contrast flowed into the duodenum. The catheter was then removed.   The cystic duct was then ligated with clips and  divided. The cystic artery which had been identified and dissected free was ligated with  clips and divided as well.   The gallbladder was dissected from the liver bed in retrograde fashion with the electrocautery. The gallbladder was removed and placed in an a bag.  The gallbladder and bag were then removed through the umbilical port site. The liver bed was irrigated and inspected. Hemostasis was achieved with the electrocautery. There was some additional bleeding from 1 spot on the left of gallbladder fossa. Additional cautery was used to obtain hemostasis which was achieved. But I decided to place of piece of [employer] SNoW. Copious irrigation was utilized and was repeatedly aspirated until clear.  The pursestring suture was used to close the umbilical fascia.  An additional interrupted 0-vicryl was placed at the umbilical closure.   We again inspected the right upper quadrant for hemostasis.  The umbilical closure was inspected and there was no air leak and nothing trapped within the closure. Pneumoperitoneum was released as we removed the trocars.  4-0 Monocryl was used to close the skin.   Dermabond was applied. The patient was then extubated and brought to the recovery room in stable condition. Instrument, sponge, and needle counts were correct at closure and at the conclusion of the case.   Findings: Cholelithiasis, +SNoW  Estimated Blood Loss: Minimal         Drains: none         Specimens: Gallbladder           Complications: None; patient tolerated the procedure well.         Disposition: PACU - hemodynamically stable.         Condition: stable  [name]. [name], MD, FACS General, Bariatric, & Minimally Invasive Surgery [facility], [place]

## 2014-08-23 ENCOUNTER — Encounter (HOSPITAL_COMMUNITY): Payer: Self-pay | Admitting: General Surgery

## 2014-08-23 ENCOUNTER — Other Ambulatory Visit: Payer: Self-pay

## 2014-08-23 DIAGNOSIS — R7989 Other specified abnormal findings of blood chemistry: Secondary | ICD-10-CM

## 2014-08-23 LAB — CBC
HCT: 33.7 % — ABNORMAL LOW (ref 39.0–52.0)
HEMOGLOBIN: 11.4 g/dL — AB (ref 13.0–17.0)
MCH: 31.6 pg (ref 26.0–34.0)
MCHC: 33.8 g/dL (ref 30.0–36.0)
MCV: 93.4 fL (ref 78.0–100.0)
Platelets: 346 10*3/uL (ref 150–400)
RBC: 3.61 MIL/uL — AB (ref 4.22–5.81)
RDW: 12.7 % (ref 11.5–15.5)
WBC: 11.1 10*3/uL — ABNORMAL HIGH (ref 4.0–10.5)

## 2014-08-23 LAB — GLUCOSE, CAPILLARY
GLUCOSE-CAPILLARY: 137 mg/dL — AB (ref 70–99)
GLUCOSE-CAPILLARY: 146 mg/dL — AB (ref 70–99)
Glucose-Capillary: 141 mg/dL — ABNORMAL HIGH (ref 70–99)
Glucose-Capillary: 230 mg/dL — ABNORMAL HIGH (ref 70–99)

## 2014-08-23 MED ORDER — CIPROFLOXACIN HCL 500 MG PO TABS: 500.0000 mg | ORAL_TABLET | Freq: Two times a day (BID) | ORAL | Status: AC

## 2014-08-23 MED ORDER — METRONIDAZOLE 500 MG PO TABS
500.0000 mg | ORAL_TABLET | Freq: Three times a day (TID) | ORAL | Status: DC
  Administered 2014-08-23: 500 mg via ORAL
  Filled 2014-08-23 (×3): qty 1

## 2014-08-23 MED ORDER — METRONIDAZOLE 500 MG PO TABS: 500.0000 mg | ORAL_TABLET | Freq: Three times a day (TID) | ORAL | Status: AC

## 2014-08-23 MED ORDER — BACID PO TABS
2.0000 | ORAL_TABLET | Freq: Three times a day (TID) | ORAL | Status: DC
  Administered 2014-08-23: 2 via ORAL
  Filled 2014-08-23 (×2): qty 2

## 2014-08-23 MED ORDER — BACID PO TABS: 2.0000 | ORAL_TABLET | Freq: Three times a day (TID) | ORAL | Status: AC

## 2014-08-23 MED ORDER — CIPROFLOXACIN HCL 500 MG PO TABS
500.0000 mg | ORAL_TABLET | Freq: Two times a day (BID) | ORAL | Status: DC
  Administered 2014-08-23: 500 mg via ORAL
  Filled 2014-08-23 (×2): qty 1

## 2014-08-23 MED ORDER — IPRATROPIUM-ALBUTEROL 0.5-2.5 (3) MG/3ML IN SOLN: 3.0000 mL | RESPIRATORY_TRACT | Status: DC | PRN

## 2014-08-23 NOTE — Discharge Instructions (Signed)
Follow with Primary MD [name], MD in 7 days   Get CBC, CMP, 2 view Chest X ray checked  by Primary MD next visit.  Please call your surgeon if any bleed or discharge come from a surgical site, or if have worsening abdominal pain.  Don't carry any heavy objects more than 15 pounds until you are seen and cleared by her surgeon as an outpatient.   Activity: As tolerated with Full fall precautions use walker/cane & assistance as needed   Disposition Home    Diet: Heart Healthy /Soft , with feeding assistance and aspiration precautions as needed.  For Heart failure patients - Check your Weight same time everyday, if you gain over 2 pounds, or you develop in leg swelling, experience more shortness of breath or chest pain, call your Primary MD immediately. Follow Cardiac Low Salt Diet and 1.8 lit/day fluid restriction.   On your next visit with her primary care physician please Get Medicines reviewed and adjusted.  Please request your Prim.MD to go over all Hospital Tests and Procedure/Radiological results at the follow up, please get all Hospital records sent to your Prim MD by signing hospital release before you go home.   If you experience worsening of your admission symptoms, develop shortness of breath, life threatening emergency, suicidal or homicidal thoughts you must seek medical attention immediately by calling 911 or calling your MD immediately  if symptoms less severe.  You Must read complete instructions/literature along with all the possible adverse reactions/side effects for all the Medicines you take and that have been prescribed to you. Take any new Medicines after you have completely understood and accpet all the possible adverse reactions/side effects.   Do not drive, operating heavy machinery, perform activities at heights, swimming or participation in water activities or provide baby sitting services if your were admitted for syncope or siezures until you have seen by  Primary MD or a Neurologist and advised to do so again.  Do not drive when taking Pain medications.    Do not take more than prescribed Pain, Sleep and Anxiety Medications  Special Instructions: If you have smoked or chewed Tobacco  in the last 2 yrs please stop smoking, stop any regular Alcohol  and or any Recreational drug use.  Wear Seat belts while driving.   Please note  You were cared for by a hospitalist during your hospital stay. If you have any questions about your discharge medications or the care you received while you were in the hospital after you are discharged, you can call the unit and asked to speak with the hospitalist on call if the hospitalist that took care of you is not available. Once you are discharged, your primary care physician will handle any further medical issues. Please note that NO REFILLS for any discharge medications will be authorized once you are discharged, as it is imperative that you return to your primary care physician (or establish a relationship with a primary care physician if you do not have one) for your aftercare needs so that they can reassess your need for medications and monitor your lab values.

## 2014-08-23 NOTE — Discharge Summary (Signed)
Jim [name], 80 y.o., DOB Apr 26, 1932, MRN [id number]. Admission date: 08/12/2014 Discharge Date 08/23/2014 Primary MD Jim November, MD Admitting Physician Jim Lis, MD  Admission Diagnosis  Dyspnea [786.09] Renal mass [593.9] Acute pancreatitis, unspecified pancreatitis type [577.0]  Discharge Diagnosis   Principal Problem:   Acute respiratory failure with hypoxia Active Problems:   HYPERLIPIDEMIA   HYPERTENSION   COPD (chronic obstructive pulmonary disease)   Acute pancreatitis   CAP (community acquired pneumonia)   Leukocytosis   Abnormal LFTs   Renal mass   Type II or unspecified type diabetes mellitus without mention of complication, not stated as uncontrolled   Nonspecific (abnormal) findings on radiological and other examination of biliary tract   Acute-on-chronic respiratory failure   Aspiration pneumonitis   Choledocholithiasis   Acute on chronic diastolic heart failure   Preoperative clearance   Past Medical History  Diagnosis Date  . Type 2 diabetes mellitus 10/2009  . COPD (chronic obstructive pulmonary disease)   . Syncope 2010    Evaluated by Dr. Caryl Comes - normal LVEF, possibly neurally mediated  . Hyperlipidemia   . Essential hypertension, benign   . Prostate cancer 2008    s/p XRT  . ED (erectile dysfunction)   . Cholestatic hepatitis 2014  . Personal history of colonic polyps     Past Surgical History  Procedure Laterality Date  . Cataract extraction Bilateral   . Eye surgery Left 07/17/2014  . Colonoscopy    . Ercp N/A 08/16/2014    Procedure: ENDOSCOPIC RETROGRADE CHOLANGIOPANCREATOGRAPHY (ERCP);  Surgeon: Jim Banister, MD;  Location: [facility];  Service: Endoscopy;  Laterality: N/A;  . Cholecystectomy N/A 08/22/2014    Procedure: LAPAROSCOPIC CHOLECYSTECTOMY WITH INTRAOPERATIVE CHOLANGIOGRAM;  Surgeon: Jim Curry, MD;  Location: [facility];  Service: General;  Laterality: N/A;     Hospital Course See H&P, Labs, Consult and Test reports for all  details in brief, patient was admitted for   Principal Problem:   Acute respiratory failure with hypoxia Active Problems:   HYPERLIPIDEMIA   HYPERTENSION   COPD (chronic obstructive pulmonary disease)   Acute pancreatitis   CAP (community acquired pneumonia)   Leukocytosis   Abnormal LFTs   Renal mass   Type II or unspecified type diabetes mellitus without mention of complication, not stated as uncontrolled   Nonspecific (abnormal) findings on radiological and other examination of biliary tract   Acute-on-chronic respiratory failure   Aspiration pneumonitis   Choledocholithiasis   Acute on chronic diastolic heart failure   Preoperative clearance Interim summary   80 year old male with past medical history of hypertension, dyslipidemia, diabetes, chronic diastolic CHF, history of prostate cancer who presented to [facility] ED 08/12/2014 with worsening abdominal pain over past 24 hours prior to this admission. Patient explains pain is in the upper and mid abdomen associated with multiple episodes of nonbloody vomiting. In emergency department, the patient was noted to have a lipase >3000, AST 65, ALT 90, ALT 187, total bilirubin 6.5. BNP was 772, WBC 15.6. CT abdomen showed focal midline retroperitoneal inflammation unclear if this is sequela of duodenitis or possible pancreatitis, possible mid to proximal small bowel wall thickening, non-obstructing sub-centimeters distal common bile duct lesion which could reflect polyp or less likely neoplasm, 16 mm solid left lower pole renal mass highly concerning for renal cell carcinoma.  The patient was placed on bowel rest and started on intravenous fluids for his acute pancreatitis. Gastroenterology was consulted. MRCP was performed as well as MRI of the abdomen  to clarify his left renal mass. The MRCP revealed a distal common bile duct filling defect concerning for a stone. MRI of the abdomen revealed a suspicious left renal lesion concerning for renal cell  carcinoma. Urology has been consulted, and felt due to slow growing nature of these masses, pt can follow up as an outpatient. Gastroenterology plans for ERCP on 08/16/2014. The patient continues on Unasyn for empiric treatment of aspiration pneumonitis and possible cholangitis. Transferred to stepdown on 9/25 for increasing respiratory distress from pulmonary edema. Diuresed well and was transferred back to floor where he has been evaluated by cardiology and cleared for surgery. The patient underwent laparoscopic cholecystectomy on 9/29 , at day of discharge he has been tolerating oral intake very well, denies any abdominal pain, had 2 bowel movements yesterday after the surgery, passing flatus, a febrile.  Assessment/Plan  Acute on chronic respiratory failure, Improving with diuresis. Likely multifactorial from COPD, aspiration pneumonia, and acute diastolic heart failure, now back to home oxygen level of 2.5 to 3L.  - ECHO: Mild LVH, EF 50-55%, no regional wall motion abnl, grade 1 DD,  - Continue to Hold lasix on discharge. - Continue bronchodilators   Acute pancreatitis with CBD stone  --LFTs, lipase improved  --MRCP--distant, bowel duct filling defect suspicious for periampullary stone  - ERCP done on 08/16/14 shows Choledocholiathiasis, treated with biliary sphincterotomy and balloon sweeping.  - [facility] consulted: cholecystectomy done 9/29   Klebsiella bacteremia, likely seeded from cholangitis/obstructing stone, sensitive to unasyn but not ampicillin  - D/c zosyn. Patient will receive perioperative abx and that should be sufficient for his infection  - Repeat blood cultures on 9-25 showing no growth to date, continue with by mouth Cipro and Flagyl to finish total of 14 days. (Another 5 days after discharge)  Renal mass suspicious for RCC based on MRI appearance  -Appreciate urology consult-->followup with Dr. Consuella Lose as outpatient   COPD, stable  - Continue albuterol and Atrovent  -  Completed abx   Hypertension, labile BPs  - Continue carvedilol and losartan  - Continue bidil    Diabetes mellitus type 2, CBG well controlled  - 08/12/2014 hemoglobin A1c 7.0  - Cont metformin upon  Hypokalemia  due to diuresis    Normocytic anemia due to chronic disease and acute illness  - Hemoglobin approximately stable near 10 mg  Consultants:  General surgery, Dr. Johney Maine  GI, Dr. Carlean Purl  Urology, Dr. Junious Silk Procedures:  9/20 CXR: NAD  9/19 CXR: Scarring, possibly mild pneumonia or atelectasis, mild vascular congestion  9/19 CT abd/pelvis: Focal midline retroperitoneal inflammation, unclear if this is reflects sequelae duodenitis or possible pancreatitis. Suspected mild proximal small bowel wall thickening. Nonobstructing subcentimeter distal Common bile duct lesion, which could reflect polyp or less likely neoplasm, possible sludge ball. Recommend MRCP on a nonemergent basis. Small hiatal hernia with moderate amount of associated mildly inflamed fat, which is possibly tracking from the retroperitoneum. 16 mm solid LEFT lower pole renal mass highly concerning for renal cell carcinoma  9/21 MRCP: Filling defect within the distal common duct, highly suspicious for periampullary stone. This is positioned more distally than on 08/12/14 CT. Mild secondary biliary ductal dilatation. 3. 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 4. Progressive moderate pancreatitis  ERCP 08-16-14 The CBD was dilated diffusely up to 23mm and there was a round filling defect in distal bile duct. An adequate biliary sphincterotomy was performed over the biliary wire and then a  biliary retrieval balloon was used to sweep the bile ducts. A single 37mm greenish stone was delivered into the duodenum. There was no purulence. A completion, occlusion cholangiogram showed no further filling defects. The main pancreatic duct was never cannulated or injected with  dye 9/29 laparoscopic cholecystectomy by Dr. Greer Pickerel.  Antibiotics:  Levofloxacin 9/19>>9/20  unasyn 08/13/14>>> 9/25  Zosyn 9/25 >> 9/29 Oral Cipro and Flagyl started the day of discharge 9/30 to continue to 10/4  Discussed with son and date of discharge. Significant Tests:  See full reports for all details    Dg Chest 2 View  08/21/2014   CLINICAL DATA:  Cough and shortness of breath.  EXAM: CHEST  2 VIEW  COMPARISON:  08/19/2012.  FINDINGS: Improved interstitial edema. Persistent consolidation at the LEFT base with LEFT effusion. Stable linear scarring RIGHT upper lobe. Stable cardiomediastinal silhouette with calcified tortuous aorta.  IMPRESSION: Overall slight improvement aeration.   Electronically Signed   By: Rolla Flatten M.D.   On: 08/21/2014 08:18   Dg Chest 2 View  08/13/2014   CLINICAL DATA:  Shortness of breath.  EXAM: CHEST  2 VIEW  COMPARISON:  08/12/2014.  FINDINGS: Normal sized heart. No significant change in bilateral linear densities. Moderate sized hiatal hernia. This contained fat only on the CT dated 06/27/2010. Mild thoracic spine degenerative changes.  IMPRESSION: 1. No acute abnormality. 2. Stable bilateral scarring. 3. Moderate-sized hiatal hernia, previously shown to contain fat only.   Electronically Signed   By: Enrique Sack M.D.   On: 08/13/2014 14:25   Dg Chest 2 View  08/12/2014   CLINICAL DATA:  Severe shortness of breath. Dyspnea. Abdominal pain and bloating.  EXAM: CHEST  2 VIEW  COMPARISON:  Chest radiograph performed 12/23/2010  FINDINGS: The lungs are well-aerated. Right mid lung scarring is noted. Minimal left basilar opacity may reflect atelectasis or possibly mild pneumonia, given the patient's symptoms. Mild vascular congestion is noted. There is no evidence of pleural effusion or pneumothorax.  The heart is normal in size; the mediastinal contour is within normal limits. No acute osseous abnormalities are seen.  IMPRESSION: Right mid lung scarring again  noted; minimal left basilar airspace opacity may reflect atelectasis or possibly mild pneumonia, given the patient's symptoms. Mild vascular congestion noted.   Electronically Signed   By: Garald Balding M.D.   On: 08/12/2014 05:22   Dg Cholangiogram Operative  08/22/2014   CLINICAL DATA:  Laparoscopic cholecystectomy.  EXAM: INTRAOPERATIVE CHOLANGIOGRAM  FLUOROSCOPY TIME:  20 seconds  COMPARISON:  ERCP - 08/16/2014; MRCP - 08/14/2014  FINDINGS: Intraoperative angiographic images of the right upper abdominal quadrant during laparoscopic cholecystectomy are provided for review.  Surgical clips overlie the expected location of the gallbladder fossa.  Contrast injection demonstrates selective cannulation of the central aspect of the cystic duct.  There is passage of contrast through the central aspect of the cystic duct with filling of a non dilated common bile duct. There is passage of contrast though the CBD and into the descending portion of the duodenum. 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  There is minimal reflux of injected contrast into the common hepatic duct and central aspect of the non dilated intrahepatic biliary system.  There is a persistent nonocclusive filling defect within the central aspect of the cystic duct.  IMPRESSION: 1. Persistent nonocclusive filling defect in the central aspect of the cystic duct, possibly an air bubble though choledocholithiasis could  have a similar appearance. Correlation with the operative report is recommended. 2. 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   Electronically Signed   By: Sandi Mariscal M.D.   On: 08/22/2014 14:41   Ct Abdomen Pelvis W Contrast  08/12/2014   CLINICAL DATA:  Severe shortness of breath and severe abdominal pain.  EXAM: CT ABDOMEN AND PELVIS WITH CONTRAST  TECHNIQUE: Multidetector CT imaging of the abdomen and pelvis was  performed using the standard protocol following bolus administration of intravenous contrast.  CONTRAST:  49mL OMNIPAQUE IOHEXOL 300 MG/ML SOLN, 166mL OMNIPAQUE IOHEXOL 300 MG/ML SOLN  COMPARISON:  Abdominal ultrasound November 10, 2013  FINDINGS: LUNG BASES: Limited view of the lung bases demonstrates severe centrilobular emphysema. Dependent atelectasis. Heart size is normal. Coronary artery calcifications. Included pericardium is unremarkable.  SOLID ORGANS: The liver, spleen, gallbladder, and adrenal glands are unremarkable. Focal midline retroperitoneal inflammation, contiguous with the inferior margin of the pancreas and, ligaments of Treitz.  GASTROINTESTINAL TRACT: Small hiatal hernia, with under amount of surrounding mildly inflamed fat. The stomach, small and large bowel are normal in course and caliber without inflammatory changes. Suspected proximal small bowel wall thickening. Mild colonic diverticulosis. Normal appendix.  KIDNEYS/ URINARY TRACT: Kidneys are orthotopic, demonstrating symmetric enhancement. Solid enhancing LEFT lower pole 16 mm mass with mild washout on delayed phase. No nephrolithiasis, hydronephrosis. 10 mm RIGHT lower pole cysts. Too small to characterize RIGHT interpolar hypodensity. The unopacified ureters are normal in course and caliber. Delayed imaging through the kidneys demonstrates symmetric prompt contrast excretion within the proximal urinary collecting system. Urinary bladder is partially distended and unremarkable.  PERITONEUM/RETROPERITONEUM: The Common bile duct is not enlarged, however there is a 5 mm soft tissue lesion along the posterior wall of the distal Common bile duct, 51/101 and 50/119. No intraperitoneal free fluid nor free air. Aortoiliac vessels are normal in course and caliber, moderate calcific atherosclerosis. No lymphadenopathy by CT size criteria. Prostate brachytherapy seeds. Partially imaged small LEFT hydrocele.  SOFT TISSUE/OSSEOUS STRUCTURES:  Nonsuspicious. Grade 1 L4-5 anterolisthesis on spondylotic basis.  IMPRESSION: Focal midline retroperitoneal inflammation, unclear if this is reflects sequelae duodenitis or possible pancreatitis. Suspected mild proximal small bowel wall thickening.  Nonobstructing subcentimeter distal Common bile duct lesion, which could reflect polyp or less likely neoplasm, possible sludge ball. Recommend MRCP on a nonemergent basis.  Small hiatal hernia with moderate amount of associated mildly inflamed fat, which is possibly tracking from the retroperitoneum.  16 mm solid LEFT lower pole renal mass highly concerning for renal cell carcinoma. Recommend further characterization with MRI, biopsy or surgery. This recommendation follows ACR consensus guidelines: Managing Incidental Findings on Abdominal CT: White Paper of the ACR Incidental Findings Committee. Joellyn Rued Radiol [id number]   Electronically Signed   By: Elon Alas   On: 08/12/2014 06:58   Mr 3d Recon At Scanner  08/14/2014   CLINICAL DATA:  Dilated common bile duct on CT. Renal mass. Elevated liver function tests.  EXAM: MRI ABDOMEN WITHOUT AND WITH CONTRAST (INCLUDING MRCP)  TECHNIQUE: Multiplanar multisequence MR imaging of the abdomen was performed both before and after the administration of intravenous contrast. Heavily T2-weighted images of the biliary and pancreatic ducts were obtained, and three-dimensional MRCP images were rendered by post processing.  CONTRAST:  56mL MULTIHANCE GADOBENATE DIMEGLUMINE 529 MG/ML IV SOLN  COMPARISON:  CT of 08/12/14.  FINDINGS: Lower chest: Normal heart size.  Trace bilateral pleural effusions.  Motion  degradation throughout the abdomen.  Tiny hiatal hernia.  Hepatobiliary: No focal liver lesion. Minimal intrahepatic biliary ductal dilatation. Normal gallbladder. Common duct mildly dilated, up to 12 mm on image 46 of series 4. Distal common duct filling defect at the level of the ampulla. 9 mm on image 37 of series  4. Image 88 of series 400 and image 27 of series 11. This appears more distally positioned than on the 08/12/2014 CT.  Spleen: Normal  Pancreas: Moderate peripancreatic edema, increased since 08/12/14. Example image 26 of series 3. No pancreatic ductal dilatation or mass. No peripancreatic fluid collection. Diffuse pancreatic enhancement, without evidence of necrosis.  Stomach/Bowel: Normal distal stomach and imaged abdominal bowel loops.  Adrenals/Urinary tract: Normal adrenal glands. 2 right renal lesions. An interpolar lesion measures 6 mm and is too small to entirely characterize. Likely a cyst. A lower pole right renal lesion measures 9 mm and is most consistent with a cyst.  Corresponding to the CT abnormality, 1.5 cm inter/lower pole left renal lesion demonstrates T2 hypo intensity on image 42 of series 3 and post-contrast enhancement, including on image 58 of series 13002. No hydronephrosis.  Vascular/Lymphatic: Atherosclerotic irregularity in the aorta and branch vessels. No retroperitoneal adenopathy. Left renal vein patent.  Musculoskeletal: No focal osseous abnormality.  Other:  No ascites.  IMPRESSION: 1. Mildly motion degraded exam. 2. Filling defect within the distal common duct, highly suspicious for periampullary stone. This is positioned more distally than on 08/12/14 CT. Mild secondary biliary ductal dilatation. 3. 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 4. Progressive moderate pancreatitis.   Electronically Signed   By: Abigail Miyamoto M.D.   On: 08/14/2014 10:21   Dg Chest Port 1 View  08/19/2014   CLINICAL DATA:  Follow up pulmonary edema.  EXAM: PORTABLE CHEST - 1 VIEW  COMPARISON:  08/18/2014 and 12/23/2010  FINDINGS: There continues to be prominent interstitial lung markings suggesting interstitial edema. However, there is improving aeration at the lung bases. Stable linear densities in the right upper chest are compatible with an  area of scarring. The heart size is normal. There is persistent consolidation in the retrocardiac space. Suspect left pleural fluid.  IMPRESSION: Slightly decreased interstitial edema.  Persistent consolidation at the left lung base and suspect left pleural fluid.   Electronically Signed   By: Markus Daft M.D.   On: 08/19/2014 06:54   Dg Chest Port 1 View  08/18/2014   CLINICAL DATA:  Labored breathing  EXAM: PORTABLE CHEST - 1 VIEW  COMPARISON:  08/13/2014  FINDINGS: New diffuse interstitial prominence with small pleural effusions. An apparent cavity in the right upper lung is likely scarring based on previous imaging. Unchanged normal heart size. Unchanged aortic tortuosity. No pneumothorax.  IMPRESSION: New pulmonary edema and small pleural effusions.   Electronically Signed   By: Jorje Guild M.D.   On: 08/18/2014 05:02   Dg Ercp Biliary & Pancreatic Ducts  08/16/2014   CLINICAL DATA:  Choledocholithiasis  EXAM: ERCP with balloon sweep and sphincterotomy  TECHNIQUE: Multiple spot images obtained with the fluoroscopic device and submitted for interpretation post-procedure.  COMPARISON:  08/14/2014  FINDINGS: Limited spot fluoroscopic views during ERCP demonstrate cannulation, guidewire access and contrast injection of the common bile duct. Mild CBD dilatation. Balloon sweep of the common bile duct performed. Distal CBD filling defect initially noted. Apparently the procedure was successful at stone removal. Please refer to the operative note for details of stone  removal.  FLUOROSCOPY TIME:  3.3 min  IMPRESSION: Limited imaging during ERCP with balloon sweep and sphincterotomy for stone removal.  These images were submitted for radiologic interpretation only. Please see the procedural report for the amount of contrast and the fluoroscopy time utilized.   Electronically Signed   By: Daryll Brod M.D.   On: 08/16/2014 10:41   Mr Jeananne Rama W/wo Cm/mrcp  08/14/2014   CLINICAL DATA:  Dilated common bile duct on  CT. Renal mass. Elevated liver function tests.  EXAM: MRI ABDOMEN WITHOUT AND WITH CONTRAST (INCLUDING MRCP)  TECHNIQUE: Multiplanar multisequence MR imaging of the abdomen was performed both before and after the administration of intravenous contrast. Heavily T2-weighted images of the biliary and pancreatic ducts were obtained, and three-dimensional MRCP images were rendered by post processing.  CONTRAST:  13mL MULTIHANCE GADOBENATE DIMEGLUMINE 529 MG/ML IV SOLN  COMPARISON:  CT of 08/12/14.  FINDINGS: Lower chest: Normal heart size.  Trace bilateral pleural effusions.  Motion degradation throughout the abdomen.  Tiny hiatal hernia.  Hepatobiliary: No focal liver lesion. Minimal intrahepatic biliary ductal dilatation. Normal gallbladder. Common duct mildly dilated, up to 12 mm on image 46 of series 4. Distal common duct filling defect at the level of the ampulla. 9 mm on image 37 of series 4. Image 88 of series 400 and image 27 of series 11. This appears more distally positioned than on the 08/12/2014 CT.  Spleen: Normal  Pancreas: Moderate peripancreatic edema, increased since 08/12/14. Example image 26 of series 3. No pancreatic ductal dilatation or mass. No peripancreatic fluid collection. Diffuse pancreatic enhancement, without evidence of necrosis.  Stomach/Bowel: Normal distal stomach and imaged abdominal bowel loops.  Adrenals/Urinary tract: Normal adrenal glands. 2 right renal lesions. An interpolar lesion measures 6 mm and is too small to entirely characterize. Likely a cyst. A lower pole right renal lesion measures 9 mm and is most consistent with a cyst.  Corresponding to the CT abnormality, 1.5 cm inter/lower pole left renal lesion demonstrates T2 hypo intensity on image 42 of series 3 and post-contrast enhancement, including on image 58 of series 13002. No hydronephrosis.  Vascular/Lymphatic: Atherosclerotic irregularity in the aorta and branch vessels. No retroperitoneal adenopathy. Left renal vein  patent.  Musculoskeletal: No focal osseous abnormality.  Other:  No ascites.  IMPRESSION: 1. Mildly motion degraded exam. 2. Filling defect within the distal common duct, highly suspicious for periampullary stone. This is positioned more distally than on 08/12/14 CT. Mild secondary biliary ductal dilatation. 3. 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 4. Progressive moderate pancreatitis.   Electronically Signed   By: Abigail Miyamoto M.D.   On: 08/14/2014 10:21     Today   Subjective:   Jim [name] today has no headache,no chest abdominal pain,no new weakness tingling or numbness, feels much better wants to go home today. Had 2 bowel movements yesterday, passing flatus, denies any fever or chills.  Objective:   Blood pressure 152/68, pulse 77, temperature 98.9 F (37.2 C), temperature source Oral, resp. rate 16, height 5\' 10"  (1.778 m), weight 80.015 kg (176 lb 6.4 oz), SpO2 94.00%.  Intake/Output Summary (Last 24 hours) at 08/23/14 1448 Last data filed at 08/23/14 0833  Gross per 24 hour  Intake    590 ml  Output   1225 ml  Net   -635 ml    Exam Awake Alert, Oriented *3, No new F.N deficits, Normal affect Rennert.AT,PERRAL Supple Neck,No JVD, No cervical  lymphadenopathy appriciated.  Symmetrical Chest wall movement, Good air movement bilaterally, CTAB RRR,No Gallops,Rubs or new Murmurs, No Parasternal Heave +ve B.Sounds, Abd Soft, Non tender, No organomegaly appriciated, No rebound -guarding or rigidity. having laparoscopic surgical scar healing nicely with no discharge or losing from site. No Cyanosis, Clubbing or edema, No new Rash or bruise  Data Review   Cultures - Results for orders placed during the hospital encounter of 08/12/14  CULTURE, BLOOD (ROUTINE X 2)     Status: None   Collection Time    08/12/14  9:19 AM      Result Value Ref Range Status   Specimen Description BLOOD RIGHT ARM   Final   Special Requests      Final   Value: BOTTLES DRAWN AEROBIC AND ANAEROBIC 10CC BLUE BOTTLE, 7 CC RED BOTTLE   Culture  Setup Time     Final   Value: 08/12/2014 13:42     Performed at [facility]   Culture     Final   Value: NO GROWTH 5 DAYS     Performed at [facility]   Report Status 08/18/2014 FINAL   Final  CULTURE, BLOOD (ROUTINE X 2)     Status: None   Collection Time    08/12/14  9:24 AM      Result Value Ref Range Status   Specimen Description BLOOD RIGHT ARM   Final   Special Requests     Final   Value: BOTTLES DRAWN AEROBIC AND ANAEROBIC 10CC BOTH BOTTLES   Culture  Setup Time     Final   Value: 08/12/2014 13:42     Performed at [facility]   Culture     Final   Value: KLEBSIELLA PNEUMONIAE     Note: Gram Stain Report Called to,Read Back By and Verified With: Trinna Balloon ON 08/16/2014 AT 11:55P BY WILEJ     Performed at [facility]   Report Status 08/18/2014 FINAL   Final   Organism ID, Bacteria KLEBSIELLA PNEUMONIAE   Final  CULTURE, EXPECTORATED SPUTUM-ASSESSMENT     Status: None   Collection Time    08/13/14  1:56 AM      Result Value Ref Range Status   Specimen Description SPUTUM   Final   Special Requests NONE   Final   Sputum evaluation     Final   Value: MICROSCOPIC FINDINGS SUGGEST THAT THIS SPECIMEN IS NOT REPRESENTATIVE OF LOWER RESPIRATORY SECRETIONS. PLEASE RECOLLECT.     Enid Derry AT 0216 ON 09.20.2015 BY NBROOKS   Report Status 08/13/2014 FINAL   Final  CULTURE, EXPECTORATED SPUTUM-ASSESSMENT     Status: None   Collection Time    08/13/14  6:47 AM      Result Value Ref Range Status   Specimen Description SPUTUM   Final   Special Requests NONE   Final   Sputum evaluation     Final   Value: THIS SPECIMEN IS ACCEPTABLE. RESPIRATORY CULTURE REPORT TO FOLLOW.   Report Status 08/13/2014 FINAL   Final  CULTURE, RESPIRATORY (NON-EXPECTORATED)     Status: None   Collection Time    08/13/14  6:47 AM      Result Value Ref Range Status    Specimen Description SPUTUM   Final   Special Requests NONE   Final   Gram Stain     Final   Value: FEW WBC PRESENT, PREDOMINANTLY MONONUCLEAR     RARE SQUAMOUS EPITHELIAL CELLS PRESENT  RARE GRAM POSITIVE COCCI     IN PAIRS IN CLUSTERS RARE GRAM NEGATIVE RODS     Performed at [facility]   Culture     Final   Value: NORMAL OROPHARYNGEAL FLORA     Performed at [facility]   Report Status 08/15/2014 FINAL   Final  URINE CULTURE     Status: None   Collection Time    08/13/14  1:58 PM      Result Value Ref Range Status   Specimen Description URINE, CATHETERIZED   Final   Special Requests NONE   Final   Culture  Setup Time     Final   Value: 08/13/2014 19:11     Performed at [place]     Final   Value: NO GROWTH     Performed at [facility]   Culture     Final   Value: NO GROWTH     Performed at [facility]   Report Status 08/14/2014 FINAL   Final  SURGICAL PCR SCREEN     Status: Abnormal   Collection Time    08/18/14  1:05 AM      Result Value Ref Range Status   MRSA, PCR INVALID RESULTS, SPECIMEN SENT FOR CULTURE (*) NEGATIVE Final   Comment: RESULT CALLED TO, READ BACK BY AND VERIFIED WITH:     V.STEWART,RN AT 0303 ON 08/18/14 BY SHEAW   Staphylococcus aureus INVALID RESULTS, SPECIMEN SENT FOR CULTURE (*) NEGATIVE Final   Comment: RESULT CALLED TO, READ BACK BY AND VERIFIED WITH:     V.STEWART,RN AT 0303 ON 08/18/14 BY SHEAW                The Xpert SA Assay (FDA     approved for NASAL specimens     in patients over 35 years of age),     is one component of     a comprehensive surveillance     program.  Test performance has     been validated by [employer] for patients greater     than or equal to 32 year old.     It is not intended     to diagnose infection nor to     guide or monitor treatment.  MRSA CULTURE     Status: None   Collection Time    08/18/14  1:05 AM      Result Value Ref Range  Status   Specimen Description NOSE   Final   Special Requests NONE   Final   Culture     Final   Value: NO STAPHYLOCOCCUS AUREUS ISOLATED     Note: No MRSA Isolated     Performed at [facility]   Report Status 08/20/2014 FINAL   Final  CULTURE, BLOOD (ROUTINE X 2)     Status: None   Collection Time    08/18/14  8:31 AM      Result Value Ref Range Status   Specimen Description BLOOD LEFT HAND   Final   Special Requests BOTTLES DRAWN AEROBIC AND ANAEROBIC 10CC   Final   Culture  Setup Time     Final   Value: 08/18/2014 11:06     Performed at [facility]   Culture     Final   Value:        BLOOD CULTURE RECEIVED NO GROWTH TO DATE CULTURE WILL BE HELD FOR  5 DAYS BEFORE ISSUING A FINAL NEGATIVE REPORT     Performed at [facility]   Report Status PENDING   Incomplete  CULTURE, BLOOD (ROUTINE X 2)     Status: None   Collection Time    08/18/14  8:38 AM      Result Value Ref Range Status   Specimen Description BLOOD RIGHT ARM   Final   Special Requests BOTTLES DRAWN AEROBIC AND ANAEROBIC 10CC   Final   Culture  Setup Time     Final   Value: 08/18/2014 11:06     Performed at [facility]   Culture     Final   Value:        BLOOD CULTURE RECEIVED NO GROWTH TO DATE CULTURE WILL BE HELD FOR 5 DAYS BEFORE ISSUING A FINAL NEGATIVE REPORT     Performed at [facility]   Report Status PENDING   Incomplete  SURGICAL PCR SCREEN     Status: None   Collection Time    08/22/14  7:39 AM      Result Value Ref Range Status   MRSA, PCR NEGATIVE  NEGATIVE Final   Comment: DELTA CHECK NOTED   Staphylococcus aureus NEGATIVE  NEGATIVE Final   Comment:            The Xpert SA Assay (FDA     approved for NASAL specimens     in patients over 55 years of age),     is one component of     a comprehensive surveillance     program.  Test performance has     been validated by [employer] for patients greater     than or equal to 90 year old.     It is not  intended     to diagnose infection nor to     guide or monitor treatment.     DELTA CHECK NOTED     CBC w Diff: Lab Results  Component Value Date   WBC 11.1* 08/23/2014   HGB 11.4* 08/23/2014   HCT 33.7* 08/23/2014   PLT 346 08/23/2014   LYMPHOPCT 6* 08/12/2014   MONOPCT 5 08/12/2014   EOSPCT 0 08/12/2014   BASOPCT 0 08/12/2014   CMP: Lab Results  Component Value Date   NA 135* 08/22/2014   K 3.6* 08/22/2014   CL 97 08/22/2014   CO2 25 08/22/2014   BUN 11 08/22/2014   CREATININE 0.93 08/22/2014   PROT 6.0 08/22/2014   ALBUMIN 2.6* 08/22/2014   BILITOT 0.9 08/22/2014   ALKPHOS 86 08/22/2014   AST 17 08/22/2014   ALT 14 08/22/2014  .  Micro Results Recent Results (from the past 240 hour(s))  SURGICAL PCR SCREEN     Status: Abnormal   Collection Time    08/18/14  1:05 AM      Result Value Ref Range Status   MRSA, PCR INVALID RESULTS, SPECIMEN SENT FOR CULTURE (*) NEGATIVE Final   Comment: RESULT CALLED TO, READ BACK BY AND VERIFIED WITH:     V.STEWART,RN AT 0303 ON 08/18/14 BY SHEAW   Staphylococcus aureus INVALID RESULTS, SPECIMEN SENT FOR CULTURE (*) NEGATIVE Final   Comment: RESULT CALLED TO, READ BACK BY AND VERIFIED WITH:     V.STEWART,RN AT 0303 ON 08/18/14 BY SHEAW                The Xpert SA Assay (FDA     approved for NASAL  specimens     in patients over 35 years of age),     is one component of     a comprehensive surveillance     program.  Test performance has     been validated by [employer] for patients greater     than or equal to 45 year old.     It is not intended     to diagnose infection nor to     guide or monitor treatment.  MRSA CULTURE     Status: None   Collection Time    08/18/14  1:05 AM      Result Value Ref Range Status   Specimen Description NOSE   Final   Special Requests NONE   Final   Culture     Final   Value: NO STAPHYLOCOCCUS AUREUS ISOLATED     Note: No MRSA Isolated     Performed at [facility]   Report Status 08/20/2014  FINAL   Final  CULTURE, BLOOD (ROUTINE X 2)     Status: None   Collection Time    08/18/14  8:31 AM      Result Value Ref Range Status   Specimen Description BLOOD LEFT HAND   Final   Special Requests BOTTLES DRAWN AEROBIC AND ANAEROBIC 10CC   Final   Culture  Setup Time     Final   Value: 08/18/2014 11:06     Performed at [facility]   Culture     Final   Value:        BLOOD CULTURE RECEIVED NO GROWTH TO DATE CULTURE WILL BE HELD FOR 5 DAYS BEFORE ISSUING A FINAL NEGATIVE REPORT     Performed at [facility]   Report Status PENDING   Incomplete  CULTURE, BLOOD (ROUTINE X 2)     Status: None   Collection Time    08/18/14  8:38 AM      Result Value Ref Range Status   Specimen Description BLOOD RIGHT ARM   Final   Special Requests BOTTLES DRAWN AEROBIC AND ANAEROBIC 10CC   Final   Culture  Setup Time     Final   Value: 08/18/2014 11:06     Performed at [facility]   Culture     Final   Value:        BLOOD CULTURE RECEIVED NO GROWTH TO DATE CULTURE WILL BE HELD FOR 5 DAYS BEFORE ISSUING A FINAL NEGATIVE REPORT     Performed at [facility]   Report Status PENDING   Incomplete  SURGICAL PCR SCREEN     Status: None   Collection Time    08/22/14  7:39 AM      Result Value Ref Range Status   MRSA, PCR NEGATIVE  NEGATIVE Final   Comment: DELTA CHECK NOTED   Staphylococcus aureus NEGATIVE  NEGATIVE Final   Comment:            The Xpert SA Assay (FDA     approved for NASAL specimens     in patients over 23 years of age),     is one component of     a comprehensive surveillance     program.  Test performance has     been validated by [employer] for patients greater     than or equal to 6 year old.     It is not intended  to diagnose infection nor to     guide or monitor treatment.     DELTA CHECK NOTED     Discharge Instructions      Follow-up Information   Follow up with Claybon Jabs, MD. (call office for appt after discharge  from hospital )    Specialty:  Urology   Contact information:   [place] [address] [phone]       Follow up with Jim November, MD In 1 week.   Specialty:  Internal Medicine   Contact information:   [address] [place] [address] [phone]       Discharge Medications     Medication List    STOP taking these medications       furosemide 20 MG tablet  Commonly known as:  LASIX     guaiFENesin 600 MG 12 hr tablet  Commonly known as:  MUCINEX      TAKE these medications       albuterol (2.5 MG/3ML) 0.083% nebulizer solution  Commonly known as:  PROVENTIL  Take 3 mLs (2.5 mg total) by nebulization 4 (four) times daily as needed.     aspirin 81 MG tablet  Take 81 mg by mouth daily.     atorvastatin 20 MG tablet  Commonly known as:  LIPITOR  take 1 tablet by mouth once daily     carvedilol 25 MG tablet  Commonly known as:  COREG  take 1 tablet by mouth twice a day with meals     ciprofloxacin 500 MG tablet  Commonly known as:  CIPRO  Take 1 tablet (500 mg total) by mouth 2 (two) times daily.     COMBIVENT RESPIMAT 20-100 MCG/ACT Aers respimat  Generic drug:  Ipratropium-Albuterol  Inhale 1 puff into the lungs every 6 (six) hours as needed for wheezing.     lactobacillus acidophilus Tabs tablet  Take 2 tablets by mouth 3 (three) times daily.     losartan 100 MG tablet  Commonly known as:  COZAAR  take 1 tablet by mouth once daily     metFORMIN 500 MG tablet  Commonly known as:  GLUCOPHAGE  take 1 tablet by mouth twice a day with meals     metroNIDAZOLE 500 MG tablet  Commonly known as:  FLAGYL  Take 1 tablet (500 mg total) by mouth 3 (three) times daily.     PRESERVISION AREDS PO  Take 2 tablets by mouth daily.     SYMBICORT 160-4.5 MCG/ACT inhaler  Generic drug:  budesonide-formoterol  inhale 2 puffs by mouth twice a day         Total Time in preparing paper work, data evaluation and todays exam - 35  minutes  Othon Guardia M.D on 08/23/2014 at 2:48 PM  [facility] Office  [phone]

## 2014-08-23 NOTE — Progress Notes (Signed)
Subjective:  No CP/SOB, POD #1 lap chole  Objective:  Temp:  [97.8 F (36.6 C)-99.3 F (37.4 C)] 98.9 F (37.2 C) (09/30 0544) Pulse Rate:  [54-81] 77 (09/30 0544) Resp:  [15-20] 16 (09/30 0544) BP: (94-164)/(51-134) 152/68 mmHg (09/30 0544) SpO2:  [90 %-100 %] 94 % (09/30 0544) Weight change:   Intake/Output from previous day: 09/29 0701 - 09/30 0700 In: 1400 [I.V.:1400] Out: 1550 [Urine:1550]  Intake/Output from this shift:    Physical Exam: General appearance: alert and no distress Neck: no adenopathy, no carotid bruit, no JVD, supple, symmetrical, trachea midline and thyroid not enlarged, symmetric, no tenderness/mass/nodules Lungs: clear to auscultation bilaterally Heart: regular rate and rhythm, S1, S2 normal, no murmur, click, rub or gallop Extremities: extremities normal, atraumatic, no cyanosis or edema  Lab Results: Results for orders placed during the hospital encounter of 08/12/14 (from the past 48 hour(s))  GLUCOSE, CAPILLARY     Status: Abnormal   Collection Time    08/21/14 12:17 PM      Result Value Ref Range   Glucose-Capillary 145 (*) 70 - 99 mg/dL  GLUCOSE, CAPILLARY     Status: Abnormal   Collection Time    08/21/14  4:52 PM      Result Value Ref Range   Glucose-Capillary 186 (*) 70 - 99 mg/dL  GLUCOSE, CAPILLARY     Status: Abnormal   Collection Time    08/21/14  9:19 PM      Result Value Ref Range   Glucose-Capillary 126 (*) 70 - 99 mg/dL  GLUCOSE, CAPILLARY     Status: Abnormal   Collection Time    08/22/14  3:32 AM      Result Value Ref Range   Glucose-Capillary 124 (*) 70 - 99 mg/dL  CBC     Status: Abnormal   Collection Time    08/22/14  4:05 AM      Result Value Ref Range   WBC 11.2 (*) 4.0 - 10.5 K/uL   RBC 3.32 (*) 4.22 - 5.81 MIL/uL   Hemoglobin 10.4 (*) 13.0 - 17.0 g/dL   HCT 30.7 (*) 39.0 - 52.0 %   MCV 92.5  78.0 - 100.0 fL   MCH 31.3  26.0 - 34.0 pg   MCHC 33.9  30.0 - 36.0 g/dL   RDW 13.0  11.5 - 15.5 %   Platelets 289  150 - 400 K/uL  COMPREHENSIVE METABOLIC PANEL     Status: Abnormal   Collection Time    08/22/14  4:05 AM      Result Value Ref Range   Sodium 135 (*) 137 - 147 mEq/L   Potassium 3.6 (*) 3.7 - 5.3 mEq/L   Chloride 97  96 - 112 mEq/L   CO2 25  19 - 32 mEq/L   Glucose, Bld 122 (*) 70 - 99 mg/dL   BUN 11  6 - 23 mg/dL   Creatinine, Ser 0.93  0.50 - 1.35 mg/dL   Calcium 8.2 (*) 8.4 - 10.5 mg/dL   Total Protein 6.0  6.0 - 8.3 g/dL   Albumin 2.6 (*) 3.5 - 5.2 g/dL   AST 17  0 - 37 U/L   ALT 14  0 - 53 U/L   Alkaline Phosphatase 86  39 - 117 U/L   Total Bilirubin 0.9  0.3 - 1.2 mg/dL   GFR calc non Af Amer 77 (*) >90 mL/min   GFR calc Af Amer 90 (*) >90 mL/min  Comment: (NOTE)     The eGFR has been calculated using the CKD EPI equation.     This calculation has not been validated in all clinical situations.     eGFR's persistently <90 mL/min signify possible Chronic Kidney     Disease.   Anion gap 13  5 - 15  SURGICAL PCR SCREEN     Status: None   Collection Time    08/22/14  7:39 AM      Result Value Ref Range   MRSA, PCR NEGATIVE  NEGATIVE   Comment: DELTA CHECK NOTED   Staphylococcus aureus NEGATIVE  NEGATIVE   Comment:            The Xpert SA Assay (FDA     approved for NASAL specimens     in patients over 100 years of age),     is one component of     a comprehensive surveillance     program.  Test performance has     been validated by [employer] for patients greater     than or equal to 13 year old.     It is not intended     to diagnose infection nor to     guide or monitor treatment.     DELTA CHECK NOTED  GLUCOSE, CAPILLARY     Status: Abnormal   Collection Time    08/22/14  7:54 AM      Result Value Ref Range   Glucose-Capillary 137 (*) 70 - 99 mg/dL  GLUCOSE, CAPILLARY     Status: Abnormal   Collection Time    08/22/14 11:30 AM      Result Value Ref Range   Glucose-Capillary 150 (*) 70 - 99 mg/dL  ABO/RH     Status: None   Collection  Time    08/22/14 12:20 PM      Result Value Ref Range   ABO/RH(D) O POS    TYPE AND SCREEN     Status: None   Collection Time    08/22/14 12:57 PM      Result Value Ref Range   ABO/RH(D) O POS     Antibody Screen NEG     Sample Expiration 08/25/2014    GLUCOSE, CAPILLARY     Status: Abnormal   Collection Time    08/22/14  2:12 PM      Result Value Ref Range   Glucose-Capillary 147 (*) 70 - 99 mg/dL   Comment 1 Documented in Chart     Comment 2 Notify RN    GLUCOSE, CAPILLARY     Status: Abnormal   Collection Time    08/22/14  4:57 PM      Result Value Ref Range   Glucose-Capillary 160 (*) 70 - 99 mg/dL  GLUCOSE, CAPILLARY     Status: Abnormal   Collection Time    08/22/14  8:18 PM      Result Value Ref Range   Glucose-Capillary 196 (*) 70 - 99 mg/dL  GLUCOSE, CAPILLARY     Status: Abnormal   Collection Time    08/23/14 12:27 AM      Result Value Ref Range   Glucose-Capillary 146 (*) 70 - 99 mg/dL  GLUCOSE, CAPILLARY     Status: Abnormal   Collection Time    08/23/14  4:34 AM      Result Value Ref Range   Glucose-Capillary 137 (*) 70 - 99 mg/dL  CBC     Status:  Abnormal   Collection Time    08/23/14  4:45 AM      Result Value Ref Range   WBC 11.1 (*) 4.0 - 10.5 K/uL   RBC 3.61 (*) 4.22 - 5.81 MIL/uL   Hemoglobin 11.4 (*) 13.0 - 17.0 g/dL   HCT 33.7 (*) 39.0 - 52.0 %   MCV 93.4  78.0 - 100.0 fL   MCH 31.6  26.0 - 34.0 pg   MCHC 33.8  30.0 - 36.0 g/dL   RDW 12.7  11.5 - 15.5 %   Platelets 346  150 - 400 K/uL  GLUCOSE, CAPILLARY     Status: Abnormal   Collection Time    08/23/14  7:21 AM      Result Value Ref Range   Glucose-Capillary 141 (*) 70 - 99 mg/dL    Imaging: Imaging results have been reviewed  Assessment/Plan:   1. Principal Problem: 2.   Acute respiratory failure with hypoxia 3. Active Problems: 4.   HYPERLIPIDEMIA 5.   HYPERTENSION 6.   COPD (chronic obstructive pulmonary disease) 7.   Acute pancreatitis 8.   CAP (community acquired  pneumonia) 59.   Leukocytosis 10.   Abnormal LFTs 11.   Renal mass 12.   Type II or unspecified type diabetes mellitus without mention of complication, not stated as uncontrolled 13.   Nonspecific (abnormal) findings on radiological and other examination of biliary tract 14.   Acute-on-chronic respiratory failure 15.   Aspiration pneumonitis 16.   Choledocholithiasis 17.   Acute on chronic diastolic heart failure 18.   Preoperative clearance 19.   Time Spent Directly with Patient:  20 minutes  Length of Stay:  LOS: 11 days   Looks good POD #1 lap chole. Cardiac stable. On O2 ( is on at home as well). Nl LV fxn with G1DD. I/O inaccurate today (not recorded). He  Was on IV lasix pre op ( ? Not on now, 6400 cc +). Nothing further to add. Will S/O. Call if we can be of further assistance).  Lorretta Harp 08/23/2014, 7:57 AM

## 2014-08-23 NOTE — Progress Notes (Signed)
Patient given discharge instructions, and verbalized an understanding of all discharge instructions.  Patient agrees with discharge plan, and is being discharged in stable medical condition.  Patient given follow up information, as well as information for follow up with [facility].  Patient given transportation via wheelchair.  Durwin Nora RN

## 2014-08-23 NOTE — Plan of Care (Signed)
Problem: Discharge Progression Outcomes Goal: Independent ADLs or Home Health Care Outcome: Adequate for Discharge Patient refusing home health care.  Has home oxygen supplies.

## 2014-08-23 NOTE — Care Management Note (Signed)
    Page 1 of 1   08/23/2014     10:09:22 AM CARE MANAGEMENT NOTE 08/23/2014  Patient:  Jim [name], Jim [name]   Account Number:  [id number]  Date Initiated:  08/12/2014  Documentation initiated by:  [facility]  Subjective/Objective Assessment:   CAP, COPD     Action/Plan:   from home   Anticipated DC Date:  08/24/2014   Anticipated DC Plan:  [place]  CM consult      Choice offered to / List presented to:          [facility] arranged  [place] - 11 Patient Refused      Status of service:  Completed, signed off Medicare Important Message given?  YES (If response is "NO", the following Medicare IM given date fields will be blank) Date Medicare IM given:  08/16/2014 Medicare IM given by:  Gabriel Earing Date Additional Medicare IM given:  08/23/2014 Additional Medicare IM given by:  Sunday Spillers  Discharge Disposition:  HOME/SELF CARE  Per UR Regulation:  Reviewed for med. necessity/level of care/duration of stay  If discussed at [place] of Stay Meetings, dates discussed:    Comments:  08-22-14 Sunday Spillers RN [place] [address] Discussed [facility] services with patient again, he continues to refuse.  08/16/14 MMcGibboney, RN, BSN Spoke with pt concerning [place]. Pt states that he will not need Home Health at this time.

## 2014-08-23 NOTE — Progress Notes (Addendum)
Chronic cholecystitis with calculous s/p lap chole with ioc  Discussed with NP Pt discharged by time I was able to round.  IOC showed probable GS in distal cystic duct. Pt had preop ERCP/sphincterotomy Discussed with GI - follow LFTs as outpt.   [name]. [name], MD, FACS General, Bariatric, & Minimally Invasive Surgery [facility], [place]

## 2014-08-23 NOTE — Progress Notes (Addendum)
[facility] PROGRESS NOTE  MAXIMILLIANO KERSH RWE:[id number] DOB: 1932-01-17 DOA: 08/12/2014 PCP: [name], MD  Brief Summary  Interim summary  80 year old male with past medical history of hypertension, dyslipidemia, diabetes, chronic diastolic CHF, history of prostate cancer who presented to [facility] ED 08/12/2014 with worsening abdominal pain over past 24 hours prior to this admission. Patient explains pain is in the upper and mid abdomen associated with multiple episodes of nonbloody vomiting. In emergency department, the patient was noted to have a lipase >3000, AST 65, ALT 90, ALT 187, total bilirubin 6.5. BNP was 772, WBC 15.6. CT abdomen showed focal midline retroperitoneal inflammation unclear if this is sequela of duodenitis or possible pancreatitis, possible mid to proximal small bowel wall thickening, non-obstructing sub-centimeters distal common bile duct lesion which could reflect polyp or less likely neoplasm, 16 mm solid left lower pole renal mass highly concerning for renal cell carcinoma.  The patient was placed on bowel rest and started on intravenous fluids for his acute pancreatitis. Gastroenterology was consulted. MRCP was performed as well as MRI of the abdomen to clarify his left renal mass. The MRCP revealed a distal common bile duct filling defect concerning for a stone. MRI of the abdomen revealed a suspicious left renal lesion concerning for renal cell carcinoma. Urology has been consulted, and felt due to slow growing nature of these masses, pt can follow up as an outpatient. Gastroenterology plans for ERCP on 08/16/2014. The patient continues on Unasyn for empiric treatment of aspiration pneumonitis and possible cholangitis.  Transferred to stepdown on 9/25 for increasing respiratory distress from pulmonary edema.  Diuresed well and was transferred back to floor where he has been evaluated by cardiology and cleared for surgery.  The patient underwent laparoscopic cholecystectomy  on 9/29.  Assessment/Plan  Acute on chronic respiratory failure,  Improving with diuresis.  Likely multifactorial from COPD, aspiration pneumonia, and acute diastolic heart failure, now back to home oxygen level of 2.5 to 3L.   -  ECHO:  Mild LVH, EF 50-55%, no regional wall motion abnl, grade 1 DD,  -  Continue to Hold lasix -  Continue to wean oxygen -  Continue bronchodilators -  Will try to avoid systemic steroids due to underlying infection -  I/O not strictly recorded, but volume overload and breathing clinically improved   Acute pancreatitis with CBD stone  --LFTs, lipase improved  --MRCP--distant, bowel duct filling defect suspicious for periampullary stone  - ERCP done on 08/16/14 shows Choledocholiathiasis, treated with biliary sphincterotomy and balloon sweeping. - CCS consulted:  cholecystectomy done 9/29  Klebsiella bacteremia, likely seeded from cholangitis/obstructing stone, sensitive to unasyn but not ampicillin -  D/c zosyn.  Patient will receive perioperative abx and that should be sufficient for his infection -  Repeat blood cultures on 9-25 showing no growth to date, continue with by mouth Cipro and Flagyl to finish total of 14 days.  Sepsis: -Patient had fever, leukocytosis, at one point, with PSA lab positive blood cultures, secondary to CBD stones , has been treated with antibiotics as above ,upon discharge facility of sepsis has been resolved.   Renal mass suspicious for RCC based on MRI appearance -Appreciate urology consult-->followup with Dr. Consuella Lose as outpatient   COPD, stable -  Continue nebulized LABA and ICS today - Continue albuterol and Atrovent  -  Completed abx  Hypertension, labile BPs -  Continue carvedilol and losartan  -  Continue bidil -  Continue prn hydralazine  Diabetes mellitus type 2, CBG  well controlled - 08/12/2014 hemoglobin A1c 7.0 -  Cont to hold metformin -  Continue ISS  Hypokalemia due to diuresis -  Continue daily  potassium repletion  Normocytic anemia due to chronic disease and acute illness -  Hemoglobin approximately stable near 10 mg/dl  Diet:  Diabetic diet Access:  PIV IVF:  off Proph:  SCDs   Code Status: full Family Communication: patient alone Disposition Plan:  Home in 24 hours in tolerates oral intake.   Consultants:  General surgery, Dr. Johney Maine  GI, Dr. Carlean Purl  Urology, Dr. Junious Silk  Procedures:  9/20 CXR:  NAD  9/19 CXR:  Scarring, possibly mild pneumonia or atelectasis, mild vascular congestion  9/19 CT abd/pelvis:  Focal midline retroperitoneal inflammation, unclear if this is reflects sequelae duodenitis or possible pancreatitis. Suspected mild proximal small bowel wall thickening. Nonobstructing subcentimeter distal Common bile duct lesion, which could reflect polyp or less likely neoplasm, possible sludge ball. Recommend MRCP on a nonemergent basis. Small hiatal hernia with moderate amount of associated mildly inflamed fat, which is possibly tracking from the retroperitoneum. 16 mm solid LEFT lower pole renal mass highly concerning for renal cell carcinoma  9/21 MRCP:   Filling defect within the distal common duct, highly suspicious for periampullary stone. This is positioned more distally than on 08/12/14 CT. Mild secondary biliary ductal dilatation. 3. 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 4. Progressive moderate pancreatitis  ERCP 08-16-14  The CBD was dilated diffusely up to 78mm and there was a round filling defect in distal bile duct. An adequate biliary sphincterotomy was performed over the biliary wire and then a biliary retrieval balloon was used to sweep the bile ducts. A single 11mm greenish stone was delivered into the duodenum. There was no purulence. A completion, occlusion cholangiogram showed no further filling defects. The main pancreatic duct was never cannulated or injected with  dye  Antibiotics: Levofloxacin 9/19>>9/20  unasyn 08/13/14>>> 9/25 Zosyn 9/25 >> 9/29    HPI/Subjective:  Feeling well.  Has been ambulating around the halls.  Denies SOB, chest pain, nausea, vomiting, diarrhea.    Objective: Filed Vitals:   08/22/14 2016 08/22/14 2040 08/23/14 0200 08/23/14 0544  BP: 140/68  162/73 152/68  Pulse: 73  75 77  Temp: 99.3 F (37.4 C)  98.7 F (37.1 C) 98.9 F (37.2 C)  TempSrc: Oral  Oral Oral  Resp: 18  16 16   Height:      Weight:      SpO2: 94% 95% 98% 94%    Intake/Output Summary (Last 24 hours) at 08/23/14 1329 Last data filed at 08/23/14 0833  Gross per 24 hour  Intake   1640 ml  Output   1225 ml  Net    415 ml   Filed Weights   08/19/14 0500 08/20/14 0515 08/21/14 0457  Weight: 82.8 kg (182 lb 8.7 oz) 81.602 kg (179 lb 14.4 oz) 80.015 kg (176 lb 6.4 oz)    Exam:   General:  WM, No acute distress, 3L [place]  HEENT:  NCAT, MMM  Cardiovascular:  RRR, nl S1, S2 no mrg, 2+ pulses, warm extremities  Respiratory:   CTAB, no increased WOB  Abdomen:   NABS, soft, ND, NT  MSK:   Normal tone and bulk, very minimal trace bilateral LEE   Neuro:  Grossly intact  Data Reviewed: Basic Metabolic Panel:  Recent Labs Lab 08/18/14 0435 08/19/14 0500 08/20/14 0437 08/21/14 0434 08/22/14 0405  NA  138 136* 136* 136* 135*  K 3.7 3.0* 3.3* 3.5* 3.6*  CL 104 99 98 98 97  CO2 21 25 24 24 25   GLUCOSE 161* 132* 123* 126* 122*  BUN 13 13 12 12 11   CREATININE 0.74 0.82 0.90 0.94 0.93  CALCIUM 8.4 8.0* 8.1* 8.1* 8.2*  MG  --  1.9  --   --   --    Liver Function Tests:  Recent Labs Lab 08/18/14 0435 08/19/14 0500 08/20/14 0437 08/21/14 0434 08/22/14 0405  AST 23 19 16 15 17   ALT 21 17 15 14 14   ALKPHOS 130* 100 91 87 86  BILITOT 1.0 1.2 1.1 1.0 0.9  PROT 6.2 5.7* 5.8* 6.2 6.0  ALBUMIN 2.4* 2.2* 2.3* 2.4* 2.6*    Recent Labs Lab 08/17/14 0507 08/18/14 0435 08/19/14 0500  LIPASE 65* 91* 74*   No results found for this  basename: AMMONIA,  in the last 168 hours CBC:  Recent Labs Lab 08/19/14 0500 08/20/14 0437 08/21/14 0434 08/22/14 0405 08/23/14 0445  WBC 9.1 10.5 12.1* 11.2* 11.1*  HGB 10.8* 10.6* 10.2* 10.4* 11.4*  HCT 30.3* 30.4* 29.8* 30.7* 33.7*  MCV 90.7 91.3 93.1 92.5 93.4  PLT 195 244 237 289 346   Cardiac Enzymes: No results found for this basename: CKTOTAL, CKMB, CKMBINDEX, TROPONINI,  in the last 168 hours BNP (last 3 results)  Recent Labs  08/12/14 0408  PROBNP 772.2*   CBG:  Recent Labs Lab 08/22/14 2018 08/23/14 0027 08/23/14 0434 08/23/14 0721 08/23/14 1148  GLUCAP 196* 146* 137* 141* 230*    Recent Results (from the past 240 hour(s))  URINE CULTURE     Status: None   Collection Time    08/13/14  1:58 PM      Result Value Ref Range Status   Specimen Description URINE, CATHETERIZED   Final   Special Requests NONE   Final   Culture  Setup Time     Final   Value: 08/13/2014 19:11     Performed at [facility]     Final   Value: NO GROWTH     Performed at [facility]   Culture     Final   Value: NO GROWTH     Performed at [facility]   Report Status 08/14/2014 FINAL   Final  SURGICAL PCR SCREEN     Status: Abnormal   Collection Time    08/18/14  1:05 AM      Result Value Ref Range Status   MRSA, PCR INVALID RESULTS, SPECIMEN SENT FOR CULTURE (*) NEGATIVE Final   Comment: RESULT CALLED TO, READ BACK BY AND VERIFIED WITH:     V.STEWART,RN AT 0303 ON 08/18/14 BY SHEAW   Staphylococcus aureus INVALID RESULTS, SPECIMEN SENT FOR CULTURE (*) NEGATIVE Final   Comment: RESULT CALLED TO, READ BACK BY AND VERIFIED WITH:     V.STEWART,RN AT 0303 ON 08/18/14 BY SHEAW                The Xpert SA Assay (FDA     approved for NASAL specimens     in patients over 78 years of age),     is one component of     a comprehensive surveillance     program.  Test performance has     been validated by [employer] for patients  greater     than or equal to 52 year old.  It is not intended     to diagnose infection nor to     guide or monitor treatment.  MRSA CULTURE     Status: None   Collection Time    08/18/14  1:05 AM      Result Value Ref Range Status   Specimen Description NOSE   Final   Special Requests NONE   Final   Culture     Final   Value: NO STAPHYLOCOCCUS AUREUS ISOLATED     Note: No MRSA Isolated     Performed at [facility]   Report Status 08/20/2014 FINAL   Final  CULTURE, BLOOD (ROUTINE X 2)     Status: None   Collection Time    08/18/14  8:31 AM      Result Value Ref Range Status   Specimen Description BLOOD LEFT HAND   Final   Special Requests BOTTLES DRAWN AEROBIC AND ANAEROBIC 10CC   Final   Culture  Setup Time     Final   Value: 08/18/2014 11:06     Performed at [facility]   Culture     Final   Value:        BLOOD CULTURE RECEIVED NO GROWTH TO DATE CULTURE WILL BE HELD FOR 5 DAYS BEFORE ISSUING A FINAL NEGATIVE REPORT     Performed at [facility]   Report Status PENDING   Incomplete  CULTURE, BLOOD (ROUTINE X 2)     Status: None   Collection Time    08/18/14  8:38 AM      Result Value Ref Range Status   Specimen Description BLOOD RIGHT ARM   Final   Special Requests BOTTLES DRAWN AEROBIC AND ANAEROBIC 10CC   Final   Culture  Setup Time     Final   Value: 08/18/2014 11:06     Performed at [facility]   Culture     Final   Value:        BLOOD CULTURE RECEIVED NO GROWTH TO DATE CULTURE WILL BE HELD FOR 5 DAYS BEFORE ISSUING A FINAL NEGATIVE REPORT     Performed at [facility]   Report Status PENDING   Incomplete  SURGICAL PCR SCREEN     Status: None   Collection Time    08/22/14  7:39 AM      Result Value Ref Range Status   MRSA, PCR NEGATIVE  NEGATIVE Final   Comment: DELTA CHECK NOTED   Staphylococcus aureus NEGATIVE  NEGATIVE Final   Comment:            The Xpert SA Assay (FDA     approved for NASAL specimens     in  patients over 69 years of age),     is one component of     a comprehensive surveillance     program.  Test performance has     been validated by [employer] for patients greater     than or equal to 58 year old.     It is not intended     to diagnose infection nor to     guide or monitor treatment.     DELTA CHECK NOTED     Studies: Dg Cholangiogram Operative  08/22/2014   CLINICAL DATA:  Laparoscopic cholecystectomy.  EXAM: INTRAOPERATIVE CHOLANGIOGRAM  FLUOROSCOPY TIME:  20 seconds  COMPARISON:  ERCP - 08/16/2014; MRCP - 08/14/2014  FINDINGS: Intraoperative angiographic images of the right upper  abdominal quadrant during laparoscopic cholecystectomy are provided for review.  Surgical clips overlie the expected location of the gallbladder fossa.  Contrast injection demonstrates selective cannulation of the central aspect of the cystic duct.  There is passage of contrast through the central aspect of the cystic duct with filling of a non dilated common bile duct. There is passage of contrast though the CBD and into the descending portion of the duodenum. 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  There is minimal reflux of injected contrast into the common hepatic duct and central aspect of the non dilated intrahepatic biliary system.  There is a persistent nonocclusive filling defect within the central aspect of the cystic duct.  IMPRESSION: 1. Persistent nonocclusive filling defect in the central aspect of the cystic duct, possibly an air bubble though choledocholithiasis could have a similar appearance. Correlation with the operative report is recommended. 2. 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   Electronically Signed   By: Sandi Mariscal M.D.   On: 08/22/2014 14:41    Scheduled Meds: . antiseptic oral rinse  7 mL Mouth Rinse q12n4p  . arformoterol  15 mcg Nebulization BID  .  aspirin EC  81 mg Oral Daily  . atorvastatin  20 mg Oral q1800  . budesonide (PULMICORT) nebulizer solution  0.25 mg Nebulization BID  . carvedilol  25 mg Oral BID WC  . chlorhexidine  15 mL Mouth Rinse BID  . enoxaparin (LOVENOX) injection  40 mg Subcutaneous Q24H  . guaiFENesin  600 mg Oral Daily  . insulin aspart  0-9 Units Subcutaneous 6 times per day  . lip balm  1 application Topical BID  . losartan  50 mg Oral Daily  . potassium chloride  40 mEq Oral Daily  . saccharomyces boulardii  250 mg Oral BID  . sodium chloride  3 mL Intravenous Q12H   Continuous Infusions:    Principal Problem:   Acute respiratory failure with hypoxia Active Problems:   HYPERLIPIDEMIA   HYPERTENSION   COPD (chronic obstructive pulmonary disease)   Acute pancreatitis   CAP (community acquired pneumonia)   Leukocytosis   Abnormal LFTs   Renal mass   Type II or unspecified type diabetes mellitus without mention of complication, not stated as uncontrolled   Nonspecific (abnormal) findings on radiological and other examination of biliary tract   Acute-on-chronic respiratory failure   Aspiration pneumonitis   Choledocholithiasis   Acute on chronic diastolic heart failure   Preoperative clearance    Time spent: 30 min    Kadeen Sroka  [facility] Pager [phone]. If 7PM-7AM, please contact night-coverage at [email or web address], password [facility] 08/23/2014, 1:29 PM  LOS: 11 days

## 2014-08-23 NOTE — Progress Notes (Signed)
Occupational Therapy Treatment Patient Details Name: CALEN GEISTER MRN: [id number] DOB: 1932-11-22 Today's Date: 08/23/2014    History of present illness LAPAROSCOPIC CHOLECYSTECTOMY WITH INTRAOPERATIVE CHOLANGIOGRAM (N/A) 9/29   OT comments  Pt did well and had good safety awareness  Follow Up Recommendations  No OT follow up;Supervision - Intermittent    Equipment Recommendations       Recommendations for Other Services      Precautions / Restrictions Precautions Precaution Comments: home O2 Restrictions Weight Bearing Restrictions: No       Mobility Bed Mobility Overal bed mobility: Modified Independent                Transfers Overall transfer level: Needs assistance   Transfers: Sit to/from Stand;Stand Pivot Transfers Sit to Stand: Supervision Stand pivot transfers: Supervision                ADL           Upper Body Bathing: Supervision/ safety;Sitting   Lower Body Bathing: Supervison/ safety;Sit to/from stand   Upper Body Dressing : Supervision/safety;Sitting   Lower Body Dressing: Supervision/safety;Sit to/from stand   Toilet Transfer: Supervision/safety;Ambulation   Toileting- Clothing Manipulation and Hygiene: Supervision/safety;Sit to/from stand         General ADL Comments: pt performed bathing from sitting position.  Pt did well and hopes to go home tomorrow                Cognition   Behavior During Therapy: [facility] for tasks assessed/performed Overall Cognitive Status: Within Functional Limits for tasks assessed (poor insight/awareness into deficits)                               General Comments              Progress Toward Goals  OT Goals(current goals can now be found in the care plan section)  Progress towards OT goals: Progressing toward goals     Plan Discharge plan remains appropriate    Co-evaluation                 End of Session Equipment Utilized During Treatment:  Oxygen   Activity Tolerance Patient tolerated treatment well   Patient Left in chair;with call bell/phone within reach   Nurse Communication Mobility status        Time: 3817-7116 OT Time Calculation (min): 27 min  Charges: OT General Charges $OT Visit: 1 Procedure OT Treatments $Self Care/Home Management : 23-37 mins  Kameah Rawl D 08/23/2014, 10:50 AM

## 2014-08-23 NOTE — Progress Notes (Signed)
Patient ID: Jim [name], male   DOB: 1932/04/02, 80 y.o.   MRN: [id number]     [address] [place], [place] [address]    Phone: [phone] FAX: [phone]     Subjective: Doesn't like fulls, had a BM yesterday. Passing flatus.  VSS.  Afebrile.  Voiding.   Objective:  Vital signs:  Filed Vitals:   08/22/14 2016 08/22/14 2040 08/23/14 0200 08/23/14 0544  BP: 140/68  162/73 152/68  Pulse: 73  75 77  Temp: 99.3 F (37.4 C)  98.7 F (37.1 C) 98.9 F (37.2 C)  TempSrc: Oral  Oral Oral  Resp: _0 Height:      Weight:      SpO2: 94% 95% 98% 94%    Last BM Date: 08/22/14  Intake/Output   Yesterday:  09/29 0701 - 09/30 0700 In: 1400 [I.V.:1400] Out: 1550 [Urine:1550] This shift:  Total I/O In: 240 [P.O.:240] Out: 0   Physical Exam:  General: Pt awake/alert/oriented x4 in no acute distress  Chest: cta. No chest wall pain w good excursion. Good IS use ~1270m  CV: Pulses intact. Regular rhythm  Abdomen: Soft. Nondistended. Appropriately tender.  Incisions are c/d/i.  No evidence of peritonitis. No incarcerated hernias.  Ext: SCDs BLE. No mjr edema. No cyanosis  Skin: No petechiae / purpura   Problem List:   Principal Problem:   Acute respiratory failure with hypoxia Active Problems:   HYPERLIPIDEMIA   HYPERTENSION   COPD (chronic obstructive pulmonary disease)   Acute pancreatitis   CAP (community acquired pneumonia)   Leukocytosis   Abnormal LFTs   Renal mass   Type II or unspecified type diabetes mellitus without mention of complication, not stated as uncontrolled   Nonspecific (abnormal) findings on radiological and other examination of biliary tract   Acute-on-chronic respiratory failure   Aspiration pneumonitis   Choledocholithiasis   Acute on chronic diastolic heart failure   Preoperative clearance    Results:   Labs: Results for orders placed during the hospital  encounter of 08/12/14 (from the past 48 hour(s))  GLUCOSE, CAPILLARY     Status: Abnormal   Collection Time    08/21/14  4:52 PM      Result Value Ref Range   Glucose-Capillary 186 (*) 70 - 99 mg/dL  GLUCOSE, CAPILLARY     Status: Abnormal   Collection Time    08/21/14  9:19 PM      Result Value Ref Range   Glucose-Capillary 126 (*) 70 - 99 mg/dL  GLUCOSE, CAPILLARY     Status: Abnormal   Collection Time    08/22/14  3:32 AM      Result Value Ref Range   Glucose-Capillary 124 (*) 70 - 99 mg/dL  CBC     Status: Abnormal   Collection Time    08/22/14  4:05 AM      Result Value Ref Range   WBC 11.2 (*) 4.0 - 10.5 K/uL   RBC 3.32 (*) 4.22 - 5.81 MIL/uL   Hemoglobin 10.4 (*) 13.0 - 17.0 g/dL   HCT 30.7 (*) 39.0 - 52.0 %   MCV 92.5  78.0 - 100.0 fL   MCH 31.3  26.0 - 34.0 pg   MCHC 33.9  30.0 - 36.0 g/dL   RDW 13.0  11.5 - 15.5 %   Platelets 289  150 - 400 K/uL  COMPREHENSIVE METABOLIC PANEL  Status: Abnormal   Collection Time    08/22/14  4:05 AM      Result Value Ref Range   Sodium 135 (*) 137 - 147 mEq/L   Potassium 3.6 (*) 3.7 - 5.3 mEq/L   Chloride 97  96 - 112 mEq/L   CO2 25  19 - 32 mEq/L   Glucose, Bld 122 (*) 70 - 99 mg/dL   BUN 11  6 - 23 mg/dL   Creatinine, Ser 0.93  0.50 - 1.35 mg/dL   Calcium 8.2 (*) 8.4 - 10.5 mg/dL   Total Protein 6.0  6.0 - 8.3 g/dL   Albumin 2.6 (*) 3.5 - 5.2 g/dL   AST 17  0 - 37 U/L   ALT 14  0 - 53 U/L   Alkaline Phosphatase 86  39 - 117 U/L   Total Bilirubin 0.9  0.3 - 1.2 mg/dL   GFR calc non Af Amer 77 (*) >90 mL/min   GFR calc Af Amer 90 (*) >90 mL/min   Comment: (NOTE)     The eGFR has been calculated using the CKD EPI equation.     This calculation has not been validated in all clinical situations.     eGFR's persistently <90 mL/min signify possible Chronic Kidney     Disease.   Anion gap 13  5 - 15  SURGICAL PCR SCREEN     Status: None   Collection Time    08/22/14  7:39 AM      Result Value Ref Range   MRSA, PCR  NEGATIVE  NEGATIVE   Comment: DELTA CHECK NOTED   Staphylococcus aureus NEGATIVE  NEGATIVE   Comment:            The Xpert SA Assay (FDA     approved for NASAL specimens     in patients over 78 years of age),     is one component of     a comprehensive surveillance     program.  Test performance has     been validated by [employer] for patients greater     than or equal to 38 year old.     It is not intended     to diagnose infection nor to     guide or monitor treatment.     DELTA CHECK NOTED  GLUCOSE, CAPILLARY     Status: Abnormal   Collection Time    08/22/14  7:54 AM      Result Value Ref Range   Glucose-Capillary 137 (*) 70 - 99 mg/dL  GLUCOSE, CAPILLARY     Status: Abnormal   Collection Time    08/22/14 11:30 AM      Result Value Ref Range   Glucose-Capillary 150 (*) 70 - 99 mg/dL  ABO/RH     Status: None   Collection Time    08/22/14 12:20 PM      Result Value Ref Range   ABO/RH(D) O POS    TYPE AND SCREEN     Status: None   Collection Time    08/22/14 12:57 PM      Result Value Ref Range   ABO/RH(D) O POS     Antibody Screen NEG     Sample Expiration 08/25/2014    GLUCOSE, CAPILLARY     Status: Abnormal   Collection Time    08/22/14  2:12 PM      Result Value Ref Range   Glucose-Capillary 147 (*) 70 - 99  mg/dL   Comment 1 Documented in Chart     Comment 2 Notify RN    GLUCOSE, CAPILLARY     Status: Abnormal   Collection Time    08/22/14  4:57 PM      Result Value Ref Range   Glucose-Capillary 160 (*) 70 - 99 mg/dL  GLUCOSE, CAPILLARY     Status: Abnormal   Collection Time    08/22/14  8:18 PM      Result Value Ref Range   Glucose-Capillary 196 (*) 70 - 99 mg/dL  GLUCOSE, CAPILLARY     Status: Abnormal   Collection Time    08/23/14 12:27 AM      Result Value Ref Range   Glucose-Capillary 146 (*) 70 - 99 mg/dL  GLUCOSE, CAPILLARY     Status: Abnormal   Collection Time    08/23/14  4:34 AM      Result Value Ref Range   Glucose-Capillary 137  (*) 70 - 99 mg/dL  CBC     Status: Abnormal   Collection Time    08/23/14  4:45 AM      Result Value Ref Range   WBC 11.1 (*) 4.0 - 10.5 K/uL   RBC 3.61 (*) 4.22 - 5.81 MIL/uL   Hemoglobin 11.4 (*) 13.0 - 17.0 g/dL   HCT 33.7 (*) 39.0 - 52.0 %   MCV 93.4  78.0 - 100.0 fL   MCH 31.6  26.0 - 34.0 pg   MCHC 33.8  30.0 - 36.0 g/dL   RDW 12.7  11.5 - 15.5 %   Platelets 346  150 - 400 K/uL  GLUCOSE, CAPILLARY     Status: Abnormal   Collection Time    08/23/14  7:21 AM      Result Value Ref Range   Glucose-Capillary 141 (*) 70 - 99 mg/dL  GLUCOSE, CAPILLARY     Status: Abnormal   Collection Time    08/23/14 11:48 AM      Result Value Ref Range   Glucose-Capillary 230 (*) 70 - 99 mg/dL    Imaging / Studies: Dg Cholangiogram Operative  08/22/2014   CLINICAL DATA:  Laparoscopic cholecystectomy.  EXAM: INTRAOPERATIVE CHOLANGIOGRAM  FLUOROSCOPY TIME:  20 seconds  COMPARISON:  ERCP - 08/16/2014; MRCP - 08/14/2014  FINDINGS: Intraoperative angiographic images of the right upper abdominal quadrant during laparoscopic cholecystectomy are provided for review.  Surgical clips overlie the expected location of the gallbladder fossa.  Contrast injection demonstrates selective cannulation of the central aspect of the cystic duct.  There is passage of contrast through the central aspect of the cystic duct with filling of a non dilated common bile duct. There is passage of contrast though the CBD and into the descending portion of the duodenum. 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  There is minimal reflux of injected contrast into the common hepatic duct and central aspect of the non dilated intrahepatic biliary system.  There is a persistent nonocclusive filling defect within the central aspect of the cystic duct.  IMPRESSION: 1. Persistent nonocclusive filling defect in the central aspect of the cystic duct, possibly an air bubble though choledocholithiasis  could have a similar appearance. Correlation with the operative report is recommended. 2. 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   Electronically Signed   By: Sandi Mariscal M.D.   On: 08/22/2014 14:41    Medications /  Allergies:  Scheduled Meds: . antiseptic oral rinse  7 mL Mouth Rinse q12n4p  . arformoterol  15 mcg Nebulization BID  . aspirin EC  81 mg Oral Daily  . atorvastatin  20 mg Oral q1800  . budesonide (PULMICORT) nebulizer solution  0.25 mg Nebulization BID  . carvedilol  25 mg Oral BID WC  . chlorhexidine  15 mL Mouth Rinse BID  . ciprofloxacin  500 mg Oral BID  . enoxaparin (LOVENOX) injection  40 mg Subcutaneous Q24H  . guaiFENesin  600 mg Oral Daily  . insulin aspart  0-9 Units Subcutaneous 6 times per day  . lactobacillus acidophilus  2 tablet Oral TID  . lip balm  1 application Topical BID  . losartan  50 mg Oral Daily  . metroNIDAZOLE  500 mg Oral 3 times per day  . potassium chloride  40 mEq Oral Daily  . saccharomyces boulardii  250 mg Oral BID  . sodium chloride  3 mL Intravenous Q12H   Continuous Infusions:  PRN Meds:.acetaminophen, acetaminophen, albuterol, albuterol, alum & mag hydroxide-simeth, bisacodyl, hydrALAZINE, ipratropium-albuterol, magic mouthwash, menthol-cetylpyridinium, morphine injection, ondansetron (ZOFRAN) IV, oxyCODONE-acetaminophen, phenol, polyethylene glycol, promethazine  Antibiotics: Anti-infectives   Start     Dose/Rate Route Frequency Ordered Stop   08/23/14 1500  ciprofloxacin (CIPRO) tablet 500 mg     500 mg Oral 2 times daily 08/23/14 1343     08/23/14 1500  metroNIDAZOLE (FLAGYL) tablet 500 mg     500 mg Oral 3 times per day 08/23/14 1343     08/18/14 0930  piperacillin-tazobactam (ZOSYN) IVPB 3.375 g  Status:  Discontinued     3.375 g 12.5 mL/hr over 240 Minutes Intravenous 3 times per day 08/18/14 0915 08/22/14 1149   08/18/14 0600  ampicillin-sulbactam (UNASYN)  1.5 g in sodium chloride 0.9 % 50 mL IVPB  Status:  Discontinued     1.5 g 100 mL/hr over 30 Minutes Intravenous On call to O.R. 08/17/14 1841 08/18/14 0800   08/13/14 1200  ampicillin-sulbactam (UNASYN) 1.5 g in sodium chloride 0.9 % 50 mL IVPB  Status:  Discontinued     1.5 g 100 mL/hr over 30 Minutes Intravenous 4 times per day 08/13/14 1104 08/18/14 0800   08/12/14 0800  levofloxacin (LEVAQUIN) IVPB 750 mg  Status:  Discontinued     750 mg 100 mL/hr over 90 Minutes Intravenous Every 24 hours 08/12/14 0746 08/13/14 1104        Assessment/Plan Acute on chronic respiratory failure  COPD  Acute pancreatitis  Choledocholithiasis s/p ERCP  Klebsiella bacteremia  DM  Acute on chronic heart failure POD#1 laparoscopic cholecystectomy--Dr. [name]  -Advance diet -Mobilize -IS  -Antibiotics per primary team -Discharge when medically stable, stable from surgical standpoint.  Erby Pian, [facility] Pager [phone])   08/23/2014 2:39 PM

## 2014-08-23 NOTE — Progress Notes (Signed)
Physical Therapy Treatment Patient Details Name: Jim [name] MRN: [id number] DOB: [birth year] Today's Date: 08/23/2014    History of Present Illness LAPAROSCOPIC CHOLECYSTECTOMY WITH INTRAOPERATIVE CHOLANGIOGRAM (N/A) 9/29    PT Comments    Pt stated he was feeling "good".  No ABD pain from his surgery yesterday. Tolerated session well and hopeful to D/C to home tomorrow.  Follow Up Recommendations  Home health PT     Equipment Recommendations       Recommendations for Other Services       Precautions / Restrictions      Mobility  Bed Mobility Overal bed mobility: Modified Independent                Transfers Overall transfer level: Needs assistance   Transfers: Sit to/from Stand;Stand Pivot Transfers Sit to Stand: Supervision Stand pivot transfers: Supervision          Ambulation/Gait Ambulation/Gait assistance: Supervision;Min guard Ambulation Distance (Feet): 350 Feet Assistive device: None Gait Pattern/deviations: Step-through pattern Gait velocity: WFL   General Gait Details: amb on RA sats avg 90%   Stairs            Wheelchair Mobility    Modified Rankin (Stroke Patients Only)       Balance                                    Cognition Arousal/Alertness: Awake/alert Behavior During Therapy: WFL for tasks assessed/performed Overall Cognitive Status: Within Functional Limits for tasks assessed (poor insight/awareness into deficits)                      Exercises      General Comments        Pertinent Vitals/Pain      Home Living                      Prior Function            PT Goals (current goals can now be found in the care plan section) Progress towards PT goals: Progressing toward goals    Frequency  Min 3X/week    PT Plan      Co-evaluation             End of Session Equipment Utilized During Treatment: Gait belt Activity Tolerance: Patient tolerated  treatment well Patient left: in chair;with call bell/phone within reach     Time: 1015-1040 PT Time Calculation (min): 25 min  Charges:  $Gait Training: 8-22 mins $Therapeutic Activity: 8-22 mins                    G Codes:      Rica Koyanagi  PTA [facility]  Acute  Rehab Pager      [phone]

## 2014-08-24 ENCOUNTER — Telehealth: Payer: Self-pay

## 2014-08-24 LAB — CULTURE, BLOOD (ROUTINE X 2)
CULTURE: NO GROWTH
Culture: NO GROWTH

## 2014-08-24 NOTE — Telephone Encounter (Signed)
Patient returned phone call. °

## 2014-08-24 NOTE — Telephone Encounter (Signed)
Spoke with the patient. He reports he is without concern presently. Aware of his follow up appointments. States he was given the information before he left the hospital. Advised him a letter has been mailed from [place] about his follow up appointment also.

## 2014-08-24 NOTE — Telephone Encounter (Addendum)
Admission date: 08/12/2014  Discharge Date:  08/23/2014  Reason for admission:  Acute pancreatitis   Transition Care Management Follow-up Telephone Call  How have you been since you were released from the hospital? Pt states that he feels much better.  No nausea, vomiting, or abdominal pain.  States currently he's not having any symptoms.     Do you understand why you were in the hospital? yes   Do you understand the discharge instrcutions? yes  Items Reviewed:  Medications reviewed: yes  Allergies reviewed: yes  Dietary changes reviewed: yes  Referrals reviewed: yes, urology--no appointment yet   Functional Questionnaire:   Activities of Daily Living (ADLs):   He states they are independent in the following:  ambulation, bathing and hygiene, feeding, continence, grooming, toileting and dressing States they require assistance with the following: none   Any transportation issues/concerns?: no   Any patient concerns? no   Confirmed importance and date/time of follow-up visits scheduled: yes   Confirmed with patient if condition begins to worsen call PCP or go to the ER.  Patient was given the Call-a-Nurse line [phone]: yes   Hospital follow up appointment scheduled on 08/28/14 @ 11:30 with Dr. Larose Kells.

## 2014-08-25 ENCOUNTER — Encounter (INDEPENDENT_AMBULATORY_CARE_PROVIDER_SITE_OTHER): Payer: Medicare Other | Admitting: Ophthalmology

## 2014-08-28 ENCOUNTER — Encounter: Payer: Self-pay | Admitting: Internal Medicine

## 2014-08-28 ENCOUNTER — Ambulatory Visit (INDEPENDENT_AMBULATORY_CARE_PROVIDER_SITE_OTHER): Payer: Medicare Other | Admitting: Internal Medicine

## 2014-08-28 VITALS — BP 155/73 | HR 79 | Temp 98.1°F | Wt 166.5 lb

## 2014-08-28 DIAGNOSIS — I10 Essential (primary) hypertension: Secondary | ICD-10-CM | POA: Diagnosis not present

## 2014-08-28 DIAGNOSIS — K851 Biliary acute pancreatitis without necrosis or infection: Secondary | ICD-10-CM

## 2014-08-28 DIAGNOSIS — E119 Type 2 diabetes mellitus without complications: Secondary | ICD-10-CM

## 2014-08-28 DIAGNOSIS — N2889 Other specified disorders of kidney and ureter: Secondary | ICD-10-CM

## 2014-08-28 DIAGNOSIS — K7589 Other specified inflammatory liver diseases: Secondary | ICD-10-CM | POA: Diagnosis not present

## 2014-08-28 DIAGNOSIS — J439 Emphysema, unspecified: Secondary | ICD-10-CM

## 2014-08-28 LAB — HEPATIC FUNCTION PANEL
ALT: 21 U/L (ref 0–53)
AST: 18 U/L (ref 0–37)
Albumin: 3.1 g/dL — ABNORMAL LOW (ref 3.5–5.2)
Alkaline Phosphatase: 79 U/L (ref 39–117)
BILIRUBIN DIRECT: 0.2 mg/dL (ref 0.0–0.3)
BILIRUBIN TOTAL: 0.9 mg/dL (ref 0.2–1.2)
Total Protein: 7.3 g/dL (ref 6.0–8.3)

## 2014-08-28 LAB — AMYLASE: AMYLASE: 72 U/L (ref 27–131)

## 2014-08-28 LAB — LIPASE: LIPASE: 38 U/L (ref 11.0–59.0)

## 2014-08-28 NOTE — Patient Instructions (Signed)
Get your blood work before you leave   Please come back to the office 4-5 weeks  for a routine check up , no fasting      REMINDERS It is extremely important to know what medications you take, please bring all bottles with you (or an accurate medication list) for every visit  If we are ordering labs, XRs or referring you to a specialist : we will always communicate to you the results or the time of the appointment within few days. If you don't hear from [place] please call the office

## 2014-08-28 NOTE — Progress Notes (Signed)
Subjective:    Patient ID: Jim [name], male    DOB: 1932/03/30, 80 y.o.   MRN: [id number]  DOS:  08/28/2014 Type of visit - description : Hospital followup, chart reviewed, excerpts below: Interval history:  Admission date: 08/12/2014  Discharge Date 08/23/2014    Acute on chronic respiratory failure, Improving with diuresis. Likely multifactorial from COPD, aspiration pneumonia, and acute diastolic heart failure, now back to home oxygen level of 2.5 to 3L.  - ECHO: Mild LVH, EF 50-55%, no regional wall motion abnl, grade 1 DD,  - Continue to Hold lasix on discharge.  - Continue bronchodilators  Acute pancreatitis with CBD stone  --LFTs, lipase improved  --MRCP--distant, bowel duct filling defect suspicious for periampullary stone  - ERCP done on 08/16/14 shows Choledocholiathiasis, treated with biliary sphincterotomy and balloon sweeping.  - [facility] consulted: cholecystectomy done 9/29  Klebsiella bacteremia, likely seeded from cholangitis/obstructing stone, sensitive to unasyn but not ampicillin  - D/c zosyn. Patient will receive perioperative abx and that should be sufficient for his infection  - Repeat blood cultures on 9-25 showing no growth to date, continue with by mouth Cipro and Flagyl to finish total of 14 days. (Another 5 days after discharge)  Renal mass suspicious for RCC based on MRI appearance  -Appreciate urology consult-->followup with Dr. Consuella Lose as outpatient  Hypertension, labile BPs  - Continue carvedilol and losartan  - Continue bidil  Diabetes mellitus type 2, CBG well controlled  - 08/12/2014 hemoglobin A1c 7.0  - Cont metformin upon    Levofloxacin 9/19>>9/20  unasyn 08/13/14>>> 9/25  Zosyn 9/25 >> 9/29  Oral Cipro and Flagyl started the day of discharge 9/30 to continue to 10/4    ROS Since he left the hospital, his taking his medication correctly and improving in general, still feels really weak and has not been back to take walks. Appetite is good,  reports good po tolerance. BMs are regular He does have mild ankle edema. Respiration is back to baseline ( some cough and the sputum production, no hemoptysis)  Past Medical History  Diagnosis Date  . Type 2 diabetes mellitus 10/2009  . COPD (chronic obstructive pulmonary disease)   . Syncope 2010    Evaluated by Dr. Caryl Comes - normal LVEF, possibly neurally mediated  . Hyperlipidemia   . Essential hypertension, benign   . Prostate cancer 2008    s/p XRT  . ED (erectile dysfunction)   . Cholestatic hepatitis 2014  . Personal history of colonic polyps     Past Surgical History  Procedure Laterality Date  . Cataract extraction Bilateral   . Eye surgery Left 07/17/2014  . Colonoscopy    . Ercp N/A 08/16/2014    Procedure: ENDOSCOPIC RETROGRADE CHOLANGIOPANCREATOGRAPHY (ERCP);  Surgeon: Milus Banister, MD;  Location: [facility];  Service: Endoscopy;  Laterality: N/A;  . Cholecystectomy N/A 08/22/2014    Procedure: LAPAROSCOPIC CHOLECYSTECTOMY WITH INTRAOPERATIVE CHOLANGIOGRAM;  Surgeon: Gayland Curry, MD;  Location: [facility];  Service: General;  Laterality: N/A;    History   Social History  . Marital Status: Divorced    Spouse Name: N/A    Number of Children: 2  . Years of Education: N/A   Occupational History  . Retired     Social History Main Topics  . Smoking status: Former Smoker -- 2.00 packs/day for 40 years    Types: Cigarettes    Quit date: 11/24/2005  . Smokeless tobacco: Never Used  . Alcohol Use: 0.0 oz/week  Comment: Rarely  . Drug Use: No  . Sexual Activity: Not on file   Other Topics Concern  . Not on file   Social History Narrative   Independent on ADL    Lives with son.   Has Gk and GGk        Medication List       This list is accurate as of: 08/28/14 11:49 AM.  Always use your most recent med list.               albuterol (2.5 MG/3ML) 0.083% nebulizer solution  Commonly known as:  PROVENTIL  Take 3 mLs (2.5 mg total) by nebulization 4  (four) times daily as needed.     aspirin 81 MG tablet  Take 81 mg by mouth daily.     atorvastatin 20 MG tablet  Commonly known as:  LIPITOR  take 1 tablet by mouth once daily     carvedilol 25 MG tablet  Commonly known as:  COREG  take 1 tablet by mouth twice a day with meals     COMBIVENT RESPIMAT 20-100 MCG/ACT Aers respimat  Generic drug:  Ipratropium-Albuterol  Inhale 1 puff into the lungs every 6 (six) hours as needed for wheezing.     lactobacillus acidophilus Tabs tablet  Take 2 tablets by mouth 3 (three) times daily.     losartan 100 MG tablet  Commonly known as:  COZAAR  take 1 tablet by mouth once daily     metFORMIN 500 MG tablet  Commonly known as:  GLUCOPHAGE  take 1 tablet by mouth twice a day with meals     PRESERVISION AREDS PO  Take 2 tablets by mouth daily.     SYMBICORT 160-4.5 MCG/ACT inhaler  Generic drug:  budesonide-formoterol  inhale 2 puffs by mouth twice a day           Objective:   Physical Exam BP 155/73  Pulse 79  Temp(Src) 98.1 F (36.7 C) (Oral)  Wt 166 lb 8 oz (75.524 kg)  SpO2 90%  General -- alert, well-developed, frail elderly male, non toxic appearing.    HEENT-- Jaundice ?.   Lungs -- normal respiratory effort, no intercostal retractions, no accessory muscle use, and Decreased breath sounds, no wheezing. Heart-- normal rate, regular rhythm, no murmur.  Abdomen-- Not distended, good bowel sounds,soft, non-tender.  Extremities-- trace pretibial edema bilaterally  Neurologic--  alert & oriented X3. Speech normal, gait is slower than before Psych-- Cognition and judgment appear intact. Cooperative with normal attention span and concentration. No anxious or depressed appearing.       Assessment & Plan:   Acute on chronic respiratory failure, seems to be improving  Acute pancreatitis due to to a CBD stone, status post surgery, feels well. He has mild postop anemia which will be recheck on return to the office.    Jaundice? Will check LFTs, amylase and lipase  Bacteremia, finishing antibiotics  Renal mass, states he called urology and has an appointment for outpatient eval.  Hypertension, BP slightly elevated today, we'll recheck on return to the office  Diabetes, back on metformin,  last renal function within normal

## 2014-08-28 NOTE — Progress Notes (Signed)
Pre visit review using our clinic review tool, if applicable. No additional management support is needed unless otherwise documented below in the visit note. 

## 2014-09-14 ENCOUNTER — Encounter: Payer: Medicare Other | Admitting: Internal Medicine

## 2014-09-15 ENCOUNTER — Encounter (INDEPENDENT_AMBULATORY_CARE_PROVIDER_SITE_OTHER): Payer: Medicare Other | Admitting: Ophthalmology

## 2014-09-15 DIAGNOSIS — E11319 Type 2 diabetes mellitus with unspecified diabetic retinopathy without macular edema: Secondary | ICD-10-CM | POA: Diagnosis not present

## 2014-09-15 DIAGNOSIS — E11329 Type 2 diabetes mellitus with mild nonproliferative diabetic retinopathy without macular edema: Secondary | ICD-10-CM | POA: Diagnosis not present

## 2014-09-15 DIAGNOSIS — I10 Essential (primary) hypertension: Secondary | ICD-10-CM | POA: Diagnosis not present

## 2014-09-15 DIAGNOSIS — H3532 Exudative age-related macular degeneration: Secondary | ICD-10-CM | POA: Diagnosis not present

## 2014-09-15 DIAGNOSIS — H35033 Hypertensive retinopathy, bilateral: Secondary | ICD-10-CM

## 2014-09-17 ENCOUNTER — Other Ambulatory Visit: Payer: Self-pay | Admitting: Internal Medicine

## 2014-09-18 DIAGNOSIS — D495 Neoplasm of unspecified behavior of other genitourinary organs: Secondary | ICD-10-CM | POA: Diagnosis not present

## 2014-09-18 DIAGNOSIS — Q6100 Congenital renal cyst, unspecified: Secondary | ICD-10-CM | POA: Diagnosis not present

## 2014-10-02 ENCOUNTER — Ambulatory Visit (INDEPENDENT_AMBULATORY_CARE_PROVIDER_SITE_OTHER): Payer: Medicare Other | Admitting: Internal Medicine

## 2014-10-02 ENCOUNTER — Encounter: Payer: Self-pay | Admitting: Internal Medicine

## 2014-10-02 VITALS — BP 199/93 | HR 75 | Temp 98.4°F | Wt 167.0 lb

## 2014-10-02 DIAGNOSIS — I10 Essential (primary) hypertension: Secondary | ICD-10-CM | POA: Diagnosis not present

## 2014-10-02 LAB — CBC WITH DIFFERENTIAL/PLATELET
BASOS ABS: 0.1 10*3/uL (ref 0.0–0.1)
Basophils Relative: 0.7 % (ref 0.0–3.0)
EOS ABS: 0.6 10*3/uL (ref 0.0–0.7)
Eosinophils Relative: 7.5 % — ABNORMAL HIGH (ref 0.0–5.0)
HCT: 39.2 % (ref 39.0–52.0)
Hemoglobin: 12.9 g/dL — ABNORMAL LOW (ref 13.0–17.0)
LYMPHS PCT: 28.5 % (ref 12.0–46.0)
Lymphs Abs: 2.3 10*3/uL (ref 0.7–4.0)
MCHC: 33 g/dL (ref 30.0–36.0)
MCV: 91.4 fl (ref 78.0–100.0)
MONOS PCT: 7.9 % (ref 3.0–12.0)
Monocytes Absolute: 0.6 10*3/uL (ref 0.1–1.0)
NEUTROS PCT: 55.4 % (ref 43.0–77.0)
Neutro Abs: 4.5 10*3/uL (ref 1.4–7.7)
PLATELETS: 192 10*3/uL (ref 150.0–400.0)
RBC: 4.29 Mil/uL (ref 4.22–5.81)
RDW: 14.1 % (ref 11.5–15.5)
WBC: 8.2 10*3/uL (ref 4.0–10.5)

## 2014-10-02 LAB — COMPREHENSIVE METABOLIC PANEL
ALBUMIN: 3.4 g/dL — AB (ref 3.5–5.2)
ALT: 13 U/L (ref 0–53)
AST: 16 U/L (ref 0–37)
Alkaline Phosphatase: 112 U/L (ref 39–117)
BUN: 12 mg/dL (ref 6–23)
CALCIUM: 9.3 mg/dL (ref 8.4–10.5)
CO2: 28 mEq/L (ref 19–32)
Chloride: 103 mEq/L (ref 96–112)
Creatinine, Ser: 0.9 mg/dL (ref 0.4–1.5)
GFR: 89.69 mL/min (ref >60.00)
Glucose, Bld: 106 mg/dL — ABNORMAL HIGH (ref 70–99)
POTASSIUM: 4.6 meq/L (ref 3.5–5.1)
Sodium: 141 mEq/L (ref 135–145)
TOTAL PROTEIN: 7.3 g/dL (ref 6.0–8.3)
Total Bilirubin: 0.8 mg/dL (ref 0.2–1.2)

## 2014-10-02 MED ORDER — NIFEDIPINE ER OSMOTIC RELEASE 30 MG PO TB24: 30.0000 mg | ORAL_TABLET | Freq: Every day | ORAL | Status: DC

## 2014-10-02 NOTE — Patient Instructions (Signed)
Get your blood work before you leave    Add Procardia XL 30 mg one tablet daily Call if you have any reaction to the new medication   Check the  blood pressure daily  Be sure your blood pressure is between  145/85  and 110/65.  if it is consistently higher or lower, let me know     Please come back to the office in 3-4 weeks for a routine check up , no  fasting

## 2014-10-02 NOTE — Assessment & Plan Note (Addendum)
BP needs better control, continue with losartan, Lasix 60 mg, carvedilol 25 twice a day. Add Procardia -low dose- noting that he developed some swelling with amlodipine. 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

## 2014-10-02 NOTE — Progress Notes (Signed)
Pre visit review using our clinic review tool, if applicable. No additional management support is needed unless otherwise documented below in the visit note. 

## 2014-10-02 NOTE — Progress Notes (Signed)
Subjective:    Patient ID: Jim [name], male    DOB: 08/12/1932, 80 y.o.   MRN: [id number]  DOS:  10/02/2014 Type of visit - description : f/u Interval history: Hypertension, good medication compliance, ambulatory BPs range from 1330 to 170, usually in the 160s. Diabetes, good medication compliance, not ambulatory CBGs Renal mass, saw urology, recommended observation   ROS Denies lower extremity edema No nausea, vomiting, diarrhea  Past Medical History  Diagnosis Date  . Type 2 diabetes mellitus 10/2009  . COPD (chronic obstructive pulmonary disease)   . Syncope 2010    Evaluated by Dr. Caryl Comes - normal LVEF, possibly neurally mediated  . Hyperlipidemia   . Essential hypertension, benign   . Prostate cancer 2008    s/p XRT  . ED (erectile dysfunction)   . Cholestatic hepatitis 2014  . Personal history of colonic polyps     Past Surgical History  Procedure Laterality Date  . Cataract extraction Bilateral   . Eye surgery Left 07/17/2014  . Colonoscopy    . Ercp N/A 08/16/2014    Procedure: ENDOSCOPIC RETROGRADE CHOLANGIOPANCREATOGRAPHY (ERCP);  Surgeon: Milus Banister, MD;  Location: [facility];  Service: Endoscopy;  Laterality: N/A;  . Cholecystectomy N/A 08/22/2014    Procedure: LAPAROSCOPIC CHOLECYSTECTOMY WITH INTRAOPERATIVE CHOLANGIOGRAM;  Surgeon: Gayland Curry, MD;  Location: [facility];  Service: General;  Laterality: N/A;    History   Social History  . Marital Status: Divorced    Spouse Name: N/A    Number of Children: 2  . Years of Education: N/A   Occupational History  . Retired     Social History Main Topics  . Smoking status: Former Smoker -- 2.00 packs/day for 40 years    Types: Cigarettes    Quit date: 11/24/2005  . Smokeless tobacco: Never Used  . Alcohol Use: 0.0 oz/week     Comment: Rarely  . Drug Use: No  . Sexual Activity: Not on file   Other Topics Concern  . Not on file   Social History Narrative   Independent on ADL    Lives with  son and daughter in law   Has Gk and GGk        Medication List       This list is accurate as of: 10/02/14 11:35 AM.  Always use your most recent med list.               albuterol (2.5 MG/3ML) 0.083% nebulizer solution  Commonly known as:  PROVENTIL  Take 3 mLs (2.5 mg total) by nebulization 4 (four) times daily as needed.     aspirin 81 MG tablet  Take 81 mg by mouth daily.     atorvastatin 20 MG tablet  Commonly known as:  LIPITOR  take 1 tablet by mouth once daily     carvedilol 25 MG tablet  Commonly known as:  COREG  take 1 tablet by mouth twice a day with meals     COMBIVENT RESPIMAT 20-100 MCG/ACT Aers respimat  Generic drug:  Ipratropium-Albuterol  Inhale 1 puff into the lungs every 6 (six) hours as needed for wheezing.     COMBIVENT RESPIMAT 20-100 MCG/ACT Aers respimat  Generic drug:  Ipratropium-Albuterol  inhale 1 puff by mouth every 6 hours if needed for wheezing     furosemide 20 MG tablet  Commonly known as:  LASIX  Take 20 mg by mouth 2 (two) times daily.     losartan 100 MG tablet  Commonly known as:  COZAAR  take 1 tablet by mouth once daily     metFORMIN 500 MG tablet  Commonly known as:  GLUCOPHAGE  take 1 tablet by mouth twice a day with meals     PRESERVISION AREDS PO  Take 2 tablets by mouth daily.     SYMBICORT 160-4.5 MCG/ACT inhaler  Generic drug:  budesonide-formoterol  inhale 2 puffs by mouth twice a day           Objective:   Physical Exam BP 199/93 mmHg  Pulse 75  Temp(Src) 98.4 F (36.9 C) (Oral)  Wt 167 lb (75.751 kg)  SpO2 93%  General -- alert, well-developed, NAD.   HEENT-- Not pale. Or jaundice Lungs -- normal respiratory effort, no intercostal retractions, no accessory muscle use, and normal breath sounds.  Heart-- normal rate, regular rhythm, no murmur.   Extremities-- no pretibial edema bilaterally  Neurologic--  alert & oriented X3. Speech normal, gait appropriate for age, strength symmetric and  appropriate for age.    Psych-- Cognition and judgment appear intact. Cooperative with normal attention span and concentration. No anxious or depressed appearing.        Assessment & Plan:

## 2014-10-11 ENCOUNTER — Telehealth: Payer: Self-pay | Admitting: Internal Medicine

## 2014-10-11 NOTE — Telephone Encounter (Signed)
Caller name: Maven, Varelas Relation to pt: self  Call back number: [phone] Pharmacy: [facility] [phone]  Reason for call:   Pt states NIFEdipine (PROCARDIA-XL/ADALAT-CC/NIFEDICAL-XL) 30 MG 24 hr tablet  Is causing swollen ankle and rash. Please advise

## 2014-10-11 NOTE — Telephone Encounter (Signed)
Spoke with the patient, having swelling, rash and itching around the ankles since he started nifedipine. No difficulty breathing BP has been better. Plan: Discontinue nifedipine, increase Lasix from 2 tablets to 4 tablets daily Continue monitoring BPs, we'll see how he is doing when he comes back in few days, he already has an appointment

## 2014-10-11 NOTE — Telephone Encounter (Signed)
Please advise 

## 2014-10-13 ENCOUNTER — Encounter (INDEPENDENT_AMBULATORY_CARE_PROVIDER_SITE_OTHER): Payer: Medicare Other | Admitting: Ophthalmology

## 2014-10-13 DIAGNOSIS — I10 Essential (primary) hypertension: Secondary | ICD-10-CM | POA: Diagnosis not present

## 2014-10-13 DIAGNOSIS — H35033 Hypertensive retinopathy, bilateral: Secondary | ICD-10-CM

## 2014-10-13 DIAGNOSIS — H43813 Vitreous degeneration, bilateral: Secondary | ICD-10-CM

## 2014-10-13 DIAGNOSIS — E11329 Type 2 diabetes mellitus with mild nonproliferative diabetic retinopathy without macular edema: Secondary | ICD-10-CM

## 2014-10-13 DIAGNOSIS — E11319 Type 2 diabetes mellitus with unspecified diabetic retinopathy without macular edema: Secondary | ICD-10-CM | POA: Diagnosis not present

## 2014-10-13 DIAGNOSIS — H3532 Exudative age-related macular degeneration: Secondary | ICD-10-CM | POA: Diagnosis not present

## 2014-10-23 ENCOUNTER — Other Ambulatory Visit: Payer: Self-pay

## 2014-10-23 ENCOUNTER — Ambulatory Visit: Payer: Medicare Other | Admitting: Internal Medicine

## 2014-10-24 ENCOUNTER — Other Ambulatory Visit: Payer: Self-pay | Admitting: Internal Medicine

## 2014-10-24 ENCOUNTER — Other Ambulatory Visit (INDEPENDENT_AMBULATORY_CARE_PROVIDER_SITE_OTHER): Payer: Medicare Other

## 2014-10-24 DIAGNOSIS — R799 Abnormal finding of blood chemistry, unspecified: Secondary | ICD-10-CM | POA: Diagnosis not present

## 2014-10-24 LAB — HEPATIC FUNCTION PANEL
ALK PHOS: 134 U/L — AB (ref 39–117)
ALT: 18 U/L (ref 0–53)
AST: 20 U/L (ref 0–37)
Albumin: 4 g/dL (ref 3.5–5.2)
BILIRUBIN DIRECT: 0.1 mg/dL (ref 0.0–0.3)
BILIRUBIN TOTAL: 0.8 mg/dL (ref 0.2–1.2)
Total Protein: 7 g/dL (ref 6.0–8.3)

## 2014-10-27 ENCOUNTER — Ambulatory Visit (INDEPENDENT_AMBULATORY_CARE_PROVIDER_SITE_OTHER): Payer: Self-pay | Admitting: Gastroenterology

## 2014-10-27 ENCOUNTER — Encounter: Payer: Self-pay | Admitting: Gastroenterology

## 2014-10-27 VITALS — BP 152/74 | HR 60 | Ht 72.0 in | Wt 168.2 lb

## 2014-10-27 DIAGNOSIS — K8050 Calculus of bile duct without cholangitis or cholecystitis without obstruction: Secondary | ICD-10-CM

## 2014-10-27 NOTE — Progress Notes (Signed)
Review of pertinent gastrointestinal problems: 1. Gallstone disease; ERCP Dr. Ardis Hughs 07/2014 CBD stone removed after biliary sphincterotomy; LAb chole 07/2014 Dr. [name], read IOC as normal, however radiologist felt ? Filling defect.  LFTs normal.   HPI: This is a very pleasant 80 yo man whom I last saw 2 months ago at time of ERCP  LFTs earlier this week were essentially normal excpet alk phos slightly elevated.  Feels fine, no abd pains, no fevers, no chills, no nausea    Past Medical History  Diagnosis Date  . Type 2 diabetes mellitus 10/2009  . COPD (chronic obstructive pulmonary disease)   . Syncope 2010    Evaluated by Dr. Caryl Comes - normal LVEF, possibly neurally mediated  . Hyperlipidemia   . Essential hypertension, benign   . Prostate cancer 2008    s/p XRT  . ED (erectile dysfunction)   . Cholestatic hepatitis 2014  . Personal history of colonic polyps     Past Surgical History  Procedure Laterality Date  . Cataract extraction Bilateral   . Eye surgery Left 07/17/2014  . Colonoscopy    . Ercp N/A 08/16/2014    Procedure: ENDOSCOPIC RETROGRADE CHOLANGIOPANCREATOGRAPHY (ERCP);  Surgeon: Milus Banister, MD;  Location: [facility];  Service: Endoscopy;  Laterality: N/A;  . Cholecystectomy N/A 08/22/2014    Procedure: LAPAROSCOPIC CHOLECYSTECTOMY WITH INTRAOPERATIVE CHOLANGIOGRAM;  Surgeon: Gayland Curry, MD;  Location: [facility];  Service: General;  Laterality: N/A;    Current Outpatient Prescriptions  Medication Sig Dispense Refill  . albuterol (PROVENTIL) (2.5 MG/3ML) 0.083% nebulizer solution Take 3 mLs (2.5 mg total) by nebulization 4 (four) times daily as needed. 75 mL 0  . aspirin 81 MG tablet Take 81 mg by mouth daily.      Marland Kitchen atorvastatin (LIPITOR) 20 MG tablet take 1 tablet by mouth once daily 30 tablet 6  . carvedilol (COREG) 25 MG tablet take 1 tablet by mouth twice a day with meals 60 tablet 6  . furosemide (LASIX) 20 MG tablet Take 60 mg by mouth daily.     .  Ipratropium-Albuterol (COMBIVENT RESPIMAT) 20-100 MCG/ACT AERS respimat Inhale 1 puff into the lungs every 6 (six) hours as needed for wheezing.    Marland Kitchen losartan (COZAAR) 100 MG tablet take 1 tablet by mouth once daily 30 tablet 6  . metFORMIN (GLUCOPHAGE) 500 MG tablet take 1 tablet by mouth twice a day with food 60 tablet 3  . Multiple Vitamins-Minerals (PRESERVISION AREDS PO) Take 2 tablets by mouth daily.      . SYMBICORT 160-4.5 MCG/ACT inhaler inhale 2 puffs by mouth twice a day 10.2 g 5   No current facility-administered medications for this visit.    Allergies as of 10/27/2014 - Review Complete 10/27/2014  Allergen Reaction Noted  . Amlodipine besylate  10/12/2007  . Cardura [doxazosin mesylate] Itching 11/02/2013  . Clonidine derivatives Itching 11/02/2013  . Fluticasone-salmeterol Hives and Itching     Family History  Problem Relation Age of Onset  . Cirrhosis Brother   . Dementia Mother   . Colon cancer Neg Hx   . Prostate cancer Neg Hx   . CAD Neg Hx     History   Social History  . Marital Status: Divorced    Spouse Name: N/A    Number of Children: 2  . Years of Education: N/A   Occupational History  . Retired     Social History Main Topics  . Smoking status: Former Smoker -- 2.00 packs/day for  40 years    Types: Cigarettes    Quit date: 11/24/2005  . Smokeless tobacco: Never Used  . Alcohol Use: 0.0 oz/week     Comment: Rarely  . Drug Use: No  . Sexual Activity: Not on file   Other Topics Concern  . Not on file   Social History Narrative   Independent on ADL    Lives with son and daughter in law   Has Gk and GGk      Physical Exam: BP 152/74 mmHg  Pulse 60  Ht 6' (1.829 m)  Wt 168 lb 3.2 oz (76.295 kg)  BMI 22.81 kg/m2 Constitutional: generally well-appearing Psychiatric: alert and oriented x3 Abdomen: soft, nontender, nondistended, no obvious ascites, no peritoneal signs, normal bowel sounds     Assessment and plan: 80 y.o. male with  resolved GS disease s/p ERCP, lap chole 2 months ago  LFTs normal, I suspect the IOC abnormality was an air bubble.  Follow up as needed.

## 2014-10-27 NOTE — Patient Instructions (Signed)
Follow up as needed

## 2014-11-08 ENCOUNTER — Encounter: Payer: Self-pay | Admitting: Internal Medicine

## 2014-11-08 ENCOUNTER — Ambulatory Visit (INDEPENDENT_AMBULATORY_CARE_PROVIDER_SITE_OTHER): Payer: Medicare Other | Admitting: Internal Medicine

## 2014-11-08 VITALS — BP 198/88 | HR 77 | Temp 97.9°F | Wt 168.0 lb

## 2014-11-08 DIAGNOSIS — I10 Essential (primary) hypertension: Secondary | ICD-10-CM

## 2014-11-08 DIAGNOSIS — N2889 Other specified disorders of kidney and ureter: Secondary | ICD-10-CM

## 2014-11-08 LAB — BASIC METABOLIC PANEL
BUN: 13 mg/dL (ref 6–23)
CALCIUM: 9.1 mg/dL (ref 8.4–10.5)
CO2: 24 meq/L (ref 19–32)
CREATININE: 1 mg/dL (ref 0.4–1.5)
Chloride: 105 mEq/L (ref 96–112)
GFR: 80.04 mL/min (ref >60.00)
GLUCOSE: 100 mg/dL — AB (ref 70–99)
Potassium: 3.7 mEq/L (ref 3.5–5.1)
Sodium: 140 mEq/L (ref 135–145)

## 2014-11-08 NOTE — Progress Notes (Signed)
Pre visit review using our clinic review tool, if applicable. No additional management support is needed unless otherwise documented below in the visit note. 

## 2014-11-08 NOTE — Progress Notes (Signed)
Subjective:    Patient ID: Jim [name], male    DOB: November 25, 1931, 80 y.o.   MRN: [id number]  DOS:  11/08/2014 Type of visit - description : rov Interval history: Hypertension, since the last office visit, medication was adjusted, see assessment and plan. Ambulatory BPs consistently 120- 150. Diastolic 15-40. Last week he had to push the brakes really hard while driving, complaining of back pain on-off since , mostly when he stands up. No radiation.  ROS Denies chest pain difficulty breathing or lower extremity edema No nausea, vomiting, diarrhea  Past Medical History  Diagnosis Date  . Type 2 diabetes mellitus 10/2009  . COPD (chronic obstructive pulmonary disease)   . Syncope 2010    Evaluated by Dr. Caryl Comes - normal LVEF, possibly neurally mediated  . Hyperlipidemia   . Essential hypertension, benign   . Prostate cancer 2008    s/p XRT  . ED (erectile dysfunction)   . Cholestatic hepatitis 2014  . Personal history of colonic polyps   . Renal mass 08/12/2014    Past Surgical History  Procedure Laterality Date  . Cataract extraction Bilateral   . Eye surgery Left 07/17/2014  . Colonoscopy    . Ercp N/A 08/16/2014    Procedure: ENDOSCOPIC RETROGRADE CHOLANGIOPANCREATOGRAPHY (ERCP);  Surgeon: Milus Banister, MD;  Location: [facility];  Service: Endoscopy;  Laterality: N/A;  . Cholecystectomy N/A 08/22/2014    Procedure: LAPAROSCOPIC CHOLECYSTECTOMY WITH INTRAOPERATIVE CHOLANGIOGRAM;  Surgeon: Gayland Curry, MD;  Location: [facility];  Service: General;  Laterality: N/A;    History   Social History  . Marital Status: Divorced    Spouse Name: N/A    Number of Children: 2  . Years of Education: N/A   Occupational History  . Retired     Social History Main Topics  . Smoking status: Former Smoker -- 2.00 packs/day for 40 years    Types: Cigarettes    Quit date: 11/24/2005  . Smokeless tobacco: Never Used  . Alcohol Use: 0.0 oz/week     Comment: Rarely  . Drug Use: No    . Sexual Activity: Not on file   Other Topics Concern  . Not on file   Social History Narrative   Independent on ADL    Lives with son and daughter in law   Has Gk and GGk        Medication List       This list is accurate as of: 11/08/14 11:59 PM.  Always use your most recent med list.               albuterol (2.5 MG/3ML) 0.083% nebulizer solution  Commonly known as:  PROVENTIL  Take 3 mLs (2.5 mg total) by nebulization 4 (four) times daily as needed.     aspirin 81 MG tablet  Take 81 mg by mouth daily.     atorvastatin 20 MG tablet  Commonly known as:  LIPITOR  take 1 tablet by mouth once daily     carvedilol 25 MG tablet  Commonly known as:  COREG  take 1 tablet by mouth twice a day with meals     COMBIVENT RESPIMAT 20-100 MCG/ACT Aers respimat  Generic drug:  Ipratropium-Albuterol  Inhale 1 puff into the lungs every 6 (six) hours as needed for wheezing.     furosemide 20 MG tablet  Commonly known as:  LASIX  Take 80 mg by mouth daily.     losartan 100 MG tablet  Commonly known  as:  COZAAR  take 1 tablet by mouth once daily     metFORMIN 500 MG tablet  Commonly known as:  GLUCOPHAGE  take 1 tablet by mouth twice a day with food     PRESERVISION AREDS PO  Take 2 tablets by mouth daily.     SYMBICORT 160-4.5 MCG/ACT inhaler  Generic drug:  budesonide-formoterol  inhale 2 puffs by mouth twice a day           Objective:   Physical Exam BP 198/88 mmHg  Pulse 77  Temp(Src) 97.9 F (36.6 C) (Oral)  Wt 168 lb (76.204 kg)  SpO2 91% General -- alert, well-developed, NAD.   Lungs -- normal respiratory effort, no intercostal retractions, no accessory muscle use, and normal decreased breath sounds.  Heart-- normal rate, regular rhythm, no murmur.  Extremities-- no pretibial edema bilaterally  Neurologic--  alert & oriented X3. Speech normal, gait appropriate for age, strength symmetric and appropriate for age.  Psych-- Cognition and judgment appear  intact. Cooperative with normal attention span and concentration. No anxious or depressed appearing.        Assessment & Plan:   Back pain, Recommend Tylenol as needed, call or come back if no better

## 2014-11-08 NOTE — Patient Instructions (Signed)
Get your blood work before you leave   Continue checking your blood pressures every other day, call if the readings are consistently more than 145/95  Please come back to the office in 3 months  for a physical exam. Come back fasting

## 2014-11-08 NOTE — Assessment & Plan Note (Signed)
Saw urology a few months ago, they recommended observation for now, and they plan to follow-up with him soon

## 2014-11-08 NOTE — Assessment & Plan Note (Signed)
Was recommended nifedipine, developed swelling rash and itching. Was recommended to d/c it and increase Lasix to 4 tablets daily. Ambulatory BPs are very good, BP today is elevated. Plan: BMP, continue with carvedilol 25 mg twice a day, losartan 100 mg, Lasix 80 mg daily. Continue monitoring BPs

## 2014-11-09 ENCOUNTER — Encounter (INDEPENDENT_AMBULATORY_CARE_PROVIDER_SITE_OTHER): Payer: Medicare Other | Admitting: Ophthalmology

## 2014-11-09 DIAGNOSIS — H43813 Vitreous degeneration, bilateral: Secondary | ICD-10-CM | POA: Diagnosis not present

## 2014-11-09 DIAGNOSIS — E11319 Type 2 diabetes mellitus with unspecified diabetic retinopathy without macular edema: Secondary | ICD-10-CM

## 2014-11-09 DIAGNOSIS — I10 Essential (primary) hypertension: Secondary | ICD-10-CM | POA: Diagnosis not present

## 2014-11-09 DIAGNOSIS — H3532 Exudative age-related macular degeneration: Secondary | ICD-10-CM | POA: Diagnosis not present

## 2014-11-09 DIAGNOSIS — E11329 Type 2 diabetes mellitus with mild nonproliferative diabetic retinopathy without macular edema: Secondary | ICD-10-CM

## 2014-11-09 DIAGNOSIS — H35033 Hypertensive retinopathy, bilateral: Secondary | ICD-10-CM | POA: Diagnosis not present

## 2014-11-10 ENCOUNTER — Other Ambulatory Visit: Payer: Self-pay | Admitting: Pulmonary Disease

## 2014-11-10 ENCOUNTER — Other Ambulatory Visit: Payer: Self-pay | Admitting: Internal Medicine

## 2014-11-27 ENCOUNTER — Telehealth: Payer: Self-pay | Admitting: Pulmonary Disease

## 2014-11-27 NOTE — Telephone Encounter (Signed)
Called spoke with pt. He is going to call his insurance to see what alternative is and call us back. Will sign off for now

## 2014-12-06 ENCOUNTER — Telehealth: Payer: Self-pay

## 2014-12-06 ENCOUNTER — Other Ambulatory Visit: Payer: Self-pay

## 2014-12-06 MED ORDER — FUROSEMIDE 40 MG PO TABS: 40.0000 mg | ORAL_TABLET | Freq: Two times a day (BID) | ORAL | Status: DC

## 2014-12-06 NOTE — Telephone Encounter (Signed)
Pt came into office today, per Dr. Larose Kells increase Lasix to 40 mg 2 tablets daily (80 mg total), sent to pharmacy.

## 2014-12-07 ENCOUNTER — Encounter (INDEPENDENT_AMBULATORY_CARE_PROVIDER_SITE_OTHER): Payer: Medicare Other | Admitting: Ophthalmology

## 2014-12-07 DIAGNOSIS — E11329 Type 2 diabetes mellitus with mild nonproliferative diabetic retinopathy without macular edema: Secondary | ICD-10-CM | POA: Diagnosis not present

## 2014-12-07 DIAGNOSIS — H43813 Vitreous degeneration, bilateral: Secondary | ICD-10-CM

## 2014-12-07 DIAGNOSIS — I10 Essential (primary) hypertension: Secondary | ICD-10-CM

## 2014-12-07 DIAGNOSIS — H3532 Exudative age-related macular degeneration: Secondary | ICD-10-CM

## 2014-12-07 DIAGNOSIS — H35033 Hypertensive retinopathy, bilateral: Secondary | ICD-10-CM | POA: Diagnosis not present

## 2014-12-07 DIAGNOSIS — E11319 Type 2 diabetes mellitus with unspecified diabetic retinopathy without macular edema: Secondary | ICD-10-CM | POA: Diagnosis not present

## 2014-12-07 DIAGNOSIS — H33302 Unspecified retinal break, left eye: Secondary | ICD-10-CM | POA: Diagnosis not present

## 2014-12-12 ENCOUNTER — Other Ambulatory Visit (INDEPENDENT_AMBULATORY_CARE_PROVIDER_SITE_OTHER): Payer: Medicare Other

## 2014-12-12 ENCOUNTER — Telehealth: Payer: Self-pay

## 2014-12-12 DIAGNOSIS — R799 Abnormal finding of blood chemistry, unspecified: Secondary | ICD-10-CM | POA: Diagnosis not present

## 2014-12-12 DIAGNOSIS — N289 Disorder of kidney and ureter, unspecified: Secondary | ICD-10-CM | POA: Diagnosis not present

## 2014-12-12 DIAGNOSIS — Z8546 Personal history of malignant neoplasm of prostate: Secondary | ICD-10-CM | POA: Diagnosis not present

## 2014-12-12 DIAGNOSIS — D495 Neoplasm of unspecified behavior of other genitourinary organs: Secondary | ICD-10-CM | POA: Diagnosis not present

## 2014-12-12 LAB — BASIC METABOLIC PANEL
BUN: 13 mg/dL (ref 4–21)
Creatinine: 0.9 mg/dL (ref <=1.3)

## 2014-12-12 LAB — HEPATIC FUNCTION PANEL
ALBUMIN: 4 g/dL (ref 3.5–5.2)
ALT: 13 U/L (ref 0–53)
AST: 15 U/L (ref 0–37)
Alkaline Phosphatase: 127 U/L — ABNORMAL HIGH (ref 39–117)
BILIRUBIN TOTAL: 0.8 mg/dL (ref 0.2–1.2)
Bilirubin, Direct: 0.2 mg/dL (ref 0.0–0.3)
TOTAL PROTEIN: 7.3 g/dL (ref 6.0–8.3)

## 2014-12-12 NOTE — Telephone Encounter (Signed)
Pt has been notified and reminded to have labs he will be in this week

## 2014-12-12 NOTE — Telephone Encounter (Signed)
-----   Message from Barron Alvine, [place] sent at 10/31/2014  1:53 PM EST ----- Pt to get labs in 6 weeks

## 2014-12-19 DIAGNOSIS — Z8546 Personal history of malignant neoplasm of prostate: Secondary | ICD-10-CM | POA: Diagnosis not present

## 2014-12-19 DIAGNOSIS — D495 Neoplasm of unspecified behavior of other genitourinary organs: Secondary | ICD-10-CM | POA: Diagnosis not present

## 2015-01-04 ENCOUNTER — Encounter (INDEPENDENT_AMBULATORY_CARE_PROVIDER_SITE_OTHER): Payer: Medicare Other | Admitting: Ophthalmology

## 2015-01-04 DIAGNOSIS — H3532 Exudative age-related macular degeneration: Secondary | ICD-10-CM | POA: Diagnosis not present

## 2015-01-04 DIAGNOSIS — E11329 Type 2 diabetes mellitus with mild nonproliferative diabetic retinopathy without macular edema: Secondary | ICD-10-CM | POA: Diagnosis not present

## 2015-01-04 DIAGNOSIS — I10 Essential (primary) hypertension: Secondary | ICD-10-CM | POA: Diagnosis not present

## 2015-01-04 DIAGNOSIS — H43813 Vitreous degeneration, bilateral: Secondary | ICD-10-CM

## 2015-01-04 DIAGNOSIS — E11319 Type 2 diabetes mellitus with unspecified diabetic retinopathy without macular edema: Secondary | ICD-10-CM

## 2015-01-04 DIAGNOSIS — H35033 Hypertensive retinopathy, bilateral: Secondary | ICD-10-CM

## 2015-01-04 DIAGNOSIS — H33302 Unspecified retinal break, left eye: Secondary | ICD-10-CM

## 2015-01-04 LAB — HM DIABETES EYE EXAM

## 2015-01-17 ENCOUNTER — Other Ambulatory Visit: Payer: Self-pay | Admitting: Internal Medicine

## 2015-01-17 ENCOUNTER — Other Ambulatory Visit: Payer: Self-pay

## 2015-01-31 ENCOUNTER — Ambulatory Visit (INDEPENDENT_AMBULATORY_CARE_PROVIDER_SITE_OTHER): Payer: Medicare Other | Admitting: Pulmonary Disease

## 2015-01-31 ENCOUNTER — Encounter: Payer: Self-pay | Admitting: Pulmonary Disease

## 2015-01-31 ENCOUNTER — Telehealth: Payer: Self-pay | Admitting: Pulmonary Disease

## 2015-01-31 VITALS — BP 120/56 | HR 78 | Temp 96.8°F | Ht 69.0 in | Wt 170.0 lb

## 2015-01-31 DIAGNOSIS — J438 Other emphysema: Secondary | ICD-10-CM | POA: Diagnosis not present

## 2015-01-31 NOTE — Assessment & Plan Note (Signed)
The patient continues to do fairly well from a COPD standpoint on his current regimen. He has not had an acute exacerbation since the last visit, and rarely requires his rescue inhaler. I would like to continue him on an LABA/ICS, and have given the patient alternatives to his Symbicort to see if they are covered by his insurance. He is to let us know, and we can send in the appropriate prescription. I have also asked him to stay as active as possible, and to keep his conditioning up.

## 2015-01-31 NOTE — Telephone Encounter (Signed)
Spoke with pt, states he called his insurance company, and they will in fact pay for his symbicort.  Pt does not need refills at this time.  Nothing further needed.  Forwarding to [facility] only as a fyi.

## 2015-01-31 NOTE — Patient Instructions (Signed)
Will need to find out what alternatives your insurance covers in the place of symbicort:  Look at dulera 100, breo 100, advair 250/50.  Let us know which one is covered, and we can send in prescription.  Stay as active as possible. followup with me again in 33mos.

## 2015-01-31 NOTE — Progress Notes (Signed)
   Subjective:    Patient ID: Jim [name], male    DOB: March 07, 1932, 81 y.o.   MRN: [id number]  HPI The patient comes in today for follow-up of his known COPD. He has maintained on Symbicort, and has done very well since the last visit. He feels his breathing is at baseline, and has not had a significant pulmonary infection. He tells me that his insurance is no longer going to cover Symbicort, and we will need to come up with an alternative.   Review of Systems  Constitutional: Negative.  Negative for diaphoresis and activity change.  HENT: Positive for congestion. Negative for dental problem, ear discharge, facial swelling, mouth sores, postnasal drip, rhinorrhea, sneezing, sore throat, trouble swallowing and voice change.   Eyes: Negative for photophobia, discharge and itching.  Respiratory: Positive for shortness of breath. Negative for apnea, cough, chest tightness and wheezing.   Cardiovascular: Negative for chest pain, palpitations and leg swelling.  Gastrointestinal: Negative for nausea, abdominal pain and diarrhea.  Endocrine: Negative.   Musculoskeletal: Negative for back pain, arthralgias, gait problem and neck pain.  Allergic/Immunologic: Negative.   Neurological: Negative for tremors, seizures, syncope, facial asymmetry, weakness, light-headedness and headaches.  Hematological: Negative.   Psychiatric/Behavioral: Negative.  Negative for hallucinations, confusion, self-injury and agitation.       Objective:   Physical Exam Well-developed male in no acute distress Nose without purulence or discharge noted Neck without lymphadenopathy or thyromegaly Chest with mild decrease in breath sounds, no active wheezing Cardiac exam with regular rate and rhythm Lower extremities without edema, no cyanosis Alert and oriented, moves all 4 extremities.       Assessment & Plan:

## 2015-02-03 ENCOUNTER — Other Ambulatory Visit: Payer: Self-pay | Admitting: Internal Medicine

## 2015-02-08 ENCOUNTER — Encounter (INDEPENDENT_AMBULATORY_CARE_PROVIDER_SITE_OTHER): Payer: Medicare Other | Admitting: Ophthalmology

## 2015-02-08 DIAGNOSIS — H43813 Vitreous degeneration, bilateral: Secondary | ICD-10-CM

## 2015-02-08 DIAGNOSIS — E11329 Type 2 diabetes mellitus with mild nonproliferative diabetic retinopathy without macular edema: Secondary | ICD-10-CM | POA: Diagnosis not present

## 2015-02-08 DIAGNOSIS — H35033 Hypertensive retinopathy, bilateral: Secondary | ICD-10-CM

## 2015-02-08 DIAGNOSIS — H3532 Exudative age-related macular degeneration: Secondary | ICD-10-CM | POA: Diagnosis not present

## 2015-02-08 DIAGNOSIS — E11319 Type 2 diabetes mellitus with unspecified diabetic retinopathy without macular edema: Secondary | ICD-10-CM

## 2015-02-08 DIAGNOSIS — H3531 Nonexudative age-related macular degeneration: Secondary | ICD-10-CM | POA: Diagnosis not present

## 2015-02-08 DIAGNOSIS — I10 Essential (primary) hypertension: Secondary | ICD-10-CM | POA: Diagnosis not present

## 2015-02-13 ENCOUNTER — Telehealth: Payer: Self-pay

## 2015-02-13 NOTE — Telephone Encounter (Signed)
See speciality notes 

## 2015-02-15 ENCOUNTER — Encounter: Payer: Self-pay | Admitting: Internal Medicine

## 2015-02-15 ENCOUNTER — Ambulatory Visit (INDEPENDENT_AMBULATORY_CARE_PROVIDER_SITE_OTHER): Payer: Medicare Other | Admitting: Internal Medicine

## 2015-02-15 VITALS — BP 138/80 | HR 67 | Temp 97.6°F | Ht 68.5 in | Wt 168.4 lb

## 2015-02-15 DIAGNOSIS — I10 Essential (primary) hypertension: Secondary | ICD-10-CM

## 2015-02-15 DIAGNOSIS — J438 Other emphysema: Secondary | ICD-10-CM

## 2015-02-15 DIAGNOSIS — K8050 Calculus of bile duct without cholangitis or cholecystitis without obstruction: Secondary | ICD-10-CM

## 2015-02-15 DIAGNOSIS — E119 Type 2 diabetes mellitus without complications: Secondary | ICD-10-CM | POA: Diagnosis not present

## 2015-02-15 DIAGNOSIS — C61 Malignant neoplasm of prostate: Secondary | ICD-10-CM

## 2015-02-15 DIAGNOSIS — Z0000 Encounter for general adult medical examination without abnormal findings: Secondary | ICD-10-CM

## 2015-02-15 DIAGNOSIS — N2889 Other specified disorders of kidney and ureter: Secondary | ICD-10-CM | POA: Diagnosis not present

## 2015-02-15 LAB — CBC WITH DIFFERENTIAL/PLATELET
Basophils Absolute: 0 10*3/uL (ref 0.0–0.1)
Basophils Relative: 0.6 % (ref 0.0–3.0)
EOS ABS: 0.9 10*3/uL — AB (ref 0.0–0.7)
Eosinophils Relative: 11.9 % — ABNORMAL HIGH (ref 0.0–5.0)
HEMATOCRIT: 45.4 % (ref 39.0–52.0)
Hemoglobin: 15.7 g/dL (ref 13.0–17.0)
Lymphocytes Relative: 22.3 % (ref 12.0–46.0)
Lymphs Abs: 1.6 10*3/uL (ref 0.7–4.0)
MCHC: 34.6 g/dL (ref 30.0–36.0)
MCV: 91.8 fl (ref 78.0–100.0)
Monocytes Absolute: 0.4 10*3/uL (ref 0.1–1.0)
Monocytes Relative: 5.8 % (ref 3.0–12.0)
NEUTROS ABS: 4.3 10*3/uL (ref 1.4–7.7)
Neutrophils Relative %: 59.4 % (ref 43.0–77.0)
PLATELETS: 162 10*3/uL (ref 150.0–400.0)
RBC: 4.94 Mil/uL (ref 4.22–5.81)
RDW: 14.7 % (ref 11.5–15.5)
WBC: 7.3 10*3/uL (ref 4.0–10.5)

## 2015-02-15 LAB — TSH: TSH: 1.61 u[IU]/mL (ref 0.35–4.50)

## 2015-02-15 LAB — HEMOGLOBIN A1C: HEMOGLOBIN A1C: 7 % — AB (ref 4.6–6.5)

## 2015-02-15 MED ORDER — ALBUTEROL SULFATE (2.5 MG/3ML) 0.083% IN NEBU: 2.5000 mg | INHALATION_SOLUTION | Freq: Four times a day (QID) | RESPIRATORY_TRACT | Status: DC | PRN

## 2015-02-15 MED ORDER — FUROSEMIDE 40 MG PO TABS: 40.0000 mg | ORAL_TABLET | Freq: Two times a day (BID) | ORAL | Status: DC

## 2015-02-15 MED ORDER — CARVEDILOL 25 MG PO TABS: 25.0000 mg | ORAL_TABLET | Freq: Two times a day (BID) | ORAL | Status: DC

## 2015-02-15 MED ORDER — METFORMIN HCL 500 MG PO TABS: 500.0000 mg | ORAL_TABLET | Freq: Two times a day (BID) | ORAL | Status: DC

## 2015-02-15 MED ORDER — MOMETASONE FURO-FORMOTEROL FUM 200-5 MCG/ACT IN AERO: 2.0000 | INHALATION_SPRAY | Freq: Two times a day (BID) | RESPIRATORY_TRACT | Status: DC

## 2015-02-15 MED ORDER — ATORVASTATIN CALCIUM 20 MG PO TABS: 20.0000 mg | ORAL_TABLET | Freq: Every day | ORAL | Status: DC

## 2015-02-15 MED ORDER — LOSARTAN POTASSIUM 100 MG PO TABS: 100.0000 mg | ORAL_TABLET | Freq: Every day | ORAL | Status: DC

## 2015-02-15 MED ORDER — IPRATROPIUM-ALBUTEROL 20-100 MCG/ACT IN AERS: 1.0000 | INHALATION_SPRAY | Freq: Four times a day (QID) | RESPIRATORY_TRACT | Status: DC | PRN

## 2015-02-15 NOTE — Progress Notes (Signed)
Pre visit review using our clinic review tool, if applicable. No additional management support is needed unless otherwise documented below in the visit note. 

## 2015-02-15 NOTE — Assessment & Plan Note (Signed)
Follow-up by urology, last CT 11-2014, stable, was recommended to in June 2016

## 2015-02-15 NOTE — Assessment & Plan Note (Addendum)
Has mild anemia, likely postop, check a CBC

## 2015-02-15 NOTE — Assessment & Plan Note (Addendum)
BP today is very good, at home seems to be slightly higher. At this point we will continue with the same medications and reassess in 4 months

## 2015-02-15 NOTE — Progress Notes (Signed)
Subjective:    Patient ID: Jim [name], male    DOB: 1932-03-20, 81 y.o.   MRN: [id number]  DOS:  02/15/2015 Type of visit - description :    Here for Medicare AWV: 1. Risk factors based on Past M, S, F history: reviewed 2. Physical Activities: active at home, + light  yard work    3. Depression/mood: Neg screening 4. Hearing: No problems noted or reported  5. ADL's: Independent 6. Fall Risk: no recent falls, prevention discussed  7. home Safety: does feel safe at home  8. Height, weight, &visual acuity: see VS, sees the eye doctor , s/p cataracts, great vision 9. Counseling: provided 10. Labs ordered based on risk factors: if needed  11. Referral Coordination: if needed 12. Care Plan, see assessment and plan  13. Cognitive Assessment: Cognition and motor skills seem appropriate for age  29. Care team updated 15. End of life care discussed  In addition, today we discussed the following: Hypertension, good compliance with medications, BP today is great, ambulatory BPs range from 017-510, diastolic BP always in the 70s to 80s. COPD, needs to change Symbicort to another medication due to insurance constraints Diabetes, good compliance with metformin, not ambulatory CBGs Sees urology for several issues, labs and CTs reviewed.   Review of Systems Constitutional: No fever, chills. No unexplained wt changes. No unusual sweats HEENT: No dental problems, ear discharge, facial swelling, voice changes. No eye discharge, redness or intolerance to light Respiratory:  Respiratory symptoms at baseline including mild cough, + DOE. Denies hemoptysis Cardiovascular: No CP, leg swelling or palpitations GI: no nausea, vomiting, diarrhea or abdominal pain.  No blood in the stools. No dysphagia   Endocrine: No polyphagia, polyuria or polydipsia GU: No dysuria, gross hematuria, difficulty urinating. No urinary urgency or frequency. Musculoskeletal: No joint swellings or unusual  aches or pains Skin: No change in the color of the skin, palor or rash Allergic, immunologic: No environmental allergies or food allergies Neurological: No dizziness or syncope. No headaches. No diplopia, slurred speech, motor deficits, facial numbness Hematological: No enlarged lymph nodes, easy bruising or bleeding Psychiatry: No suicidal ideas, hallucinations, behavior problems or confusion. No unusual/severe anxiety or depression.     Past Medical History  Diagnosis Date  . Type 2 diabetes mellitus 10/2009  . COPD (chronic obstructive pulmonary disease)   . Syncope 2010    Evaluated by Dr. Caryl Comes - normal LVEF, possibly neurally mediated  . Hyperlipidemia   . Essential hypertension, benign   . Prostate cancer 2008    s/p XRT  . ED (erectile dysfunction)   . Cholestatic hepatitis 2014  . Personal history of colonic polyps   . Renal mass 08/12/2014    Past Surgical History  Procedure Laterality Date  . Cataract extraction Bilateral   . Eye surgery Left 07/17/2014  . Colonoscopy    . Ercp N/A 08/16/2014    Procedure: ENDOSCOPIC RETROGRADE CHOLANGIOPANCREATOGRAPHY (ERCP);  Surgeon: Milus Banister, MD;  Location: [facility];  Service: Endoscopy;  Laterality: N/A;  . Cholecystectomy N/A 08/22/2014    Procedure: LAPAROSCOPIC CHOLECYSTECTOMY WITH INTRAOPERATIVE CHOLANGIOGRAM;  Surgeon: Gayland Curry, MD;  Location: [facility];  Service: General;  Laterality: N/A;    History   Social History  . Marital Status: Divorced    Spouse Name: N/A  . Number of Children: 2  . Years of Education: N/A   Occupational History  . Retired     Social History Main Topics  . Smoking status:  Former Smoker -- 2.00 packs/day for 40 years    Types: Cigarettes    Quit date: 11/24/2005  . Smokeless tobacco: Never Used  . Alcohol Use: 0.0 oz/week     Comment: Rarely  . Drug Use: No  . Sexual Activity: Not on file   Other Topics Concern  . Not on file   Social History Narrative   Independent on ADL      Lives with son and daughter in law   Has Gk and GGk     Family History  Problem Relation Age of Onset  . Cirrhosis Brother   . Dementia Mother   . Colon cancer Neg Hx   . Prostate cancer Neg Hx   . CAD Neg Hx        Medication List       This list is accurate as of: 02/15/15 11:59 PM.  Always use your most recent med list.               albuterol (2.5 MG/3ML) 0.083% nebulizer solution  Commonly known as:  PROVENTIL  Take 3 mLs (2.5 mg total) by nebulization 4 (four) times daily as needed.     aspirin 81 MG tablet  Take 81 mg by mouth daily.     atorvastatin 20 MG tablet  Commonly known as:  LIPITOR  Take 1 tablet (20 mg total) by mouth daily.     carvedilol 25 MG tablet  Commonly known as:  COREG  Take 1 tablet (25 mg total) by mouth 2 (two) times daily with a meal.     furosemide 40 MG tablet  Commonly known as:  LASIX  Take 1 tablet (40 mg total) by mouth 2 (two) times daily.     Ipratropium-Albuterol 20-100 MCG/ACT Aers respimat  Commonly known as:  COMBIVENT RESPIMAT  Inhale 1 puff into the lungs every 6 (six) hours as needed for wheezing.     losartan 100 MG tablet  Commonly known as:  COZAAR  Take 1 tablet (100 mg total) by mouth daily.     metFORMIN 500 MG tablet  Commonly known as:  GLUCOPHAGE  Take 1 tablet (500 mg total) by mouth 2 (two) times daily with a meal.     mometasone-formoterol 200-5 MCG/ACT Aero  Commonly known as:  DULERA  Inhale 2 puffs into the lungs 2 (two) times daily.     PRESERVISION AREDS PO  Take 2 tablets by mouth daily.           Objective:   Physical Exam BP 138/80 mmHg  Pulse 67  Temp(Src) 97.6 F (36.4 C) (Oral)  Ht 5' 8.5" (1.74 m)  Wt 168 lb 6 oz (76.374 kg)  BMI 25.23 kg/m2  SpO2 97%   General:   Well developed, well nourished . NAD.  Neck:  Full range of motion. Supple. No  thyromegaly , normal carotid pulse HEENT:  Normocephalic . Face symmetric, atraumatic Lungs:  Decreased breath sounds  otherwise clear Normal respiratory effort, no intercostal retractions, no accessory muscle use. Heart: Distant heart sounds, RRR,  no murmur.  Abdomen:  Not distended, soft, non-tender. No rebound or rigidity. No mass,organomegaly; no bruit Muscle skeletal: no pretibial edema bilaterally  Skin: Exposed areas without rash. Not pale. Not jaundice Neurologic:  alert & oriented X3.  Speech normal, gait appropriate for age and unassisted Strength symmetric and appropriate for age.  Psych: Cognition and judgment appear intact.  Cooperative with normal attention span and concentration.  Behavior appropriate. No  anxious or depressed appearing.      Assessment & Plan:

## 2015-02-15 NOTE — Assessment & Plan Note (Addendum)
No  ambulatory CBGs, continue with metformin, check a A1c

## 2015-02-15 NOTE — Assessment & Plan Note (Addendum)
Seems stable, will switch from Symbicort to [employer]

## 2015-02-15 NOTE — Assessment & Plan Note (Signed)
Managed by urology, last PSA 11-2014 was 0.22

## 2015-02-15 NOTE — Assessment & Plan Note (Signed)
Td 09 pneumonia shot 2005 and 11--2010  prevnar-- 2010 Got a zostavax at the pharmacy ~ 2012    last colonoscopy 06/2009, normal, was told to repeat in 5 years; see previous entry, we agreed no further cscopes for screening   PSA per urology  Diet-exercise discussed

## 2015-02-15 NOTE — Patient Instructions (Signed)
Get your blood work before you leave   Check the  blood pressure   weekly  Be sure your blood pressure is between 110/65 and  150/85.  if it is consistently higher or lower, let me know       Come back to the office in 4 months  for a routine check up        Fall Prevention and [place] cause injuries and can affect all age groups. It is possible to use preventive measures to significantly decrease the likelihood of falls. There are many simple measures which can make your home safer and prevent falls. OUTDOORS  Repair cracks and edges of walkways and driveways.  Remove high doorway thresholds.  Trim shrubbery on the main path into your home.  Have good outside lighting.  Clear walkways of tools, rocks, debris, and clutter.  Check that handrails are not broken and are securely fastened. Both sides of steps should have handrails.  Have leaves, snow, and ice cleared regularly.  Use sand or salt on walkways during winter months.  In the garage, clean up grease or oil spills. BATHROOM  Install night lights.  Install grab bars by the toilet and in the tub and shower.  Use non-skid mats or decals in the tub or shower.  Place a plastic non-slip stool in the shower to sit on, if needed.  Keep floors dry and clean up all water on the floor immediately.  Remove soap buildup in the tub or shower on a regular basis.  Secure bath mats with non-slip, double-sided rug tape.  Remove throw rugs and tripping hazards from the floors. BEDROOMS  Install night lights.  Make sure a bedside light is easy to reach.  Do not use oversized bedding.  Keep a telephone by your bedside.  Have a firm chair with side arms to use for getting dressed.  Remove throw rugs and tripping hazards from the floor. KITCHEN  Keep handles on pots and pans turned toward the center of the stove. Use back burners when possible.  Clean up spills quickly and allow time for drying.  Avoid  walking on wet floors.  Avoid hot utensils and knives.  Position shelves so they are not too high or low.  Place commonly used objects within easy reach.  If necessary, use a sturdy step stool with a grab bar when reaching.  Keep electrical cables out of the way.  Do not use floor polish or wax that makes floors slippery. If you must use wax, use non-skid floor wax.  Remove throw rugs and tripping hazards from the floor. STAIRWAYS  Never leave objects on stairs.  Place handrails on both sides of stairways and use them. Fix any loose handrails. Make sure handrails on both sides of the stairways are as long as the stairs.  Check carpeting to make sure it is firmly attached along stairs. Make repairs to worn or loose carpet promptly.  Avoid placing throw rugs at the top or bottom of stairways, or properly secure the rug with carpet tape to prevent slippage. Get rid of throw rugs, if possible.  Have an electrician put in a light switch at the top and bottom of the stairs. OTHER FALL PREVENTION TIPS  Wear low-heel or rubber-soled shoes that are supportive and fit well. Wear closed toe shoes.  When using a stepladder, make sure it is fully opened and both spreaders are firmly locked. Do not climb a closed stepladder.  Add color or contrast  paint or tape to grab bars and handrails in your home. Place contrasting color strips on first and last steps.  Learn and use mobility aids as needed. Install an electrical emergency response system.  Turn on lights to avoid dark areas. Replace light bulbs that burn out immediately. Get light switches that glow.  Arrange furniture to create clear pathways. Keep furniture in the same place.  Firmly attach carpet with non-skid or double-sided tape.  Eliminate uneven floor surfaces.  Select a carpet pattern that does not visually hide the edge of steps.  Be aware of all pets. OTHER HOME SAFETY TIPS  Set the water temperature for 120 F  (48.8 C).  Keep emergency numbers on or near the telephone.  Keep smoke detectors on every level of the home and near sleeping areas. Document Released: 10/31/2002 Document Revised: 05/11/2012 Document Reviewed: 01/30/2012 [facility] Patient Information 2015 [place], [place]. This information is not intended to replace advice given to you by your health care provider. Make sure you discuss any questions you have with your health care provider.   Preventive Care for Adults Ages 53 and over  Blood pressure check.** / Every 1 to 2 years.  Lipid and cholesterol check.**/ Every 5 years beginning at age 38.  Lung cancer screening. / Every year if you are aged 46-80 years and have a 30-pack-year history of smoking and currently smoke or have quit within the past 15 years. Yearly screening is stopped once you have quit smoking for at least 15 years or develop a health problem that would prevent you from having lung cancer treatment.  Fecal occult blood test (FOBT) of stool. / Every year beginning at age 3 and continuing until age 34. You may not have to do this test if you get a colonoscopy every 10 years.  Flexible sigmoidoscopy** or colonoscopy.** / Every 5 years for a flexible sigmoidoscopy or every 10 years for a colonoscopy beginning at age 23 and continuing until age 40.  Hepatitis C blood test.** / For all people born from [birth year] through 1965 and any individual with known risks for hepatitis C.  Abdominal aortic aneurysm (AAA) screening.** / A one-time screening for ages 46 to 85 years who are current or former smokers.  Skin self-exam. / Monthly.  Influenza vaccine. / Every year.  Tetanus, diphtheria, and acellular pertussis (Tdap/Td) vaccine.** / 1 dose of Td every 10 years.  Varicella vaccine.** / Consult your health care provider.  Zoster vaccine.** / 1 dose for adults aged 52 years or older.  Pneumococcal 13-valent conjugate (PCV13) vaccine.** / Consult your health care  provider.  Pneumococcal polysaccharide (PPSV23) vaccine.** / 1 dose for all adults aged 42 years and older.  Meningococcal vaccine.** / Consult your health care provider.  Hepatitis A vaccine.** / Consult your health care provider.  Hepatitis B vaccine.** / Consult your health care provider.  Haemophilus influenzae type b (Hib) vaccine.** / Consult your health care provider. **Family history and personal history of risk and conditions may change your health care provider's recommendations. Document Released: 01/06/2002 Document Revised: 11/15/2013 Document Reviewed: 04/07/2011 [facility] Patient Information 2015 [place], [place]. This information is not intended to replace advice given to you by your health care provider. Make sure you discuss any questions you have with your health care provider.

## 2015-02-22 MED ORDER — METFORMIN HCL 500 MG PO TABS: ORAL_TABLET | ORAL | Status: DC

## 2015-02-22 NOTE — Addendum Note (Signed)
Addended by: Wilfrid Lund on: 02/22/2015 03:03 PM   Modules accepted: Orders

## 2015-02-26 ENCOUNTER — Telehealth: Payer: Self-pay | Admitting: *Deleted

## 2015-02-26 NOTE — Telephone Encounter (Signed)
Prior authorization for [facility] initiated. Awaiting determination from [facility]. JG//CMA

## 2015-02-27 NOTE — Telephone Encounter (Signed)
PA approved effective 11/24/2014 through 11/24/2015. Approval letter sent for scanning. JG//CMA

## 2015-03-15 ENCOUNTER — Encounter (INDEPENDENT_AMBULATORY_CARE_PROVIDER_SITE_OTHER): Payer: Medicare Other | Admitting: Ophthalmology

## 2015-03-15 DIAGNOSIS — E11319 Type 2 diabetes mellitus with unspecified diabetic retinopathy without macular edema: Secondary | ICD-10-CM | POA: Diagnosis not present

## 2015-03-15 DIAGNOSIS — I10 Essential (primary) hypertension: Secondary | ICD-10-CM | POA: Diagnosis not present

## 2015-03-15 DIAGNOSIS — H3532 Exudative age-related macular degeneration: Secondary | ICD-10-CM | POA: Diagnosis not present

## 2015-03-15 DIAGNOSIS — H33302 Unspecified retinal break, left eye: Secondary | ICD-10-CM | POA: Diagnosis not present

## 2015-03-15 DIAGNOSIS — E11329 Type 2 diabetes mellitus with mild nonproliferative diabetic retinopathy without macular edema: Secondary | ICD-10-CM | POA: Diagnosis not present

## 2015-03-15 DIAGNOSIS — H43813 Vitreous degeneration, bilateral: Secondary | ICD-10-CM

## 2015-03-15 DIAGNOSIS — H35033 Hypertensive retinopathy, bilateral: Secondary | ICD-10-CM | POA: Diagnosis not present

## 2015-03-15 DIAGNOSIS — H3531 Nonexudative age-related macular degeneration: Secondary | ICD-10-CM

## 2015-04-02 ENCOUNTER — Other Ambulatory Visit: Payer: Self-pay

## 2015-04-02 MED ORDER — ALBUTEROL SULFATE (2.5 MG/3ML) 0.083% IN NEBU: 2.5000 mg | INHALATION_SOLUTION | Freq: Four times a day (QID) | RESPIRATORY_TRACT | Status: AC | PRN

## 2015-04-19 ENCOUNTER — Encounter (INDEPENDENT_AMBULATORY_CARE_PROVIDER_SITE_OTHER): Payer: Medicare Other | Admitting: Ophthalmology

## 2015-04-19 DIAGNOSIS — H35033 Hypertensive retinopathy, bilateral: Secondary | ICD-10-CM

## 2015-04-19 DIAGNOSIS — E11329 Type 2 diabetes mellitus with mild nonproliferative diabetic retinopathy without macular edema: Secondary | ICD-10-CM

## 2015-04-19 DIAGNOSIS — I10 Essential (primary) hypertension: Secondary | ICD-10-CM

## 2015-04-19 DIAGNOSIS — H3531 Nonexudative age-related macular degeneration: Secondary | ICD-10-CM | POA: Diagnosis not present

## 2015-04-19 DIAGNOSIS — H3532 Exudative age-related macular degeneration: Secondary | ICD-10-CM | POA: Diagnosis not present

## 2015-04-19 DIAGNOSIS — E10311 Type 1 diabetes mellitus with unspecified diabetic retinopathy with macular edema: Secondary | ICD-10-CM

## 2015-04-30 ENCOUNTER — Telehealth: Payer: Self-pay

## 2015-04-30 NOTE — Telephone Encounter (Signed)
04/30/15 Received copies of 2010 films from [facility] down on dumbwaiter and filed on shelf.[place]

## 2015-05-16 LAB — PSA: PSA: 0.24

## 2015-05-16 LAB — BASIC METABOLIC PANEL
BUN: 15 mg/dL (ref 4–21)
Creatinine: 0.9 mg/dL (ref <=1.3)

## 2015-05-24 ENCOUNTER — Encounter (INDEPENDENT_AMBULATORY_CARE_PROVIDER_SITE_OTHER): Payer: Medicare Other | Admitting: Ophthalmology

## 2015-05-24 DIAGNOSIS — H3532 Exudative age-related macular degeneration: Secondary | ICD-10-CM

## 2015-05-24 DIAGNOSIS — I10 Essential (primary) hypertension: Secondary | ICD-10-CM

## 2015-05-24 DIAGNOSIS — H43813 Vitreous degeneration, bilateral: Secondary | ICD-10-CM

## 2015-05-24 DIAGNOSIS — H35033 Hypertensive retinopathy, bilateral: Secondary | ICD-10-CM | POA: Diagnosis not present

## 2015-05-24 DIAGNOSIS — H33302 Unspecified retinal break, left eye: Secondary | ICD-10-CM

## 2015-05-24 DIAGNOSIS — E11329 Type 2 diabetes mellitus with mild nonproliferative diabetic retinopathy without macular edema: Secondary | ICD-10-CM

## 2015-05-24 DIAGNOSIS — E11319 Type 2 diabetes mellitus with unspecified diabetic retinopathy without macular edema: Secondary | ICD-10-CM | POA: Diagnosis not present

## 2015-06-15 DIAGNOSIS — N281 Cyst of kidney, acquired: Secondary | ICD-10-CM | POA: Diagnosis not present

## 2015-06-15 DIAGNOSIS — D495 Neoplasm of unspecified behavior of other genitourinary organs: Secondary | ICD-10-CM | POA: Diagnosis not present

## 2015-06-15 DIAGNOSIS — Z8546 Personal history of malignant neoplasm of prostate: Secondary | ICD-10-CM | POA: Diagnosis not present

## 2015-06-15 DIAGNOSIS — C642 Malignant neoplasm of left kidney, except renal pelvis: Secondary | ICD-10-CM | POA: Diagnosis not present

## 2015-06-20 ENCOUNTER — Ambulatory Visit: Payer: Medicare Other | Admitting: Internal Medicine

## 2015-06-21 DIAGNOSIS — Z8546 Personal history of malignant neoplasm of prostate: Secondary | ICD-10-CM | POA: Diagnosis not present

## 2015-06-21 DIAGNOSIS — D495 Neoplasm of unspecified behavior of other genitourinary organs: Secondary | ICD-10-CM | POA: Diagnosis not present

## 2015-06-27 ENCOUNTER — Ambulatory Visit (INDEPENDENT_AMBULATORY_CARE_PROVIDER_SITE_OTHER): Payer: Medicare Other | Admitting: Internal Medicine

## 2015-06-27 ENCOUNTER — Encounter: Payer: Self-pay | Admitting: Internal Medicine

## 2015-06-27 VITALS — BP 128/78 | HR 70 | Temp 97.8°F | Ht 67.0 in | Wt 166.5 lb

## 2015-06-27 DIAGNOSIS — Z23 Encounter for immunization: Secondary | ICD-10-CM

## 2015-06-27 DIAGNOSIS — J438 Other emphysema: Secondary | ICD-10-CM | POA: Diagnosis not present

## 2015-06-27 DIAGNOSIS — E119 Type 2 diabetes mellitus without complications: Secondary | ICD-10-CM

## 2015-06-27 DIAGNOSIS — E785 Hyperlipidemia, unspecified: Secondary | ICD-10-CM | POA: Diagnosis not present

## 2015-06-27 DIAGNOSIS — I10 Essential (primary) hypertension: Secondary | ICD-10-CM | POA: Diagnosis not present

## 2015-06-27 LAB — HEPATIC FUNCTION PANEL
ALBUMIN: 4.1 g/dL (ref 3.5–5.2)
ALK PHOS: 95 U/L (ref 39–117)
ALT: 14 U/L (ref 0–53)
AST: 14 U/L (ref 0–37)
BILIRUBIN DIRECT: 0.2 mg/dL (ref 0.0–0.3)
TOTAL PROTEIN: 7.4 g/dL (ref 6.0–8.3)
Total Bilirubin: 0.8 mg/dL (ref 0.2–1.2)

## 2015-06-27 LAB — LIPID PANEL
Cholesterol: 121 mg/dL (ref 0–200)
HDL: 38.7 mg/dL — ABNORMAL LOW (ref >39.00)
LDL Cholesterol: 51 mg/dL (ref 0–99)
NONHDL: 82.28
Total CHOL/HDL Ratio: 3
Triglycerides: 156 mg/dL — ABNORMAL HIGH (ref 0.0–149.0)
VLDL: 31.2 mg/dL (ref 0.0–40.0)

## 2015-06-27 LAB — HEMOGLOBIN A1C: HEMOGLOBIN A1C: 6.5 % (ref 4.6–6.5)

## 2015-06-27 LAB — HM DIABETES FOOT EXAM

## 2015-06-27 NOTE — Patient Instructions (Signed)
Get your blood work before you leave   For cough: Go back on [place] Robitussin-DM as needed If you are not gradually improving please let me know  Don't forget your flu shot this year.

## 2015-06-27 NOTE — Progress Notes (Signed)
Subjective:    Patient ID: Jim [name], male    DOB: 1932-10-09, 81 y.o.   MRN: [id number]  DOS:  06/27/2015 Type of visit - description : Routine visit Interval history: In general feeling well except for a mild cough with clear sputum production for the last 3 or 4 days. He is actually not taking Dulera because is afraid it may increase his blood pressure. Good medication compliance otherwise No ambulatory blood sugars When he checks his BPs at home they are within normal.   Review of Systems  No F/C No simus pain , congestion or d/c No CP, SOB @ baseline Denies lower extremity paresthesias.  Past Medical History  Diagnosis Date  . Type 2 diabetes mellitus 10/2009  . COPD (chronic obstructive pulmonary disease)   . Syncope 2010    Evaluated by Dr. Caryl Comes - normal LVEF, possibly neurally mediated  . Hyperlipidemia   . Essential hypertension, benign   . Prostate cancer 2008    s/p XRT  . ED (erectile dysfunction)   . Cholestatic hepatitis 2014  . Personal history of colonic polyps   . Renal mass 08/12/2014    Past Surgical History  Procedure Laterality Date  . Cataract extraction Bilateral   . Eye surgery Left 07/17/2014  . Colonoscopy    . Ercp N/A 08/16/2014    Procedure: ENDOSCOPIC RETROGRADE CHOLANGIOPANCREATOGRAPHY (ERCP);  Surgeon: Milus Banister, MD;  Location: [facility];  Service: Endoscopy;  Laterality: N/A;  . Cholecystectomy N/A 08/22/2014    Procedure: LAPAROSCOPIC CHOLECYSTECTOMY WITH INTRAOPERATIVE CHOLANGIOGRAM;  Surgeon: Gayland Curry, MD;  Location: [facility];  Service: General;  Laterality: N/A;    History   Social History  . Marital Status: Divorced    Spouse Name: N/A  . Number of Children: 2  . Years of Education: N/A   Occupational History  . Retired     Social History Main Topics  . Smoking status: Former Smoker -- 2.00 packs/day for 40 years    Types: Cigarettes    Quit date: 11/24/2005  . Smokeless tobacco: Never Used  . Alcohol  Use: 0.0 oz/week     Comment: Rarely  . Drug Use: No  . Sexual Activity: Not on file   Other Topics Concern  . Not on file   Social History Narrative   Independent on ADL    Lives with son and daughter in law   Has Gk and GGk        Medication List       This list is accurate as of: 06/27/15 11:59 PM.  Always use your most recent med list.               albuterol (2.5 MG/3ML) 0.083% nebulizer solution  Commonly known as:  PROVENTIL  Take 3 mLs (2.5 mg total) by nebulization 4 (four) times daily as needed.     aspirin 81 MG tablet  Take 81 mg by mouth daily.     atorvastatin 20 MG tablet  Commonly known as:  LIPITOR  Take 1 tablet (20 mg total) by mouth daily.     carvedilol 25 MG tablet  Commonly known as:  COREG  Take 1 tablet (25 mg total) by mouth 2 (two) times daily with a meal.     furosemide 40 MG tablet  Commonly known as:  LASIX  Take 1 tablet (40 mg total) by mouth 2 (two) times daily.     Ipratropium-Albuterol 20-100 MCG/ACT Aers respimat  Commonly known  as:  COMBIVENT RESPIMAT  Inhale 1 puff into the lungs every 6 (six) hours as needed for wheezing.     losartan 100 MG tablet  Commonly known as:  COZAAR  Take 1 tablet (100 mg total) by mouth daily.     metFORMIN 500 MG tablet  Commonly known as:  GLUCOPHAGE  Take 2 tablets in the morning with breakfast and 1 tablet in the evening with dinner.     mometasone-formoterol 200-5 MCG/ACT Aero  Commonly known as:  DULERA  Inhale 2 puffs into the lungs 2 (two) times daily.     PRESERVISION AREDS PO  Take 2 tablets by mouth daily.           Objective:   Physical Exam BP 128/78 mmHg  Pulse 70  Temp(Src) 97.8 F (36.6 C) (Oral)  Ht 5\' 7"  (1.702 m)  Wt 166 lb 8 oz (75.524 kg)  BMI 26.07 kg/m2  SpO2 95% General:   Well developed, well nourished . NAD.  HEENT:  Normocephalic . Face symmetric, atraumatic. Sinuses no TTP Lungs:  Decreased breath sounds otherwise clear Normal respiratory  effort, no intercostal retractions, no accessory muscle use. Heart: RRR,  no murmur.  No pretibial edema bilaterally  Diabetic foot exam: Nails dystrophic, good pedal pulses, pinprick examination normal. No edema. Skin normal Neurologic:  alert & oriented X3.  Speech normal, gait appropriate for age and unassisted Psych--  Cognition and judgment appear intact.  Cooperative with normal attention span and concentration.  Behavior appropriate. No anxious or depressed appearing.      Assessment & Plan:   Diabetes:  Good compliance w/ medication, check a A1c. Feet exam normal today  High cholesterol: Check FLP and LFTs  Hypertension: Well controlled  COPD: Cough for 3 day, exam is benign. Recommend to go back on dulera. See instructions. Knows to call me if not improving

## 2015-06-27 NOTE — Progress Notes (Signed)
Pre visit review using our clinic review tool, if applicable. No additional management support is needed unless otherwise documented below in the visit note. 

## 2015-07-05 ENCOUNTER — Encounter (INDEPENDENT_AMBULATORY_CARE_PROVIDER_SITE_OTHER): Payer: Medicare Other | Admitting: Ophthalmology

## 2015-07-05 DIAGNOSIS — E11329 Type 2 diabetes mellitus with mild nonproliferative diabetic retinopathy without macular edema: Secondary | ICD-10-CM | POA: Diagnosis not present

## 2015-07-05 DIAGNOSIS — H43813 Vitreous degeneration, bilateral: Secondary | ICD-10-CM

## 2015-07-05 DIAGNOSIS — E11319 Type 2 diabetes mellitus with unspecified diabetic retinopathy without macular edema: Secondary | ICD-10-CM

## 2015-07-05 DIAGNOSIS — I10 Essential (primary) hypertension: Secondary | ICD-10-CM

## 2015-07-05 DIAGNOSIS — H3532 Exudative age-related macular degeneration: Secondary | ICD-10-CM | POA: Diagnosis not present

## 2015-07-05 DIAGNOSIS — H35033 Hypertensive retinopathy, bilateral: Secondary | ICD-10-CM | POA: Diagnosis not present

## 2015-07-18 DIAGNOSIS — H02831 Dermatochalasis of right upper eyelid: Secondary | ICD-10-CM | POA: Diagnosis not present

## 2015-07-18 DIAGNOSIS — Z961 Presence of intraocular lens: Secondary | ICD-10-CM | POA: Diagnosis not present

## 2015-07-18 DIAGNOSIS — E119 Type 2 diabetes mellitus without complications: Secondary | ICD-10-CM | POA: Diagnosis not present

## 2015-07-18 DIAGNOSIS — H35371 Puckering of macula, right eye: Secondary | ICD-10-CM | POA: Diagnosis not present

## 2015-07-18 DIAGNOSIS — H3532 Exudative age-related macular degeneration: Secondary | ICD-10-CM | POA: Diagnosis not present

## 2015-07-18 DIAGNOSIS — H02834 Dermatochalasis of left upper eyelid: Secondary | ICD-10-CM | POA: Diagnosis not present

## 2015-08-02 ENCOUNTER — Encounter: Payer: Self-pay | Admitting: Internal Medicine

## 2015-08-02 ENCOUNTER — Ambulatory Visit (INDEPENDENT_AMBULATORY_CARE_PROVIDER_SITE_OTHER): Payer: Medicare Other | Admitting: Internal Medicine

## 2015-08-02 ENCOUNTER — Ambulatory Visit (INDEPENDENT_AMBULATORY_CARE_PROVIDER_SITE_OTHER)
Admission: RE | Admit: 2015-08-02 | Discharge: 2015-08-02 | Disposition: A | Discharge status: Home / Self Care | Payer: Medicare Other | Source: Ambulatory Visit | Attending: Internal Medicine | Admitting: Internal Medicine

## 2015-08-02 VITALS — BP 114/68 | HR 64 | Ht 70.0 in | Wt 166.2 lb

## 2015-08-02 DIAGNOSIS — J449 Chronic obstructive pulmonary disease, unspecified: Secondary | ICD-10-CM

## 2015-08-02 DIAGNOSIS — Z23 Encounter for immunization: Secondary | ICD-10-CM | POA: Diagnosis not present

## 2015-08-02 DIAGNOSIS — R918 Other nonspecific abnormal finding of lung field: Secondary | ICD-10-CM | POA: Diagnosis not present

## 2015-08-02 DIAGNOSIS — I10 Essential (primary) hypertension: Secondary | ICD-10-CM | POA: Diagnosis not present

## 2015-08-02 DIAGNOSIS — J984 Other disorders of lung: Secondary | ICD-10-CM | POA: Diagnosis not present

## 2015-08-02 NOTE — Progress Notes (Signed)
Patient ID: Jim [name], male   DOB: 1932/10/07,     MRN: [id number]     Brief patient profile:   42 yowm former smoker quit in 1977 with GOLD I copd documented 2011 previously followed by Dr Gwenette Greet    History of Present Illness  08/02/2015 1st [facility] office visit/ Aleda Madl re: GOLD I copd on dulera 200/ combivent rarely   Chief Complaint  Patient presents with  . Follow-up    former [place] pt.-c/o sob worse with humidity,Dulera helping,no cough,denies cp,tightness,wheezing.    Not limited by breathing from desired activities  / not very active/ has neb but never uses   No obvious day to day or daytime variability or assoc chronic cough or cp or chest tightness, subjective wheeze or overt sinus or hb symptoms. No unusual exp hx or h/o childhood pna/ asthma or knowledge of premature birth.  Sleeping ok without nocturnal  or early am exacerbation  of respiratory  c/o's or need for noct saba. Also denies any obvious fluctuation of symptoms with weather or environmental changes or other aggravating or alleviating factors except as outlined above   Current Medications, Allergies, Complete Past Medical History, Past Surgical History, Family History, and Social History were reviewed in [employer] record.  ROS  The following are not active complaints unless bolded sore throat, dysphagia, dental problems, itching, sneezing,  nasal congestion or excess/ purulent secretions, ear ache,   fever, chills, sweats, unintended wt loss, classically pleuritic or exertional cp, hemoptysis,  orthopnea pnd or leg swelling, presyncope, palpitations, abdominal pain, anorexia, nausea, vomiting, diarrhea  or change in bowel or bladder habits, change in stools or urine, dysuria,hematuria,  rash, arthralgias, visual complaints, headache, numbness, weakness or ataxia or problems with walking or coordination,  change in mood/affect or memory.      Objective:   Physical Exam    amb wm   nad slt gruff voice     Wt Readings from Last 3 Encounters:  08/02/15 166 lb 3.2 oz (75.388 kg)  06/27/15 166 lb 8 oz (75.524 kg)  02/15/15 168 lb 6 oz (76.374 kg)    Vital signs reviewed        HEENT:  Edentulous/ nl  turbinates, and orophanx. Nl external ear canals without cough reflex   NECK :  without JVD/Nodes/TM/ nl carotid upstrokes bilaterally   LUNGS: no acc muscle use,  Slt distant bs bilaterally/ mild hyperresonance no wheeze    CV:  RRR  no s3 or murmur or increase in P2, no edema   ABD:  soft and nontender with nl excursion in the supine position. No bruits or organomegaly, bowel sounds nl  MS:  warm without deformities, calf tenderness, cyanosis or clubbing  SKIN: warm and dry without lesions    NEURO:  alert, approp, no deficits     I personally reviewed images and agree with radiology impression as follows:  CXR:    New focal density seen in right perihilar region which may represent focal inflammation or atelectasis. Followup radiographs are recommended to ensure resolution, and rule out underlying neoplasm.     Assessment:

## 2015-08-02 NOTE — Patient Instructions (Signed)
Please remember to go to the   x-ray department downstairs for your tests - we will call you with the results when they are available.   If you are satisfied with your treatment plan,  let your doctor know and he/she can either refill your medications or you can return here when your prescription runs out.     If in any way you are not 100% satisfied,  please tell us.  If 100% better, tell your friends!  Pulmonary follow up is as needed        

## 2015-08-03 ENCOUNTER — Institutional Professional Consult (permissible substitution): Payer: Medicare Other | Admitting: Pulmonary Disease

## 2015-08-03 ENCOUNTER — Ambulatory Visit: Payer: Medicare Other | Admitting: Pulmonary Disease

## 2015-08-03 ENCOUNTER — Telehealth: Payer: Self-pay | Admitting: Internal Medicine

## 2015-08-03 ENCOUNTER — Encounter: Payer: Self-pay | Admitting: Internal Medicine

## 2015-08-03 DIAGNOSIS — R918 Other nonspecific abnormal finding of lung field: Secondary | ICD-10-CM | POA: Insufficient documentation

## 2015-08-03 NOTE — Assessment & Plan Note (Addendum)
Quit smoking 1977 PFT's 2009:  FEV1 2.48 (90%), ratio 50, leak on lung volumes, DLCO 53% ONO RA 12/2012:  Low sat 69%, but only 52min less than 88%.   The proper method of use, as well as anticipated side effects, of a metered-dose inhaler are discussed and demonstrated to the patient. Improved effectiveness after extensive coaching during this visit to a level of approximately  90% from a baseline of 75% so rec continue dulera 200 with low threshold to change coreg to bisoprolol if breathing worse or increase need fore combivent   I had an extended discussion with the patient reviewing all relevant studies completed to date and  lasting 15 to 20 minutes of a 25 minute visit    Each maintenance medication was reviewed in detail including most importantly the difference between maintenance and prns and under what circumstances the prns are to be triggered using an action plan format that is not reflected in the computer generated alphabetically organized AVS.    Please see instructions for details which were reviewed in writing and the patient given a copy highlighting the part that I personally wrote and discussed at today's ov.

## 2015-08-03 NOTE — Assessment & Plan Note (Signed)
Adequate control on present rx, reviewed > no change in rx needed for now but note on relatively high doses of coreg >> if any worse sob   Strongly prefer in this setting: Bystolic, the most beta -1  selective Beta blocker available in sample form, with bisoprolol the most selective generic choice  on the market.

## 2015-08-03 NOTE — Progress Notes (Signed)
Quick Note:  Correction- Per MW this needs to be HRCT ______

## 2015-08-03 NOTE — Assessment & Plan Note (Addendum)
In the absence of any signs/ symptoms of infection we need to exclude neoplasm here but looking at previous scans dating back to 2011 he's had vague infiltrates that have presumed to have been inflammatory that have resolved on f/u so this is probably just another example (? Low grade MAI?/bronchiectasis> HRCT best option

## 2015-08-03 NOTE — Telephone Encounter (Signed)
I called and spoke with the pt about his cxr results  He verbalized understanding  Order was sent to [facility]

## 2015-08-14 ENCOUNTER — Inpatient Hospital Stay: Admission: RE | Admit: 2015-08-14 | Payer: Medicare Other | Source: Ambulatory Visit

## 2015-08-15 ENCOUNTER — Ambulatory Visit (INDEPENDENT_AMBULATORY_CARE_PROVIDER_SITE_OTHER)
Admission: RE | Admit: 2015-08-15 | Discharge: 2015-08-15 | Disposition: A | Discharge status: Home / Self Care | Payer: Medicare Other | Source: Ambulatory Visit | Attending: Internal Medicine | Admitting: Internal Medicine

## 2015-08-15 DIAGNOSIS — R911 Solitary pulmonary nodule: Secondary | ICD-10-CM | POA: Diagnosis not present

## 2015-08-15 DIAGNOSIS — R918 Other nonspecific abnormal finding of lung field: Secondary | ICD-10-CM | POA: Diagnosis not present

## 2015-08-15 DIAGNOSIS — R0602 Shortness of breath: Secondary | ICD-10-CM | POA: Diagnosis not present

## 2015-08-16 ENCOUNTER — Encounter (INDEPENDENT_AMBULATORY_CARE_PROVIDER_SITE_OTHER): Payer: Medicare Other | Admitting: Ophthalmology

## 2015-08-16 DIAGNOSIS — H33302 Unspecified retinal break, left eye: Secondary | ICD-10-CM

## 2015-08-16 DIAGNOSIS — H35033 Hypertensive retinopathy, bilateral: Secondary | ICD-10-CM

## 2015-08-16 DIAGNOSIS — I10 Essential (primary) hypertension: Secondary | ICD-10-CM | POA: Diagnosis not present

## 2015-08-16 DIAGNOSIS — E11319 Type 2 diabetes mellitus with unspecified diabetic retinopathy without macular edema: Secondary | ICD-10-CM | POA: Diagnosis not present

## 2015-08-16 DIAGNOSIS — E11329 Type 2 diabetes mellitus with mild nonproliferative diabetic retinopathy without macular edema: Secondary | ICD-10-CM

## 2015-08-16 DIAGNOSIS — H43813 Vitreous degeneration, bilateral: Secondary | ICD-10-CM

## 2015-08-16 DIAGNOSIS — H3532 Exudative age-related macular degeneration: Secondary | ICD-10-CM

## 2015-08-16 DIAGNOSIS — R911 Solitary pulmonary nodule: Secondary | ICD-10-CM | POA: Insufficient documentation

## 2015-09-27 ENCOUNTER — Encounter (INDEPENDENT_AMBULATORY_CARE_PROVIDER_SITE_OTHER): Payer: Medicare Other | Admitting: Ophthalmology

## 2015-09-27 DIAGNOSIS — E113213 Type 2 diabetes mellitus with mild nonproliferative diabetic retinopathy with macular edema, bilateral: Secondary | ICD-10-CM

## 2015-09-27 DIAGNOSIS — H43813 Vitreous degeneration, bilateral: Secondary | ICD-10-CM | POA: Diagnosis not present

## 2015-09-27 DIAGNOSIS — I10 Essential (primary) hypertension: Secondary | ICD-10-CM | POA: Diagnosis not present

## 2015-09-27 DIAGNOSIS — H35033 Hypertensive retinopathy, bilateral: Secondary | ICD-10-CM | POA: Diagnosis not present

## 2015-09-27 DIAGNOSIS — H33302 Unspecified retinal break, left eye: Secondary | ICD-10-CM

## 2015-09-27 DIAGNOSIS — H353231 Exudative age-related macular degeneration, bilateral, with active choroidal neovascularization: Secondary | ICD-10-CM

## 2015-09-27 DIAGNOSIS — E11319 Type 2 diabetes mellitus with unspecified diabetic retinopathy without macular edema: Secondary | ICD-10-CM | POA: Diagnosis not present

## 2015-10-29 ENCOUNTER — Ambulatory Visit (INDEPENDENT_AMBULATORY_CARE_PROVIDER_SITE_OTHER): Payer: Medicare Other | Admitting: Internal Medicine

## 2015-10-29 ENCOUNTER — Encounter: Payer: Self-pay | Admitting: Internal Medicine

## 2015-10-29 VITALS — BP 120/70 | HR 64 | Temp 97.7°F | Ht 70.0 in | Wt 162.8 lb

## 2015-10-29 DIAGNOSIS — E119 Type 2 diabetes mellitus without complications: Secondary | ICD-10-CM | POA: Diagnosis not present

## 2015-10-29 DIAGNOSIS — I10 Essential (primary) hypertension: Secondary | ICD-10-CM | POA: Diagnosis not present

## 2015-10-29 DIAGNOSIS — Z09 Encounter for follow-up examination after completed treatment for conditions other than malignant neoplasm: Secondary | ICD-10-CM

## 2015-10-29 LAB — BASIC METABOLIC PANEL
BUN: 15 mg/dL (ref 6–23)
CALCIUM: 9.1 mg/dL (ref 8.4–10.5)
CO2: 27 meq/L (ref 19–32)
Chloride: 102 mEq/L (ref 96–112)
Creatinine, Ser: 0.94 mg/dL (ref 0.40–1.50)
GFR: 81.81 mL/min (ref >60.00)
GLUCOSE: 105 mg/dL — AB (ref 70–99)
Potassium: 4.1 mEq/L (ref 3.5–5.1)
SODIUM: 140 meq/L (ref 135–145)

## 2015-10-29 LAB — HEMOGLOBIN A1C: Hgb A1c MFr Bld: 6 % (ref 4.6–6.5)

## 2015-10-29 NOTE — Progress Notes (Signed)
Subjective:    Patient ID: Jim [name], male    DOB: [birth year], 81 y.o.   MRN: [id number]  DOS:  10/29/2015 Type of visit - description : Routine visit  Interval history: DM: No ambulatory CBGs, diet is very good. Good compliance with metformin HTN: Good compliance with medications, BP today is very good COPD: Had a flu shot, occasionally has cough. Symptoms at baseline   Review of Systems  Denies chest pain, lower extremity edema No nausea vomiting No hemoptysis  Past Medical History  Diagnosis Date  . Type 2 diabetes mellitus ([place]) 10/2009  . COPD (chronic obstructive pulmonary disease) ([place])   . Syncope 2010    Evaluated by Dr. Caryl Comes - normal LVEF, possibly neurally mediated  . Hyperlipidemia   . Essential hypertension, benign   . Prostate cancer ([place]) 2008    s/p XRT  . ED (erectile dysfunction)   . Cholestatic hepatitis 2014  . Personal history of colonic polyps   . Renal mass 08/12/2014    Past Surgical History  Procedure Laterality Date  . Cataract extraction Bilateral   . Eye surgery Left 07/17/2014  . Colonoscopy    . Ercp N/A 08/16/2014    Procedure: ENDOSCOPIC RETROGRADE CHOLANGIOPANCREATOGRAPHY (ERCP);  Surgeon: Milus Banister, MD;  Location: [facility];  Service: Endoscopy;  Laterality: N/A;  . Cholecystectomy N/A 08/22/2014    Procedure: LAPAROSCOPIC CHOLECYSTECTOMY WITH INTRAOPERATIVE CHOLANGIOGRAM;  Surgeon: Gayland Curry, MD;  Location: [facility];  Service: General;  Laterality: N/A;    Social History   Social History  . Marital Status: Divorced    Spouse Name: N/A  . Number of Children: 2  . Years of Education: N/A   Occupational History  . Retired     Social History Main Topics  . Smoking status: Former Smoker -- 2.00 packs/day for 40 years    Types: Cigarettes    Quit date: 11/24/2005  . Smokeless tobacco: Never Used  . Alcohol Use: 0.0 oz/week     Comment: Rarely  . Drug Use: No  . Sexual Activity: Not on file   Other Topics  Concern  . Not on file   Social History Narrative   Independent on ADL    Lives with son and daughter in law   Has Gk and GGk        Medication List       This list is accurate as of: 10/29/15  6:47 PM.  Always use your most recent med list.               albuterol (2.5 MG/3ML) 0.083% nebulizer solution  Commonly known as:  PROVENTIL  Take 3 mLs (2.5 mg total) by nebulization 4 (four) times daily as needed.     aspirin 81 MG tablet  Take 81 mg by mouth daily.     atorvastatin 20 MG tablet  Commonly known as:  LIPITOR  Take 1 tablet (20 mg total) by mouth daily.     BESIVANCE 0.6 % Susp  Generic drug:  Besifloxacin HCl  As needed  Both eyes     carvedilol 25 MG tablet  Commonly known as:  COREG  Take 1 tablet (25 mg total) by mouth 2 (two) times daily with a meal.     furosemide 40 MG tablet  Commonly known as:  LASIX  Take 1 tablet (40 mg total) by mouth 2 (two) times daily.     Ipratropium-Albuterol 20-100 MCG/ACT Aers respimat  Commonly known as:  COMBIVENT RESPIMAT  Inhale 1 puff into the lungs every 6 (six) hours as needed for wheezing.     losartan 100 MG tablet  Commonly known as:  COZAAR  Take 1 tablet (100 mg total) by mouth daily.     metFORMIN 500 MG tablet  Commonly known as:  GLUCOPHAGE  Take 2 tablets in the morning with breakfast and 1 tablet in the evening with dinner.     mometasone-formoterol 200-5 MCG/ACT Aero  Commonly known as:  DULERA  Inhale 2 puffs into the lungs 2 (two) times daily.     PRESERVISION AREDS PO  Take 2 tablets by mouth daily.           Objective:   Physical Exam BP 120/70 mmHg  Pulse 64  Temp(Src) 97.7 F (36.5 C) (Oral)  Ht 5\' 10"  (1.778 m)  Wt 162 lb 12.8 oz (73.846 kg)  BMI 23.36 kg/m2  SpO2 96% General:   Well developed, well nourished . NAD.  HEENT:  Normocephalic . Face symmetric, atraumatic Lungs:  Decreased breath sounds Normal respiratory effort, no intercostal retractions, no accessory  muscle use. Heart: RRR,  no murmur.  No pretibial edema bilaterally  Skin: Not pale. Not jaundice Neurologic:  alert & oriented X3.  Speech normal, gait appropriate for age and unassisted Psych--  Cognition and judgment appear intact.  Cooperative with normal attention span and concentration.  Behavior appropriate. No anxious or depressed appearing.      Assessment & Plan:  Assessment > DM HTN Hyperlipidemia COPD Renal mass 9- 2015, dr Karsten Ro, next CT rx per urology for 11-2015  H/o Prostate cancer 2008 XRT Syncope 2010 saw Dr. Caryl Comes  PLAN: DM: Seems to be doing well on metformin, check A1c HTN: Good compliance with medications, BP today is very good, check a BMP Emphysema: Sees pulmonary, had a flu shot RTC March 2017 for a physical

## 2015-10-29 NOTE — Patient Instructions (Signed)
Get your blood work before you leave      Next visit  for a  Physical by 01-2016    Please schedule an appointment at the front desk Please come back fasting

## 2015-10-29 NOTE — Assessment & Plan Note (Signed)
DM: Seems to be doing well on metformin, check A1c HTN: Good compliance with medications, BP today is very good, check a BMP Emphysema: Sees pulmonary, had a flu shot RTC March 2017 for a physical

## 2015-10-29 NOTE — Progress Notes (Signed)
Pre visit review using our clinic review tool, if applicable. No additional management support is needed unless otherwise documented below in the visit note. 

## 2015-11-08 ENCOUNTER — Encounter (INDEPENDENT_AMBULATORY_CARE_PROVIDER_SITE_OTHER): Payer: Medicare Other | Admitting: Ophthalmology

## 2015-11-08 DIAGNOSIS — H43813 Vitreous degeneration, bilateral: Secondary | ICD-10-CM | POA: Diagnosis not present

## 2015-11-08 DIAGNOSIS — H33302 Unspecified retinal break, left eye: Secondary | ICD-10-CM

## 2015-11-08 DIAGNOSIS — E113213 Type 2 diabetes mellitus with mild nonproliferative diabetic retinopathy with macular edema, bilateral: Secondary | ICD-10-CM | POA: Diagnosis not present

## 2015-11-08 DIAGNOSIS — H353231 Exudative age-related macular degeneration, bilateral, with active choroidal neovascularization: Secondary | ICD-10-CM

## 2015-11-08 DIAGNOSIS — E11311 Type 2 diabetes mellitus with unspecified diabetic retinopathy with macular edema: Secondary | ICD-10-CM

## 2015-11-08 DIAGNOSIS — I10 Essential (primary) hypertension: Secondary | ICD-10-CM

## 2015-11-08 DIAGNOSIS — H35033 Hypertensive retinopathy, bilateral: Secondary | ICD-10-CM | POA: Diagnosis not present

## 2015-11-21 ENCOUNTER — Other Ambulatory Visit: Payer: Self-pay | Admitting: Internal Medicine

## 2015-11-28 ENCOUNTER — Other Ambulatory Visit: Payer: Self-pay | Admitting: Internal Medicine

## 2015-12-02 ENCOUNTER — Other Ambulatory Visit: Payer: Self-pay | Admitting: Internal Medicine

## 2015-12-19 DIAGNOSIS — Z8546 Personal history of malignant neoplasm of prostate: Secondary | ICD-10-CM | POA: Diagnosis not present

## 2015-12-19 LAB — PSA: PSA: 0.16

## 2015-12-20 ENCOUNTER — Encounter (INDEPENDENT_AMBULATORY_CARE_PROVIDER_SITE_OTHER): Payer: Medicare Other | Admitting: Ophthalmology

## 2015-12-20 DIAGNOSIS — H35033 Hypertensive retinopathy, bilateral: Secondary | ICD-10-CM | POA: Diagnosis not present

## 2015-12-20 DIAGNOSIS — H43813 Vitreous degeneration, bilateral: Secondary | ICD-10-CM

## 2015-12-20 DIAGNOSIS — E113213 Type 2 diabetes mellitus with mild nonproliferative diabetic retinopathy with macular edema, bilateral: Secondary | ICD-10-CM | POA: Diagnosis not present

## 2015-12-20 DIAGNOSIS — I10 Essential (primary) hypertension: Secondary | ICD-10-CM

## 2015-12-20 DIAGNOSIS — H353231 Exudative age-related macular degeneration, bilateral, with active choroidal neovascularization: Secondary | ICD-10-CM | POA: Diagnosis not present

## 2015-12-20 DIAGNOSIS — E11311 Type 2 diabetes mellitus with unspecified diabetic retinopathy with macular edema: Secondary | ICD-10-CM

## 2015-12-25 DIAGNOSIS — Z0000 Encounter for general adult medical examination without abnormal findings: Secondary | ICD-10-CM | POA: Diagnosis not present

## 2015-12-25 DIAGNOSIS — D49512 Neoplasm of unspecified behavior of left kidney: Secondary | ICD-10-CM | POA: Diagnosis not present

## 2015-12-25 DIAGNOSIS — Z8546 Personal history of malignant neoplasm of prostate: Secondary | ICD-10-CM | POA: Diagnosis not present

## 2015-12-28 ENCOUNTER — Encounter: Payer: Self-pay | Admitting: Internal Medicine

## 2016-01-29 ENCOUNTER — Telehealth: Payer: Self-pay | Admitting: *Deleted

## 2016-01-29 DIAGNOSIS — R911 Solitary pulmonary nodule: Secondary | ICD-10-CM

## 2016-01-29 NOTE — Telephone Encounter (Signed)
-----   Message from Tanda Rockers, MD sent at 01/29/2016  8:41 AM EST ----- Yes, sorry should be hrct ----- Message -----    From: Rosana Berger, CMA    Sent: 01/28/2016   5:40 PM      To: Tanda Rockers, MD  Should this be a high res scan? Or regular? Thanks! ----- Message -----    From: Tanda Rockers, MD    Sent: 12/03/2015   5:00 PM      To: Rosana Berger, CMA  Make sure he has f/u ct by now f/u spn

## 2016-01-31 ENCOUNTER — Encounter: Payer: Self-pay | Admitting: Internal Medicine

## 2016-01-31 ENCOUNTER — Encounter (INDEPENDENT_AMBULATORY_CARE_PROVIDER_SITE_OTHER): Payer: Medicare Other | Admitting: Ophthalmology

## 2016-01-31 DIAGNOSIS — H35033 Hypertensive retinopathy, bilateral: Secondary | ICD-10-CM | POA: Diagnosis not present

## 2016-01-31 DIAGNOSIS — H43813 Vitreous degeneration, bilateral: Secondary | ICD-10-CM | POA: Diagnosis not present

## 2016-01-31 DIAGNOSIS — H353231 Exudative age-related macular degeneration, bilateral, with active choroidal neovascularization: Secondary | ICD-10-CM | POA: Diagnosis not present

## 2016-01-31 DIAGNOSIS — I10 Essential (primary) hypertension: Secondary | ICD-10-CM | POA: Diagnosis not present

## 2016-01-31 LAB — HM DIABETES EYE EXAM

## 2016-02-01 ENCOUNTER — Other Ambulatory Visit: Payer: Self-pay | Admitting: Internal Medicine

## 2016-02-01 DIAGNOSIS — R911 Solitary pulmonary nodule: Secondary | ICD-10-CM

## 2016-02-04 ENCOUNTER — Ambulatory Visit (INDEPENDENT_AMBULATORY_CARE_PROVIDER_SITE_OTHER)
Admission: RE | Admit: 2016-02-04 | Discharge: 2016-02-04 | Disposition: A | Discharge status: Home / Self Care | Payer: Medicare Other | Source: Ambulatory Visit | Attending: Internal Medicine | Admitting: Internal Medicine

## 2016-02-04 DIAGNOSIS — R911 Solitary pulmonary nodule: Secondary | ICD-10-CM

## 2016-02-04 DIAGNOSIS — R918 Other nonspecific abnormal finding of lung field: Secondary | ICD-10-CM

## 2016-02-04 DIAGNOSIS — J432 Centrilobular emphysema: Secondary | ICD-10-CM | POA: Diagnosis not present

## 2016-02-04 NOTE — Progress Notes (Signed)
Quick Note:  Spoke with pt and notified of results per Dr. Wert. Pt verbalized understanding and denied any questions.  ______ 

## 2016-02-15 ENCOUNTER — Other Ambulatory Visit: Payer: Self-pay

## 2016-02-15 MED ORDER — ATORVASTATIN CALCIUM 20 MG PO TABS: 20.0000 mg | ORAL_TABLET | Freq: Every day | ORAL | Status: DC

## 2016-02-25 ENCOUNTER — Ambulatory Visit (INDEPENDENT_AMBULATORY_CARE_PROVIDER_SITE_OTHER): Payer: Medicare Other | Admitting: Internal Medicine

## 2016-02-25 ENCOUNTER — Encounter: Payer: Self-pay | Admitting: Internal Medicine

## 2016-02-25 VITALS — BP 112/70 | HR 76 | Ht 70.5 in | Wt 162.8 lb

## 2016-02-25 DIAGNOSIS — J9611 Chronic respiratory failure with hypoxia: Secondary | ICD-10-CM

## 2016-02-25 DIAGNOSIS — R918 Other nonspecific abnormal finding of lung field: Secondary | ICD-10-CM

## 2016-02-25 DIAGNOSIS — J449 Chronic obstructive pulmonary disease, unspecified: Secondary | ICD-10-CM | POA: Diagnosis not present

## 2016-02-25 NOTE — Assessment & Plan Note (Signed)
ONO RA 12/2012:  Low sat 69%, but only 25min less than 88%. - ONO RA rec 02/25/2016 >>>   Presently really not using 02 appropriately so rec repeat and unless very convincing evidence he needs it will d/c

## 2016-02-25 NOTE — Assessment & Plan Note (Signed)
see cxr 08/02/2015  - HRCT rec  08/15/15 1. No imaging findings to suggest interstitial lung disease. 2. Diffuse bronchial wall thickening with severe centrilobular emphysema including advanced bullous changes, as above, compatible with the reported clinical history of COPD. 3. The opacity noted on the recent chest x-ray 08/02/2015 likely reflected an area of infection in the right lower lobe. On today's examination, there is no residual airspace consolidation. However, there is increasing septal thickening throughout an area of general bullous disease, with an appearance suggestive of a recently resolved area of infection. 4. New pulmonary nodule in the periphery of the left upper lobe with a mean diameter of 7 mm> in computer for recall 02/06/16  - HRCT 02/04/16 Advanced centrilobular emphysema. Scattered pulmonary parenchymal scarring. No evidence of interstitial lung disease or mycobacterium avium complex. 2. Interval clearing of a nodular density seen in the peripheral left upper lobe on the prior exam > no f/u needed

## 2016-02-25 NOTE — Progress Notes (Signed)
Patient ID: Jim [name], male   DOB: May 21, 1932      MRN: [id number]     Brief patient profile:   63 yowm former smoker quit in 1977 with GOLD I copd documented 2011 previously followed by Dr Gwenette Greet    History of Present Illness  08/02/2015 1st [facility] office visit/ Bennet Kujawa re: GOLD I copd on dulera 200/ combivent rarely   Chief Complaint  Patient presents with  . Follow-up    former [facility] pt.-c/o sob worse with humidity,Dulera helping,no cough,denies cp,tightness,wheezing.  Not limited by breathing from desired activities  / not very active/ has neb but never uses  rec  No change rx     02/25/2016  f/u ov/Brailon Don re: COPD Gold I on dulera 200 2 bid / prn combivent  ? ILD?Pulmonary nodule  Chief Complaint  Patient presents with  . Follow-up    Breathing is doing well. He is using combivent 2 x daily and neb 1 x per wk on average.   doe = MMRC2 = can't walk a nl pace on a flat grade s sob but ok slow and flat Has 02 uses once a month at hs x  years    No obvious day to day or daytime variability or assoc chronic cough or cp or chest tightness, subjective wheeze or overt sinus or hb symptoms. No unusual exp hx or h/o childhood pna/ asthma or knowledge of premature birth.  Sleeping ok without nocturnal  or early am exacerbation  of respiratory  c/o's or need for noct saba. Also denies any obvious fluctuation of symptoms with weather or environmental changes or other aggravating or alleviating factors except as outlined above   Current Medications, Allergies, Complete Past Medical History, Past Surgical History, Family History, and Social History were reviewed in [employer] record.  ROS  The following are not active complaints unless bolded sore throat, dysphagia, dental problems, itching, sneezing,  nasal congestion or excess/ purulent secretions, ear ache,   fever, chills, sweats, unintended wt loss, classically pleuritic or exertional cp, hemoptysis,   orthopnea pnd or leg swelling, presyncope, palpitations, abdominal pain, anorexia, nausea, vomiting, diarrhea  or change in bowel or bladder habits, change in stools or urine, dysuria,hematuria,  rash, arthralgias, visual complaints, headache, numbness, weakness or ataxia or problems with walking or coordination,  change in mood/affect or memory.      Objective:   Physical Exam    amb wm  nad slt gruff voice      02/25/2016        163   08/02/15 166 lb 3.2 oz (75.388 kg)  06/27/15 166 lb 8 oz (75.524 kg)  02/15/15 168 lb 6 oz (76.374 kg)    Vital signs reviewed        HEENT:  Edentulous/ nl  turbinates, and orophanx. Nl external ear canals without cough reflex   NECK :  without JVD/Nodes/TM/ nl carotid upstrokes bilaterally   LUNGS: no acc muscle use,  Slt distant bs bilaterally/ mild hyperresonance no wheeze    CV:  RRR  no s3 or murmur or increase in P2, no edema   ABD:  soft and nontender with nl excursion in the supine position. No bruits or organomegaly, bowel sounds nl  MS:  warm without deformities, calf tenderness, cyanosis or clubbing  SKIN: warm and dry without lesions    NEURO:  alert, approp, no deficits       I personally reviewed images and agree with radiology impression  as follows:  CT Chest  02/04/16 1. Advanced centrilobular emphysema. Scattered pulmonary parenchymal scarring. No evidence of interstitial lung disease or mycobacterium avium complex. 2. Interval clearing of a nodular density seen in the peripheral left upper lobe on the prior exam.    Assessment:

## 2016-02-25 NOTE — Patient Instructions (Addendum)
Please see patient coordinator before you leave today  to schedule overnight oxygen levels with 0xygen off  to see if it's safe to stop it permanently   Work on inhaler technique:  relax and gently blow all the way out then take a nice smooth deep breath back in, triggering the inhaler at same time you start breathing in.  Hold for up to 5 seconds if you can. Blow out thru nose. Rinse and gargle with water when done      If you are satisfied with your treatment plan,  let your doctor know and he/she can either refill your medications or you can return here when your prescription runs out.     If in any way you are not 100% satisfied,  please tell us.  If 100% better, tell your friends!  Pulmonary follow up is as needed

## 2016-02-25 NOTE — Assessment & Plan Note (Addendum)
Adequate control on present rx, reviewed > no change in rx needed    I had an extended final summary discussion with the patient reviewing all relevant studies completed to date and  lasting 15 to 20 minutes of a 25 minute visit on the following issues:     I reviewed the Fletcher curve with the patient that basically indicates  if you quit smoking when your best day FEV1 is still well preserved (as is clearly  Still  the case here)  it is highly unlikely you will progress to severe disease and informed the patient there was no medication on the market that has proven to alter the curve/ its downward trajectory  or the likelihood of progression of their disease.  Therefore stopping smoking and maintaining abstinence is the most important aspect of care, not choice of inhalers or for that matter, doctors.    Pulmonary f/u can be prn   Each maintenance medication was reviewed in detail including most importantly the difference between maintenance and as needed and under what circumstances the prns are to be used.  Please see instructions for details which were reviewed in writing and the patient given a copy.

## 2016-03-13 ENCOUNTER — Encounter (INDEPENDENT_AMBULATORY_CARE_PROVIDER_SITE_OTHER): Payer: Medicare Other | Admitting: Ophthalmology

## 2016-03-13 DIAGNOSIS — H353231 Exudative age-related macular degeneration, bilateral, with active choroidal neovascularization: Secondary | ICD-10-CM

## 2016-03-13 DIAGNOSIS — H43813 Vitreous degeneration, bilateral: Secondary | ICD-10-CM

## 2016-03-13 DIAGNOSIS — H33302 Unspecified retinal break, left eye: Secondary | ICD-10-CM

## 2016-03-13 DIAGNOSIS — I10 Essential (primary) hypertension: Secondary | ICD-10-CM | POA: Diagnosis not present

## 2016-03-13 DIAGNOSIS — H35033 Hypertensive retinopathy, bilateral: Secondary | ICD-10-CM | POA: Diagnosis not present

## 2016-03-14 ENCOUNTER — Other Ambulatory Visit: Payer: Self-pay

## 2016-03-14 MED ORDER — LOSARTAN POTASSIUM 100 MG PO TABS: 100.0000 mg | ORAL_TABLET | Freq: Every day | ORAL | Status: DC

## 2016-04-01 ENCOUNTER — Telehealth: Payer: Self-pay

## 2016-04-02 ENCOUNTER — Encounter: Payer: Self-pay | Admitting: Internal Medicine

## 2016-04-02 ENCOUNTER — Ambulatory Visit (INDEPENDENT_AMBULATORY_CARE_PROVIDER_SITE_OTHER): Payer: Medicare Other | Admitting: Internal Medicine

## 2016-04-02 VITALS — BP 128/68 | HR 60 | Temp 97.8°F | Ht 71.0 in | Wt 160.2 lb

## 2016-04-02 DIAGNOSIS — E119 Type 2 diabetes mellitus without complications: Secondary | ICD-10-CM | POA: Diagnosis not present

## 2016-04-02 DIAGNOSIS — N2889 Other specified disorders of kidney and ureter: Secondary | ICD-10-CM

## 2016-04-02 DIAGNOSIS — E785 Hyperlipidemia, unspecified: Secondary | ICD-10-CM | POA: Diagnosis not present

## 2016-04-02 DIAGNOSIS — I10 Essential (primary) hypertension: Secondary | ICD-10-CM | POA: Diagnosis not present

## 2016-04-02 DIAGNOSIS — Z0000 Encounter for general adult medical examination without abnormal findings: Secondary | ICD-10-CM | POA: Diagnosis not present

## 2016-04-02 DIAGNOSIS — Z09 Encounter for follow-up examination after completed treatment for conditions other than malignant neoplasm: Secondary | ICD-10-CM

## 2016-04-02 LAB — LIPID PANEL
CHOL/HDL RATIO: 3
Cholesterol: 125 mg/dL (ref 0–200)
HDL: 39.2 mg/dL (ref >39.00)
LDL Cholesterol: 49 mg/dL (ref 0–99)
NONHDL: 86.16
TRIGLYCERIDES: 186 mg/dL — AB (ref 0.0–149.0)
VLDL: 37.2 mg/dL (ref 0.0–40.0)

## 2016-04-02 LAB — CBC WITH DIFFERENTIAL/PLATELET
Basophils Absolute: 0.1 10*3/uL (ref 0.0–0.1)
Basophils Relative: 0.7 % (ref 0.0–3.0)
EOS PCT: 12.9 % — AB (ref 0.0–5.0)
Eosinophils Absolute: 1.1 10*3/uL — ABNORMAL HIGH (ref 0.0–0.7)
HCT: 38.1 % — ABNORMAL LOW (ref 39.0–52.0)
Hemoglobin: 13.1 g/dL (ref 13.0–17.0)
LYMPHS ABS: 2.1 10*3/uL (ref 0.7–4.0)
Lymphocytes Relative: 24.9 % (ref 12.0–46.0)
MCHC: 34.3 g/dL (ref 30.0–36.0)
MCV: 92.3 fl (ref 78.0–100.0)
MONO ABS: 0.6 10*3/uL (ref 0.1–1.0)
Monocytes Relative: 6.8 % (ref 3.0–12.0)
NEUTROS ABS: 4.7 10*3/uL (ref 1.4–7.7)
NEUTROS PCT: 54.7 % (ref 43.0–77.0)
PLATELETS: 248 10*3/uL (ref 150.0–400.0)
RBC: 4.13 Mil/uL — AB (ref 4.22–5.81)
RDW: 13.6 % (ref 11.5–15.5)
WBC: 8.6 10*3/uL (ref 4.0–10.5)

## 2016-04-02 LAB — BASIC METABOLIC PANEL
BUN: 23 mg/dL (ref 6–23)
CHLORIDE: 101 meq/L (ref 96–112)
CO2: 28 meq/L (ref 19–32)
CREATININE: 1.16 mg/dL (ref 0.40–1.50)
Calcium: 9.4 mg/dL (ref 8.4–10.5)
GFR: 64.11 mL/min (ref >60.00)
Glucose, Bld: 120 mg/dL — ABNORMAL HIGH (ref 70–99)
Potassium: 4.4 mEq/L (ref 3.5–5.1)
Sodium: 139 mEq/L (ref 135–145)

## 2016-04-02 LAB — AST: AST: 12 U/L (ref 0–37)

## 2016-04-02 LAB — ALT: ALT: 10 U/L (ref 0–53)

## 2016-04-02 LAB — HEMOGLOBIN A1C: HEMOGLOBIN A1C: 6.3 % (ref 4.6–6.5)

## 2016-04-02 NOTE — Progress Notes (Signed)
Pre visit review using our clinic review tool, if applicable. No additional management support is needed unless otherwise documented below in the visit note. 

## 2016-04-02 NOTE — Patient Instructions (Addendum)
Get your blood work before you leave   Be sure you have a healthcare power of attorney  Next visit in 6 months, no fasting     Fall Prevention and Home Safety Falls cause injuries and can affect all age groups. It is possible to use preventive measures to significantly decrease the likelihood of falls. There are many simple measures which can make your home safer and prevent falls. OUTDOORS  Repair cracks and edges of walkways and driveways.  Remove high doorway thresholds.  Trim shrubbery on the main path into your home.  Have good outside lighting.  Clear walkways of tools, rocks, debris, and clutter.  Check that handrails are not broken and are securely fastened. Both sides of steps should have handrails.  Have leaves, snow, and ice cleared regularly.  Use sand or salt on walkways during winter months.  In the garage, clean up grease or oil spills. BATHROOM  Install night lights.  Install grab bars by the toilet and in the tub and shower.  Use non-skid mats or decals in the tub or shower.  Place a plastic non-slip stool in the shower to sit on, if needed.  Keep floors dry and clean up all water on the floor immediately.  Remove soap buildup in the tub or shower on a regular basis.  Secure bath mats with non-slip, double-sided rug tape.  Remove throw rugs and tripping hazards from the floors. BEDROOMS  Install night lights.  Make sure a bedside light is easy to reach.  Do not use oversized bedding.  Keep a telephone by your bedside.  Have a firm chair with side arms to use for getting dressed.  Remove throw rugs and tripping hazards from the floor. KITCHEN  Keep handles on pots and pans turned toward the center of the stove. Use back burners when possible.  Clean up spills quickly and allow time for drying.  Avoid walking on wet floors.  Avoid hot utensils and knives.  Position shelves so they are not too high or low.  Place commonly used  objects within easy reach.  If necessary, use a sturdy step stool with a grab bar when reaching.  Keep electrical cables out of the way.  Do not use floor polish or wax that makes floors slippery. If you must use wax, use non-skid floor wax.  Remove throw rugs and tripping hazards from the floor. STAIRWAYS  Never leave objects on stairs.  Place handrails on both sides of stairways and use them. Fix any loose handrails. Make sure handrails on both sides of the stairways are as long as the stairs.  Check carpeting to make sure it is firmly attached along stairs. Make repairs to worn or loose carpet promptly.  Avoid placing throw rugs at the top or bottom of stairways, or properly secure the rug with carpet tape to prevent slippage. Get rid of throw rugs, if possible.  Have an electrician put in a light switch at the top and bottom of the stairs. OTHER FALL PREVENTION TIPS  Wear low-heel or rubber-soled shoes that are supportive and fit well. Wear closed toe shoes.  When using a stepladder, make sure it is fully opened and both spreaders are firmly locked. Do not climb a closed stepladder.  Add color or contrast paint or tape to grab bars and handrails in your home. Place contrasting color strips on first and last steps.  Learn and use mobility aids as needed. Install an electrical emergency response system.  Turn on  lights to avoid dark areas. Replace light bulbs that burn out immediately. Get light switches that glow.  Arrange furniture to create clear pathways. Keep furniture in the same place.  Firmly attach carpet with non-skid or double-sided tape.  Eliminate uneven floor surfaces.  Select a carpet pattern that does not visually hide the edge of steps.  Be aware of all pets. OTHER HOME SAFETY TIPS  Set the water temperature for 120 F (48.8 C).  Keep emergency numbers on or near the telephone.  Keep smoke detectors on every level of the home and near sleeping  areas. Document Released: 10/31/2002 Document Revised: 05/11/2012 Document Reviewed: 01/30/2012 [facility] Patient Information 2015 [place], [place]. This information is not intended to replace advice given to you by your health care provider. Make sure you discuss any questions you have with your health care provider.   Preventive Care for Adults Ages 70 and over  Blood pressure check.** / Every 1 to 2 years.  Lipid and cholesterol check.**/ Every 5 years beginning at age 8.  Lung cancer screening. / Every year if you are aged 69-80 years and have a 30-pack-year history of smoking and currently smoke or have quit within the past 15 years. Yearly screening is stopped once you have quit smoking for at least 15 years or develop a health problem that would prevent you from having lung cancer treatment.  Fecal occult blood test (FOBT) of stool. / Every year beginning at age 58 and continuing until age 31. You may not have to do this test if you get a colonoscopy every 10 years.  Flexible sigmoidoscopy** or colonoscopy.** / Every 5 years for a flexible sigmoidoscopy or every 10 years for a colonoscopy beginning at age 14 and continuing until age 31.  Hepatitis C blood test.** / For all people born from [birth year] through 1963 and any individual with known risks for hepatitis C.  Abdominal aortic aneurysm (AAA) screening.** / A one-time screening for ages 82 to 76 years who are current or former smokers.  Skin self-exam. / Monthly.  Influenza vaccine. / Every year.  Tetanus, diphtheria, and acellular pertussis (Tdap/Td) vaccine.** / 1 dose of Td every 10 years.  Varicella vaccine.** / Consult your health care provider.  Zoster vaccine.** / 1 dose for adults aged 40 years or older.  Pneumococcal 13-valent conjugate (PCV13) vaccine.** / Consult your health care provider.  Pneumococcal polysaccharide (PPSV23) vaccine.** / 1 dose for all adults aged 41 years and older.  Meningococcal vaccine.** /  Consult your health care provider.  Hepatitis A vaccine.** / Consult your health care provider.  Hepatitis B vaccine.** / Consult your health care provider.  Haemophilus influenzae type b (Hib) vaccine.** / Consult your health care provider. **Family history and personal history of risk and conditions may change your health care provider's recommendations. Document Released: 01/06/2002 Document Revised: 11/15/2013 Document Reviewed: 04/07/2011 [facility] Patient Information 2015 [place], [place]. This information is not intended to replace advice given to you by your health care provider. Make sure you discuss any questions you have with your health care provider.

## 2016-04-02 NOTE — Progress Notes (Addendum)
Subjective:    Patient ID: Jim [name], male    DOB: [birth year], 82 y.o.   MRN: [id number]  DOS:  04/02/2016 Type of visit - description :   Here for Medicare AWV: 1. Risk factors based on Past M, S, F history: reviewed 2. Physical Activities: active at home, + light  yard work, has a garden     3. Depression/mood:  Neg screening 4. Hearing: No problems noted or reported   5. ADL's:  Independent,  drives 6. Fall Risk: no recent falls, prevention discussed   7. home Safety: does feel safe at home   8. Height, weight, &visual acuity: see VS, sees the eye doctor (dx mac deg,  s/p cataracts) 9. Counseling: provided 10. Labs ordered based on risk factors: if needed   11. Referral Coordination: if needed 12. Care Plan, see assessment and plan   13. Cognitive Assessment: Cognition and motor skills seem appropriate for age   14. Care team updated 15. End of life care discussed  In addition, today we discussed the following: COPD: Saw pulmonology last month, felt to be stable, no changes. States he did an overnight oximetry, results? Saw urology 11-2015, note reviewed, was Rx a CT to follow-up renal mass Diabetes: On metformin, no ambulatory CBGs High cholesterol: Good compliance with Lipitor without side effects HTN: On meds, ambulatory BPs within normal   Review of Systems  Constitutional: No fever. No chills. No unexplained wt changes. No unusual sweats  HEENT: No dental problems, no ear discharge, no facial swelling, no voice changes. No eye discharge, no eye  redness , no  intolerance to light   Respiratory:  Respiratory symptoms at baseline, denies any frequent cough, able to walk more than 1 or 2 blocks without any problems. No hemoptysis.   Cardiovascular: No CP, no leg swelling , no  Palpitations  GI: no nausea, no vomiting, no diarrhea , no  abdominal pain.  No blood in the stools. No dysphagia, no odynophagia    Endocrine: No polyphagia, no polyuria , no  polydipsia  GU: No dysuria, gross hematuria, difficulty urinating. No urinary urgency, no frequency.  Musculoskeletal: No joint swellings or unusual aches or pains  Skin: No change in the color of the skin, palor , no  Rash  Allergic, immunologic: No environmental allergies , no  food allergies  Neurological: No dizziness no  syncope. No headaches. No diplopia, no slurred, no slurred speech, no motor deficits, no facial  Numbness  Hematological: No enlarged lymph nodes, no easy bruising , no unusual bleedings  Psychiatry: No suicidal ideas, no hallucinations, no beavior problems, no confusion.  No unusual/severe anxiety, no depression  Past Medical History  Diagnosis Date  . Type 2 diabetes mellitus ([place]) 10/2009  . COPD (chronic obstructive pulmonary disease) ([place])   . Syncope 2010    Evaluated by Dr. Caryl Comes - normal LVEF, possibly neurally mediated  . Hyperlipidemia   . Essential hypertension, benign   . Prostate cancer ([place]) 2008    s/p XRT  . ED (erectile dysfunction)   . Cholestatic hepatitis 2014  . Personal history of colonic polyps   . Renal mass 08/12/2014    Past Surgical History  Procedure Laterality Date  . Cataract extraction Bilateral   . Eye surgery Left 07/17/2014  . Colonoscopy    . Ercp N/A 08/16/2014    Procedure: ENDOSCOPIC RETROGRADE CHOLANGIOPANCREATOGRAPHY (ERCP);  Surgeon: Milus Banister, MD;  Location: [facility];  Service: Endoscopy;  Laterality: N/A;  .  Cholecystectomy N/A 08/22/2014    Procedure: LAPAROSCOPIC CHOLECYSTECTOMY WITH INTRAOPERATIVE CHOLANGIOGRAM;  Surgeon: Gayland Curry, MD;  Location: [facility];  Service: General;  Laterality: N/A;    Social History   Social History  . Marital Status: Divorced    Spouse Name: N/A  . Number of Children: 2  . Years of Education: N/A   Occupational History  . Retired     Social History Main Topics  . Smoking status: Former Smoker -- 2.00 packs/day for 40 years    Types: Cigarettes    Quit date:  11/24/2005  . Smokeless tobacco: Never Used  . Alcohol Use: 0.0 oz/week     Comment: Rarely  . Drug Use: No  . Sexual Activity: Not on file   Other Topics Concern  . Not on file   Social History Narrative   Independent on ADL    Lives with son and daughter in law   Has Gk and GGk     Family History  Problem Relation Age of Onset  . Cirrhosis Brother   . Dementia Mother   . Colon cancer Neg Hx   . Prostate cancer Neg Hx   . CAD Neg Hx        Medication List       This list is accurate as of: 04/02/16  6:07 PM.  Always use your most recent med list.               albuterol (2.5 MG/3ML) 0.083% nebulizer solution  Commonly known as:  PROVENTIL  Take 3 mLs (2.5 mg total) by nebulization 4 (four) times daily as needed.     aspirin 81 MG tablet  Take 81 mg by mouth daily.     atorvastatin 20 MG tablet  Commonly known as:  LIPITOR  Take 1 tablet (20 mg total) by mouth daily.     BESIVANCE 0.6 % Susp  Generic drug:  Besifloxacin HCl  As needed  Both eyes     carvedilol 25 MG tablet  Commonly known as:  COREG  Take 1 tablet (25 mg total) by mouth 2 (two) times daily with a meal.     DULERA 200-5 MCG/ACT Aero  Generic drug:  mometasone-formoterol  take 2 puffs twice a day     furosemide 40 MG tablet  Commonly known as:  LASIX  Take 1 tablet (40 mg total) by mouth 2 (two) times daily.     Ipratropium-Albuterol 20-100 MCG/ACT Aers respimat  Commonly known as:  COMBIVENT RESPIMAT  Inhale 1 puff into the lungs every 6 (six) hours as needed for wheezing.     losartan 100 MG tablet  Commonly known as:  COZAAR  Take 1 tablet (100 mg total) by mouth daily.     metFORMIN 500 MG tablet  Commonly known as:  GLUCOPHAGE  Take 2 tablets by mouth every morning with breakfast and 1 tablet every evening with dinner.     PRESERVISION AREDS PO  Take 2 tablets by mouth daily.           Objective:   Physical Exam BP 128/68 mmHg  Pulse 60  Temp(Src) 97.8 F (36.6  C) (Oral)  Ht 5\' 11"  (1.803 m)  Wt 160 lb 4 oz (72.689 kg)  BMI 22.36 kg/m2  SpO2 96%  General:   Well developed, well nourished . NAD.  Neck: No  thyromegaly   HEENT:  Normocephalic . Face symmetric, atraumatic Lungs:  Decreased breath sounds but clear Normal respiratory effort,  no intercostal retractions, no accessory muscle use. Heart: RRR,  no murmur.  No pretibial edema bilaterally  Abdomen:  Not distended, soft, non-tender. No rebound or rigidity.   Skin: Exposed areas without rash. Not pale. Not jaundice Neurologic:  alert & oriented X3.  Speech normal, gait appropriate for age and unassisted Strength symmetric and appropriate for age.  Psych: Cognition and judgment appear intact.  Cooperative with normal attention span and concentration.  Behavior appropriate. No anxious or depressed appearing.    Assessment & Plan:  Assessment > DM HTN Hyperlipidemia COPD, CT chest HR >>> 01/2016 Renal mass 9- 2015, dr Karsten Ro, next CT rx per urology for 11-2015  H/o Prostate cancer 2008 XRT Syncope 2010 saw Dr. Caryl Comes H/o Cholestatic hepatitis- s/p ERCP 2014,  cholecystectomy 2015  PLAN: DM: Continue metformin, check A1c. HTN: Continue losartan, carvedilol, Lasix. Check BMP and CBC.  COPD: Follow-up by pulmonary high cholesterol: Continue Lipitor, check a FLP Renal  mass: Saw urology, reportedly a CT was done 11-2015. Will try to get reports. Addendum: Urology note 12-25-15 reviewed, XRs: stable renal CT, they rec re-do a renal  CT 6 months later.  RTC 6 months

## 2016-04-02 NOTE — Assessment & Plan Note (Addendum)
DM: Continue metformin, check A1c. HTN: Continue losartan, carvedilol, Lasix. Check BMP and CBC.  COPD: Follow-up by pulmonary high cholesterol: Continue Lipitor, check a FLP  Renal  mass: Saw urology, reportedly a CT was done 11-2015. Will try to get reports. Addendum: Urology note 12-25-15 reviewed, XRs: stable renal CT, they rec re-do a renal  CT 6 months later.  RTC 6 months

## 2016-04-02 NOTE — Assessment & Plan Note (Signed)
Td 09 ; pneumonia shot  2010 ; prevnar-- 2010 Got a zostavax at the pharmacy ~ 2012 [facility] -- no further cscopes, see previous entries   PSA per urology  Diet-exercise discussed

## 2016-04-04 NOTE — Addendum Note (Signed)
Addended byDamita Dunnings D on: 04/04/2016 11:51 AM   Modules accepted: Medications

## 2016-04-10 ENCOUNTER — Encounter (INDEPENDENT_AMBULATORY_CARE_PROVIDER_SITE_OTHER): Payer: Medicare Other | Admitting: Ophthalmology

## 2016-04-10 DIAGNOSIS — H353231 Exudative age-related macular degeneration, bilateral, with active choroidal neovascularization: Secondary | ICD-10-CM

## 2016-04-10 DIAGNOSIS — H35033 Hypertensive retinopathy, bilateral: Secondary | ICD-10-CM | POA: Diagnosis not present

## 2016-04-10 DIAGNOSIS — I10 Essential (primary) hypertension: Secondary | ICD-10-CM | POA: Diagnosis not present

## 2016-04-10 DIAGNOSIS — H33302 Unspecified retinal break, left eye: Secondary | ICD-10-CM | POA: Diagnosis not present

## 2016-04-10 DIAGNOSIS — H43813 Vitreous degeneration, bilateral: Secondary | ICD-10-CM | POA: Diagnosis not present

## 2016-04-14 ENCOUNTER — Other Ambulatory Visit: Payer: Self-pay

## 2016-04-14 MED ORDER — CARVEDILOL 25 MG PO TABS: 25.0000 mg | ORAL_TABLET | Freq: Two times a day (BID) | ORAL | Status: DC

## 2016-04-25 NOTE — Telephone Encounter (Signed)
Pre visit call.

## 2016-04-29 ENCOUNTER — Other Ambulatory Visit: Payer: Self-pay

## 2016-04-29 MED ORDER — IPRATROPIUM-ALBUTEROL 20-100 MCG/ACT IN AERS: 1.0000 | INHALATION_SPRAY | Freq: Four times a day (QID) | RESPIRATORY_TRACT | Status: DC | PRN

## 2016-05-08 ENCOUNTER — Encounter (INDEPENDENT_AMBULATORY_CARE_PROVIDER_SITE_OTHER): Payer: Medicare Other | Admitting: Ophthalmology

## 2016-05-08 DIAGNOSIS — I10 Essential (primary) hypertension: Secondary | ICD-10-CM | POA: Diagnosis not present

## 2016-05-08 DIAGNOSIS — H43813 Vitreous degeneration, bilateral: Secondary | ICD-10-CM

## 2016-05-08 DIAGNOSIS — H33302 Unspecified retinal break, left eye: Secondary | ICD-10-CM

## 2016-05-08 DIAGNOSIS — H353231 Exudative age-related macular degeneration, bilateral, with active choroidal neovascularization: Secondary | ICD-10-CM | POA: Diagnosis not present

## 2016-05-08 DIAGNOSIS — H35033 Hypertensive retinopathy, bilateral: Secondary | ICD-10-CM

## 2016-05-09 NOTE — Addendum Note (Signed)
Addended by: [name] E on: 05/09/2016 10:29 AM   Modules accepted: Miquel Dunn

## 2016-05-28 ENCOUNTER — Other Ambulatory Visit: Payer: Self-pay

## 2016-05-28 MED ORDER — LOSARTAN POTASSIUM 100 MG PO TABS: 100.0000 mg | ORAL_TABLET | Freq: Every day | ORAL | Status: DC

## 2016-05-28 MED ORDER — FUROSEMIDE 40 MG PO TABS: 40.0000 mg | ORAL_TABLET | Freq: Two times a day (BID) | ORAL | Status: DC

## 2016-06-12 ENCOUNTER — Encounter (INDEPENDENT_AMBULATORY_CARE_PROVIDER_SITE_OTHER): Payer: Medicare Other | Admitting: Ophthalmology

## 2016-06-12 DIAGNOSIS — I10 Essential (primary) hypertension: Secondary | ICD-10-CM | POA: Diagnosis not present

## 2016-06-12 DIAGNOSIS — H43813 Vitreous degeneration, bilateral: Secondary | ICD-10-CM | POA: Diagnosis not present

## 2016-06-12 DIAGNOSIS — H35033 Hypertensive retinopathy, bilateral: Secondary | ICD-10-CM | POA: Diagnosis not present

## 2016-06-12 DIAGNOSIS — H33302 Unspecified retinal break, left eye: Secondary | ICD-10-CM

## 2016-06-12 DIAGNOSIS — H353231 Exudative age-related macular degeneration, bilateral, with active choroidal neovascularization: Secondary | ICD-10-CM | POA: Diagnosis not present

## 2016-06-17 DIAGNOSIS — Z8546 Personal history of malignant neoplasm of prostate: Secondary | ICD-10-CM | POA: Diagnosis not present

## 2016-06-17 DIAGNOSIS — N281 Cyst of kidney, acquired: Secondary | ICD-10-CM | POA: Diagnosis not present

## 2016-06-17 DIAGNOSIS — N2889 Other specified disorders of kidney and ureter: Secondary | ICD-10-CM | POA: Diagnosis not present

## 2016-06-24 DIAGNOSIS — Z8546 Personal history of malignant neoplasm of prostate: Secondary | ICD-10-CM | POA: Diagnosis not present

## 2016-06-24 DIAGNOSIS — D49519 Neoplasm of unspecified behavior of unspecified kidney: Secondary | ICD-10-CM | POA: Diagnosis not present

## 2016-07-17 ENCOUNTER — Encounter (INDEPENDENT_AMBULATORY_CARE_PROVIDER_SITE_OTHER): Payer: Medicare Other | Admitting: Ophthalmology

## 2016-07-17 DIAGNOSIS — H33302 Unspecified retinal break, left eye: Secondary | ICD-10-CM | POA: Diagnosis not present

## 2016-07-17 DIAGNOSIS — H353231 Exudative age-related macular degeneration, bilateral, with active choroidal neovascularization: Secondary | ICD-10-CM

## 2016-07-17 DIAGNOSIS — H35033 Hypertensive retinopathy, bilateral: Secondary | ICD-10-CM

## 2016-07-17 DIAGNOSIS — I10 Essential (primary) hypertension: Secondary | ICD-10-CM | POA: Diagnosis not present

## 2016-07-17 DIAGNOSIS — H43813 Vitreous degeneration, bilateral: Secondary | ICD-10-CM

## 2016-07-31 ENCOUNTER — Other Ambulatory Visit: Payer: Self-pay | Admitting: Internal Medicine

## 2016-08-02 IMAGING — CR DG CHEST 2V
2 series · 2 of 2 positions shown · non-contrast
Comparison: 08/12/2014.

CLINICAL DATA: Shortness of breath.

EXAM:
CHEST  2 VIEW

[w chest pa]
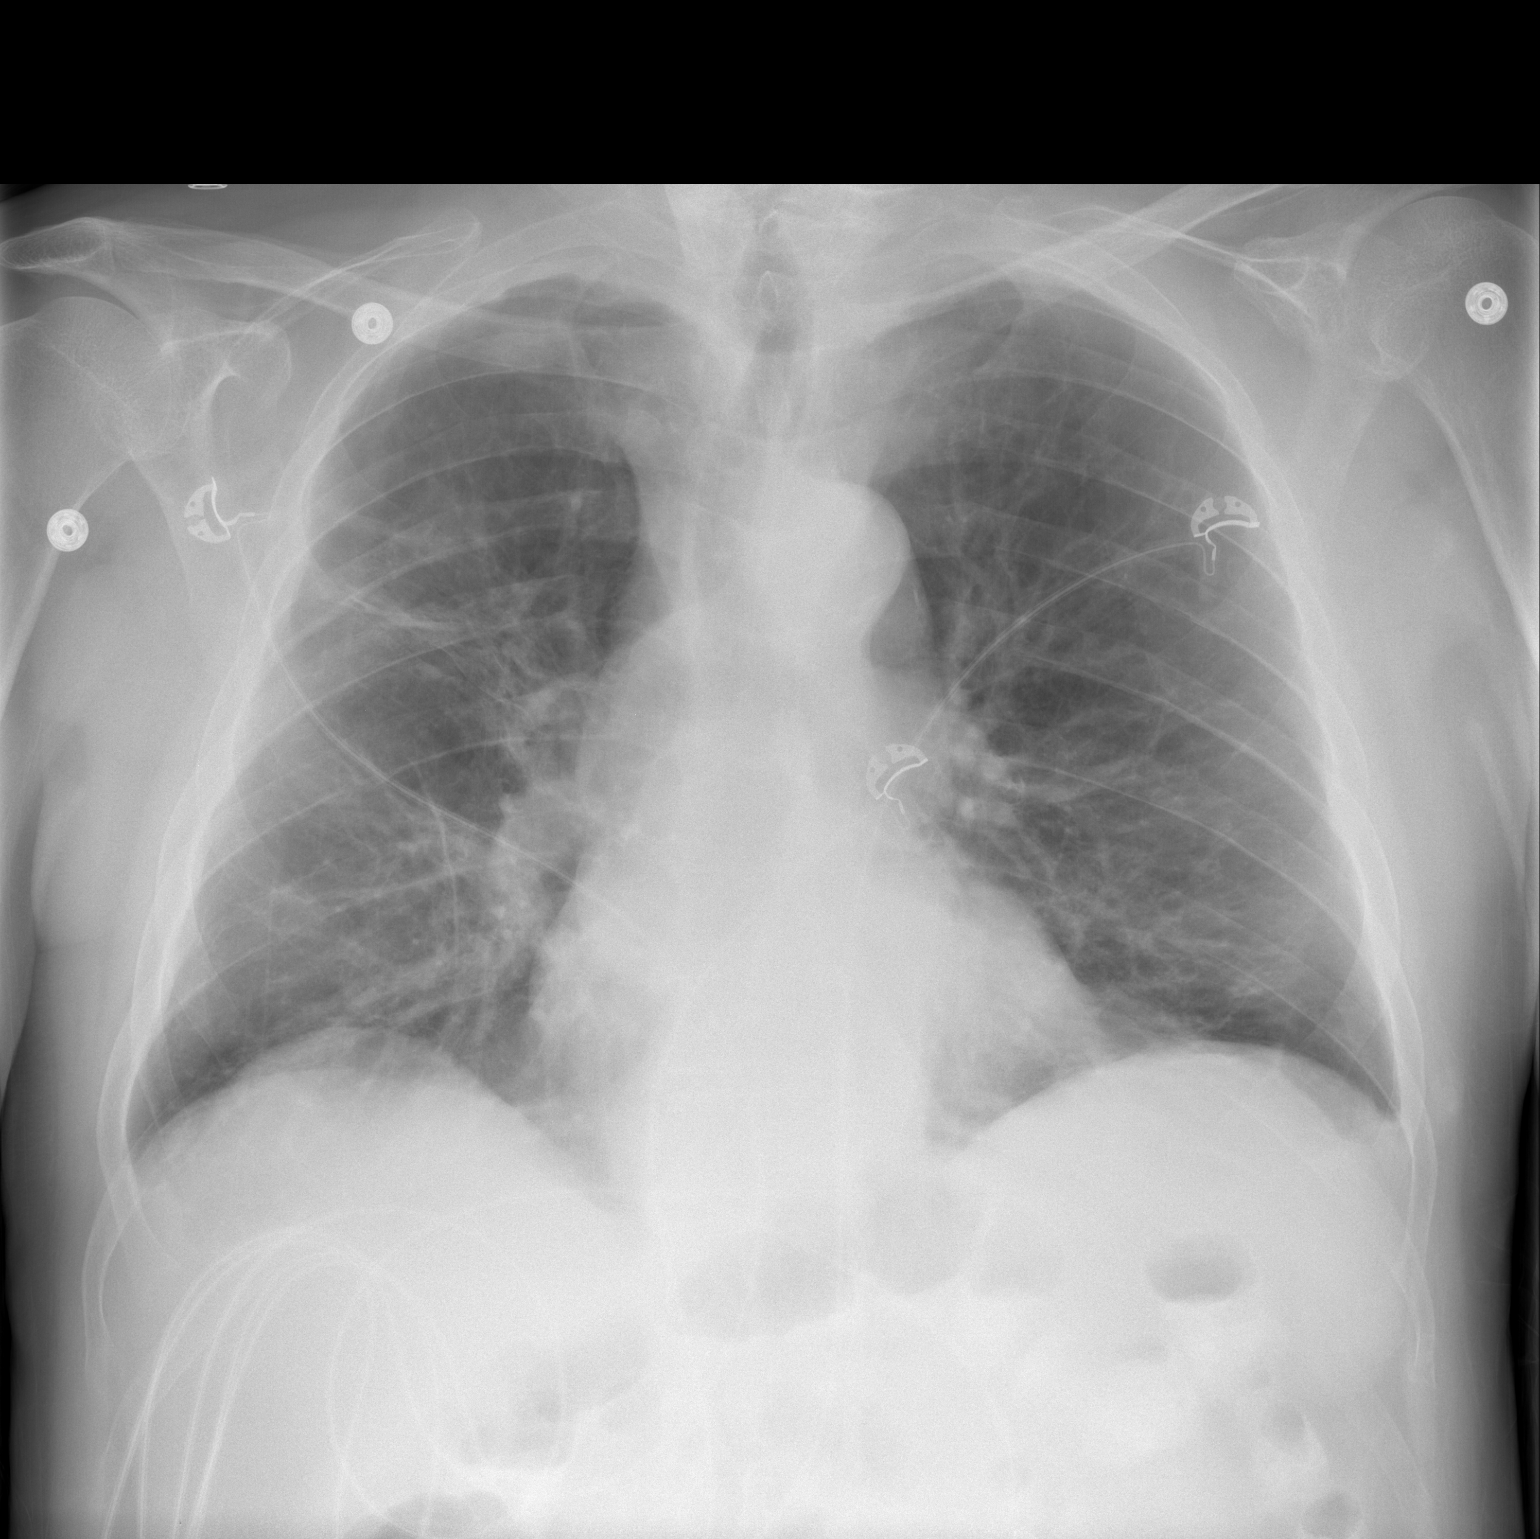

[w chest lat]
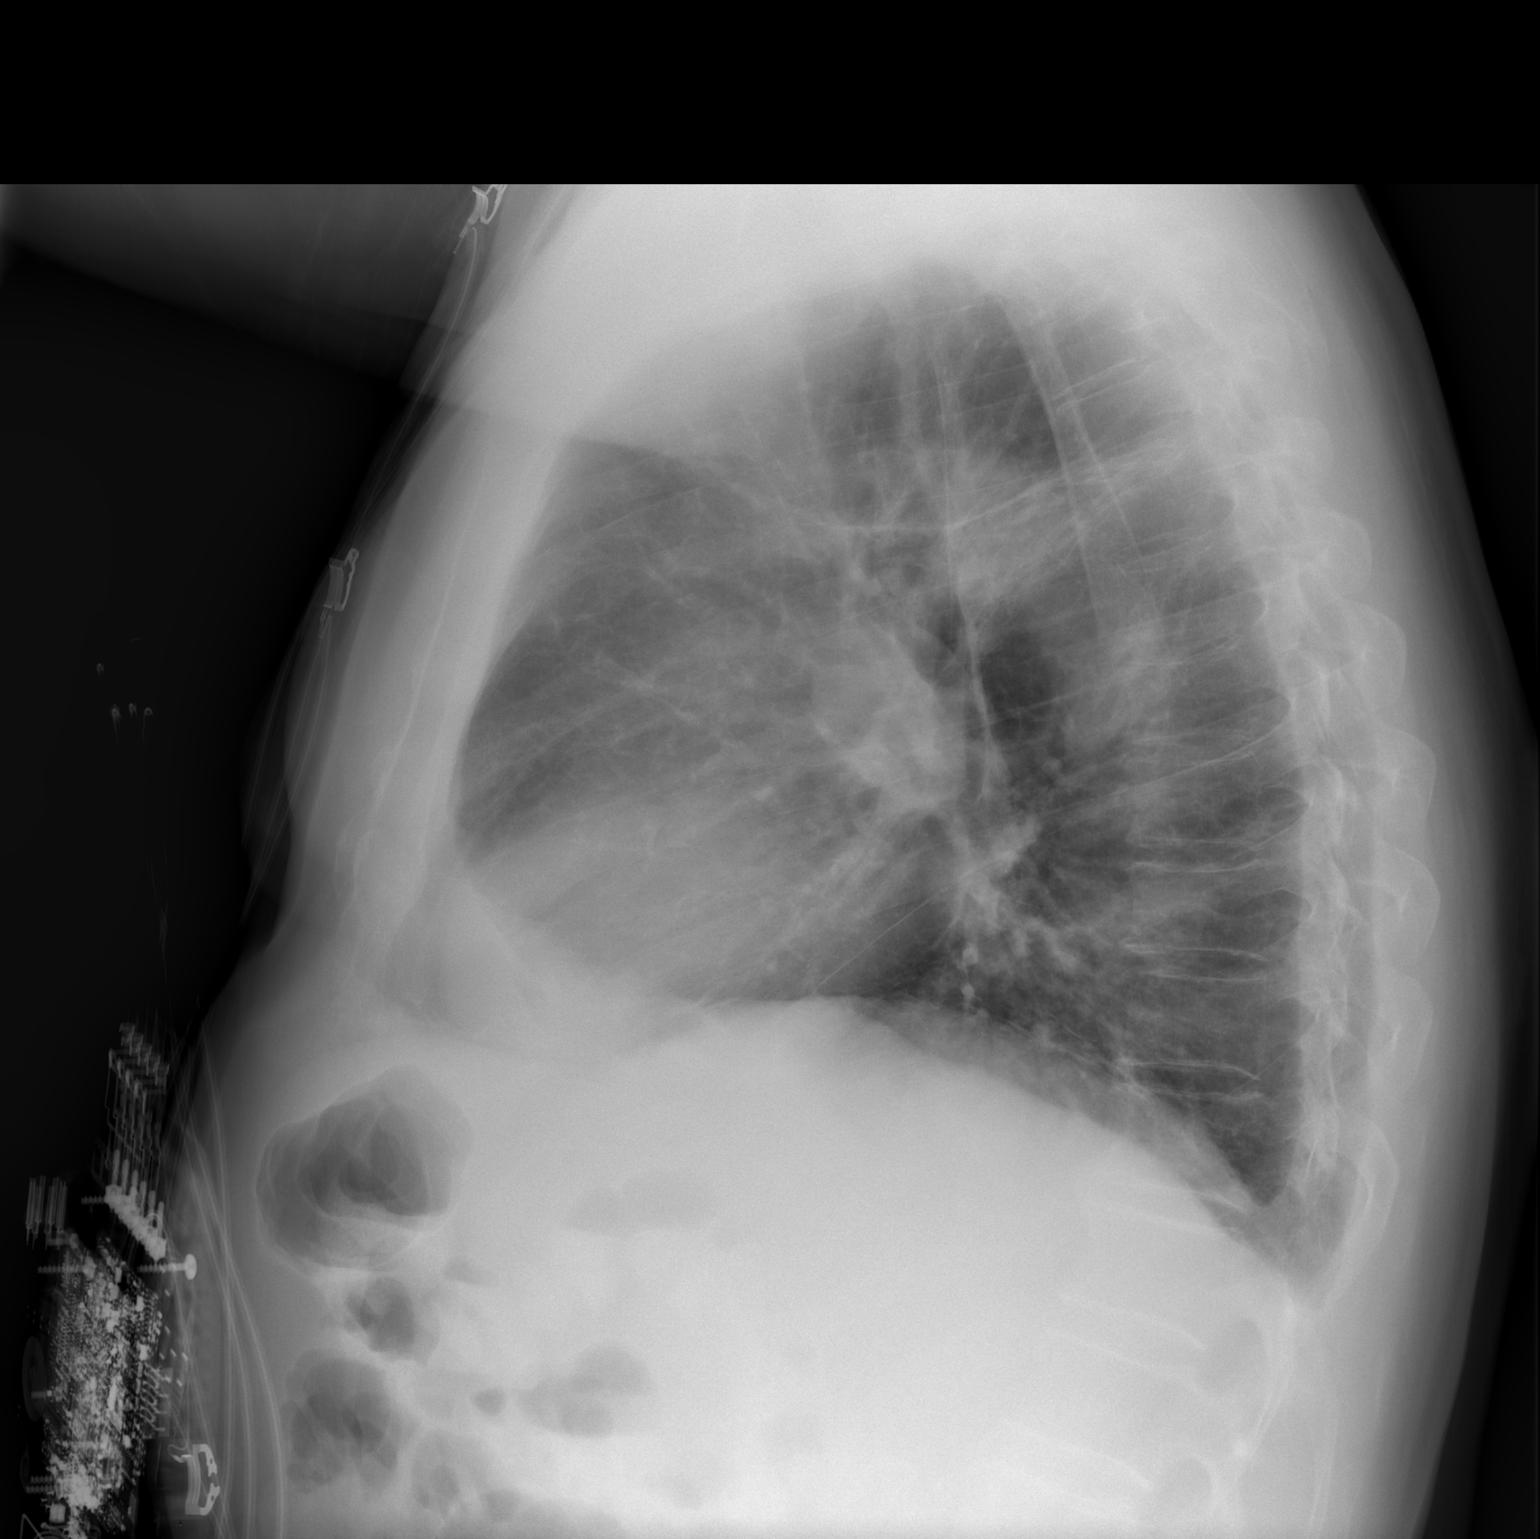

[2 of 2 positions shown; findings below may reference images not displayed]

FINDINGS: Normal sized heart. No significant change in bilateral linear
densities. Moderate sized hiatal hernia. This contained fat only on
the CT dated 06/27/2010. Mild thoracic spine degenerative changes.
IMPRESSION: 1. No acute abnormality.
2. Stable bilateral scarring.
3. Moderate-sized hiatal hernia, previously shown to contain fat
only.

## 2016-08-03 IMAGING — MR MR 3D RECON AT SCANNER
19 of 20 series · 19 of 20 positions shown · IV contrast (multihance)
Comparison: CT of 08/12/14.

CLINICAL DATA: Dilated common bile duct on CT. Renal mass. Elevated
liver function tests.

EXAM:
MRI ABDOMEN WITHOUT AND WITH CONTRAST (INCLUDING MRCP)
TECHNIQUE: Multiplanar multisequence MR imaging of the abdomen was performed
both before and after the administration of intravenous contrast.
Heavily T2-weighted images of the biliary and pancreatic ducts were
obtained, and three-dimensional MRCP images were rendered by post
processing.
CONTRAST:  16mL MULTIHANCE GADOBENATE DIMEGLUMINE 529 MG/ML IV SOLN

[Series 3: T2 fat-sat · axial · 5.0mm · 0.78mm/px · 1 of 58 slices shown]
[im 1/58]
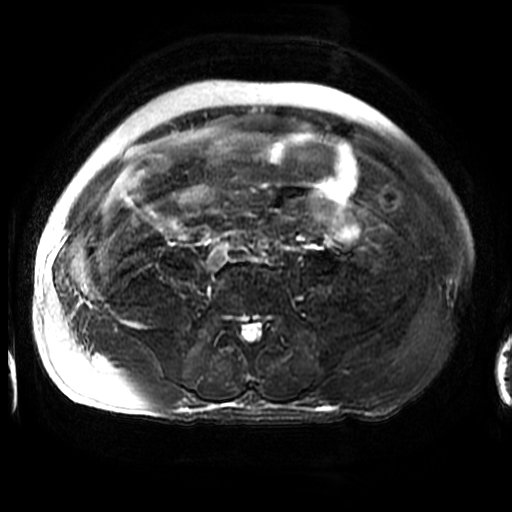

[Series 4: MRCP · coronal · 1.6mm · 0.62mm/px · 1 of 117 slices shown (1 of 3)]
[im 1/117]
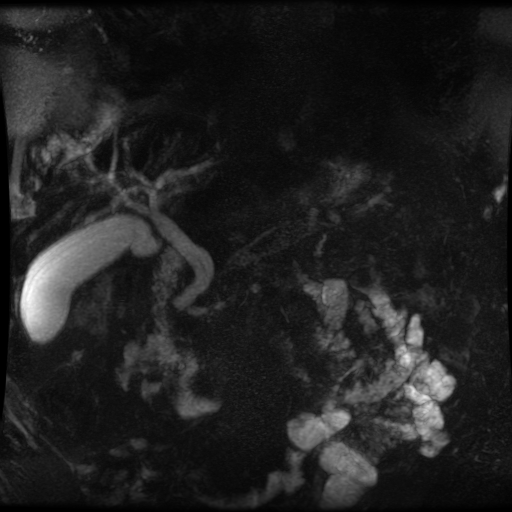

[Series 5: MRCP · coronal · 2.0mm · 0.70mm/px · 1 of 57 slices shown (2 of 3)]
[im 1/57]
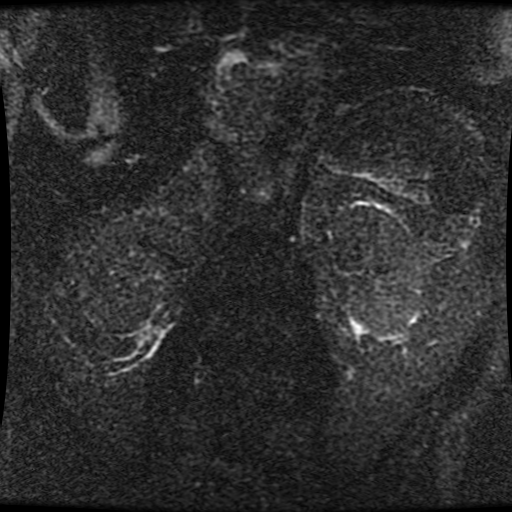

[Series 6: DWI b500 · axial · 6.0mm · 1.48mm/px · 1 of 79 slices shown]
[im 1/79]
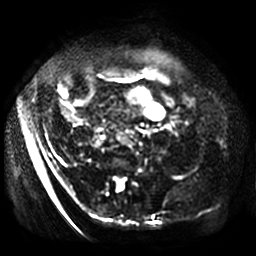

[Series 9: ax dualecho · axial · 5.0mm · 0.78mm/px · 1 of 118 slices shown]
[im 1/118]
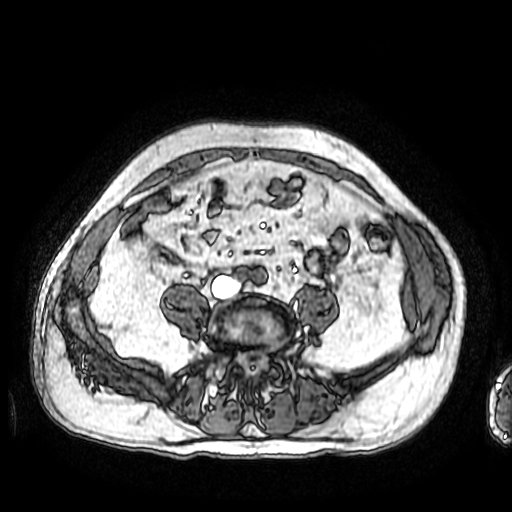

[Series 10: MRCP · coronal · 40.0mm · 0.70mm/px · 1 of 6 slices shown (3 of 3)]
[im 1/6]
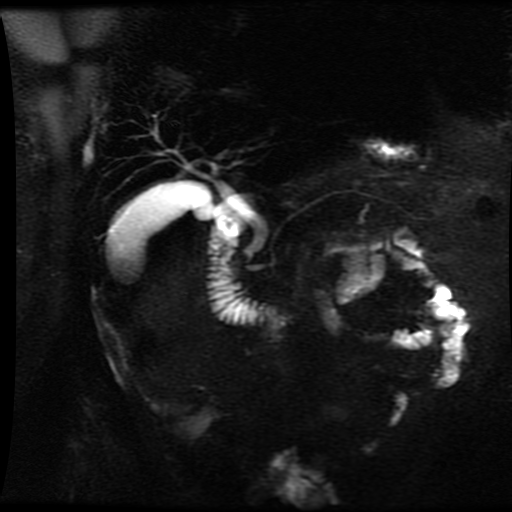

[Series 11: bSSFP fat-sat · coronal · 5.0mm · 0.70mm/px · 1 of 53 slices shown]
[im 1/53]
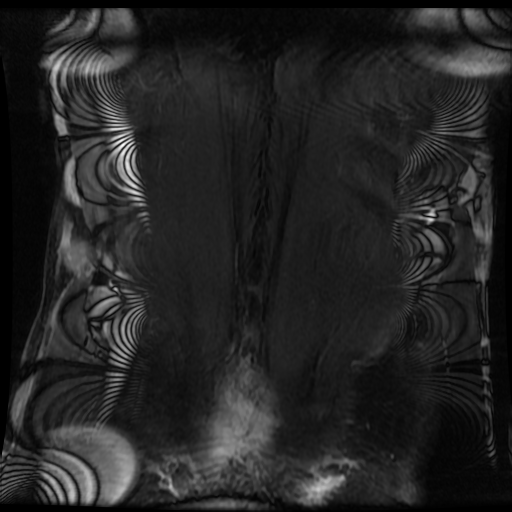

[Series 12: T2 · axial · 5.0mm · 0.78mm/px · 1 of 59 slices shown]
[im 1/59]
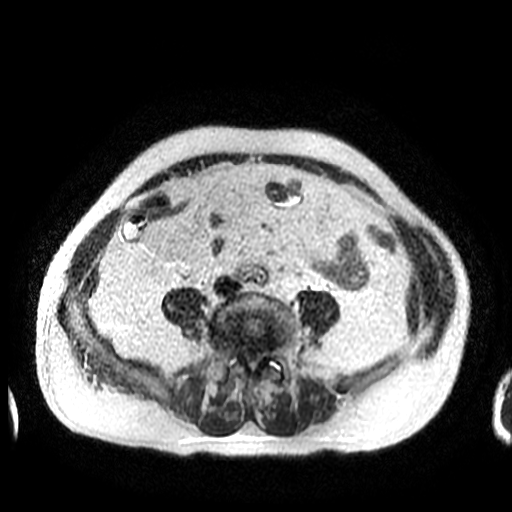

[Series 14: T1 dynamic · coronal · delayed · 6.0mm · 0.78mm/px · 1 of 88 slices shown (1 of 5)]
[im 1/88]
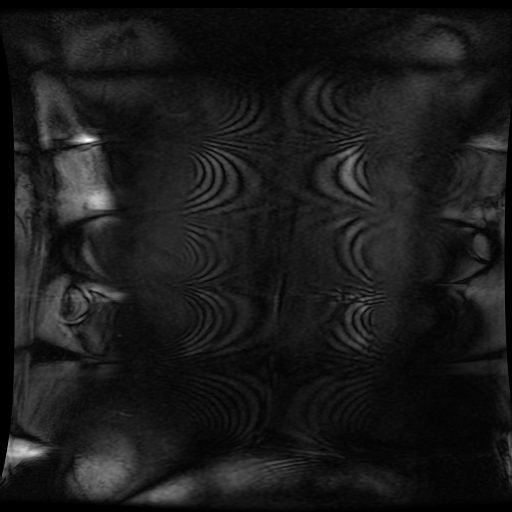

[Series 100: ((id)/(date)..80)-((id)/(id)/1..80) · axial · 7.0mm · 0.78mm/px · 1 of 80 slices shown]
[im 1/80]
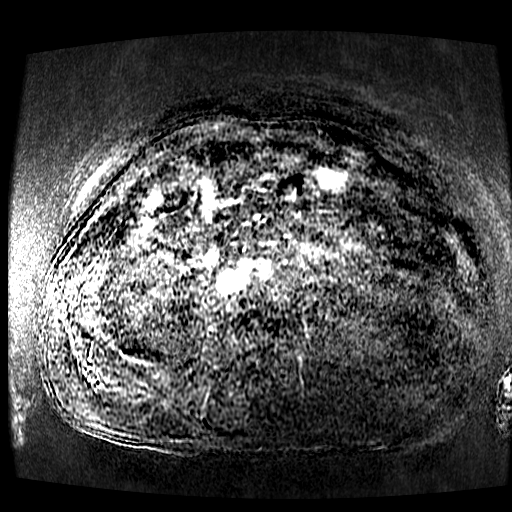

[Series 400: reformatted · axial · 1.6mm · 0.62mm/px · 1 of 137 slices shown (1 of 2)]
[im 1/137]
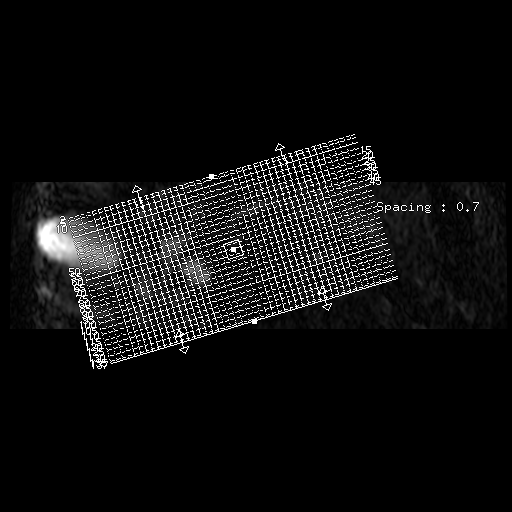

[Series 401: reformatted · coronal · 100.0mm · 0.51mm/px · 1 of 180 slices shown (2 of 2)]
[im 1/180]
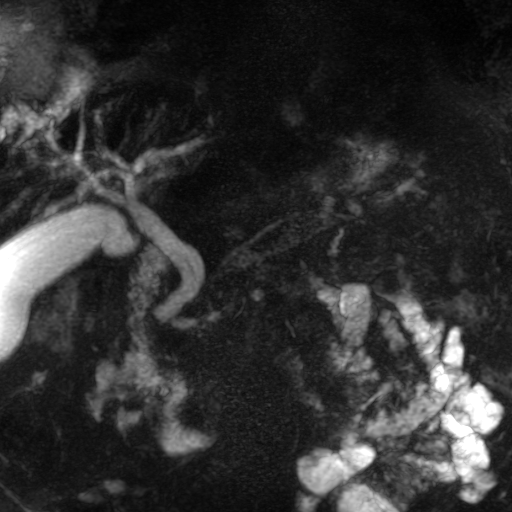

[Series 1300: T1 dynamic · axial · 7.0mm · 0.78mm/px · 1 of 80 slices shown (2 of 5)]
[im 1/80]
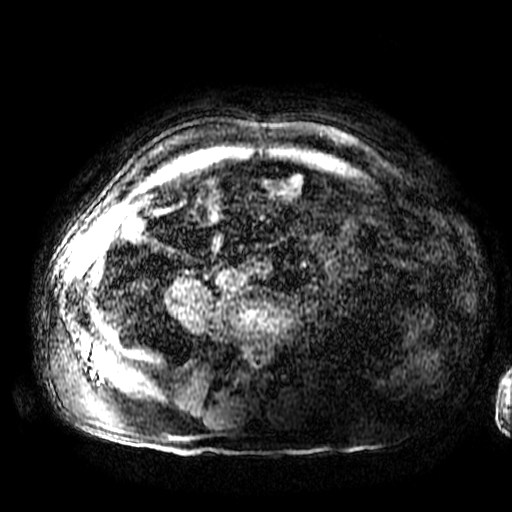

[Series 1301: T1 dynamic · axial · 7.0mm · 0.78mm/px · 1 of 80 slices shown (3 of 5)]
[im 1/80]
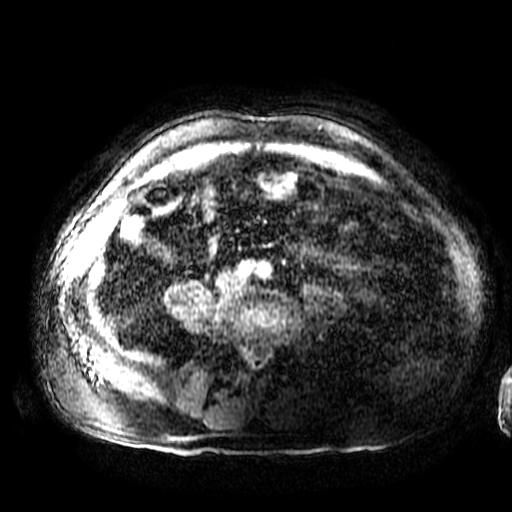

[Series 1302: T1 dynamic · axial · 7.0mm · 0.78mm/px · 1 of 80 slices shown (4 of 5)]
[im 1/80]
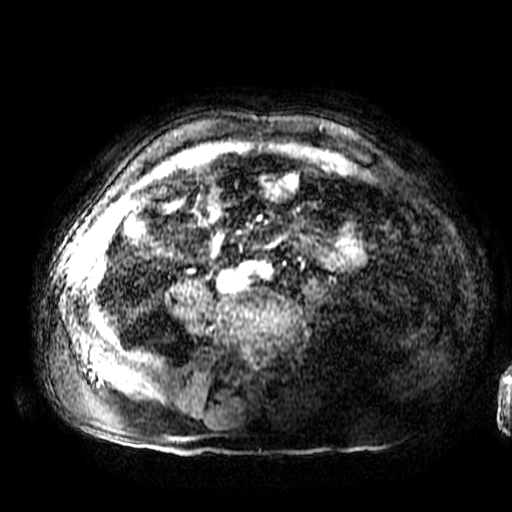

[Series 1303: T1 dynamic · axial · 7.0mm · 0.78mm/px · 1 of 80 slices shown (5 of 5)]
[im 1/80]
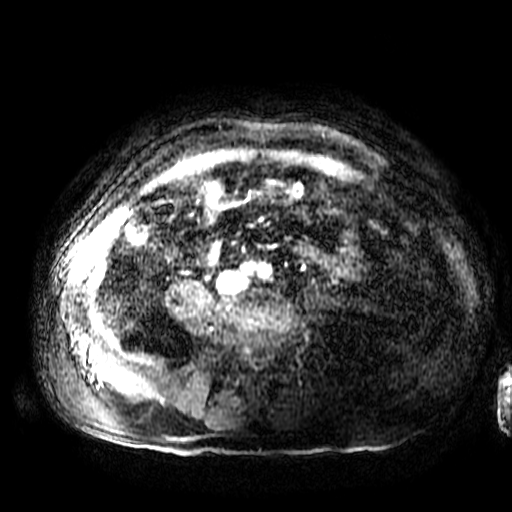

[((id)/(id)/1)-((id)/(id)/1) · axial · 7.0mm · 0.78mm/px · 1 of 80 slices shown (1 of 3)]
[im 1/80]
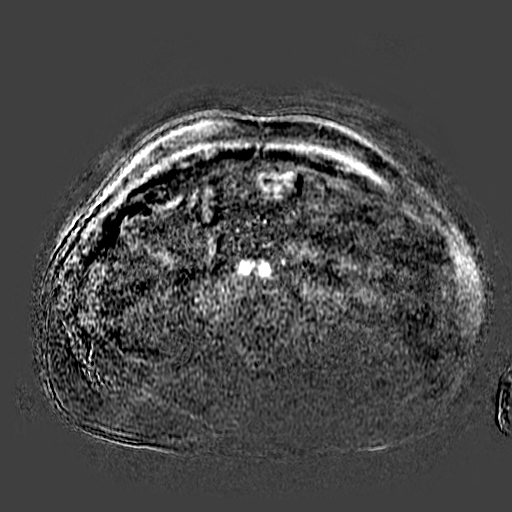

[((id)/(id)/1)-((id)/(id)/1) · axial · 7.0mm · 0.78mm/px · 1 of 80 slices shown (2 of 3)]
[im 1/80]
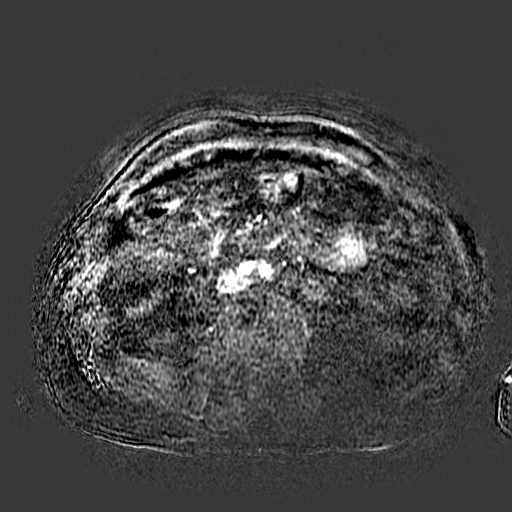

[((id)/(id)/1)-((id)/(id)/1) · axial · 7.0mm · 0.78mm/px · 1 of 80 slices shown (3 of 3)]
[im 1/80]
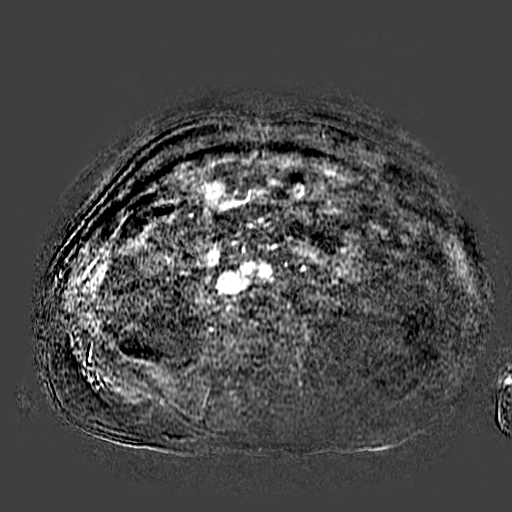

[19 of 20 positions shown; findings below may reference images not displayed]

FINDINGS: Lower chest: Normal heart size.  Trace bilateral pleural effusions.

Motion degradation throughout the abdomen.

Tiny hiatal hernia.

Hepatobiliary: No focal liver lesion. Minimal intrahepatic biliary
ductal dilatation. Normal gallbladder. Common duct mildly dilated,
up to 12 mm on image 46 of series 4. Distal common duct filling
defect at the level of the ampulla. 9 mm on image 37 of series 4.
Image 88 of series 400 and image 27 of series 11. This appears more
distally positioned than on the 08/12/2014 CT.

Spleen: Normal

Pancreas: Moderate peripancreatic edema, increased since 08/12/14.
Example image 26 of series 3. No pancreatic ductal dilatation or
mass. No peripancreatic fluid collection. Diffuse pancreatic
enhancement, without evidence of necrosis.

Stomach/Bowel: Normal distal stomach and imaged abdominal bowel
loops.

Adrenals/Urinary tract: Normal adrenal glands. 2 right renal
lesions. An interpolar lesion measures 6 mm and is too small to
entirely characterize. Likely a cyst. A lower pole right renal
lesion measures 9 mm and is most consistent with a cyst.

Corresponding to the CT abnormality, 1.5 cm inter/lower pole left
renal lesion demonstrates T2 hypo intensity on image 42 of series 3
and post-contrast enhancement, including on image 58 of series
01551. No hydronephrosis.

Vascular/Lymphatic: Atherosclerotic irregularity in the aorta and
branch vessels. No retroperitoneal adenopathy. Left renal vein
patent.

Musculoskeletal: No focal osseous abnormality.

Other:  No ascites.
IMPRESSION: 1. Mildly motion degraded exam.
2. Filling defect within the distal common duct, highly suspicious
for periampullary stone. This is positioned more distally than on
08/12/14 CT. Mild secondary biliary ductal dilatation.
3. A left renal lesion which remains highly suspicious for renal
cell carcinoma, likely papillary type.
4. Progressive moderate pancreatitis.

## 2016-08-13 DIAGNOSIS — Z23 Encounter for immunization: Secondary | ICD-10-CM | POA: Diagnosis not present

## 2016-08-19 ENCOUNTER — Other Ambulatory Visit: Payer: Self-pay | Admitting: Internal Medicine

## 2016-08-28 ENCOUNTER — Encounter (INDEPENDENT_AMBULATORY_CARE_PROVIDER_SITE_OTHER): Payer: Medicare Other | Admitting: Ophthalmology

## 2016-08-28 DIAGNOSIS — I10 Essential (primary) hypertension: Secondary | ICD-10-CM | POA: Diagnosis not present

## 2016-08-28 DIAGNOSIS — H43813 Vitreous degeneration, bilateral: Secondary | ICD-10-CM | POA: Diagnosis not present

## 2016-08-28 DIAGNOSIS — H353231 Exudative age-related macular degeneration, bilateral, with active choroidal neovascularization: Secondary | ICD-10-CM

## 2016-08-28 DIAGNOSIS — H33302 Unspecified retinal break, left eye: Secondary | ICD-10-CM

## 2016-08-28 DIAGNOSIS — H35033 Hypertensive retinopathy, bilateral: Secondary | ICD-10-CM

## 2016-09-04 ENCOUNTER — Other Ambulatory Visit: Payer: Self-pay | Admitting: Internal Medicine

## 2016-09-29 ENCOUNTER — Encounter (INDEPENDENT_AMBULATORY_CARE_PROVIDER_SITE_OTHER): Payer: Medicare Other | Admitting: Ophthalmology

## 2016-09-29 DIAGNOSIS — H43813 Vitreous degeneration, bilateral: Secondary | ICD-10-CM

## 2016-09-29 DIAGNOSIS — H35033 Hypertensive retinopathy, bilateral: Secondary | ICD-10-CM | POA: Diagnosis not present

## 2016-09-29 DIAGNOSIS — I10 Essential (primary) hypertension: Secondary | ICD-10-CM

## 2016-09-29 DIAGNOSIS — H353231 Exudative age-related macular degeneration, bilateral, with active choroidal neovascularization: Secondary | ICD-10-CM

## 2016-09-29 DIAGNOSIS — H33302 Unspecified retinal break, left eye: Secondary | ICD-10-CM

## 2016-10-03 ENCOUNTER — Ambulatory Visit (INDEPENDENT_AMBULATORY_CARE_PROVIDER_SITE_OTHER): Payer: Medicare Other | Admitting: Internal Medicine

## 2016-10-03 ENCOUNTER — Encounter: Payer: Self-pay | Admitting: Internal Medicine

## 2016-10-03 VITALS — BP 140/64 | HR 67 | Temp 97.7°F | Resp 16 | Ht 70.0 in | Wt 156.6 lb

## 2016-10-03 DIAGNOSIS — E785 Hyperlipidemia, unspecified: Secondary | ICD-10-CM

## 2016-10-03 DIAGNOSIS — E119 Type 2 diabetes mellitus without complications: Secondary | ICD-10-CM | POA: Diagnosis not present

## 2016-10-03 LAB — LIPID PANEL
CHOLESTEROL: 130 mg/dL (ref 0–200)
HDL: 35.1 mg/dL — AB (ref >39.00)
LDL CALC: 69 mg/dL (ref 0–99)
NonHDL: 95.27
TRIGLYCERIDES: 130 mg/dL (ref 0.0–149.0)
Total CHOL/HDL Ratio: 4
VLDL: 26 mg/dL (ref 0.0–40.0)

## 2016-10-03 LAB — HEMOGLOBIN A1C: Hgb A1c MFr Bld: 6.2 % (ref 4.6–6.5)

## 2016-10-03 NOTE — Progress Notes (Signed)
Subjective:    Patient ID: Jim [name], male    DOB: 07/29/1932, 82 y.o.   MRN: [id number]  DOS:  10/03/2016 Type of visit - description : rov Interval history: In general feeling well, no major concerns. Meds reviewed: Good compliance. No ambulatory CBGs. Ambulatory BPs in the 140s/60 range.  Review of Systems   Past Medical History:  Diagnosis Date  . Cholestatic hepatitis 2014  . COPD (chronic obstructive pulmonary disease) ([place])   . ED (erectile dysfunction)   . Essential hypertension, benign   . Hyperlipidemia   . Personal history of colonic polyps   . Prostate cancer ([place]) 2008   s/p XRT  . Renal mass 08/12/2014  . Syncope 2010   Evaluated by Dr. Caryl Comes - normal LVEF, possibly neurally mediated  . Type 2 diabetes mellitus ([place]) 10/2009    Past Surgical History:  Procedure Laterality Date  . CATARACT EXTRACTION Bilateral   . CHOLECYSTECTOMY N/A 08/22/2014   Procedure: LAPAROSCOPIC CHOLECYSTECTOMY WITH INTRAOPERATIVE CHOLANGIOGRAM;  Surgeon: Gayland Curry, MD;  Location: [facility];  Service: General;  Laterality: N/A;  . COLONOSCOPY    . ERCP N/A 08/16/2014   Procedure: ENDOSCOPIC RETROGRADE CHOLANGIOPANCREATOGRAPHY (ERCP);  Surgeon: Milus Banister, MD;  Location: [facility];  Service: Endoscopy;  Laterality: N/A;  . EYE SURGERY Left 07/17/2014    Social History   Social History  . Marital status: Divorced    Spouse name: N/A  . Number of children: 2  . Years of education: N/A   Occupational History  . Retired  Retired   Social History Main Topics  . Smoking status: Former Smoker    Packs/day: 2.00    Years: 40.00    Types: Cigarettes    Quit date: 11/24/2005  . Smokeless tobacco: Never Used  . Alcohol use 0.0 oz/week     Comment: Rarely  . Drug use: No  . Sexual activity: Not on file   Other Topics Concern  . Not on file   Social History Narrative   Independent on ADL    Lives with son and daughter in law   Has Gk and GGk        Medication  List       Accurate as of 10/03/16  4:44 PM. Always use your most recent med list.          albuterol (2.5 MG/3ML) 0.083% nebulizer solution Commonly known as:  PROVENTIL Take 3 mLs (2.5 mg total) by nebulization 4 (four) times daily as needed.   aspirin 81 MG tablet Take 81 mg by mouth daily.   atorvastatin 20 MG tablet Commonly known as:  LIPITOR Take 10 mg by mouth daily.   BESIVANCE 0.6 % Susp Generic drug:  Besifloxacin HCl As needed  Both eyes   carvedilol 25 MG tablet Commonly known as:  COREG Take 1 tablet (25 mg total) by mouth 2 (two) times daily with a meal.   DULERA 200-5 MCG/ACT Aero Generic drug:  mometasone-formoterol take 2 puffs twice a day   furosemide 40 MG tablet Commonly known as:  LASIX Take 1 tablet (40 mg total) by mouth 2 (two) times daily.   Ipratropium-Albuterol 20-100 MCG/ACT Aers respimat Commonly known as:  COMBIVENT RESPIMAT Inhale 1 puff into the lungs every 6 (six) hours as needed for wheezing or shortness of breath.   losartan 100 MG tablet Commonly known as:  COZAAR Take 1 tablet (100 mg total) by mouth daily.   metFORMIN 500 MG tablet Commonly  known as:  GLUCOPHAGE take 2 tablets by mouth every morning with BREAKFAST AND 1 TABLET EVERY EVENING WITH DINNER.   PRESERVISION AREDS PO Take 2 tablets by mouth daily.          Objective:   Physical Exam BP 140/64 (BP Location: Right Arm, Patient Position: Sitting, Cuff Size: Normal)   Pulse 67   Temp 97.7 F (36.5 C) (Oral)   Resp 16   Ht 5\' 10"  (1.778 m)   Wt 156 lb 9.6 oz (71 kg)   SpO2 98% Comment: RA  BMI 22.47 kg/m  General:   Well developed, well nourished . NAD.  HEENT:  Normocephalic . Face symmetric, atraumatic Lungs:  CTA B Normal respiratory effort, no intercostal retractions, no accessory muscle use. Heart: RRR,  no murmur.  No pretibial edema bilaterally  DIABETIC FEET EXAM: No lower extremity edema Normal pedal pulses bilaterally Skin normal, nails  dystrophic, no calluses Pinprick examination of the feet normal. Neurologic:  alert & oriented X3.  Speech normal, gait appropriate for age and unassisted Psych--  Cognition and judgment appear intact.  Cooperative with normal attention span and concentration.  Behavior appropriate. No anxious or depressed appearing.      Assessment & Plan:   Assessment > DM HTN Hyperlipidemia COPD, HR CT chest   >>> 01/2016 , saw Dr Melvyn Novas Renal mass 9- 2015, dr Karsten Ro, sees urology next visit due  11-2016 H/o Prostate cancer 2008 XRT Syncope 2010 saw Dr. Caryl Comes H/o Cholestatic hepatitis- s/p ERCP 2014,  cholecystectomy 2015  PLAN: DM: Continue metformin, check A1c. Feet exam normal HTN: Controlled, continue losartan, carvedilol, Lasix. Last BMP satisfactory High cholesterol: Last LDL was low, Lipitor decreased from 20 mg to 10 mg, check a FLP  COPD: rarely  use rescue inhalers RTC 03-2016 CPX

## 2016-10-03 NOTE — Assessment & Plan Note (Signed)
PLAN: DM: Continue metformin, check A1c. Feet exam normal HTN: Controlled, continue losartan, carvedilol, Lasix. Last BMP satisfactory High cholesterol: Last LDL was low, Lipitor decreased from 20 mg to 10 mg, check a FLP  COPD: rarely  use rescue inhalers RTC 03-2016 CPX

## 2016-10-03 NOTE — Patient Instructions (Addendum)
GO TO THE LAB : Get the blood work     GO TO THE FRONT DESK Schedule your next appointment for a  Physical exam by 03-2017

## 2016-10-03 NOTE — Progress Notes (Signed)
Pre visit review using our clinic review tool, if applicable. No additional management support is needed unless otherwise documented below in the visit note. 

## 2016-10-15 ENCOUNTER — Other Ambulatory Visit: Payer: Self-pay | Admitting: Internal Medicine

## 2016-10-15 NOTE — Telephone Encounter (Signed)
Forwarding to Pt's Pulmonologist.

## 2016-10-15 NOTE — Telephone Encounter (Signed)
Last ov:     If you are satisfied with your treatment plan,  let your doctor know and he/she can either refill your medications or you can return here when your prescription runs out.     If in any way you are not 100% satisfied,  please tell us.  If 100% better, tell your friends!   If pt wants me to refill it then will need to return - ok to see NP   Copy to Dr Larose Kells

## 2016-10-25 ENCOUNTER — Other Ambulatory Visit: Payer: Self-pay | Admitting: Internal Medicine

## 2016-10-29 ENCOUNTER — Other Ambulatory Visit: Payer: Self-pay | Admitting: Internal Medicine

## 2016-11-03 ENCOUNTER — Encounter (INDEPENDENT_AMBULATORY_CARE_PROVIDER_SITE_OTHER): Payer: Medicare Other | Admitting: Ophthalmology

## 2016-11-03 DIAGNOSIS — H353231 Exudative age-related macular degeneration, bilateral, with active choroidal neovascularization: Secondary | ICD-10-CM

## 2016-11-03 DIAGNOSIS — H35033 Hypertensive retinopathy, bilateral: Secondary | ICD-10-CM

## 2016-11-03 DIAGNOSIS — I10 Essential (primary) hypertension: Secondary | ICD-10-CM | POA: Diagnosis not present

## 2016-11-03 DIAGNOSIS — H43813 Vitreous degeneration, bilateral: Secondary | ICD-10-CM | POA: Diagnosis not present

## 2016-11-12 ENCOUNTER — Other Ambulatory Visit: Payer: Self-pay | Admitting: Internal Medicine

## 2016-12-08 ENCOUNTER — Encounter (INDEPENDENT_AMBULATORY_CARE_PROVIDER_SITE_OTHER): Payer: Medicare Other | Admitting: Ophthalmology

## 2016-12-08 DIAGNOSIS — H353231 Exudative age-related macular degeneration, bilateral, with active choroidal neovascularization: Secondary | ICD-10-CM | POA: Diagnosis not present

## 2016-12-08 DIAGNOSIS — H35033 Hypertensive retinopathy, bilateral: Secondary | ICD-10-CM | POA: Diagnosis not present

## 2016-12-08 DIAGNOSIS — I10 Essential (primary) hypertension: Secondary | ICD-10-CM

## 2016-12-08 DIAGNOSIS — H43813 Vitreous degeneration, bilateral: Secondary | ICD-10-CM | POA: Diagnosis not present

## 2016-12-08 LAB — HM DIABETES EYE EXAM

## 2016-12-18 ENCOUNTER — Encounter: Payer: Self-pay | Admitting: Internal Medicine

## 2017-01-05 DIAGNOSIS — D49512 Neoplasm of unspecified behavior of left kidney: Secondary | ICD-10-CM | POA: Diagnosis not present

## 2017-01-05 DIAGNOSIS — Z8546 Personal history of malignant neoplasm of prostate: Secondary | ICD-10-CM | POA: Diagnosis not present

## 2017-01-12 ENCOUNTER — Encounter (INDEPENDENT_AMBULATORY_CARE_PROVIDER_SITE_OTHER): Payer: Medicare Other | Admitting: Ophthalmology

## 2017-01-12 DIAGNOSIS — H35033 Hypertensive retinopathy, bilateral: Secondary | ICD-10-CM

## 2017-01-12 DIAGNOSIS — H43813 Vitreous degeneration, bilateral: Secondary | ICD-10-CM | POA: Diagnosis not present

## 2017-01-12 DIAGNOSIS — I10 Essential (primary) hypertension: Secondary | ICD-10-CM | POA: Diagnosis not present

## 2017-01-12 DIAGNOSIS — H33302 Unspecified retinal break, left eye: Secondary | ICD-10-CM | POA: Diagnosis not present

## 2017-01-12 DIAGNOSIS — H353231 Exudative age-related macular degeneration, bilateral, with active choroidal neovascularization: Secondary | ICD-10-CM

## 2017-02-19 ENCOUNTER — Encounter (INDEPENDENT_AMBULATORY_CARE_PROVIDER_SITE_OTHER): Payer: Medicare Other | Admitting: Ophthalmology

## 2017-02-19 DIAGNOSIS — H33302 Unspecified retinal break, left eye: Secondary | ICD-10-CM

## 2017-02-19 DIAGNOSIS — H353231 Exudative age-related macular degeneration, bilateral, with active choroidal neovascularization: Secondary | ICD-10-CM | POA: Diagnosis not present

## 2017-02-19 DIAGNOSIS — H43813 Vitreous degeneration, bilateral: Secondary | ICD-10-CM | POA: Diagnosis not present

## 2017-02-19 DIAGNOSIS — H35033 Hypertensive retinopathy, bilateral: Secondary | ICD-10-CM

## 2017-02-19 DIAGNOSIS — I10 Essential (primary) hypertension: Secondary | ICD-10-CM | POA: Diagnosis not present

## 2017-03-26 ENCOUNTER — Encounter (INDEPENDENT_AMBULATORY_CARE_PROVIDER_SITE_OTHER): Payer: Medicare Other | Admitting: Ophthalmology

## 2017-03-26 DIAGNOSIS — H353231 Exudative age-related macular degeneration, bilateral, with active choroidal neovascularization: Secondary | ICD-10-CM | POA: Diagnosis not present

## 2017-03-26 DIAGNOSIS — H35033 Hypertensive retinopathy, bilateral: Secondary | ICD-10-CM

## 2017-03-26 DIAGNOSIS — H43813 Vitreous degeneration, bilateral: Secondary | ICD-10-CM | POA: Diagnosis not present

## 2017-03-26 DIAGNOSIS — H33302 Unspecified retinal break, left eye: Secondary | ICD-10-CM

## 2017-03-26 DIAGNOSIS — I10 Essential (primary) hypertension: Secondary | ICD-10-CM

## 2017-03-30 ENCOUNTER — Telehealth: Payer: Self-pay | Admitting: *Deleted

## 2017-03-30 NOTE — Telephone Encounter (Signed)
Received confirmation of Physician Order to be signed and faxed to [facility] [place] at [phone]; forwarded to provider/SLS 05/07

## 2017-03-31 ENCOUNTER — Telehealth: Payer: Self-pay | Admitting: *Deleted

## 2017-03-31 NOTE — Telephone Encounter (Signed)
Called patient to schedule AWV w/ [facility]. Unable to understanding voicemail recording, so did not leave message.

## 2017-04-01 NOTE — Telephone Encounter (Signed)
Forms faxed to [place] [facility] at [phone]. Received fax confirmation and forms sent for scanning.

## 2017-04-05 ENCOUNTER — Other Ambulatory Visit: Payer: Self-pay | Admitting: Internal Medicine

## 2017-04-08 ENCOUNTER — Ambulatory Visit (INDEPENDENT_AMBULATORY_CARE_PROVIDER_SITE_OTHER): Payer: Medicare Other | Admitting: Internal Medicine

## 2017-04-08 ENCOUNTER — Encounter: Payer: Self-pay | Admitting: Internal Medicine

## 2017-04-08 VITALS — BP 149/74 | HR 68 | Temp 98.0°F | Resp 16 | Ht 67.7 in | Wt 159.4 lb

## 2017-04-08 DIAGNOSIS — Z23 Encounter for immunization: Secondary | ICD-10-CM | POA: Diagnosis not present

## 2017-04-08 DIAGNOSIS — J449 Chronic obstructive pulmonary disease, unspecified: Secondary | ICD-10-CM | POA: Diagnosis not present

## 2017-04-08 DIAGNOSIS — E785 Hyperlipidemia, unspecified: Secondary | ICD-10-CM

## 2017-04-08 DIAGNOSIS — Z0000 Encounter for general adult medical examination without abnormal findings: Secondary | ICD-10-CM

## 2017-04-08 DIAGNOSIS — E119 Type 2 diabetes mellitus without complications: Secondary | ICD-10-CM | POA: Diagnosis not present

## 2017-04-08 DIAGNOSIS — I10 Essential (primary) hypertension: Secondary | ICD-10-CM

## 2017-04-08 LAB — LIPID PANEL
CHOL/HDL RATIO: 4
Cholesterol: 152 mg/dL (ref 0–200)
HDL: 41.4 mg/dL (ref >39.00)
LDL Cholesterol: 84 mg/dL (ref 0–99)
NONHDL: 110.27
TRIGLYCERIDES: 130 mg/dL (ref 0.0–149.0)
VLDL: 26 mg/dL (ref 0.0–40.0)

## 2017-04-08 LAB — COMPREHENSIVE METABOLIC PANEL
ALT: 11 U/L (ref 0–53)
AST: 12 U/L (ref 0–37)
Albumin: 4.5 g/dL (ref 3.5–5.2)
Alkaline Phosphatase: 92 U/L (ref 39–117)
BILIRUBIN TOTAL: 0.6 mg/dL (ref 0.2–1.2)
BUN: 26 mg/dL — AB (ref 6–23)
CO2: 27 meq/L (ref 19–32)
CREATININE: 1.23 mg/dL (ref 0.40–1.50)
Calcium: 9.8 mg/dL (ref 8.4–10.5)
Chloride: 100 mEq/L (ref 96–112)
GFR: 59.77 mL/min — ABNORMAL LOW (ref >60.00)
GLUCOSE: 124 mg/dL — AB (ref 70–99)
Potassium: 4.5 mEq/L (ref 3.5–5.1)
Sodium: 136 mEq/L (ref 135–145)
Total Protein: 7.7 g/dL (ref 6.0–8.3)

## 2017-04-08 LAB — CBC
HCT: 37.7 % — ABNORMAL LOW (ref 39.0–52.0)
Hemoglobin: 12.9 g/dL — ABNORMAL LOW (ref 13.0–17.0)
MCHC: 34.3 g/dL (ref 30.0–36.0)
MCV: 91.7 fl (ref 78.0–100.0)
PLATELETS: 271 10*3/uL (ref 150.0–400.0)
RBC: 4.11 Mil/uL — AB (ref 4.22–5.81)
RDW: 14.3 % (ref 11.5–15.5)
WBC: 9.5 10*3/uL (ref 4.0–10.5)

## 2017-04-08 LAB — HEMOGLOBIN A1C: Hgb A1c MFr Bld: 6.2 % (ref 4.6–6.5)

## 2017-04-08 NOTE — Assessment & Plan Note (Addendum)
--  Td 09 ; pneumonia shot  2010, booster 03-2017 ; prevnar-- 2010; zostavax at the pharmacy ~ 2012. Discussed shingrex --[facility] -- no further cscopes, see previous entries  --h/o prostate ca, sees urology  --Not a candidate for lung cancer screening -- Diet-exercise discussed  --Labs: CMP, CBC, A1c, FLP

## 2017-04-08 NOTE — Progress Notes (Signed)
Subjective:    Patient ID: Jim [name], male    DOB: 1932-04-24, 83 y.o.   MRN: [id number]  DOS:  04/08/2017 Type of visit - description : Routine visit Interval history: HTN: Good compliance of medication, BP upon arrival to the office was slightly elevated, recheck BP: 149/74. Did not have his medications today High cholesterol: Due for labs, good medication compliance COPD: Good medication compliance DM: On metformin, good compliance. No ambulatory CBGs next  BP Readings from Last 3 Encounters:  04/08/17 (!) 149/74  10/03/16 140/64  04/02/16 128/68      Review of Systems  denies chest pain, some DOE occasionally. He has mild cough, mostly in the morning, he does bring up clear sputum, no hemoptysis. No nausea, vomiting, diarrhea. No blood in the stools. For the last few weeks, ears feel stopped up, no discharge, pain or decrease hearing   Past Medical History:  Diagnosis Date  . Cholestatic hepatitis 2014  . COPD (chronic obstructive pulmonary disease) ([place])   . ED (erectile dysfunction)   . Essential hypertension, benign   . Hyperlipidemia   . Personal history of colonic polyps   . Prostate cancer ([place]) 2008   s/p XRT  . Renal mass 08/12/2014  . Syncope 2010   Evaluated by Dr. Caryl Comes - normal LVEF, possibly neurally mediated  . Type 2 diabetes mellitus ([place]) 10/2009    Past Surgical History:  Procedure Laterality Date  . CATARACT EXTRACTION Bilateral   . CHOLECYSTECTOMY N/A 08/22/2014   Procedure: LAPAROSCOPIC CHOLECYSTECTOMY WITH INTRAOPERATIVE CHOLANGIOGRAM;  Surgeon: Gayland Curry, MD;  Location: [facility];  Service: General;  Laterality: N/A;  . COLONOSCOPY    . ERCP N/A 08/16/2014   Procedure: ENDOSCOPIC RETROGRADE CHOLANGIOPANCREATOGRAPHY (ERCP);  Surgeon: Milus Banister, MD;  Location: [facility];  Service: Endoscopy;  Laterality: N/A;  . EYE SURGERY Left 07/17/2014    Social History   Social History  . Marital status: Divorced    Spouse name: N/A  .  Number of children: 2  . Years of education: N/A   Occupational History  . Retired  Retired   Social History Main Topics  . Smoking status: Former Smoker    Packs/day: 2.00    Years: 40.00    Types: Cigarettes    Quit date: 11/24/2005  . Smokeless tobacco: Never Used  . Alcohol use 0.0 oz/week     Comment: Rarely  . Drug use: No  . Sexual activity: Not on file   Other Topics Concern  . Not on file   Social History Narrative   Independent on ADL    Lives with son and daughter in law   Has Gk and GGk      Allergies as of 04/08/2017      Reactions   Amlodipine Besylate    REACTION: swelling   Cardura [doxazosin Mesylate] Itching   Clonidine Derivatives Itching   Fluticasone-salmeterol Hives, Itching   Nifedipine    Swelling, rash, itching      Medication List       Accurate as of 04/08/17  9:25 AM. Always use your most recent med list.          albuterol (2.5 MG/3ML) 0.083% nebulizer solution Commonly known as:  PROVENTIL Take 3 mLs (2.5 mg total) by nebulization 4 (four) times daily as needed.   aspirin 81 MG tablet Take 81 mg by mouth daily.   atorvastatin 20 MG tablet Commonly known as:  LIPITOR Take 0.5 tablets (10 mg  total) by mouth daily.   BESIVANCE 0.6 % Susp Generic drug:  Besifloxacin HCl As needed  Both eyes   carvedilol 25 MG tablet Commonly known as:  COREG Take 1 tablet (25 mg total) by mouth 2 (two) times daily with a meal.   COMBIVENT RESPIMAT 20-100 MCG/ACT Aers respimat Generic drug:  Ipratropium-Albuterol inhale 1 puff by mouth every 6 hours if needed for shortness of breath or wheezing   DULERA 200-5 MCG/ACT Aero Generic drug:  mometasone-formoterol take 2 puffs twice a day   furosemide 40 MG tablet Commonly known as:  LASIX Take 1 tablet (40 mg total) by mouth 2 (two) times daily.   losartan 100 MG tablet Commonly known as:  COZAAR Take 1 tablet (100 mg total) by mouth daily.   metFORMIN 500 MG tablet Commonly known as:   GLUCOPHAGE Take 2 tablets by mouth every morning with breakfast and 1 tablet every evening with dinner.   PRESERVISION AREDS PO Take 2 tablets by mouth daily.          Objective:   Physical Exam BP (!) 149/74   Pulse 68   Temp 98 F (36.7 C) (Oral)   Resp 16   Ht 5' 7.7" (1.72 m)   Wt 159 lb 6.4 oz (72.3 kg)   SpO2 97%   BMI 24.45 kg/m  General:   Well developed, well nourished . NAD.  HEENT:  Normocephalic . Face symmetric, atraumatic. TMs normal Lungs:  decrease breath sounds but clear Normal respiratory effort, no intercostal retractions, no accessory muscle use. Heart: RRR,  no murmur.  no pretibial edema bilaterally  Abdomen:  Not distended, soft, non-tender. No rebound or rigidity. No bruit   Skin: Not pale. Not jaundice Neurologic:  alert & oriented X3.  Speech normal, gait appropriate for age and unassisted Psych--  Cognition and judgment appear intact.  Cooperative with normal attention span and concentration.  Behavior appropriate. No anxious or depressed appearing.     Assessment & Plan:   Assessment   DM HTN Hyperlipidemia COPD, HR CT chest   >>> 01/2016 , saw Dr Melvyn Novas Renal mass 9- 2015, dr Karsten Ro, sees urology next visit due  11-2016 H/o Prostate cancer 2008 XRT Syncope 2010 saw Dr. Caryl Comes H/o Cholestatic hepatitis- s/p ERCP 2014,  cholecystectomy 2015  PLAN: DM: Continue with metformin, check A1c HTN: Continue losartan, Lasix. BP slightly elevated today, but reports ambulatory BPs normal. Will ask to continue monitoring. Hyperlipidemia: Continue Lipitor, check labs. COPD: Symptoms controlled. Continue Combivent, Dulera and albuterol. Ears stopped up: exam (-), trial with Flonase RTC 4  Months

## 2017-04-08 NOTE — Patient Instructions (Addendum)
GO TO THE LAB : Get the blood work     GO TO THE FRONT DESK Schedule your next appointment for a  routine checkup in 4 months    Check the  blood pressure 2  times a   week   Be sure your blood pressure is between 110/65 and  140/85. If it is consistently higher or lower, let me know  Use Flonase 2 sprays on each side of the nose daily for a month.  Think about immunization for shingles called [facility]    Jim [name] , Thank you for taking time to come for your Medicare Wellness Visit. I appreciate your ongoing commitment to your health goals. Please review the following plan we discussed and let me know if I can assist you in the future.   Create and bring a copy of your advance directives to your next office visit.  These are the goals we discussed: Goals    . Increase water intake       This is a list of the screening recommended for you and due dates:  Health Maintenance  Topic Date Due  . Hemoglobin A1C  04/02/2017  . Flu Shot  06/24/2017  . Complete foot exam   10/03/2017  . Eye exam for diabetics  12/08/2017  . Tetanus Vaccine  09/06/2018  . Pneumonia vaccines  Completed    Preventive Care 35 Years and Older, Male Preventive care refers to lifestyle choices and visits with your health care provider that can promote health and wellness. What does preventive care include?  A yearly physical exam. This is also called an annual well check.  Dental exams once or twice a year.  Routine eye exams. Ask your health care provider how often you should have your eyes checked.  Personal lifestyle choices, including:  Daily care of your teeth and gums.  Regular physical activity.  Eating a healthy diet.  Avoiding tobacco and drug use.  Limiting alcohol use.  Practicing safe sex.  Taking low doses of aspirin every day.  Taking vitamin and mineral supplements as recommended by your health care provider. What happens during an annual well check? The services  and screenings done by your health care provider during your annual well check will depend on your age, overall health, lifestyle risk factors, and family history of disease. Counseling  Your health care provider may ask you questions about your:  Alcohol use.  Tobacco use.  Drug use.  Emotional well-being.  Home and relationship well-being.  Sexual activity.  Eating habits.  History of falls.  Memory and ability to understand (cognition).  Work and work Statistician. Screening  You may have the following tests or measurements:  Height, weight, and BMI.  Blood pressure.  Lipid and cholesterol levels. These may be checked every 5 years, or more frequently if you are over 51 years old.  Skin check.  Lung cancer screening. You may have this screening every year starting at age 39 if you have a 30-pack-year history of smoking and currently smoke or have quit within the past 15 years.  Fecal occult blood test (FOBT) of the stool. You may have this test every year starting at age 72.  Flexible sigmoidoscopy or colonoscopy. You may have a sigmoidoscopy every 5 years or a colonoscopy every 10 years starting at age 13.  Prostate cancer screening. Recommendations will vary depending on your family history and other risks.  Hepatitis C blood test.  Hepatitis B blood test.  Sexually transmitted  disease (STD) testing.  Diabetes screening. This is done by checking your blood sugar (glucose) after you have not eaten for a while (fasting). You may have this done every 1-3 years.  Abdominal aortic aneurysm (AAA) screening. You may need this if you are a current or former smoker.  Osteoporosis. You may be screened starting at age 44 if you are at high risk. Talk with your health care provider about your test results, treatment options, and if necessary, the need for more tests. Vaccines  Your health care provider may recommend certain vaccines, such as:  Influenza vaccine. This  is recommended every year.  Tetanus, diphtheria, and acellular pertussis (Tdap, Td) vaccine. You may need a Td booster every 10 years.  Varicella vaccine. You may need this if you have not been vaccinated.  Zoster vaccine. You may need this after age 34.  Measles, mumps, and rubella (MMR) vaccine. You may need at least one dose of MMR if you were born in 1957 or later. You may also need a second dose.  Pneumococcal 13-valent conjugate (PCV13) vaccine. One dose is recommended after age 30.  Pneumococcal polysaccharide (PPSV23) vaccine. One dose is recommended after age 62.  Meningococcal vaccine. You may need this if you have certain conditions.  Hepatitis A vaccine. You may need this if you have certain conditions or if you travel or work in places where you may be exposed to hepatitis A.  Hepatitis B vaccine. You may need this if you have certain conditions or if you travel or work in places where you may be exposed to hepatitis B.  Haemophilus influenzae type b (Hib) vaccine. You may need this if you have certain risk factors. Talk to your health care provider about which screenings and vaccines you need and how often you need them. This information is not intended to replace advice given to you by your health care provider. Make sure you discuss any questions you have with your health care provider. Document Released: 12/07/2015 Document Revised: 07/30/2016 Document Reviewed: 09/11/2015 Elsevier Interactive Patient Education  2017 [employer].

## 2017-04-08 NOTE — Progress Notes (Signed)
Subjective:   Jim [name] is a 83 y.o. male who presents for Medicare Annual/Subsequent preventive examination.  Review of Systems:  No ROS.  Medicare Wellness Visit.  Cardiac Risk Factors include: advanced age (>31men, >52 women);hypertension;male gender  Sleep patterns: no sleep issues.   Home Safety/Smoke Alarms: Feels safe in home. Smoke alarms in place.    Living environment; residence and Firearm Safety: Lives w/ son and daughter in the law. 1-story house/ trailer, firearms stored safely. Seat Belt Safety/Bike Helmet: Wears seat belt.   Male:   CCS- Aged out     PSA-  Lab Results  Component Value Date   PSA 0.16 12/19/2015   PSA 0.24 05/16/2015   PSA 6.28 (H) 08/04/2007       Objective:    Vitals: BP (!) 149/74   Pulse 68   Temp 98 F (36.7 C) (Oral)   Resp 16   Ht 5' 7.7" (1.72 m)   Wt 159 lb 6.4 oz (72.3 kg)   SpO2 97%   BMI 24.45 kg/m   Body mass index is 24.45 kg/m.  Tobacco History  Smoking Status  . Former Smoker  . Packs/day: 2.00  . Years: 40.00  . Types: Cigarettes  . Quit date: 11/24/2005  Smokeless Tobacco  . Never Used     Counseling given: Not Answered   Past Medical History:  Diagnosis Date  . Cholestatic hepatitis 2014  . COPD (chronic obstructive pulmonary disease) ([place])   . ED (erectile dysfunction)   . Essential hypertension, benign   . Hyperlipidemia   . Personal history of colonic polyps   . Prostate cancer ([place]) 2008   s/p XRT  . Renal mass 08/12/2014  . Syncope 2010   Evaluated by Dr. Caryl Comes - normal LVEF, possibly neurally mediated  . Type 2 diabetes mellitus ([place]) 10/2009   Past Surgical History:  Procedure Laterality Date  . CATARACT EXTRACTION Bilateral   . CHOLECYSTECTOMY N/A 08/22/2014   Procedure: LAPAROSCOPIC CHOLECYSTECTOMY WITH INTRAOPERATIVE CHOLANGIOGRAM;  Surgeon: Gayland Curry, MD;  Location: [facility];  Service: General;  Laterality: N/A;  . COLONOSCOPY    . ERCP N/A 08/16/2014   Procedure:  ENDOSCOPIC RETROGRADE CHOLANGIOPANCREATOGRAPHY (ERCP);  Surgeon: Milus Banister, MD;  Location: [facility];  Service: Endoscopy;  Laterality: N/A;  . EYE SURGERY Left 07/17/2014   Family History  Problem Relation Age of Onset  . Cirrhosis Brother   . Dementia Mother   . Colon cancer Neg Hx   . Prostate cancer Neg Hx   . CAD Neg Hx    History  Sexual Activity  . Sexual activity: Not on file    Outpatient Encounter Prescriptions as of 04/08/2017  Medication Sig  . albuterol (PROVENTIL) (2.5 MG/3ML) 0.083% nebulizer solution Take 3 mLs (2.5 mg total) by nebulization 4 (four) times daily as needed.  Marland Kitchen aspirin 81 MG tablet Take 81 mg by mouth daily.    Marland Kitchen atorvastatin (LIPITOR) 20 MG tablet Take 0.5 tablets (10 mg total) by mouth daily.  Marland Kitchen BESIVANCE 0.6 % SUSP As needed  Both eyes  . carvedilol (COREG) 25 MG tablet Take 1 tablet (25 mg total) by mouth 2 (two) times daily with a meal.  . COMBIVENT RESPIMAT 20-100 MCG/ACT AERS respimat inhale 1 puff by mouth every 6 hours if needed for shortness of breath or wheezing  . DULERA 200-5 MCG/ACT AERO take 2 puffs twice a day  . furosemide (LASIX) 40 MG tablet Take 1 tablet (40 mg total)  by mouth 2 (two) times daily.  Marland Kitchen losartan (COZAAR) 100 MG tablet Take 1 tablet (100 mg total) by mouth daily.  . metFORMIN (GLUCOPHAGE) 500 MG tablet Take 2 tablets by mouth every morning with breakfast and 1 tablet every evening with dinner.  . Multiple Vitamins-Minerals (PRESERVISION AREDS PO) Take 2 tablets by mouth daily.     No facility-administered encounter medications on file as of 04/08/2017.     Activities of Daily Living In your present state of health, do you have any difficulty performing the following activities: 04/08/2017 10/03/2016  Hearing? N N  Vision? N N  Difficulty concentrating or making decisions? N N  Walking or climbing stairs? N N  Dressing or bathing? N N  Doing errands, shopping? N N  Preparing Food and eating ? N -  Using the  Toilet? N -  In the past six months, have you accidently leaked urine? N -  Do you have problems with loss of bowel control? N -  Managing your Medications? N -  Managing your Finances? N -  Housekeeping or managing your Housekeeping? N -  Some recent data might be hidden    Patient Care Team: Colon Branch, MD as PCP - General Milus Banister, MD as Attending Physician (Gastroenterology) Hayden Pedro, MD as Consulting Physician (Ophthalmology) Kathie Rhodes, MD as Consulting Physician (Urology) Tanda Rockers, MD as Consulting Physician (Pulmonary Disease)   Assessment:    Physical assessment deferred to PCP.  Exercise Activities and Dietary recommendations Current Exercise Habits: The patient does not participate in regular exercise at present but stays active in the yard taking care of his fruit trees.  Diet (meal preparation, eat out, water intake, caffeinated beverages, dairy products, fruits and vegetables): on average, 2-3 meals per day. Likes to eat chicken in the evenings. Regular diet. Snack throughout the day. Drinks water mostly in the evenings. Breakfast: bacon and eggs   Goals    . Increase water intake      Fall Risk Fall Risk  04/08/2017 10/03/2016 04/02/2016 10/29/2015 06/27/2015  Falls in the past year? No No No No No   Depression Screen PHQ 2/9 Scores 04/08/2017 10/03/2016 04/02/2016 10/29/2015  PHQ - 2 Score 0 0 0 0  Exception Documentation - - - Patient refusal    Cognitive Function MMSE - Mini Mental State Exam 04/08/2017  Orientation to time 5  Orientation to Place 5  Registration 3  Attention/ Calculation 5  Recall 3  Language- name 2 objects 2  Language- repeat 1  Language- follow 3 step command 3  Language- read & follow direction 1  Write a sentence 1  Copy design 1  Total score 30        Immunization History  Administered Date(s) Administered  . H1N1 12/04/2008  . Influenza Split 09/03/2011, 08/24/2012, 08/08/2014  . Influenza Whole  09/17/2007, 09/06/2008, 10/15/2009, 09/18/2010  . Influenza, High Dose Seasonal PF 08/12/2013  . Influenza-Unspecified 08/02/2015, 08/13/2016  . Pneumococcal Conjugate-13 10/15/2009  . Pneumococcal Polysaccharide-23 11/25/2003, 10/15/2009, 06/27/2015  . Td 09/06/2008  . Zoster 11/24/2010   Screening Tests Health Maintenance  Topic Date Due  . HEMOGLOBIN A1C  04/02/2017  . INFLUENZA VACCINE  06/24/2017  . FOOT EXAM  10/03/2017  . OPHTHALMOLOGY EXAM  12/08/2017  . TETANUS/TDAP  09/06/2018  . PNA vac Low Risk Adult  Completed      Plan:    Follow-up w/ PCP as directed.  Create and bring a copy of your advance  directives to your next office visit.  I have personally reviewed and noted the following in the patient's chart:   . Medical and social history . Use of alcohol, tobacco or illicit drugs  . Current medications and supplements . Functional ability and status . Nutritional status . Physical activity . Advanced directives . List of other physicians . Vitals . Screenings to include cognitive, depression, and falls . Referrals and appointments  In addition, I have reviewed and discussed with patient certain preventive protocols, quality metrics, and best practice recommendations. A written personalized care plan for preventive services as well as general preventive health recommendations were provided to patient.     Dorrene German, RN  04/08/2017  [name], MD

## 2017-04-09 NOTE — Assessment & Plan Note (Signed)
DM: Continue with metformin, check A1c HTN: Continue losartan, Lasix. BP slightly elevated today, but reports ambulatory BPs normal. Will ask to continue monitoring. Hyperlipidemia: Continue Lipitor, check labs. COPD: Symptoms controlled. Continue Combivent, Dulera and albuterol. Ears stopped up: exam (-), trial with Flonase RTC 4  Months

## 2017-04-10 ENCOUNTER — Other Ambulatory Visit: Payer: Self-pay | Admitting: Internal Medicine

## 2017-04-25 ENCOUNTER — Other Ambulatory Visit: Payer: Self-pay | Admitting: Internal Medicine

## 2017-04-30 ENCOUNTER — Encounter (INDEPENDENT_AMBULATORY_CARE_PROVIDER_SITE_OTHER): Payer: Medicare Other | Admitting: Ophthalmology

## 2017-04-30 DIAGNOSIS — H353231 Exudative age-related macular degeneration, bilateral, with active choroidal neovascularization: Secondary | ICD-10-CM

## 2017-04-30 DIAGNOSIS — H35033 Hypertensive retinopathy, bilateral: Secondary | ICD-10-CM

## 2017-04-30 DIAGNOSIS — H43813 Vitreous degeneration, bilateral: Secondary | ICD-10-CM

## 2017-04-30 DIAGNOSIS — H33302 Unspecified retinal break, left eye: Secondary | ICD-10-CM

## 2017-04-30 DIAGNOSIS — I10 Essential (primary) hypertension: Secondary | ICD-10-CM

## 2017-05-07 ENCOUNTER — Other Ambulatory Visit: Payer: Self-pay | Admitting: Internal Medicine

## 2017-05-22 ENCOUNTER — Other Ambulatory Visit: Payer: Self-pay | Admitting: Internal Medicine

## 2017-05-27 ENCOUNTER — Encounter (HOSPITAL_COMMUNITY): Payer: Self-pay | Admitting: Emergency Medicine

## 2017-05-27 ENCOUNTER — Emergency Department (HOSPITAL_COMMUNITY)
Admission: EM | Admit: 2017-05-27 | Discharge: 2017-05-27 | Disposition: A | Discharge status: Home / Self Care | Payer: Medicare Other | Attending: Emergency Medicine | Admitting: Emergency Medicine

## 2017-05-27 DIAGNOSIS — Z7982 Long term (current) use of aspirin: Secondary | ICD-10-CM | POA: Insufficient documentation

## 2017-05-27 DIAGNOSIS — Z7984 Long term (current) use of oral hypoglycemic drugs: Secondary | ICD-10-CM | POA: Insufficient documentation

## 2017-05-27 DIAGNOSIS — Z79899 Other long term (current) drug therapy: Secondary | ICD-10-CM | POA: Diagnosis not present

## 2017-05-27 DIAGNOSIS — Y939 Activity, unspecified: Secondary | ICD-10-CM | POA: Insufficient documentation

## 2017-05-27 DIAGNOSIS — Y999 Unspecified external cause status: Secondary | ICD-10-CM | POA: Insufficient documentation

## 2017-05-27 DIAGNOSIS — E119 Type 2 diabetes mellitus without complications: Secondary | ICD-10-CM | POA: Insufficient documentation

## 2017-05-27 DIAGNOSIS — Y929 Unspecified place or not applicable: Secondary | ICD-10-CM | POA: Insufficient documentation

## 2017-05-27 DIAGNOSIS — I10 Essential (primary) hypertension: Secondary | ICD-10-CM

## 2017-05-27 DIAGNOSIS — Z87891 Personal history of nicotine dependence: Secondary | ICD-10-CM | POA: Insufficient documentation

## 2017-05-27 DIAGNOSIS — W57XXXA Bitten or stung by nonvenomous insect and other nonvenomous arthropods, initial encounter: Secondary | ICD-10-CM | POA: Insufficient documentation

## 2017-05-27 DIAGNOSIS — S70361A Insect bite (nonvenomous), right thigh, initial encounter: Secondary | ICD-10-CM | POA: Diagnosis not present

## 2017-05-27 DIAGNOSIS — J449 Chronic obstructive pulmonary disease, unspecified: Secondary | ICD-10-CM | POA: Diagnosis not present

## 2017-05-27 DIAGNOSIS — R21 Rash and other nonspecific skin eruption: Secondary | ICD-10-CM | POA: Diagnosis present

## 2017-05-27 LAB — BASIC METABOLIC PANEL
ANION GAP: 11 (ref 5–15)
BUN: 20 mg/dL (ref 6–20)
CALCIUM: 8.7 mg/dL — AB (ref 8.9–10.3)
CO2: 24 mmol/L (ref 22–32)
Chloride: 104 mmol/L (ref 101–111)
Creatinine, Ser: 1.18 mg/dL (ref 0.61–1.24)
GFR, EST NON AFRICAN AMERICAN: 56 mL/min — AB (ref >60)
Glucose, Bld: 207 mg/dL — ABNORMAL HIGH (ref 65–99)
Potassium: 3.6 mmol/L (ref 3.5–5.1)
Sodium: 139 mmol/L (ref 135–145)

## 2017-05-27 LAB — CBC WITH DIFFERENTIAL/PLATELET
BASOS ABS: 0 10*3/uL (ref 0.0–0.1)
BASOS PCT: 0 %
Eosinophils Absolute: 1.2 10*3/uL — ABNORMAL HIGH (ref 0.0–0.7)
Eosinophils Relative: 16 %
HEMATOCRIT: 35 % — AB (ref 39.0–52.0)
Hemoglobin: 12.1 g/dL — ABNORMAL LOW (ref 13.0–17.0)
Lymphocytes Relative: 19 %
Lymphs Abs: 1.4 10*3/uL (ref 0.7–4.0)
MCH: 31.8 pg (ref 26.0–34.0)
MCHC: 34.6 g/dL (ref 30.0–36.0)
MCV: 91.9 fL (ref 78.0–100.0)
MONO ABS: 0.6 10*3/uL (ref 0.1–1.0)
Monocytes Relative: 8 %
NEUTROS ABS: 4.1 10*3/uL (ref 1.7–7.7)
NEUTROS PCT: 57 %
Platelets: 187 10*3/uL (ref 150–400)
RBC: 3.81 MIL/uL — AB (ref 4.22–5.81)
RDW: 13.3 % (ref 11.5–15.5)
WBC: 7.2 10*3/uL (ref 4.0–10.5)

## 2017-05-27 MED ORDER — DOXYCYCLINE HYCLATE 100 MG PO CAPS: 100.0000 mg | ORAL_CAPSULE | Freq: Two times a day (BID) | ORAL | 0 refills | Status: DC

## 2017-05-27 NOTE — ED Provider Notes (Signed)
[facility] DEPT Provider Note   CSN: [id number] Arrival date & time: 05/27/17  1556     History   Chief Complaint Chief Complaint  Patient presents with  . Hypertension  . Insect Bite    HPI Jim [name] is a 83 y.o. male.  83 year old male presents with worsening rash to his right thigh after being bit by a tick about a week ago. Denies any fever or chills or body aches. Blood pressure was elevated today. He does have a history of hypertension has been compliant with his medications. Denies any associated severe headache, chest pain, abdominal discomfort. No numbness or tingling to his extremity. No treatment use prior to arrival.      Past Medical History:  Diagnosis Date  . Cholestatic hepatitis 2014  . COPD (chronic obstructive pulmonary disease) ([place])   . ED (erectile dysfunction)   . Essential hypertension, benign   . Hyperlipidemia   . Personal history of colonic polyps   . Prostate cancer ([place]) 2008   s/p XRT  . Renal mass 08/12/2014  . Syncope 2010   Evaluated by Dr. Caryl Comes - normal LVEF, possibly neurally mediated  . Type 2 diabetes mellitus ([place]) 10/2009    Patient Active Problem List   Diagnosis Date Noted  . Chronic respiratory failure with hypoxia ([place]) 02/25/2016  . PCP NOTES >>>>> 10/29/2015  . Solitary pulmonary nodule 08/16/2015  . Pulmonary infiltrates on CXR 08/03/2015  . Acute on chronic diastolic heart failure ([place]) 08/19/2014  . Choledocholithiasis 08/16/2014  . Abnormal LFTs 08/12/2014  . Renal mass 08/12/2014  . Diabetes ([place]) 08/12/2014  . Cholestatic hepatitis 11/02/2013  . Annual physical exam 07/23/2011  . COPD GOLD I 12/08/2007  . MALIGNANT NEOPLASM OF PROSTATE 11/02/2007  . ERECTILE DYSFUNCTION 09/17/2007  . Hyperlipidemia, unspecified 12/17/2006  . Essential hypertension 12/17/2006    Past Surgical History:  Procedure Laterality Date  . CATARACT EXTRACTION Bilateral   . CHOLECYSTECTOMY N/A 08/22/2014   Procedure:  LAPAROSCOPIC CHOLECYSTECTOMY WITH INTRAOPERATIVE CHOLANGIOGRAM;  Surgeon: Gayland Curry, MD;  Location: [facility];  Service: General;  Laterality: N/A;  . COLONOSCOPY    . ERCP N/A 08/16/2014   Procedure: ENDOSCOPIC RETROGRADE CHOLANGIOPANCREATOGRAPHY (ERCP);  Surgeon: Milus Banister, MD;  Location: [facility];  Service: Endoscopy;  Laterality: N/A;  . EYE SURGERY Left 07/17/2014       Home Medications    Prior to Admission medications   Medication Sig Start Date End Date Taking? Authorizing Provider  albuterol (PROVENTIL) (2.5 MG/3ML) 0.083% nebulizer solution Take 3 mLs (2.5 mg total) by nebulization 4 (four) times daily as needed. 04/02/15  Yes Colon Branch, MD  aspirin 81 MG tablet Take 81 mg by mouth daily.     Yes [provider]  atorvastatin (LIPITOR) 20 MG tablet Take 0.5 tablets (10 mg total) by mouth daily. 04/06/17  Yes Paz, Alda Berthold, MD  BESIVANCE 0.6 % SUSP Place 1 drop into both eyes 4 (four) times daily. Instill 1 drop into both eyes four times daily for 2 days after each monthly eye injection 02/20/17  Yes [provider]  carvedilol (COREG) 25 MG tablet Take 1 tablet (25 mg total) by mouth 2 (two) times daily with a meal. 04/27/17  Yes Paz, Alda Berthold, MD  COMBIVENT RESPIMAT 20-100 MCG/ACT AERS respimat inhale 1 puff by mouth every 6 hours if needed for shortness of breath or wheezing 05/22/17  Yes Tanda Rockers, MD  DULERA 200-5 MCG/ACT AERO inhale 2 puffs by  mouth twice a day 05/22/17  Yes Tanda Rockers, MD  furosemide (LASIX) 40 MG tablet Take 1 tablet (40 mg total) by mouth 2 (two) times daily. 05/07/17  Yes Paz, Alda Berthold, MD  losartan (COZAAR) 100 MG tablet Take 1 tablet (100 mg total) by mouth daily. 05/07/17  Yes Colon Branch, MD  metFORMIN (GLUCOPHAGE) 500 MG tablet Take 2 tablets by mouth every morning and 1 tablet by mouth every evening 04/27/17  Yes Paz, Alda Berthold, MD  Multiple Vitamins-Minerals (PRESERVISION AREDS PO) Take 2 tablets by mouth daily.     Yes [provider]  naproxen sodium (ANAPROX) 220 MG tablet Take 440 mg by mouth 2 (two) times daily with a meal.   Yes [provider]    Family History Family History  Problem Relation Age of Onset  . Cirrhosis Brother   . Dementia Mother   . Colon cancer Neg Hx   . Prostate cancer Neg Hx   . CAD Neg Hx     Social History Social History  Substance Use Topics  . Smoking status: Former Smoker    Packs/day: 2.00    Years: 40.00    Types: Cigarettes    Quit date: 11/24/2005  . Smokeless tobacco: Never Used  . Alcohol use 0.0 oz/week     Comment: Rarely     Allergies   Amlodipine besylate; Cardura [doxazosin mesylate]; Clonidine derivatives; Fluticasone-salmeterol; and Nifedipine   Review of Systems Review of Systems  All other systems reviewed and are negative.    Physical Exam Updated Vital Signs BP (!) 196/102 (BP Location: Left Arm)   Pulse 73   Temp 97.8 F (36.6 C) (Oral)   Resp 18   Ht 1.753 m (5\' 9" )   Wt 73.5 kg (162 lb)   SpO2 94%   BMI 23.92 kg/m   Physical Exam  Constitutional: He is oriented to person, place, and time. He appears well-developed and well-nourished.  Non-toxic appearance. No distress.  HENT:  Head: Normocephalic and atraumatic.  Eyes: Conjunctivae, EOM and lids are normal. Pupils are equal, round, and reactive to light.  Neck: Normal range of motion. Neck supple. No tracheal deviation present. No thyroid mass present.  Cardiovascular: Normal rate, regular rhythm and normal heart sounds.  Exam reveals no gallop.   No murmur heard. Pulmonary/Chest: Effort normal and breath sounds normal. No stridor. No respiratory distress. He has no decreased breath sounds. He has no wheezes. He has no rhonchi. He has no rales.  Abdominal: Soft. Normal appearance and bowel sounds are normal. He exhibits no distension. There is no tenderness. There is no rebound and no CVA tenderness.  Musculoskeletal: Normal range of motion. He exhibits no edema  or tenderness.       Legs: Neurological: He is alert and oriented to person, place, and time. He has normal strength. No cranial nerve deficit or sensory deficit. GCS eye subscore is 4. GCS verbal subscore is 5. GCS motor subscore is 6.  Skin: Skin is warm and dry. Rash noted. Rash is macular.  Psychiatric: He has a normal mood and affect. His speech is normal and behavior is normal.  Nursing note and vitals reviewed.    ED Treatments / Results  Labs (all labs ordered are listed, but only abnormal results are displayed) Labs Reviewed  CBC WITH DIFFERENTIAL/PLATELET  BASIC METABOLIC PANEL    EKG  EKG Interpretation None       Radiology No results found.  Procedures Procedures (including  critical care time)  Medications Ordered in ED Medications - No data to display   Initial Impression / Assessment and Plan / ED Course  I have reviewed the triage vital signs and the nursing notes.  Pertinent labs & imaging results that were available during my care of the patient were reviewed by me and considered in my medical decision making (see chart for details).     Patient be started on doxycycline fo suspected lyme dz Repeat bp noted and pt to f/u w/ his pcp Labs w/o evidence of end organ damage   Final Clinical Impressions(s) / ED Diagnoses   Final diagnoses:  None    New Prescriptions New Prescriptions   No medications on file     Lacretia Leigh, MD 05/27/17 1826

## 2017-05-27 NOTE — ED Triage Notes (Signed)
Pt states he was bit by a tick about a week ago and the bug was removed then but he still has red swelling to his right thigh where it bit.  Pt also states he has a hx of HTN and took his medication this morning but it was elevated when he checked it this afternoon.  Ambulatory.  A&O x4.

## 2017-06-04 ENCOUNTER — Encounter (INDEPENDENT_AMBULATORY_CARE_PROVIDER_SITE_OTHER): Payer: Medicare Other | Admitting: Ophthalmology

## 2017-06-04 DIAGNOSIS — H35033 Hypertensive retinopathy, bilateral: Secondary | ICD-10-CM

## 2017-06-04 DIAGNOSIS — H33303 Unspecified retinal break, bilateral: Secondary | ICD-10-CM | POA: Diagnosis not present

## 2017-06-04 DIAGNOSIS — I10 Essential (primary) hypertension: Secondary | ICD-10-CM | POA: Diagnosis not present

## 2017-06-04 DIAGNOSIS — H353231 Exudative age-related macular degeneration, bilateral, with active choroidal neovascularization: Secondary | ICD-10-CM | POA: Diagnosis not present

## 2017-06-04 DIAGNOSIS — H43813 Vitreous degeneration, bilateral: Secondary | ICD-10-CM | POA: Diagnosis not present

## 2017-07-01 DIAGNOSIS — Z8546 Personal history of malignant neoplasm of prostate: Secondary | ICD-10-CM | POA: Diagnosis not present

## 2017-07-01 LAB — PSA: PSA: 0.23

## 2017-07-07 DIAGNOSIS — Z8546 Personal history of malignant neoplasm of prostate: Secondary | ICD-10-CM | POA: Diagnosis not present

## 2017-07-07 DIAGNOSIS — D49512 Neoplasm of unspecified behavior of left kidney: Secondary | ICD-10-CM | POA: Diagnosis not present

## 2017-07-10 ENCOUNTER — Encounter: Payer: Self-pay | Admitting: Internal Medicine

## 2017-07-16 ENCOUNTER — Encounter (INDEPENDENT_AMBULATORY_CARE_PROVIDER_SITE_OTHER): Payer: Medicare Other | Admitting: Ophthalmology

## 2017-07-16 DIAGNOSIS — I10 Essential (primary) hypertension: Secondary | ICD-10-CM | POA: Diagnosis not present

## 2017-07-16 DIAGNOSIS — H33302 Unspecified retinal break, left eye: Secondary | ICD-10-CM

## 2017-07-16 DIAGNOSIS — H35033 Hypertensive retinopathy, bilateral: Secondary | ICD-10-CM

## 2017-07-16 DIAGNOSIS — H43813 Vitreous degeneration, bilateral: Secondary | ICD-10-CM | POA: Diagnosis not present

## 2017-07-16 DIAGNOSIS — H353231 Exudative age-related macular degeneration, bilateral, with active choroidal neovascularization: Secondary | ICD-10-CM

## 2017-08-12 ENCOUNTER — Encounter: Payer: Self-pay | Admitting: Internal Medicine

## 2017-08-12 ENCOUNTER — Ambulatory Visit (INDEPENDENT_AMBULATORY_CARE_PROVIDER_SITE_OTHER): Payer: Medicare Other | Admitting: Internal Medicine

## 2017-08-12 VITALS — BP 118/60 | HR 66 | Temp 97.8°F | Resp 14 | Ht 70.0 in | Wt 156.5 lb

## 2017-08-12 DIAGNOSIS — E119 Type 2 diabetes mellitus without complications: Secondary | ICD-10-CM | POA: Diagnosis not present

## 2017-08-12 DIAGNOSIS — J9611 Chronic respiratory failure with hypoxia: Secondary | ICD-10-CM | POA: Diagnosis not present

## 2017-08-12 DIAGNOSIS — Z23 Encounter for immunization: Secondary | ICD-10-CM | POA: Diagnosis not present

## 2017-08-12 DIAGNOSIS — I10 Essential (primary) hypertension: Secondary | ICD-10-CM | POA: Diagnosis not present

## 2017-08-12 LAB — CBC WITH DIFFERENTIAL/PLATELET
Basophils Absolute: 0.1 10*3/uL (ref 0.0–0.1)
Basophils Relative: 1.3 % (ref 0.0–3.0)
EOS PCT: 6.7 % — AB (ref 0.0–5.0)
Eosinophils Absolute: 0.5 10*3/uL (ref 0.0–0.7)
HEMATOCRIT: 35.6 % — AB (ref 39.0–52.0)
HEMOGLOBIN: 12.2 g/dL — AB (ref 13.0–17.0)
LYMPHS PCT: 21.4 % (ref 12.0–46.0)
Lymphs Abs: 1.5 10*3/uL (ref 0.7–4.0)
MCHC: 34.3 g/dL (ref 30.0–36.0)
MCV: 94.1 fl (ref 78.0–100.0)
MONOS PCT: 6.9 % (ref 3.0–12.0)
Monocytes Absolute: 0.5 10*3/uL (ref 0.1–1.0)
NEUTROS ABS: 4.6 10*3/uL (ref 1.4–7.7)
Neutrophils Relative %: 63.7 % (ref 43.0–77.0)
PLATELETS: 201 10*3/uL (ref 150.0–400.0)
RBC: 3.78 Mil/uL — ABNORMAL LOW (ref 4.22–5.81)
RDW: 14 % (ref 11.5–15.5)
WBC: 7.2 10*3/uL (ref 4.0–10.5)

## 2017-08-12 LAB — HEMOGLOBIN A1C: Hgb A1c MFr Bld: 5.9 % (ref 4.6–6.5)

## 2017-08-12 MED ORDER — MOMETASONE FURO-FORMOTEROL FUM 200-5 MCG/ACT IN AERO: 2.0000 | INHALATION_SPRAY | Freq: Two times a day (BID) | RESPIRATORY_TRACT | 12 refills | Status: DC

## 2017-08-12 NOTE — Patient Instructions (Signed)
GO TO THE LAB : Get the blood work     GO TO THE FRONT DESK Schedule your next appointment for a  routine checkup in 6 months  

## 2017-08-12 NOTE — Progress Notes (Signed)
Pre visit review using our clinic review tool, if applicable. No additional management support is needed unless otherwise documented below in the visit note. 

## 2017-08-12 NOTE — Progress Notes (Signed)
Subjective:    Patient ID: Jim [name], male    DOB: 22-Jul-1932, 83 y.o.   MRN: [id number]  DOS:  08/12/2017 Type of visit - description : rov Interval history: HTN: Ambulatory BPs 120, 130. Only one reading was elevated ~  150. COPD: Needs a refill on Dulera. DM, no recent a CBGs, on metformin. Due for a A1c Renal mass: Note from urology reviewed.    Review of Systems  Denies chest pain. Able to do all his ADLs without difficulty breathing. No lower extremity edema Occasional cough with clear sputum mostly in the mornings. No hemoptysis  Past Medical History:  Diagnosis Date  . Cholestatic hepatitis 2014  . COPD (chronic obstructive pulmonary disease) (Jim [name])   . ED (erectile dysfunction)   . Essential hypertension, benign   . Hyperlipidemia   . Personal history of colonic polyps   . Prostate cancer (Jim [name]) 2008   s/p XRT  . Renal mass 08/12/2014  . Syncope 2010   Evaluated by Dr. Caryl Comes - normal LVEF, possibly neurally mediated  . Type 2 diabetes mellitus (Jim [name]) 10/2009    Past Surgical History:  Procedure Laterality Date  . CATARACT EXTRACTION Bilateral   . CHOLECYSTECTOMY N/A 08/22/2014   Procedure: LAPAROSCOPIC CHOLECYSTECTOMY WITH INTRAOPERATIVE CHOLANGIOGRAM;  Surgeon: Gayland Curry, MD;  Location: [facility];  Service: General;  Laterality: N/A;  . COLONOSCOPY    . ERCP N/A 08/16/2014   Procedure: ENDOSCOPIC RETROGRADE CHOLANGIOPANCREATOGRAPHY (ERCP);  Surgeon: Milus Banister, MD;  Location: [facility];  Service: Endoscopy;  Laterality: N/A;  . EYE SURGERY Left 07/17/2014    Social History   Social History  . Marital status: Divorced    Spouse name: N/A  . Number of children: 2  . Years of education: N/A   Occupational History  . Retired  Retired   Social History Main Topics  . Smoking status: Former Smoker    Packs/day: 2.00    Years: 40.00    Types: Cigarettes    Quit date: 11/24/2005  . Smokeless tobacco: Never Used  . Alcohol use 0.0 oz/week   Comment: Rarely  . Drug use: No  . Sexual activity: Not on file   Other Topics Concern  . Not on file   Social History Narrative   Independent on ADL    Lives with son and daughter in law   Has Gk and GGk      Allergies as of 08/12/2017      Reactions   Amlodipine Besylate    REACTION: swelling   Cardura [doxazosin Mesylate] Itching   Clonidine Derivatives Itching   Fluticasone-salmeterol Hives, Itching   Nifedipine    Swelling, rash, itching      Medication List       Accurate as of 08/12/17  6:29 PM. Always use your most recent med list.          albuterol (2.5 MG/3ML) 0.083% nebulizer solution Commonly known as:  PROVENTIL Take 3 mLs (2.5 mg total) by nebulization 4 (four) times daily as needed.   aspirin 81 MG tablet Take 81 mg by mouth daily.   atorvastatin 20 MG tablet Commonly known as:  LIPITOR Take 0.5 tablets (10 mg total) by mouth daily.   BESIVANCE 0.6 % Susp Generic drug:  Besifloxacin HCl Place 1 drop into both eyes 4 (four) times daily. Instill 1 drop into both eyes four times daily for 2 days after each monthly eye injection   carvedilol 25 MG tablet Commonly known as:  COREG Take 1 tablet (25 mg total) by mouth 2 (two) times daily with a meal.   COMBIVENT RESPIMAT 20-100 MCG/ACT Aers respimat Generic drug:  Ipratropium-Albuterol inhale 1 puff by mouth every 6 hours if needed for shortness of breath or wheezing   furosemide 40 MG tablet Commonly known as:  LASIX Take 1 tablet (40 mg total) by mouth 2 (two) times daily.   losartan 100 MG tablet Commonly known as:  COZAAR Take 1 tablet (100 mg total) by mouth daily.   metFORMIN 500 MG tablet Commonly known as:  GLUCOPHAGE Take 2 tablets by mouth every morning and 1 tablet by mouth every evening   mometasone-formoterol 200-5 MCG/ACT Aero Commonly known as:  DULERA Inhale 2 puffs into the lungs 2 (two) times daily.   naproxen sodium 220 MG tablet Commonly known as:  ANAPROX Take 440  mg by mouth 2 (two) times daily with a meal.   PRESERVISION AREDS PO Take 2 tablets by mouth daily.            Discharge Care Instructions        Start     Ordered   08/12/17 0000  CBC w/Diff     08/12/17 1114   08/12/17 0000  Hemoglobin A1c     08/12/17 1114   08/12/17 0000  Flu vaccine HIGH DOSE PF (Fluzone High dose)     08/12/17 1114   08/12/17 0000  mometasone-formoterol (DULERA) 200-5 MCG/ACT AERO  2 times daily     08/12/17 1115         Objective:   Physical Exam BP 118/60 (BP Location: Right Arm, Patient Position: Sitting, Cuff Size: Small)   Pulse 66   Temp 97.8 F (36.6 C) (Oral)   Resp 14   Ht 5\' 10"  (1.778 m)   Wt 156 lb 8 oz (71 kg)   SpO2 94%   BMI 22.46 kg/m  General:   Well developed, well nourished . NAD.  HEENT:  Normocephalic . Face symmetric, atraumatic Lungs:  Decreased breath sounds otherwise clear B Normal respiratory effort, no intercostal retractions, no accessory muscle use. Heart: Distant heart sounds. RRR,  no murmur.  No pretibial edema bilaterally  Skin: Not pale. Not jaundice Neurologic:  alert & oriented X3.  Speech normal, gait appropriate for age and unassisted Psych--  Cognition and judgment appear intact.  Cooperative with normal attention span and concentration.  Behavior appropriate. No anxious or depressed appearing.      Assessment & Plan:       Assessment   DM HTN Hyperlipidemia COPD, Respiratory failure chronic HR CT chest   >>> 01/2016 , saw Dr Melvyn Novas; no f/u needed Renal mass 9- 2015, dr Karsten Ro, sees urology next visit due  11-2016 H/o Prostate cancer 2008 XRT Syncope 2010 saw Dr. Caryl Comes H/o Cholestatic hepatitis- s/p ERCP 2014,  cholecystectomy 2015  PLAN: DM: On metformin, check A1c. Last BMP satisfactory HTN: Well-controlled on carvedilol, Lasix, losartan. Last BMP satisfactory. Check a CBC. COPD, chronic respiratory failure. ONO RA 12/2012:  Low sat 69%, but only 39min less than 88%. Pulmonary Rx  ONO RA on 02/25/2016 apparently never done. The patient has an O2 concentrator at home hardly ever uses. Recommend consistent use.Marland Kitchen Refill Dulera.   Renal mass: Saw urology [phone], mass increase in size for 1 or 2 mm, they recommend ongoing observation. Takes naproxen but only rarely Needs a disability parking: Signed. RTC 6 months

## 2017-08-12 NOTE — Assessment & Plan Note (Signed)
DM: On metformin, check A1c. Last BMP satisfactory HTN: Well-controlled on carvedilol, Lasix, losartan. Last BMP satisfactory. Check a CBC. COPD, chronic respiratory failure. ONO RA 12/2012:  Low sat 69%, but only 51min less than 88%. Pulmonary Rx ONO RA on 02/25/2016 apparently never done. The patient has an O2 concentrator at home hardly ever uses. Recommend consistent use.Marland Kitchen Refill Dulera.   Renal mass: Saw urology [phone], mass increase in size for 1 or 2 mm, they recommend ongoing observation. Takes naproxen but only rarely Needs a disability parking: Signed. RTC 6 months

## 2017-08-13 ENCOUNTER — Telehealth: Payer: Self-pay

## 2017-08-13 NOTE — Telephone Encounter (Signed)
Dulera not covered, Pt must try and fail Advair and Breo Ellipta first. Please advise.

## 2017-08-14 NOTE — Telephone Encounter (Signed)
It is not what I had in mind for the pt but he will have to try. Call Breo Ellipta 200/25 1 puff qd

## 2017-08-14 NOTE — Telephone Encounter (Signed)
Pt actually allergic to Fluticasone in Breo Ellipta and Advair- will initiate PA for [facility].    PA initiated via Covermymeds; KEY: [id number]. Awaiting determination .

## 2017-08-17 NOTE — Telephone Encounter (Signed)
PA approved 11/22/2016 through 11/23/2017.

## 2017-08-22 ENCOUNTER — Other Ambulatory Visit: Payer: Self-pay | Admitting: Internal Medicine

## 2017-08-27 ENCOUNTER — Encounter (INDEPENDENT_AMBULATORY_CARE_PROVIDER_SITE_OTHER): Payer: Medicare Other | Admitting: Ophthalmology

## 2017-08-27 DIAGNOSIS — H43813 Vitreous degeneration, bilateral: Secondary | ICD-10-CM

## 2017-08-27 DIAGNOSIS — H33302 Unspecified retinal break, left eye: Secondary | ICD-10-CM | POA: Diagnosis not present

## 2017-08-27 DIAGNOSIS — H35033 Hypertensive retinopathy, bilateral: Secondary | ICD-10-CM

## 2017-08-27 DIAGNOSIS — I10 Essential (primary) hypertension: Secondary | ICD-10-CM | POA: Diagnosis not present

## 2017-08-27 DIAGNOSIS — H353231 Exudative age-related macular degeneration, bilateral, with active choroidal neovascularization: Secondary | ICD-10-CM | POA: Diagnosis not present

## 2017-09-01 ENCOUNTER — Other Ambulatory Visit: Payer: Self-pay | Admitting: Internal Medicine

## 2017-09-30 ENCOUNTER — Other Ambulatory Visit: Payer: Self-pay | Admitting: Internal Medicine

## 2017-10-08 ENCOUNTER — Encounter (INDEPENDENT_AMBULATORY_CARE_PROVIDER_SITE_OTHER): Payer: Medicare Other | Admitting: Ophthalmology

## 2017-10-08 DIAGNOSIS — H353231 Exudative age-related macular degeneration, bilateral, with active choroidal neovascularization: Secondary | ICD-10-CM

## 2017-10-08 DIAGNOSIS — I10 Essential (primary) hypertension: Secondary | ICD-10-CM | POA: Diagnosis not present

## 2017-10-08 DIAGNOSIS — H43813 Vitreous degeneration, bilateral: Secondary | ICD-10-CM | POA: Diagnosis not present

## 2017-10-08 DIAGNOSIS — H33302 Unspecified retinal break, left eye: Secondary | ICD-10-CM | POA: Diagnosis not present

## 2017-10-08 DIAGNOSIS — H35033 Hypertensive retinopathy, bilateral: Secondary | ICD-10-CM

## 2017-10-21 ENCOUNTER — Other Ambulatory Visit: Payer: Self-pay | Admitting: Internal Medicine

## 2017-11-12 ENCOUNTER — Encounter (INDEPENDENT_AMBULATORY_CARE_PROVIDER_SITE_OTHER): Payer: Medicare Other | Admitting: Ophthalmology

## 2017-11-12 DIAGNOSIS — H43813 Vitreous degeneration, bilateral: Secondary | ICD-10-CM

## 2017-11-12 DIAGNOSIS — I10 Essential (primary) hypertension: Secondary | ICD-10-CM

## 2017-11-12 DIAGNOSIS — H353231 Exudative age-related macular degeneration, bilateral, with active choroidal neovascularization: Secondary | ICD-10-CM

## 2017-11-12 DIAGNOSIS — H35033 Hypertensive retinopathy, bilateral: Secondary | ICD-10-CM | POA: Diagnosis not present

## 2017-11-12 DIAGNOSIS — H33302 Unspecified retinal break, left eye: Secondary | ICD-10-CM

## 2017-12-24 ENCOUNTER — Encounter (INDEPENDENT_AMBULATORY_CARE_PROVIDER_SITE_OTHER): Payer: Medicare Other | Admitting: Ophthalmology

## 2017-12-24 DIAGNOSIS — H353231 Exudative age-related macular degeneration, bilateral, with active choroidal neovascularization: Secondary | ICD-10-CM

## 2017-12-24 DIAGNOSIS — I10 Essential (primary) hypertension: Secondary | ICD-10-CM | POA: Diagnosis not present

## 2017-12-24 DIAGNOSIS — H33302 Unspecified retinal break, left eye: Secondary | ICD-10-CM | POA: Diagnosis not present

## 2017-12-24 DIAGNOSIS — H35033 Hypertensive retinopathy, bilateral: Secondary | ICD-10-CM | POA: Diagnosis not present

## 2017-12-24 DIAGNOSIS — H43813 Vitreous degeneration, bilateral: Secondary | ICD-10-CM

## 2017-12-24 LAB — HM DIABETES EYE EXAM

## 2017-12-29 ENCOUNTER — Encounter: Payer: Self-pay | Admitting: Internal Medicine

## 2018-01-12 DIAGNOSIS — D49512 Neoplasm of unspecified behavior of left kidney: Secondary | ICD-10-CM | POA: Diagnosis not present

## 2018-01-28 ENCOUNTER — Encounter (INDEPENDENT_AMBULATORY_CARE_PROVIDER_SITE_OTHER): Payer: Medicare Other | Admitting: Ophthalmology

## 2018-01-28 DIAGNOSIS — H353231 Exudative age-related macular degeneration, bilateral, with active choroidal neovascularization: Secondary | ICD-10-CM | POA: Diagnosis not present

## 2018-01-28 DIAGNOSIS — H35033 Hypertensive retinopathy, bilateral: Secondary | ICD-10-CM

## 2018-01-28 DIAGNOSIS — H43813 Vitreous degeneration, bilateral: Secondary | ICD-10-CM | POA: Diagnosis not present

## 2018-01-28 DIAGNOSIS — I10 Essential (primary) hypertension: Secondary | ICD-10-CM | POA: Diagnosis not present

## 2018-01-28 DIAGNOSIS — H33302 Unspecified retinal break, left eye: Secondary | ICD-10-CM | POA: Diagnosis not present

## 2018-02-09 ENCOUNTER — Ambulatory Visit: Payer: Medicare Other | Admitting: Internal Medicine

## 2018-02-10 ENCOUNTER — Ambulatory Visit (INDEPENDENT_AMBULATORY_CARE_PROVIDER_SITE_OTHER): Payer: Medicare Other | Admitting: Internal Medicine

## 2018-02-10 ENCOUNTER — Encounter: Payer: Self-pay | Admitting: Internal Medicine

## 2018-02-10 VITALS — BP 132/68 | HR 69 | Temp 97.9°F | Resp 14 | Ht 70.0 in | Wt 164.2 lb

## 2018-02-10 DIAGNOSIS — I10 Essential (primary) hypertension: Secondary | ICD-10-CM

## 2018-02-10 DIAGNOSIS — E119 Type 2 diabetes mellitus without complications: Secondary | ICD-10-CM | POA: Diagnosis not present

## 2018-02-10 DIAGNOSIS — J9611 Chronic respiratory failure with hypoxia: Secondary | ICD-10-CM | POA: Diagnosis not present

## 2018-02-10 DIAGNOSIS — E785 Hyperlipidemia, unspecified: Secondary | ICD-10-CM

## 2018-02-10 LAB — HEMOGLOBIN A1C: HEMOGLOBIN A1C: 6.1 % (ref 4.6–6.5)

## 2018-02-10 NOTE — Progress Notes (Signed)
Subjective:    Patient ID: Jim [name], male    DOB: 01/09/1932, 84 y.o.   MRN: [id number]  DOS:  02/10/2018 Type of visit - description : rov Interval history: COPD: Good compliance of medication but uses oxygen inconsistently, had a head cold last week, he is back to baseline. DM: Good med compliance, no ambulatory CBGs HTN: Good med compliance, ambulatory BPs when checked are usually normal. High cholesterol: Due for labs  Review of Systems Denies nausea, vomiting, diarrhea.  No blood in the stools. Occasional cough and a small amount of sputum production, mostly in the morning.  No hemoptysis. Denies gross hematuria, difficulty urinating or dysuria  Past Medical History:  Diagnosis Date  . Cholestatic hepatitis 2014  . COPD (chronic obstructive pulmonary disease) ([place])   . ED (erectile dysfunction)   . Essential hypertension, benign   . Hyperlipidemia   . Personal history of colonic polyps   . Prostate cancer ([place]) 2008   s/p XRT  . Renal mass 08/12/2014  . Syncope 2010   Evaluated by Dr. Caryl Comes - normal LVEF, possibly neurally mediated  . Type 2 diabetes mellitus ([place]) 10/2009    Past Surgical History:  Procedure Laterality Date  . CATARACT EXTRACTION Bilateral   . CHOLECYSTECTOMY N/A 08/22/2014   Procedure: LAPAROSCOPIC CHOLECYSTECTOMY WITH INTRAOPERATIVE CHOLANGIOGRAM;  Surgeon: Gayland Curry, MD;  Location: [facility];  Service: General;  Laterality: N/A;  . COLONOSCOPY    . ERCP N/A 08/16/2014   Procedure: ENDOSCOPIC RETROGRADE CHOLANGIOPANCREATOGRAPHY (ERCP);  Surgeon: Milus Banister, MD;  Location: [facility];  Service: Endoscopy;  Laterality: N/A;  . EYE SURGERY Left 07/17/2014    Social History   Socioeconomic History  . Marital status: Divorced    Spouse name: Not on file  . Number of children: 2  . Years of education: Not on file  . Highest education level: Not on file  Social Needs  . Financial resource strain: Not on file  . Food insecurity - worry:  Not on file  . Food insecurity - inability: Not on file  . Transportation needs - medical: Not on file  . Transportation needs - non-medical: Not on file  Occupational History  . Occupation: Retired     Fish farm manager: RETIRED  Tobacco Use  . Smoking status: Former Smoker    Packs/day: 2.00    Years: 40.00    Pack years: 80.00    Types: Cigarettes    Last attempt to quit: 11/24/2005    Years since quitting: 12.2  . Smokeless tobacco: Never Used  Substance and Sexual Activity  . Alcohol use: Yes    Alcohol/week: 0.0 oz    Comment: Rarely  . Drug use: No  . Sexual activity: Not on file  Other Topics Concern  . Not on file  Social History Narrative   Independent on ADL    Lives with son and daughter in law   Has Gk and GGk      Allergies as of 02/10/2018      Reactions   Amlodipine Besylate    REACTION: swelling   Cardura [doxazosin Mesylate] Itching   Clonidine Derivatives Itching   Fluticasone-salmeterol Hives, Itching   Nifedipine    Swelling, rash, itching      Medication List        Accurate as of 02/10/18  3:15 PM. Always use your most recent med list.          albuterol (2.5 MG/3ML) 0.083% nebulizer solution Commonly known  as:  PROVENTIL Take 3 mLs (2.5 mg total) by nebulization 4 (four) times daily as needed.   aspirin 81 MG tablet Take 81 mg by mouth daily.   atorvastatin 20 MG tablet Commonly known as:  LIPITOR Take 0.5 tablets (10 mg total) daily by mouth.   BESIVANCE 0.6 % Susp Generic drug:  Besifloxacin HCl Place 1 drop into both eyes 4 (four) times daily. Instill 1 drop into both eyes four times daily for 2 days after each monthly eye injection   carvedilol 25 MG tablet Commonly known as:  COREG take 1 tablet by mouth twice a day   COMBIVENT RESPIMAT 20-100 MCG/ACT Aers respimat Generic drug:  Ipratropium-Albuterol inhale 1 puff by mouth every 6 hours if needed for shortness of breath or wheezing   DULERA 200-5 MCG/ACT Aero Generic drug:   mometasone-formoterol Inhale 2 puffs into the lungs 2 (two) times daily.   furosemide 40 MG tablet Commonly known as:  LASIX Take 1 tablet (40 mg total) by mouth 2 (two) times daily.   losartan 100 MG tablet Commonly known as:  COZAAR Take 1 tablet (100 mg total) by mouth daily.   metFORMIN 500 MG tablet Commonly known as:  GLUCOPHAGE take 2 tablets by mouth every morning and 1 tablet by mouth every evening   naproxen sodium 220 MG tablet Commonly known as:  ALEVE Take 440 mg by mouth 2 (two) times daily with a meal.   PRESERVISION AREDS PO Take 2 tablets by mouth daily.          Objective:   Physical Exam BP 132/68 (BP Location: Left Arm, Patient Position: Sitting, Cuff Size: Small)   Pulse 69   Temp 97.9 F (36.6 C) (Oral)   Resp 14   Ht 5\' 10"  (1.778 m)   Wt 164 lb 4 oz (74.5 kg)   SpO2 97%   BMI 23.57 kg/m  General:   Well developed, well nourished . NAD.  HEENT:  Normocephalic . Face symmetric, atraumatic Lungs:  Decreased breath sounds otherwise clear Normal respiratory effort, no intercostal retractions, no accessory muscle use. Heart: RRR,  no murmur.  No pretibial edema bilaterally  Skin: Not pale. Not jaundice Neurologic:  alert & oriented X3.  Speech normal, gait appropriate for age and unassisted Psych--  Cognition and judgment appear intact.  Cooperative with normal attention span and concentration.  Behavior appropriate. No anxious or depressed appearing.      Assessment & Plan:    Assessment   DM HTN Hyperlipidemia COPD, Respiratory failure chronic HR CT chest   >>> 01/2016 , saw Dr Melvyn Novas; no f/u needed Renal mass 9- 2015, dr Karsten Ro, sees urology next visit due  11-2016 H/o Prostate cancer 2008 XRT Syncope 2010 saw Dr. Caryl Comes H/o Cholestatic hepatitis- s/p ERCP 2014,  cholecystectomy 2015  PLAN: DM: Last A1c 5.9, currently on metformin, check a A1c HTN: Seems well controlled on carvedilol, Lasix, losartan.  Check a CMP High  cholesterol: On Lipitor.  Check a FLP COPD, chronic respiratory failure: Uses O2 sat inconsistently, recommend to use it at least every night.  O2 sat today without supplementation 97%.  Able to do all his ADLs without short of breath. RTC 4-5 months.

## 2018-02-10 NOTE — Assessment & Plan Note (Signed)
DM: Last A1c 5.9, currently on metformin, check a A1c HTN: Seems well controlled on carvedilol, Lasix, losartan.  Check a CMP High cholesterol: On Lipitor.  Check a FLP COPD, chronic respiratory failure: Uses O2 sat inconsistently, recommend to use it at least every night.  O2 sat today without supplementation 97%.  Able to do all his ADLs without short of breath. RTC 4-5 months.

## 2018-02-10 NOTE — Progress Notes (Signed)
Pre visit review using our clinic review tool, if applicable. No additional management support is needed unless otherwise documented below in the visit note. 

## 2018-02-10 NOTE — Patient Instructions (Signed)
GO TO THE LAB : Get the blood work     GO TO THE FRONT DESK Schedule your next appointment for a  checkup in 4-5 months   

## 2018-02-11 LAB — COMPREHENSIVE METABOLIC PANEL
ALBUMIN: 4.3 g/dL (ref 3.5–5.2)
ALT: 16 U/L (ref 0–53)
AST: 16 U/L (ref 0–37)
Alkaline Phosphatase: 72 U/L (ref 39–117)
BUN: 16 mg/dL (ref 6–23)
CHLORIDE: 103 meq/L (ref 96–112)
CO2: 29 meq/L (ref 19–32)
CREATININE: 1.26 mg/dL (ref 0.40–1.50)
Calcium: 9.4 mg/dL (ref 8.4–10.5)
GFR: 58.01 mL/min — ABNORMAL LOW (ref >60.00)
Glucose, Bld: 119 mg/dL — ABNORMAL HIGH (ref 70–99)
POTASSIUM: 5 meq/L (ref 3.5–5.1)
SODIUM: 141 meq/L (ref 135–145)
Total Bilirubin: 0.4 mg/dL (ref 0.2–1.2)
Total Protein: 7.2 g/dL (ref 6.0–8.3)

## 2018-02-11 LAB — LIPID PANEL
CHOL/HDL RATIO: 3
CHOLESTEROL: 146 mg/dL (ref 0–200)
HDL: 42.2 mg/dL (ref >39.00)
LDL CALC: 76 mg/dL (ref 0–99)
NonHDL: 104.08
TRIGLYCERIDES: 139 mg/dL (ref 0.0–149.0)
VLDL: 27.8 mg/dL (ref 0.0–40.0)

## 2018-03-12 ENCOUNTER — Encounter (INDEPENDENT_AMBULATORY_CARE_PROVIDER_SITE_OTHER): Payer: Medicare Other | Admitting: Ophthalmology

## 2018-03-12 DIAGNOSIS — H43813 Vitreous degeneration, bilateral: Secondary | ICD-10-CM | POA: Diagnosis not present

## 2018-03-12 DIAGNOSIS — H353231 Exudative age-related macular degeneration, bilateral, with active choroidal neovascularization: Secondary | ICD-10-CM

## 2018-03-12 DIAGNOSIS — I10 Essential (primary) hypertension: Secondary | ICD-10-CM | POA: Diagnosis not present

## 2018-03-12 DIAGNOSIS — H35033 Hypertensive retinopathy, bilateral: Secondary | ICD-10-CM | POA: Diagnosis not present

## 2018-03-16 ENCOUNTER — Telehealth (INDEPENDENT_AMBULATORY_CARE_PROVIDER_SITE_OTHER): Payer: Self-pay

## 2018-03-16 MED ORDER — BESIVANCE 0.6 % OP SUSP: OPHTHALMIC | 5 refills | Status: DC

## 2018-03-16 NOTE — Telephone Encounter (Signed)
[facility] faxed refill request for Besivance; Refill authorization sent;   Catha Brow, COA

## 2018-03-19 ENCOUNTER — Other Ambulatory Visit: Payer: Self-pay | Admitting: Internal Medicine

## 2018-03-28 ENCOUNTER — Other Ambulatory Visit: Payer: Self-pay | Admitting: Internal Medicine

## 2018-04-15 ENCOUNTER — Other Ambulatory Visit: Payer: Self-pay | Admitting: Internal Medicine

## 2018-04-23 ENCOUNTER — Encounter (INDEPENDENT_AMBULATORY_CARE_PROVIDER_SITE_OTHER): Payer: Medicare Other | Admitting: Ophthalmology

## 2018-04-23 DIAGNOSIS — H43813 Vitreous degeneration, bilateral: Secondary | ICD-10-CM | POA: Diagnosis not present

## 2018-04-23 DIAGNOSIS — H35033 Hypertensive retinopathy, bilateral: Secondary | ICD-10-CM

## 2018-04-23 DIAGNOSIS — I10 Essential (primary) hypertension: Secondary | ICD-10-CM | POA: Diagnosis not present

## 2018-04-23 DIAGNOSIS — H353231 Exudative age-related macular degeneration, bilateral, with active choroidal neovascularization: Secondary | ICD-10-CM | POA: Diagnosis not present

## 2018-04-23 DIAGNOSIS — H33302 Unspecified retinal break, left eye: Secondary | ICD-10-CM

## 2018-04-25 ENCOUNTER — Other Ambulatory Visit: Payer: Self-pay | Admitting: Internal Medicine

## 2018-06-04 ENCOUNTER — Encounter (INDEPENDENT_AMBULATORY_CARE_PROVIDER_SITE_OTHER): Payer: Medicare Other | Admitting: Ophthalmology

## 2018-06-04 DIAGNOSIS — H35033 Hypertensive retinopathy, bilateral: Secondary | ICD-10-CM

## 2018-06-04 DIAGNOSIS — H43813 Vitreous degeneration, bilateral: Secondary | ICD-10-CM

## 2018-06-04 DIAGNOSIS — I10 Essential (primary) hypertension: Secondary | ICD-10-CM

## 2018-06-04 DIAGNOSIS — H353231 Exudative age-related macular degeneration, bilateral, with active choroidal neovascularization: Secondary | ICD-10-CM

## 2018-06-09 ENCOUNTER — Emergency Department (HOSPITAL_COMMUNITY): Payer: Medicare Other

## 2018-06-09 ENCOUNTER — Encounter (HOSPITAL_COMMUNITY): Payer: Self-pay

## 2018-06-09 ENCOUNTER — Emergency Department (HOSPITAL_COMMUNITY)
Admission: EM | Admit: 2018-06-09 | Discharge: 2018-06-10 | Disposition: A | Discharge status: Home / Self Care | Payer: Medicare Other | Attending: Emergency Medicine | Admitting: Emergency Medicine

## 2018-06-09 ENCOUNTER — Other Ambulatory Visit: Payer: Self-pay

## 2018-06-09 DIAGNOSIS — E119 Type 2 diabetes mellitus without complications: Secondary | ICD-10-CM | POA: Insufficient documentation

## 2018-06-09 DIAGNOSIS — S59901A Unspecified injury of right elbow, initial encounter: Secondary | ICD-10-CM | POA: Diagnosis present

## 2018-06-09 DIAGNOSIS — Y92009 Unspecified place in unspecified non-institutional (private) residence as the place of occurrence of the external cause: Secondary | ICD-10-CM | POA: Insufficient documentation

## 2018-06-09 DIAGNOSIS — Y999 Unspecified external cause status: Secondary | ICD-10-CM | POA: Diagnosis not present

## 2018-06-09 DIAGNOSIS — I5032 Chronic diastolic (congestive) heart failure: Secondary | ICD-10-CM | POA: Diagnosis not present

## 2018-06-09 DIAGNOSIS — S51011A Laceration without foreign body of right elbow, initial encounter: Secondary | ICD-10-CM | POA: Diagnosis not present

## 2018-06-09 DIAGNOSIS — Z7984 Long term (current) use of oral hypoglycemic drugs: Secondary | ICD-10-CM | POA: Insufficient documentation

## 2018-06-09 DIAGNOSIS — J449 Chronic obstructive pulmonary disease, unspecified: Secondary | ICD-10-CM | POA: Diagnosis not present

## 2018-06-09 DIAGNOSIS — Z7982 Long term (current) use of aspirin: Secondary | ICD-10-CM | POA: Insufficient documentation

## 2018-06-09 DIAGNOSIS — Y9384 Activity, sleeping: Secondary | ICD-10-CM | POA: Diagnosis not present

## 2018-06-09 DIAGNOSIS — I110 Hypertensive heart disease with heart failure: Secondary | ICD-10-CM | POA: Diagnosis not present

## 2018-06-09 DIAGNOSIS — S51019A Laceration without foreign body of unspecified elbow, initial encounter: Secondary | ICD-10-CM

## 2018-06-09 DIAGNOSIS — Z87891 Personal history of nicotine dependence: Secondary | ICD-10-CM | POA: Diagnosis not present

## 2018-06-09 DIAGNOSIS — Z23 Encounter for immunization: Secondary | ICD-10-CM | POA: Insufficient documentation

## 2018-06-09 DIAGNOSIS — W08XXXA Fall from other furniture, initial encounter: Secondary | ICD-10-CM | POA: Diagnosis not present

## 2018-06-09 DIAGNOSIS — Z79899 Other long term (current) drug therapy: Secondary | ICD-10-CM | POA: Insufficient documentation

## 2018-06-09 MED ORDER — CARVEDILOL 25 MG PO TABS
25.0000 mg | ORAL_TABLET | Freq: Two times a day (BID) | ORAL | Status: DC
  Filled 2018-06-09: qty 1

## 2018-06-09 MED ORDER — TETANUS-DIPHTH-ACELL PERTUSSIS 5-2.5-18.5 LF-MCG/0.5 IM SUSP
0.5000 mL | Freq: Once | INTRAMUSCULAR | Status: AC
Start: 2018-06-09 — End: 2018-06-10
  Administered 2018-06-10: 0.5 mL via INTRAMUSCULAR
  Filled 2018-06-09: qty 0.5

## 2018-06-09 NOTE — ED Triage Notes (Addendum)
PT presents to ED from home after a fall off the couch. Pt reports that last night he rolled off the couch onto the floor and has a skin tear on his R arm. No other injuries. Pt takes daily ASA, does not know if he takes thinners.

## 2018-06-09 NOTE — ED Provider Notes (Signed)
[facility] DEPT Provider Note   CSN: [id number] Arrival date & time: 06/09/18  1843     History   Chief Complaint Chief Complaint  Patient presents with  . Fall  . Arm Injury    HPI Jim [name] is a 84 y.o. male.  Patient presents with right elbow injury after rolling off the couch last night while he was sleeping.  States he sustained a skin tear to his right elbow which bled extensively at the time.  He put on an unknown topical powder from a first-aid kit and states he was doing well today but wanted to get the wound checked out tonight.  It is no longer bleeding.  He takes aspirin but no other blood thinners.  He denies hitting his head or losing consciousness.  No other injury.  No neck or back pain.  No chest pain or shortness of breath.  Unknown last tetanus vaccination.  He does take a baby aspirin per day.  Denies any weakness, numbness or tingling in his arm.  No chest pain or shortness of breath.  The history is provided by the patient.  Fall  Pertinent negatives include no chest pain, no abdominal pain, no headaches and no shortness of breath.  Arm Injury      Past Medical History:  Diagnosis Date  . Cholestatic hepatitis 2014  . COPD (chronic obstructive pulmonary disease) ([place])   . ED (erectile dysfunction)   . Essential hypertension, benign   . Hyperlipidemia   . Personal history of colonic polyps   . Prostate cancer ([place]) 2008   s/p XRT  . Renal mass 08/12/2014  . Syncope 2010   Evaluated by Dr. Caryl Comes - normal LVEF, possibly neurally mediated  . Type 2 diabetes mellitus ([place]) 10/2009    Patient Active Problem List   Diagnosis Date Noted  . Chronic respiratory failure with hypoxia ([place]) 02/25/2016  . PCP NOTES >>>>> 10/29/2015  . Solitary pulmonary nodule 08/16/2015  . Acute on chronic diastolic heart failure ([place]) 08/19/2014  . Choledocholithiasis 08/16/2014  . Abnormal LFTs 08/12/2014  . Renal mass 08/12/2014    . Diabetes ([place]) 08/12/2014  . Cholestatic hepatitis 11/02/2013  . Annual physical exam 07/23/2011  . COPD GOLD I 12/08/2007  . MALIGNANT NEOPLASM OF PROSTATE 11/02/2007  . ERECTILE DYSFUNCTION 09/17/2007  . Hyperlipidemia, unspecified 12/17/2006  . Essential hypertension 12/17/2006    Past Surgical History:  Procedure Laterality Date  . CATARACT EXTRACTION Bilateral   . CHOLECYSTECTOMY N/A 08/22/2014   Procedure: LAPAROSCOPIC CHOLECYSTECTOMY WITH INTRAOPERATIVE CHOLANGIOGRAM;  Surgeon: Gayland Curry, MD;  Location: [facility] ORS;  Service: General;  Laterality: N/A;  . COLONOSCOPY    . ERCP N/A 08/16/2014   Procedure: ENDOSCOPIC RETROGRADE CHOLANGIOPANCREATOGRAPHY (ERCP);  Surgeon: Milus Banister, MD;  Location: [facility];  Service: Endoscopy;  Laterality: N/A;  . EYE SURGERY Left 07/17/2014        Home Medications    Prior to Admission medications   Medication Sig Start Date End Date Taking? Authorizing Provider  albuterol (PROVENTIL) (2.5 MG/3ML) 0.083% nebulizer solution Take 3 mLs (2.5 mg total) by nebulization 4 (four) times daily as needed. 04/02/15   Colon Branch, MD  aspirin 81 MG tablet Take 81 mg by mouth daily.      [provider]  atorvastatin (LIPITOR) 20 MG tablet Take 0.5 tablets (10 mg total) by mouth daily. 03/29/18   Colon Branch, MD  BESIVANCE 0.6 % SUSP Instill 1 drop into  both eyes four times daily for 2 days after each monthly eye injection 03/16/18   Bernarda Caffey, MD  carvedilol (COREG) 25 MG tablet Take 1 tablet (25 mg total) by mouth 2 (two) times daily with a meal. 04/16/18   Colon Branch, MD  COMBIVENT RESPIMAT 20-100 MCG/ACT AERS respimat inhale 1 puff by mouth every 6 hours if needed for shortness of breath or wheezing 05/22/17   Tanda Rockers, MD  furosemide (LASIX) 40 MG tablet Take 1 tablet (40 mg total) by mouth 2 (two) times daily. 03/19/18   Colon Branch, MD  losartan (COZAAR) 100 MG tablet Take 1 tablet (100 mg total) by mouth daily. 03/19/18   Colon Branch, MD  metFORMIN (GLUCOPHAGE) 500 MG tablet TAKE 2 TABLETS BY MOUTH EVERY MORNING AND 1 TABLET BY MOUTH EVERY EVENING 04/26/18   Paz, Alda Berthold, MD  mometasone-formoterol [facility]) 200-5 MCG/ACT AERO Inhale 2 puffs into the lungs 2 (two) times daily. 08/14/17   Colon Branch, MD  Multiple Vitamins-Minerals (PRESERVISION AREDS PO) Take 2 tablets by mouth daily.      [provider]  naproxen sodium (ANAPROX) 220 MG tablet Take 440 mg by mouth 2 (two) times daily with a meal.    [provider]    Family History Family History  Problem Relation Age of Onset  . Cirrhosis Brother   . Dementia Mother   . Colon cancer Neg Hx   . Prostate cancer Neg Hx   . CAD Neg Hx     Social History Social History   Tobacco Use  . Smoking status: Former Smoker    Packs/day: 2.00    Years: 40.00    Pack years: 80.00    Types: Cigarettes    Last attempt to quit: 11/24/2005    Years since quitting: 12.5  . Smokeless tobacco: Never Used  Substance Use Topics  . Alcohol use: Yes    Comment: Rarely  . Drug use: No     Allergies   Amlodipine besylate; Cardura [doxazosin mesylate]; Clonidine derivatives; Fluticasone-salmeterol; and Nifedipine   Review of Systems Review of Systems  Constitutional: Negative for activity change, appetite change and fever.  HENT: Negative for congestion.   Eyes: Negative for visual disturbance.  Respiratory: Negative for cough, chest tightness and shortness of breath.   Cardiovascular: Negative for chest pain.  Gastrointestinal: Negative for abdominal pain, nausea and vomiting.  Genitourinary: Negative for dysuria, hematuria and urgency.  Musculoskeletal: Negative for neck pain.  Skin: Positive for wound.  Neurological: Negative for dizziness, weakness and headaches.    all other systems are negative except as noted in the HPI and PMH.    Physical Exam Updated Vital Signs BP (!) 210/103 (BP Location: Left Arm)   Pulse 61   Temp 98.3 F (36.8 C)  (Oral)   Resp 14   Ht '5\' 10"'$  (1.778 m)   Wt 73.9 kg (163 lb)   SpO2 98%   BMI 23.39 kg/m   Physical Exam  Constitutional: He is oriented to person, place, and time. He appears well-developed and well-nourished. No distress.  HENT:  Head: Normocephalic and atraumatic.  Mouth/Throat: Oropharynx is clear and moist. No oropharyngeal exudate.  Eyes: Pupils are equal, round, and reactive to light. Conjunctivae and EOM are normal.  Neck: Normal range of motion. Neck supple.  No C-spine tenderness no T or L-spine tenderness  Cardiovascular: Normal rate, regular rhythm, normal heart sounds and intact distal pulses.  No murmur heard.  Pulmonary/Chest: Effort normal and breath sounds normal. No respiratory distress.  Abdominal: Soft. There is no tenderness. There is no rebound and no guarding.  Musculoskeletal: Normal range of motion. He exhibits tenderness. He exhibits no edema.  Skin tear to right lateral elbow without bony tenderness.  Flexion and extension are intact.  Bleeding is controlled.  Intact radial pulse and grip strength  Neurological: He is alert and oriented to person, place, and time. No cranial nerve deficit. He exhibits normal muscle tone. Coordination normal.  No ataxia on finger to nose bilaterally. No pronator drift. 5/5 strength throughout. CN 2-12 intact.Equal grip strength. Sensation intact.   Skin: Skin is warm.  Psychiatric: He has a normal mood and affect. His behavior is normal.  Nursing note and vitals reviewed.    ED Treatments / Results  Labs (all labs ordered are listed, but only abnormal results are displayed) Labs Reviewed - No data to display  EKG None  Radiology Dg Elbow Complete Right  Result Date: 06/10/2018 CLINICAL DATA:  Olecranon pain after fall from couch. EXAM: RIGHT ELBOW - COMPLETE 3+ VIEW COMPARISON:  None. FINDINGS: There is no evidence of fracture, dislocation, or joint effusion. There is no evidence of arthropathy or other focal bone  abnormality. Soft tissues are unremarkable. IMPRESSION: No fracture, joint effusion or malalignment of the right elbow. Electronically Signed   By: Ashley Royalty M.D.   On: 06/10/2018 00:35    Procedures Procedures (including critical care time)  Medications Ordered in ED Medications  Tdap (BOOSTRIX) injection 0.5 mL (has no administration in time range)  carvedilol (COREG) tablet 25 mg (has no administration in time range)     Initial Impression / Assessment and Plan / ED Course  I have reviewed the triage vital signs and the nursing notes.  Pertinent labs & imaging results that were available during my care of the patient were reviewed by me and considered in my medical decision making (see chart for details).    Patient with skin tear to right elbow from fall last night.  No other injury.  No blood thinner use.  Will obtain x-ray, update tetanus, clean wound.  Patient hypertensive on arrival and did not take his medication tonight.  X-ray negative.  Tetanus is updated.  Blood pressure has improved on recheck.  Patient given his home medications. Wound cleaned and Steri-Strips applied.  Wound care instructions and outpatient follow-up instructions given. Final Clinical Impressions(s) / ED Diagnoses   Final diagnoses:  Skin tear of elbow without complication, initial encounter    ED Discharge Orders    None       Zakarie Sturdivant, Annie Main, MD 06/10/18 [phone]

## 2018-06-10 DIAGNOSIS — S59901A Unspecified injury of right elbow, initial encounter: Secondary | ICD-10-CM | POA: Diagnosis not present

## 2018-06-10 DIAGNOSIS — S51011A Laceration without foreign body of right elbow, initial encounter: Secondary | ICD-10-CM | POA: Diagnosis not present

## 2018-06-10 NOTE — Discharge Instructions (Addendum)
Keep the wound clean and dry.  Apply Neosporin twice daily.  Follow-up with your primary care physician.  Return to the ED if you develop new or worsening symptoms.

## 2018-06-11 ENCOUNTER — Encounter: Payer: Self-pay | Admitting: Internal Medicine

## 2018-06-11 ENCOUNTER — Ambulatory Visit (INDEPENDENT_AMBULATORY_CARE_PROVIDER_SITE_OTHER): Payer: Medicare Other | Admitting: Internal Medicine

## 2018-06-11 VITALS — BP 106/74 | HR 86 | Temp 98.6°F | Resp 16 | Ht 70.0 in | Wt 161.0 lb

## 2018-06-11 DIAGNOSIS — R55 Syncope and collapse: Secondary | ICD-10-CM

## 2018-06-11 DIAGNOSIS — R7989 Other specified abnormal findings of blood chemistry: Secondary | ICD-10-CM | POA: Diagnosis not present

## 2018-06-11 DIAGNOSIS — S51011D Laceration without foreign body of right elbow, subsequent encounter: Secondary | ICD-10-CM

## 2018-06-11 LAB — CBC WITH DIFFERENTIAL/PLATELET
BASOS ABS: 0.1 10*3/uL (ref 0.0–0.1)
Basophils Relative: 0.8 % (ref 0.0–3.0)
EOS ABS: 0.5 10*3/uL (ref 0.0–0.7)
Eosinophils Relative: 4.2 % (ref 0.0–5.0)
HCT: 33.7 % — ABNORMAL LOW (ref 39.0–52.0)
HEMOGLOBIN: 11.7 g/dL — AB (ref 13.0–17.0)
Lymphocytes Relative: 17.2 % (ref 12.0–46.0)
Lymphs Abs: 1.9 10*3/uL (ref 0.7–4.0)
MCHC: 34.6 g/dL (ref 30.0–36.0)
MCV: 91.5 fl (ref 78.0–100.0)
MONO ABS: 1 10*3/uL (ref 0.1–1.0)
Monocytes Relative: 8.8 % (ref 3.0–12.0)
Neutro Abs: 7.6 10*3/uL (ref 1.4–7.7)
Neutrophils Relative %: 69 % (ref 43.0–77.0)
Platelets: 215 10*3/uL (ref 150.0–400.0)
RBC: 3.68 Mil/uL — AB (ref 4.22–5.81)
RDW: 13.9 % (ref 11.5–15.5)
WBC: 11 10*3/uL — AB (ref 4.0–10.5)

## 2018-06-11 LAB — COMPREHENSIVE METABOLIC PANEL
ALBUMIN: 4 g/dL (ref 3.5–5.2)
ALK PHOS: 84 U/L (ref 39–117)
ALT: 9 U/L (ref 0–53)
AST: 9 U/L (ref 0–37)
BUN: 30 mg/dL — ABNORMAL HIGH (ref 6–23)
CO2: 26 mEq/L (ref 19–32)
CREATININE: 2.2 mg/dL — AB (ref 0.40–1.50)
Calcium: 8.9 mg/dL (ref 8.4–10.5)
Chloride: 100 mEq/L (ref 96–112)
GFR: 30.47 mL/min — ABNORMAL LOW (ref >60.00)
Glucose, Bld: 150 mg/dL — ABNORMAL HIGH (ref 70–99)
POTASSIUM: 3.7 meq/L (ref 3.5–5.1)
SODIUM: 138 meq/L (ref 135–145)
TOTAL PROTEIN: 6.9 g/dL (ref 6.0–8.3)
Total Bilirubin: 0.7 mg/dL (ref 0.2–1.2)

## 2018-06-11 MED ORDER — COLLAGENASE 250 UNIT/GM EX OINT: 1.0000 "application " | TOPICAL_OINTMENT | Freq: Every day | CUTANEOUS | 0 refills | Status: DC

## 2018-06-11 MED ORDER — DOXYCYCLINE HYCLATE 100 MG PO TABS: 100.0000 mg | ORAL_TABLET | Freq: Two times a day (BID) | ORAL | 0 refills | Status: DC

## 2018-06-11 NOTE — Patient Instructions (Addendum)
GO TO THE LAB : Get the blood work     GO TO THE FRONT DESK Schedule your next appointment for a checkup in 3 to 4 days.  Ask your son to change your bandages every day. Use the Vaseline gauze on top of the wound but ideally use Santyl, see prescription. Keep the area clean and dry  Take the antibiotic as prescribed  Go to the ER if fever, chills, your arm gets swelling or looks worse Also go to the ER if you are about to pass out or you pass out again;  we are referring you to the heart doctor

## 2018-06-11 NOTE — Progress Notes (Signed)
Pre visit review using our clinic review tool, if applicable. No additional management support is needed unless otherwise documented below in the visit note. 

## 2018-06-11 NOTE — Progress Notes (Signed)
Subjective:    Patient ID: Jim [name], male    DOB: 1931/12/20, 84 y.o.   MRN: [id number]  DOS:  06/11/2018 Type of visit - description : ER follow-up Interval history:  Jim [name] to the ER 06/09/2018, had a right elbow injury at home after he fell from a couch where he was asleep, had a skin tear with extensive bleeding. Got a tetanus shot, x-ray of the elbow was negative, Steri-Strips were placed, here for follow-up Since the ER visit, he has not changed the dressings.  Also, yesterday he was feeling very poorly, he felt he had a low-grade fever.  He was at his kitchen, fixng a sandwich and he suddenly pass out. There was no warning, reports that few seconds later he was completely awake. Prior to the incident, he had no headache, chest pain, difficulty breathing, palpitations. No postictal. He feels he has not injured his neck or back.  Denies neck, back pain or headache today.  Today he is back to baseline.  Review of Systems  Denies nausea, vomiting.  He had several BMs yesterday, something slightly unusual to him but stools were formed. No dysuria or gross hematuria.  Past Medical History:  Diagnosis Date  . Cholestatic hepatitis 2014  . COPD (chronic obstructive pulmonary disease) ([place])   . ED (erectile dysfunction)   . Essential hypertension, benign   . Hyperlipidemia   . Personal history of colonic polyps   . Prostate cancer ([place]) 2008   s/p XRT  . Renal mass 08/12/2014  . Syncope 2010   Evaluated by Dr. Caryl Comes - normal LVEF, possibly neurally mediated  . Type 2 diabetes mellitus ([place]) 10/2009    Past Surgical History:  Procedure Laterality Date  . CATARACT EXTRACTION Bilateral   . CHOLECYSTECTOMY N/A 08/22/2014   Procedure: LAPAROSCOPIC CHOLECYSTECTOMY WITH INTRAOPERATIVE CHOLANGIOGRAM;  Surgeon: Gayland Curry, MD;  Location: [facility];  Service: General;  Laterality: N/A;  . COLONOSCOPY    . ERCP N/A 08/16/2014   Procedure: ENDOSCOPIC RETROGRADE  CHOLANGIOPANCREATOGRAPHY (ERCP);  Surgeon: Milus Banister, MD;  Location: [facility];  Service: Endoscopy;  Laterality: N/A;  . EYE SURGERY Left 07/17/2014    Social History   Socioeconomic History  . Marital status: Divorced    Spouse name: Not on file  . Number of children: 2  . Years of education: Not on file  . Highest education level: Not on file  Occupational History  . Occupation: Retired     Fish farm manager: RETIRED  Social Needs  . Financial resource strain: Not on file  . Food insecurity:    Worry: Not on file    Inability: Not on file  . Transportation needs:    Medical: Not on file    Non-medical: Not on file  Tobacco Use  . Smoking status: Former Smoker    Packs/day: 2.00    Years: 40.00    Pack years: 80.00    Types: Cigarettes    Last attempt to quit: 11/24/2005    Years since quitting: 12.5  . Smokeless tobacco: Never Used  Substance and Sexual Activity  . Alcohol use: Yes    Comment: Rarely  . Drug use: No  . Sexual activity: Not on file  Lifestyle  . Physical activity:    Days per week: Not on file    Minutes per session: Not on file  . Stress: Not on file  Relationships  . Social connections:    Talks on phone: Not on file  Gets together: Not on file    Attends religious service: Not on file    Active member of club or organization: Not on file    Attends meetings of clubs or organizations: Not on file    Relationship status: Not on file  . Intimate partner violence:    Fear of current or ex partner: Not on file    Emotionally abused: Not on file    Physically abused: Not on file    Forced sexual activity: Not on file  Other Topics Concern  . Not on file  Social History Narrative   Independent on ADL    Lives with son and daughter in law   Has Gk and GGk      Allergies as of 06/11/2018      Reactions   Amlodipine Besylate    REACTION: swelling   Cardura [doxazosin Mesylate] Itching   Clonidine Derivatives Itching   Fluticasone-salmeterol  Hives, Itching   Nifedipine    Swelling, rash, itching      Medication List        Accurate as of 06/11/18 11:59 PM. Always use your most recent med list.          albuterol (2.5 MG/3ML) 0.083% nebulizer solution Commonly known as:  PROVENTIL Take 3 mLs (2.5 mg total) by nebulization 4 (four) times daily as needed.   aspirin 81 MG tablet Take 81 mg by mouth daily.   atorvastatin 20 MG tablet Commonly known as:  LIPITOR Take 0.5 tablets (10 mg total) by mouth daily.   BESIVANCE 0.6 % Susp Generic drug:  Besifloxacin HCl Instill 1 drop into both eyes four times daily for 2 days after each monthly eye injection   carvedilol 25 MG tablet Commonly known as:  COREG Take 1 tablet (25 mg total) by mouth 2 (two) times daily with a meal.   collagenase ointment Commonly known as:  SANTYL Apply 1 application topically daily.   COMBIVENT RESPIMAT 20-100 MCG/ACT Aers respimat Generic drug:  Ipratropium-Albuterol inhale 1 puff by mouth every 6 hours if needed for shortness of breath or wheezing   doxycycline 100 MG tablet Commonly known as:  VIBRA-TABS Take 1 tablet (100 mg total) by mouth 2 (two) times daily.   DULERA 200-5 MCG/ACT Aero Generic drug:  mometasone-formoterol Inhale 2 puffs into the lungs 2 (two) times daily.   furosemide 40 MG tablet Commonly known as:  LASIX Take 1 tablet (40 mg total) by mouth 2 (two) times daily.   losartan 100 MG tablet Commonly known as:  COZAAR Take 1 tablet (100 mg total) by mouth daily.   metFORMIN 500 MG tablet Commonly known as:  GLUCOPHAGE TAKE 2 TABLETS BY MOUTH EVERY MORNING AND 1 TABLET BY MOUTH EVERY EVENING   naproxen sodium 220 MG tablet Commonly known as:  ALEVE Take 440 mg by mouth 2 (two) times daily with a meal.   PRESERVISION AREDS PO Take 2 tablets by mouth daily.          Objective:   Physical Exam BP 106/74 (BP Location: Left Arm, Patient Position: Sitting, Cuff Size: Small)   Pulse 86   Temp 98.6 F  (37 C) (Oral)   Resp 16   Ht 5\' 10"  (1.778 m)   Wt 161 lb (73 kg)   SpO2 97%   BMI 23.10 kg/m  General:   Well developed, NAD, see BMI.  He seems to be at baseline HEENT:  Normocephalic . Face symmetric, atraumatic Neck: Normal carotid pulses  Lungs:  CTA B Normal respiratory effort, no intercostal retractions, no accessory muscle use. Heart: RRR,  no murmur.  No pretibial edema bilaterally  Right arm: See pictures, I removed the wrapping the ER placed,  It was dirty, dusty,  there is mild redness around the wound, the skin he tear was thin and  completely devitalized  Neurologic:  alert & oriented X3.  Speech normal, gait appropriate for age and unassisted Psych--  Cognition and judgment appear intact.  Cooperative with normal attention span and concentration.  Behavior appropriate. No anxious or depressed appearing.             Assessment & Plan:    Assessment   DM HTN Hyperlipidemia COPD, Respiratory failure chronic HR CT chest   >>> 01/2016 , saw Dr Melvyn Novas; no f/u needed Renal mass 9- 2015, dr Karsten Ro, sees urology next visit due  11-2016 H/o Prostate cancer 2008 XRT Syncope 2010 saw Dr. Caryl Comes H/o Cholestatic hepatitis- s/p ERCP 2014,  cholecystectomy 2015  PLAN: Wound, right arm: The wound was extensively irrigated with peroxide, I removed the devitalized skin, cover with a petroleum gauze, gauze and bandages.  Additional petroleum gauze and other supplies  provided to the patient. We will start him on doxycycline for possibly early cellulitis.  Will ask him to come back next week Strongly encouraged to ask son who lives with him change his bandages daily. Ideally I recommend Santyl, prescription provided. Syncope: As described above, had a syncope in 2010 but asx since. EKG today discussed with cardiology, lead I is probably flipped otherwise no worrisome features. Get a CMP, CBC, refer to cardiology, ER if symptoms come back again. All instructions  discussed carefully with the patient, he verbalized understanding Addendum: see labs , creat elevated advise pt to decrease losartan-loasix  Today, I spent more than 45   min with the patient: >50% of the time counseling regards wound care, assessing a new issue (syncope), coordinating his care

## 2018-06-12 ENCOUNTER — Emergency Department (HOSPITAL_COMMUNITY)
Admission: EM | Admit: 2018-06-12 | Discharge: 2018-06-12 | Disposition: A | Discharge status: Home / Self Care | Payer: Medicare Other | Attending: Emergency Medicine | Admitting: Emergency Medicine

## 2018-06-12 ENCOUNTER — Other Ambulatory Visit: Payer: Self-pay

## 2018-06-12 ENCOUNTER — Encounter (HOSPITAL_COMMUNITY): Payer: Self-pay

## 2018-06-12 DIAGNOSIS — Z79899 Other long term (current) drug therapy: Secondary | ICD-10-CM | POA: Insufficient documentation

## 2018-06-12 DIAGNOSIS — Z7984 Long term (current) use of oral hypoglycemic drugs: Secondary | ICD-10-CM | POA: Insufficient documentation

## 2018-06-12 DIAGNOSIS — Z5189 Encounter for other specified aftercare: Secondary | ICD-10-CM | POA: Insufficient documentation

## 2018-06-12 DIAGNOSIS — I10 Essential (primary) hypertension: Secondary | ICD-10-CM | POA: Diagnosis not present

## 2018-06-12 DIAGNOSIS — M7989 Other specified soft tissue disorders: Secondary | ICD-10-CM | POA: Insufficient documentation

## 2018-06-12 DIAGNOSIS — Z87891 Personal history of nicotine dependence: Secondary | ICD-10-CM | POA: Diagnosis not present

## 2018-06-12 DIAGNOSIS — Z7982 Long term (current) use of aspirin: Secondary | ICD-10-CM | POA: Diagnosis not present

## 2018-06-12 DIAGNOSIS — M79631 Pain in right forearm: Secondary | ICD-10-CM | POA: Diagnosis not present

## 2018-06-12 DIAGNOSIS — Z8546 Personal history of malignant neoplasm of prostate: Secondary | ICD-10-CM | POA: Insufficient documentation

## 2018-06-12 DIAGNOSIS — E119 Type 2 diabetes mellitus without complications: Secondary | ICD-10-CM | POA: Diagnosis not present

## 2018-06-12 DIAGNOSIS — J449 Chronic obstructive pulmonary disease, unspecified: Secondary | ICD-10-CM | POA: Insufficient documentation

## 2018-06-12 DIAGNOSIS — R2231 Localized swelling, mass and lump, right upper limb: Secondary | ICD-10-CM | POA: Diagnosis present

## 2018-06-12 DIAGNOSIS — Z4801 Encounter for change or removal of surgical wound dressing: Secondary | ICD-10-CM | POA: Diagnosis not present

## 2018-06-12 MED ORDER — BACITRACIN ZINC 500 UNIT/GM EX OINT
1.0000 "application " | TOPICAL_OINTMENT | Freq: Two times a day (BID) | CUTANEOUS | Status: DC
  Administered 2018-06-12: 1 via TOPICAL
  Filled 2018-06-12: qty 1.8

## 2018-06-12 NOTE — Assessment & Plan Note (Signed)
Wound, right arm: The wound was extensively irrigated with peroxide, I removed the devitalized skin, cover with a petroleum gauze, gauze and bandages.  Additional petroleum gauze and other supplies  provided to the patient. We will start him on doxycycline for possibly early cellulitis.  Will ask him to come back next week Strongly encouraged to ask son who lives with him change his bandages daily. Ideally I recommend Santyl, prescription provided. Syncope: As described above, had a syncope in 2010 but asx since. EKG today discussed with cardiology, lead I is probably flipped otherwise no worrisome features. Get a CMP, CBC, refer to cardiology, ER if symptoms come back again. All instructions discussed carefully with the patient, he verbalized understanding Addendum: see labs , creat elevated advise pt to decrease losartan-loasix

## 2018-06-12 NOTE — Discharge Instructions (Signed)
Your sweling is from the fluid in your arm - it is usually because the dressing was wrapped too tightly - apply topical antibiotic ointment daily - keep the dressings loose.

## 2018-06-12 NOTE — ED Triage Notes (Addendum)
Pt states he fell off the sofa a few days ago, and was seen at [facility].Pt comes in today due to swelling below the bandage- bandage appears to be wrapped too tightly. Removed by this RN. Skin tear and lac noted on right arm. Left open to air.

## 2018-06-12 NOTE — ED Provider Notes (Signed)
[place] DEPT Provider Note   CSN: [id number] Arrival date & time: 06/12/18  1845     History   Chief Complaint Chief Complaint  Patient presents with  . Extremity Laceration    HPI Jim [name] is a 84 y.o. male.  HPI  84 year old male presents for evaluation of his right upper extremity after he became swollen.  The arm had been seen 3 days ago when he had a skin tear after falling off the couch - states that he has done well overall - started on Doxy today by PCP after seen yesterday in f/u - was wrapped tightly - states that his forearm below the dressing was swollen and per his instructions on the paperwork, he was to follow up here for any swelling - he denies fevers or denies that the wound is in pain and the swollen part of the arm is not hurting either.  Sx are constant, mild, nothing makes better - worse after wrapping arm the last time.  Past Medical History:  Diagnosis Date  . Cholestatic hepatitis 2014  . COPD (chronic obstructive pulmonary disease) ([place])   . ED (erectile dysfunction)   . Essential hypertension, benign   . Hyperlipidemia   . Personal history of colonic polyps   . Prostate cancer ([place]) 2008   s/p XRT  . Renal mass 08/12/2014  . Syncope 2010   Evaluated by Dr. Caryl Comes - normal LVEF, possibly neurally mediated  . Type 2 diabetes mellitus ([place]) 10/2009    Patient Active Problem List   Diagnosis Date Noted  . Chronic respiratory failure with hypoxia ([place]) 02/25/2016  . PCP NOTES >>>>> 10/29/2015  . Solitary pulmonary nodule 08/16/2015  . Acute on chronic diastolic heart failure ([place]) 08/19/2014  . Choledocholithiasis 08/16/2014  . Abnormal LFTs 08/12/2014  . Renal mass 08/12/2014  . Diabetes ([place]) 08/12/2014  . Cholestatic hepatitis 11/02/2013  . Annual physical exam 07/23/2011  . COPD GOLD I 12/08/2007  . MALIGNANT NEOPLASM OF PROSTATE 11/02/2007  . ERECTILE DYSFUNCTION 09/17/2007  . Hyperlipidemia,  unspecified 12/17/2006  . Essential hypertension 12/17/2006    Past Surgical History:  Procedure Laterality Date  . CATARACT EXTRACTION Bilateral   . CHOLECYSTECTOMY N/A 08/22/2014   Procedure: LAPAROSCOPIC CHOLECYSTECTOMY WITH INTRAOPERATIVE CHOLANGIOGRAM;  Surgeon: Gayland Curry, MD;  Location: [facility];  Service: General;  Laterality: N/A;  . COLONOSCOPY    . ERCP N/A 08/16/2014   Procedure: ENDOSCOPIC RETROGRADE CHOLANGIOPANCREATOGRAPHY (ERCP);  Surgeon: Milus Banister, MD;  Location: [facility];  Service: Endoscopy;  Laterality: N/A;  . EYE SURGERY Left 07/17/2014        Home Medications    Prior to Admission medications   Medication Sig Start Date End Date Taking? Authorizing Provider  aspirin 81 MG tablet Take 81 mg by mouth daily.     Yes [provider]  carvedilol (COREG) 25 MG tablet Take 1 tablet (25 mg total) by mouth 2 (two) times daily with a meal. 04/16/18  Yes Paz, Alda Berthold, MD  collagenase (SANTYL) ointment Apply 1 application topically daily. 06/11/18  Yes Paz, Alda Berthold, MD  doxycycline (VIBRA-TABS) 100 MG tablet Take 1 tablet (100 mg total) by mouth 2 (two) times daily. 06/11/18  Yes Paz, Alda Berthold, MD  furosemide (LASIX) 40 MG tablet Take 1 tablet (40 mg total) by mouth 2 (two) times daily. Patient taking differently: Take 40 mg by mouth daily.  03/19/18  Yes Colon Branch, MD  losartan (COZAAR) 100 MG  tablet Take 1 tablet (100 mg total) by mouth daily. 03/19/18  Yes Paz, Alda Berthold, MD  metFORMIN (GLUCOPHAGE) 500 MG tablet TAKE 2 TABLETS BY MOUTH EVERY MORNING AND 1 TABLET BY MOUTH EVERY EVENING 04/26/18  Yes Paz, Alda Berthold, MD  mometasone-formoterol [facility]) 200-5 MCG/ACT AERO Inhale 2 puffs into the lungs 2 (two) times daily. 08/14/17  Yes Paz, Alda Berthold, MD  Multiple Vitamins-Minerals (PRESERVISION AREDS PO) Take 2 tablets by mouth daily.     Yes [provider]  naproxen sodium (ANAPROX) 220 MG tablet Take 440 mg by mouth 2 (two) times daily with a meal.   Yes [provider]  albuterol (PROVENTIL) (2.5 MG/3ML) 0.083% nebulizer solution Take 3 mLs (2.5 mg total) by nebulization 4 (four) times daily as needed. Patient taking differently: Take 2.5 mg by nebulization 4 (four) times daily as needed for wheezing or shortness of breath.  04/02/15   Colon Branch, MD  atorvastatin (LIPITOR) 20 MG tablet Take 0.5 tablets (10 mg total) by mouth daily. Patient not taking: Reported on 06/12/2018 03/29/18   Colon Branch, MD  BESIVANCE 0.6 % SUSP Instill 1 drop into both eyes four times daily for 2 days after each monthly eye injection 03/16/18   Bernarda Caffey, MD  COMBIVENT RESPIMAT 20-100 MCG/ACT AERS respimat inhale 1 puff by mouth every 6 hours if needed for shortness of breath or wheezing 05/22/17   Tanda Rockers, MD    Family History Family History  Problem Relation Age of Onset  . Cirrhosis Brother   . Dementia Mother   . Colon cancer Neg Hx   . Prostate cancer Neg Hx   . CAD Neg Hx     Social History Social History   Tobacco Use  . Smoking status: Former Smoker    Packs/day: 2.00    Years: 40.00    Pack years: 80.00    Types: Cigarettes    Last attempt to quit: 11/24/2005    Years since quitting: 12.5  . Smokeless tobacco: Never Used  Substance Use Topics  . Alcohol use: Yes    Comment: Rarely  . Drug use: No     Allergies   Amlodipine besylate; Cardura [doxazosin mesylate]; Clonidine derivatives; Fluticasone-salmeterol; and Nifedipine   Review of Systems Review of Systems  Constitutional: Negative for fever.  Skin: Positive for wound.     Physical Exam Updated Vital Signs BP (!) 147/86 (BP Location: Right Arm)   Pulse 77   Temp 98.5 F (36.9 C) (Oral)   Resp 14   Ht 5\' 10"  (1.778 m)   Wt 72.6 kg (160 lb)   BMI 22.96 kg/m   Physical Exam  Constitutional: He appears well-developed and well-nourished.  HENT:  Head: Normocephalic and atraumatic.  Eyes: Conjunctivae are normal. Right eye exhibits no discharge. Left eye exhibits  no discharge.  Pulmonary/Chest: Effort normal. No respiratory distress.  Musculoskeletal: He exhibits edema.  There is mild edema distal to the RUE dressing - it is the mid forearm - there is an open wound from a skin tear over the lateral forearm at the elbow - no bleeding, no surroudning redness and no drainage.   FROM of the R elbow without difficulty to flexion and extension and supination and pronation.  Neurological: He is alert. Coordination normal.  Skin: Skin is warm and dry. No rash noted. He is not diaphoretic. No erythema.  Psychiatric: He has a normal mood and affect.  Nursing note and vitals reviewed.  ED Treatments / Results  Labs (all labs ordered are listed, but only abnormal results are displayed) Labs Reviewed - No data to display  EKG None  Radiology No results found.  Procedures Procedures (including critical care time)  Medications Ordered in ED Medications  bacitracin ointment 1 application (has no administration in time range)     Initial Impression / Assessment and Plan / ED Course  I have reviewed the triage vital signs and the nursing notes.  Pertinent labs & imaging results that were available during my care of the patient were reviewed by me and considered in my medical decision making (see chart for details).     Wound appears healing, no signs of infection Edema in arm is from the wrap / dressing - pt informed of proper technique PT agreeable - this is not infected in appearance and his is already on doxy. sttable for d/c.  Final Clinical Impressions(s) / ED Diagnoses   Final diagnoses:  Encounter for wound care  Pain and swelling of forearm, right      Noemi Chapel, MD 06/12/18 2033

## 2018-06-14 ENCOUNTER — Telehealth: Payer: Self-pay

## 2018-06-14 NOTE — Telephone Encounter (Signed)
Copied from [place] [id number]. Topic: Inquiry >> Jun 11, 2018  5:17 PM Oliver Pila B wrote: Reason for CRM: pt called to be reminded of what 2 meds to stop taking that were recommended by Dr. Larose Kells, contact pt

## 2018-06-14 NOTE — Telephone Encounter (Signed)
Spoke w/ Pt- per Dr. Larose Kells he should decrease losartan to 1/2 tablet daily and Lasix- he should take only once daily instead of bid. Pt verbalized understanding.

## 2018-06-16 ENCOUNTER — Encounter: Payer: Self-pay | Admitting: Family Medicine

## 2018-06-16 ENCOUNTER — Ambulatory Visit (INDEPENDENT_AMBULATORY_CARE_PROVIDER_SITE_OTHER): Payer: Medicare Other | Admitting: Family Medicine

## 2018-06-16 ENCOUNTER — Telehealth: Payer: Self-pay

## 2018-06-16 VITALS — BP 142/90 | HR 70 | Temp 97.7°F | Ht 70.0 in | Wt 160.0 lb

## 2018-06-16 DIAGNOSIS — S51011D Laceration without foreign body of right elbow, subsequent encounter: Secondary | ICD-10-CM

## 2018-06-16 MED ORDER — SILVER SULFADIAZINE 1 % EX CREA: 1.0000 "application " | TOPICAL_CREAM | Freq: Every day | CUTANEOUS | 0 refills | Status: DC

## 2018-06-16 MED ORDER — ATORVASTATIN CALCIUM 20 MG PO TABS: 10.0000 mg | ORAL_TABLET | Freq: Every day | ORAL | 1 refills | Status: DC

## 2018-06-16 MED ORDER — NAPROXEN SODIUM 220 MG PO TABS: 440.0000 mg | ORAL_TABLET | Freq: Two times a day (BID) | ORAL | 0 refills | Status: DC

## 2018-06-16 MED ORDER — FUROSEMIDE 40 MG PO TABS: 40.0000 mg | ORAL_TABLET | Freq: Every day | ORAL | 0 refills | Status: DC

## 2018-06-16 MED ORDER — MOMETASONE FURO-FORMOTEROL FUM 200-5 MCG/ACT IN AERO: 2.0000 | INHALATION_SPRAY | Freq: Two times a day (BID) | RESPIRATORY_TRACT | 0 refills | Status: DC

## 2018-06-16 MED ORDER — IPRATROPIUM-ALBUTEROL 20-100 MCG/ACT IN AERS
INHALATION_SPRAY | RESPIRATORY_TRACT | 2 refills | Status: DC
Start: 2018-06-16 — End: 2019-06-16

## 2018-06-16 MED ORDER — LOSARTAN POTASSIUM 100 MG PO TABS: 50.0000 mg | ORAL_TABLET | Freq: Every day | ORAL | 0 refills | Status: DC

## 2018-06-16 NOTE — Telephone Encounter (Signed)
PA approved.  PA Case: [id number], Status: Approved, Coverage Starts on: 11/22/2017, Coverage Ends on: 11/23/2018. Questions? Contact [phone].

## 2018-06-16 NOTE — Patient Instructions (Addendum)
Do not vigorously scrub. Apply the new cream daily. Keep the area clean and dry.   Things to look out for: increasing pain not relieved by ibuprofen/acetaminophen, fevers, spreading redness, drainage of pus, or foul odor.  Keep your follow up appointment with Dr Larose Kells.  Let us know if you need anything.

## 2018-06-16 NOTE — Telephone Encounter (Signed)
PA initiated via Covermymeds; KEY: [id number]. Awaiting determination.

## 2018-06-16 NOTE — Progress Notes (Signed)
Chief Complaint  Patient presents with  . Follow-up    arm injury    Subjective: Patient is a 84 y.o. male here for arm injury.  Pt seen on 7/19 for skin injury. Was rx'd Santyl, but pharmacy did not have. Had some swelling a few days later and went to ED. It was found that his dressing was too tight. He has been using daily Neosporin and keeping it dressed. No pain, drainage, fevers, odor, redness. Has f/u with Dr. Larose Kells in 1 week.    ROS: Skin: As noted in HPI  Past Medical History:  Diagnosis Date  . Cholestatic hepatitis 2014  . COPD (chronic obstructive pulmonary disease) ([place])   . ED (erectile dysfunction)   . Essential hypertension, benign   . Hyperlipidemia   . Personal history of colonic polyps   . Prostate cancer ([place]) 2008   s/p XRT  . Renal mass 08/12/2014  . Syncope 2010   Evaluated by Dr. Caryl Comes - normal LVEF, possibly neurally mediated  . Type 2 diabetes mellitus ([place]) 10/2009    Objective: BP (!) 142/90 (BP Location: Left Arm, Patient Position: Sitting, Cuff Size: Normal)   Pulse 70   Temp 97.7 F (36.5 C) (Oral)   Ht 5\' 10"  (1.778 m)   Wt 160 lb (72.6 kg)   SpO2 99%   BMI 22.96 kg/m  General: Awake, appears stated age Skin: See below. Lungs: No accessory muscle use Psych: Age appropriate judgment and insight, normal affect and mood   R forearm  Assessment and Plan: Skin tear of right elbow without complication, subsequent encounter - Plan: silver sulfADIAZINE (SILVADENE) 1 % cream  Swelling has resolved from ED. No signs of infection. Daily Silvadene to help prevent infection. Already has f/u with PCP.  It seems Dr. Larose Kells has recently changed his BP regimen. He has an appt in 1 week, will recheck then. F/u as originally scheduled. The patient voiced understanding and agreement to the plan.  [place], DO 06/16/18  8:43 AM

## 2018-06-16 NOTE — Progress Notes (Signed)
Pre visit review using our clinic review tool, if applicable. No additional management support is needed unless otherwise documented below in the visit note. 

## 2018-06-23 ENCOUNTER — Encounter: Payer: Self-pay | Admitting: Internal Medicine

## 2018-06-23 ENCOUNTER — Ambulatory Visit (INDEPENDENT_AMBULATORY_CARE_PROVIDER_SITE_OTHER): Payer: Medicare Other | Admitting: Internal Medicine

## 2018-06-23 VITALS — BP 132/78 | HR 65 | Temp 97.5°F | Resp 16 | Ht 70.0 in | Wt 158.2 lb

## 2018-06-23 DIAGNOSIS — I10 Essential (primary) hypertension: Secondary | ICD-10-CM

## 2018-06-23 DIAGNOSIS — S51011D Laceration without foreign body of right elbow, subsequent encounter: Secondary | ICD-10-CM

## 2018-06-23 LAB — BASIC METABOLIC PANEL
BUN: 18 mg/dL (ref 6–23)
CO2: 28 mEq/L (ref 19–32)
Calcium: 9.3 mg/dL (ref 8.4–10.5)
Chloride: 104 mEq/L (ref 96–112)
Creatinine, Ser: 1.32 mg/dL (ref 0.40–1.50)
GFR: 54.93 mL/min — ABNORMAL LOW (ref >60.00)
Glucose, Bld: 99 mg/dL (ref 70–99)
Potassium: 4.2 mEq/L (ref 3.5–5.1)
SODIUM: 140 meq/L (ref 135–145)

## 2018-06-23 NOTE — Progress Notes (Signed)
Pre visit review using our clinic review tool, if applicable. No additional management support is needed unless otherwise documented below in the visit note. 

## 2018-06-23 NOTE — Progress Notes (Signed)
Subjective:    Patient ID: Jim [name], male    DOB: [birth year], 84 y.o.   MRN: [id number]  DOS:  06/23/2018 Type of visit - description :  Interval history: Was seen last by me 06/11/2018 due to a wound on the right arm and  syncope. Since then, he went to the ER the next day for a wound check and there was no evidence of infection, then he was seen at this office 06/16/2018, again no sign of infection noted.  He was recommended Silvadene.  Review of Systems Today he reports he is doing well, wound looks better Creatinine was elevated, medications adjusted, reports good compliance. Denies any lower extremity swelling, ambulatory BPs in the 130s.  Past Medical History:  Diagnosis Date  . Cholestatic hepatitis 2014  . COPD (chronic obstructive pulmonary disease) ([place])   . ED (erectile dysfunction)   . Essential hypertension, benign   . Hyperlipidemia   . Personal history of colonic polyps   . Prostate cancer ([place]) 2008   s/p XRT  . Renal mass 08/12/2014  . Syncope 2010   Evaluated by Dr. Caryl Comes - normal LVEF, possibly neurally mediated  . Type 2 diabetes mellitus ([place]) 10/2009    Past Surgical History:  Procedure Laterality Date  . CATARACT EXTRACTION Bilateral   . CHOLECYSTECTOMY N/A 08/22/2014   Procedure: LAPAROSCOPIC CHOLECYSTECTOMY WITH INTRAOPERATIVE CHOLANGIOGRAM;  Surgeon: Gayland Curry, MD;  Location: [facility];  Service: General;  Laterality: N/A;  . COLONOSCOPY    . ERCP N/A 08/16/2014   Procedure: ENDOSCOPIC RETROGRADE CHOLANGIOPANCREATOGRAPHY (ERCP);  Surgeon: Milus Banister, MD;  Location: [facility];  Service: Endoscopy;  Laterality: N/A;  . EYE SURGERY Left 07/17/2014    Social History   Socioeconomic History  . Marital status: Divorced    Spouse name: Not on file  . Number of children: 2  . Years of education: Not on file  . Highest education level: Not on file  Occupational History  . Occupation: Retired     Fish farm manager: RETIRED  Social Needs  .  Financial resource strain: Not on file  . Food insecurity:    Worry: Not on file    Inability: Not on file  . Transportation needs:    Medical: Not on file    Non-medical: Not on file  Tobacco Use  . Smoking status: Former Smoker    Packs/day: 2.00    Years: 40.00    Pack years: 80.00    Types: Cigarettes    Last attempt to quit: 11/24/2005    Years since quitting: 12.5  . Smokeless tobacco: Never Used  Substance and Sexual Activity  . Alcohol use: Yes    Comment: Rarely  . Drug use: No  . Sexual activity: Not Currently  Lifestyle  . Physical activity:    Days per week: Not on file    Minutes per session: Not on file  . Stress: Not on file  Relationships  . Social connections:    Talks on phone: Not on file    Gets together: Not on file    Attends religious service: Not on file    Active member of club or organization: Not on file    Attends meetings of clubs or organizations: Not on file    Relationship status: Not on file  . Intimate partner violence:    Fear of current or ex partner: Not on file    Emotionally abused: Not on file    Physically abused: Not on  file    Forced sexual activity: Not on file  Other Topics Concern  . Not on file  Social History Narrative   Independent on ADL    Lives with son and daughter in law   Has Gk and GGk      Allergies as of 06/23/2018      Reactions   Amlodipine Besylate    REACTION: swelling   Cardura [doxazosin Mesylate] Itching   Clonidine Derivatives Itching   Fluticasone-salmeterol Hives, Itching   Nifedipine    Swelling, rash, itching      Medication List        Accurate as of 06/23/18  9:48 PM. Always use your most recent med list.          albuterol (2.5 MG/3ML) 0.083% nebulizer solution Commonly known as:  PROVENTIL Take 3 mLs (2.5 mg total) by nebulization 4 (four) times daily as needed.   aspirin 81 MG tablet Take 81 mg by mouth daily.   atorvastatin 20 MG tablet Commonly known as:  LIPITOR Take  0.5 tablets (10 mg total) by mouth daily.   BESIVANCE 0.6 % Susp Generic drug:  Besifloxacin HCl Instill 1 drop into both eyes four times daily for 2 days after each monthly eye injection   carvedilol 25 MG tablet Commonly known as:  COREG Take 1 tablet (25 mg total) by mouth 2 (two) times daily with a meal.   collagenase ointment Commonly known as:  SANTYL Apply 1 application topically daily.   doxycycline 100 MG tablet Commonly known as:  VIBRA-TABS Take 1 tablet (100 mg total) by mouth 2 (two) times daily.   furosemide 40 MG tablet Commonly known as:  LASIX Take 1 tablet (40 mg total) by mouth daily.   Ipratropium-Albuterol 20-100 MCG/ACT Aers respimat Commonly known as:  COMBIVENT RESPIMAT inhale 1 puff by mouth every 6 hours if needed for shortness of breath or wheezing   losartan 100 MG tablet Commonly known as:  COZAAR Take 0.5 tablets (50 mg total) by mouth daily.   metFORMIN 500 MG tablet Commonly known as:  GLUCOPHAGE TAKE 2 TABLETS BY MOUTH EVERY MORNING AND 1 TABLET BY MOUTH EVERY EVENING   mometasone-formoterol 200-5 MCG/ACT Aero Commonly known as:  DULERA Inhale 2 puffs into the lungs 2 (two) times daily.   naproxen sodium 220 MG tablet Commonly known as:  ALEVE Take 2 tablets (440 mg total) by mouth 2 (two) times daily with a meal.   PRESERVISION AREDS PO Take 2 tablets by mouth daily.   silver sulfADIAZINE 1 % cream Commonly known as:  SILVADENE Apply 1 application topically daily.          Objective:   Physical Exam BP 132/78 (BP Location: Left Arm, Patient Position: Sitting, Cuff Size: Small)   Pulse 65   Temp (!) 97.5 F (36.4 C) (Oral)   Resp 16   Ht 5\' 10"  (1.778 m)   Wt 158 lb 4 oz (71.8 kg)   SpO2 95%   BMI 22.71 kg/m  General:   Well developed, NAD, see BMI.  HEENT:  Normocephalic . Face symmetric, atraumatic Lungs:  CTA B Normal respiratory effort, no intercostal retractions, no accessory muscle use. Heart: RRR,  no  murmur.  No pretibial edema bilaterally  Skin: Not pale. Not jaundice Neurologic:  alert & oriented X3.  Speech normal, gait appropriate for age and unassisted Psych--  Cognition and judgment appear intact.  Cooperative with normal attention span and concentration.  Behavior appropriate. No anxious  or depressed appearing.        Assessment & Plan:    Assessment   DM HTN Hyperlipidemia COPD, Respiratory failure chronic HR CT chest   >>> 01/2016 , saw Dr Melvyn Novas; no f/u needed Renal mass 9- 2015, dr Karsten Ro, sees urology next visit due  11-2016 H/o Prostate cancer 2008 XRT Syncope 2010 saw Dr. Caryl Comes H/o Cholestatic hepatitis- s/p ERCP 2014,  cholecystectomy 2015  PLAN: Wound, right arm: Improved, continue local care until better. Syncope: See last visit.  Cardiology contacted the patient, recommend to call them back.  No further symptoms. HTN: Last creatinine elevated, at the time BP was 106.  Was recommended to decrease Lasix to only 40 mg daily and losartan to only 50 mg daily.  Today BP is 132/78.  Recommend to continue with present care, monitor for ambulatory BPs, check a BMP RTC 3 months

## 2018-06-23 NOTE — Assessment & Plan Note (Signed)
Wound, right arm: Improved, continue local care until better. Syncope: See last visit.  Cardiology contacted the patient, recommend to call them back.  No further symptoms. HTN: Last creatinine elevated, at the time BP was 106.  Was recommended to decrease Lasix to only 40 mg daily and losartan to only 50 mg daily.  Today BP is 132/78.  Recommend to continue with present care, monitor for ambulatory BPs, check a BMP RTC 3 months

## 2018-06-23 NOTE — Patient Instructions (Signed)
GO TO THE LAB : Get the blood work     GO TO THE FRONT DESK Schedule your next appointment for a checkup in 3 months  Call cardiology to get an appointment regards your recent passing out. [phone]   Continue the same medications Remember to take losartan 100 mg only half tablet a day and  furosemide only 1 tablet a day.   Check the  blood pressure 2 or 3 times a week Be sure your blood pressure is between 110/65 and  135/85. If it is consistently higher or lower, let me know

## 2018-06-24 ENCOUNTER — Ambulatory Visit (INDEPENDENT_AMBULATORY_CARE_PROVIDER_SITE_OTHER): Payer: Medicare Other | Admitting: Cardiology

## 2018-06-24 ENCOUNTER — Encounter: Payer: Self-pay | Admitting: Cardiology

## 2018-06-24 VITALS — BP 179/86 | HR 62 | Ht 70.0 in | Wt 156.8 lb

## 2018-06-24 DIAGNOSIS — C61 Malignant neoplasm of prostate: Secondary | ICD-10-CM | POA: Diagnosis not present

## 2018-06-24 DIAGNOSIS — I5032 Chronic diastolic (congestive) heart failure: Secondary | ICD-10-CM | POA: Diagnosis not present

## 2018-06-24 DIAGNOSIS — R55 Syncope and collapse: Secondary | ICD-10-CM

## 2018-06-24 DIAGNOSIS — E782 Mixed hyperlipidemia: Secondary | ICD-10-CM | POA: Diagnosis not present

## 2018-06-24 DIAGNOSIS — I10 Essential (primary) hypertension: Secondary | ICD-10-CM | POA: Diagnosis not present

## 2018-06-24 HISTORY — PX: OTHER SURGICAL HISTORY: SHX169

## 2018-06-24 HISTORY — PX: TRANSTHORACIC ECHOCARDIOGRAM: SHX275

## 2018-06-24 NOTE — Patient Instructions (Signed)
Medication Instructions:   NO CHANGE  Testing/Procedures:  Your physician has recommended that you wear a 30 DAY  event monitor. Event monitors are medical devices that record the heart's electrical activity. Doctors most often [place] these monitors to diagnose arrhythmias. Arrhythmias are problems with the speed or rhythm of the heartbeat. The monitor is a small, portable device. You can wear one while you do your normal daily activities. This is usually used to diagnose what is causing palpitations/syncope (passing out).   Your physician has requested that you have an echocardiogram. Echocardiography is a painless test that uses sound waves to create images of your heart. It provides your doctor with information about the size and shape of your heart and how well your heart's chambers and valves are working. This procedure takes approximately one hour. There are no restrictions for this procedure.   Your physician has requested that you have a carotid duplex. This test is an ultrasound of the carotid arteries in your neck. It looks at blood flow through these arteries that supply the brain with blood. Allow one hour for this exam. There are no restrictions or special instructions.    Follow-Up:  Your physician recommends that you schedule a follow-up appointment in: [place]

## 2018-06-24 NOTE — Progress Notes (Signed)
PCP: Colon Branch, MD  Clinic Note: Chief Complaint  Patient presents with  . New Patient (Initial Visit)    syncope    HPI: Jim [name] is a 84 y.o. male with a PMH noted below who is being seen today for the evaluation of SYNCOPE at the request of Colon Branch, MD.  Pertinent PMH: COPD, HTH, DM-2, HLD -- prior evaluation for Syncope in 2010 (Dr. Caryl Comes) - thought to be Neurally mediated.   Jim [name] was last seen on July 31st 2019 by Dr. Larose Kells --> this was part of a follow-up evaluation  of right elbow injury where he had extensive skin tear having fallen off of his couch.  Initial evaluation in the ER on July 17 led to Steri-Strips being placed and a tetanus shot being given.  The day after ER, he was not feeling well and was trying to fix his lunch and proceeded to collapse/pass out.  He woke up several seconds later and has not had any symptoms since.  -->  Given his prior history of syncope, he is referred for cardiology evaluation.    Recent Hospitalizations:  ER visit May 10, 2018: R elbow/forearm skin abrasion/tear after falling off his couch & scraping his arm on the coffee table.  - (presented the next day). - wound dressed & he was given a Tetanus shot.  Studies Personally Reviewed - (if available, images/films reviewed: From Epic Chart or Care Everywhere)  Transthoracic Echo September 2015: COPD exacerbation. -Mild LVH.  EF 50-55%. No RWMA. Gr 1 DD.  Aortic Sclerosis w/o Stenosis.   Interval History: Jim [name] presents today to discuss an episode of syncope that occurred earlier this month.  Based on this occurred a day after he had a tetanus shot in the emergency room for treatment of a large skin abrasion.  He says the day after he just felt weak lethargic.  He felt nauseated but maybe low-grade fever.  He noted that he hadbeen up, had decent breakfas and was just about to break the same was for lunch when he collapsed.  This is the first time he has had an  episode like this since 2010 and he did not take much of it.  He denied any sensation of.   Palpitations, rapid heartbeats associated.  Pain getting short of breath after having a chest tightness or pressure.  He states that he is felt queasy and blacked out.   No chest pain orwith rest or exertion.  With his COPD, he does have chronic exertional dyspnea but no change.  No PND, orthopnea or edema.  No palpitations, lightheadedness, dizziness, weakness and has not had any further episodes of syncope or near syncope.    syncope/near syncope. No TIA/amaurosis fugax symptoms. No melena, hematochezia, hematuria, or epstaxis. No claudication.  ROS: A comprehensive was performed. Review of Systems  Constitutional: Negative for chills, fever and malaise/fatigue.  HENT: Negative for congestion and nosebleeds.   Respiratory: Positive for cough (Morning,), shortness of breath and wheezing.        Baseline COPD.  No change  Cardiovascular: Negative for palpitations.  Gastrointestinal: Negative for blood in stool and melena.  Genitourinary: Negative for hematuria.  Musculoskeletal: Negative for falls and joint pain.  Neurological: Positive for dizziness (Sometimes positional). Negative for headaches.  Endo/Heme/Allergies: Negative for environmental allergies.  Psychiatric/Behavioral: The patient is not nervous/anxious.     I have reviewed and (if needed) personally updated the patient's problem list, medications, allergies, past  medical and surgical history, social and family history.   Past Medical History:  Diagnosis Date  . Cholestatic hepatitis 2014  . COPD (chronic obstructive pulmonary disease) ([place])   . ED (erectile dysfunction)   . Essential hypertension, benign   . Hyperlipidemia   . Personal history of colonic polyps   . Prostate cancer ([place]) 2008   s/p XRT  . Renal mass 08/12/2014  . Syncope 2010   Evaluated by Dr. Caryl Comes - normal LVEF, possibly neurally mediated  . Type 2  diabetes mellitus ([place]) 10/2009    Past Surgical History:  Procedure Laterality Date  . CATARACT EXTRACTION Bilateral   . CHOLECYSTECTOMY N/A 08/22/2014   Procedure: LAPAROSCOPIC CHOLECYSTECTOMY WITH INTRAOPERATIVE CHOLANGIOGRAM;  Surgeon: Gayland Curry, MD;  Location: [facility];  Service: General;  Laterality: N/A;  . COLONOSCOPY    . ERCP N/A 08/16/2014   Procedure: ENDOSCOPIC RETROGRADE CHOLANGIOPANCREATOGRAPHY (ERCP);  Surgeon: Milus Banister, MD;  Location: [facility];  Service: Endoscopy;  Laterality: N/A;  . EYE SURGERY Left 07/17/2014  . TRANSTHORACIC ECHOCARDIOGRAM  07/2014   COPD exacerbation. -Mild LVH.  EF 50-55%. No RWMA. Gr 1 DD.  Aortic Sclerosis w/o Stenosis.     Current Meds  Medication Sig  . albuterol (PROVENTIL) (2.5 MG/3ML) 0.083% nebulizer solution Take 3 mLs (2.5 mg total) by nebulization 4 (four) times daily as needed. (Patient taking differently: Take 2.5 mg by nebulization 4 (four) times daily as needed for wheezing or shortness of breath. )  . aspirin 81 MG tablet Take 81 mg by mouth daily.    Marland Kitchen atorvastatin (LIPITOR) 20 MG tablet Take 0.5 tablets (10 mg total) by mouth daily.  Marland Kitchen BESIVANCE 0.6 % SUSP Instill 1 drop into both eyes four times daily for 2 days after each monthly eye injection  . carvedilol (COREG) 25 MG tablet Take 1 tablet (25 mg total) by mouth 2 (two) times daily with a meal.  . furosemide (LASIX) 40 MG tablet Take 1 tablet (40 mg total) by mouth daily.  . Ipratropium-Albuterol (COMBIVENT RESPIMAT) 20-100 MCG/ACT AERS respimat inhale 1 puff by mouth every 6 hours if needed for shortness of breath or wheezing  . losartan (COZAAR) 100 MG tablet Take 0.5 tablets (50 mg total) by mouth daily.  . mometasone-formoterol (DULERA) 200-5 MCG/ACT AERO Inhale 2 puffs into the lungs 2 (two) times daily.  . Multiple Vitamins-Minerals (PRESERVISION AREDS PO) Take 2 tablets by mouth daily.    . naproxen sodium (ALEVE) 220 MG tablet Take 2 tablets (440 mg total) by mouth  2 (two) times daily with a meal.  . silver sulfADIAZINE (SILVADENE) 1 % cream Apply 1 application topically daily.  . [DISCONTINUED] collagenase (SANTYL) ointment Apply 1 application topically daily.  . [DISCONTINUED] doxycycline (VIBRA-TABS) 100 MG tablet Take 1 tablet (100 mg total) by mouth 2 (two) times daily.  . [DISCONTINUED] metFORMIN (GLUCOPHAGE) 500 MG tablet TAKE 2 TABLETS BY MOUTH EVERY MORNING AND 1 TABLET BY MOUTH EVERY EVENING    Allergies  Allergen Reactions  . Amlodipine Besylate     REACTION: swelling  . Cardura [Doxazosin Mesylate] Itching  . Clonidine Derivatives Itching  . Fluticasone-Salmeterol Hives and Itching  . Nifedipine     Swelling, rash, itching    Social History   Tobacco Use  . Smoking status: Former Smoker    Packs/day: 2.00    Years: 40.00    Pack years: 80.00    Types: Cigarettes    Last attempt to quit: 11/24/2005  Years since quitting: 12.5  . Smokeless tobacco: Never Used  Substance Use Topics  . Alcohol use: Yes    Comment: Rarely  . Drug use: No   Social History   Social History Narrative   Independent on ADL    Lives with son and daughter in law   Has Gk and GGk    family history includes Cirrhosis in his brother; Dementia in his mother.  Wt Readings from Last 3 Encounters:  06/24/18 156 lb 12.8 oz (71.1 kg)  06/23/18 158 lb 4 oz (71.8 kg)  06/16/18 160 lb (72.6 kg)    PHYSICAL EXAM BP (!) 179/86   Pulse 62   Ht 5\' 10"  (1.778 m)   Wt 156 lb 12.8 oz (71.1 kg)   BMI 22.50 kg/m  Physical Exam  Constitutional: He is oriented to person, place, and time. He appears well-developed and well-nourished. No distress.  Quite healthy for stated age.  HENT:  Head: Normocephalic and atraumatic.  Eyes: EOM are normal. No scleral icterus.  Neck: Normal range of motion. Neck supple. No JVD present. Carotid bruit is not present.  Cardiovascular: Normal rate, regular rhythm, intact distal pulses and normal pulses.  No extrasystoles  are present. Exam reveals no gallop and no friction rub.  Murmur heard.  Harsh crescendo-decrescendo midsystolic murmur is present with a grade of 2/6 at the upper right sternal border radiating to the neck. Pulmonary/Chest: Effort normal and breath sounds normal. No respiratory distress. He has no wheezes. He has no rales.  Abdominal: Soft. Bowel sounds are normal. He exhibits no distension. There is no tenderness. There is no rebound.  No HSM  Musculoskeletal: Normal range of motion. He exhibits no edema or deformity.  Neurological: He is alert and oriented to person, place, and time. No cranial nerve deficit.  Skin: Skin is warm and dry. No erythema.  Psychiatric: He has a normal mood and affect. His behavior is normal. Judgment and thought content normal.  Vitals reviewed.    Adult ECG Report   EKG from PCP visit on July 19: Sinus  Rhythm 76; Large V1 Rwave 0 CRO RVH/pulmonary disease pattern.  Non-specific T wave abnormalities. Computer read of Old anteroseptal infarct. -- NOT ACCURATE   Other studies Reviewed: Additional studies/ records that were reviewed today include:  Recent Labs:   Lab Results  Component Value Date   CHOL 146 02/10/2018   HDL 42.20 02/10/2018   LDLCALC 76 02/10/2018   TRIG 139.0 02/10/2018   CHOLHDL 3 02/10/2018   Lab Results  Component Value Date   CREATININE 1.32 06/23/2018   BUN 18 06/23/2018   NA 140 06/23/2018   K 4.2 06/23/2018   CL 104 06/23/2018   CO2 28 06/23/2018   Lab Results  Component Value Date   HGBA1C 6.1 02/10/2018    ASSESSMENT / PLAN: Problem List Items Addressed This Visit    Chronic diastolic heart failure (HCC) (Chronic)    The symptoms of prior diagnosis from 2015, probably reactive given the fact that there is only grade 1 diastolic dysfunction he denies any PND orthopnea type symptoms.  No edema.  Will reassess echo just to ensure no change.      Essential hypertension (Chronic)    Quite hypertensive today,  but has been fine as recently as his last visit with PCP. We will defer further treatment at this time since we are still evaluating the etiology for syncope. Would probably be more inclined to allow for mild permissive hypertension  with a goal blood pressure in the 130-150 mmHg range.      Hyperlipidemia, unspecified (Chronic)    Last evaluation showed a controlled lipids.  Followed by PCP.      Syncope - Primary    Really tell what this episode was.  It does not sound arrhythmia genic.  He had no sensation of rapid irregular heartbeats or palpitations associated with it.  It could potentially be bradycardia mediated however there is nothing to suggest bradycardia on his EKG. I do think is probably related to him not feeling well, having a low-grade fever after tetanus shot.  He may very well just a little bit queasy and had a vagal response.   Given his age and cardiac risk factors I think is not unreasonable to evaluate:  Transthoracic Echo to exclude structural abnormality or valvular disease (he did have aortic sclerosis the past)  30-day event monitor to evaluate for potential arrhythmias or bradycardia.  Carotid Dopplers although not likely to be beneficial, perhaps evidence of vertebral artery disease would be helpful.      Relevant Orders   Cardiac event monitor   ECHOCARDIOGRAM COMPLETE   VAS US CAROTID     Recommended he really hydrate--.  I suspect that this was a vagal episode in the setting of nausea and low-grade fever response to tetanus shot.  However we will exclude arrhythmia, vertebral stenosis or structural cardiac abnormality..   I spent a total of 35 - 40 minutes with the patient and chart review. >  50% of the time was spent in direct patient consultation.   Current medicines are reviewed at length with the patient today.  (+/- concerns) none The following changes have been made:  none  Patient Instructions  Medication Instructions:   NO  CHANGE  Testing/Procedures:  Your physician has recommended that you wear a 30 DAY  event monitor. Event monitors are medical devices that record the heart's electrical activity. Doctors most often [place] these monitors to diagnose arrhythmias. Arrhythmias are problems with the speed or rhythm of the heartbeat. The monitor is a small, portable device. You can wear one while you do your normal daily activities. This is usually used to diagnose what is causing palpitations/syncope (passing out).   Your physician has requested that you have an echocardiogram. Echocardiography is a painless test that uses sound waves to create images of your heart. It provides your doctor with information about the size and shape of your heart and how well your heart's chambers and valves are working. This procedure takes approximately one hour. There are no restrictions for this procedure.   Your physician has requested that you have a carotid duplex. This test is an ultrasound of the carotid arteries in your neck. It looks at blood flow through these arteries that supply the brain with blood. Allow one hour for this exam. There are no restrictions or special instructions.    Follow-Up:  Your physician recommends that you schedule a follow-up appointment in: 3 MONTHS WITH DR Ellyn Hack       Studies Ordered:   Orders Placed This Encounter  Procedures  . Cardiac event monitor  . ECHOCARDIOGRAM COMPLETE      Glenetta Hew, M.D., M.S. Interventional Cardiologist   Pager # [phone] Phone # [phone] [address] [place], [place] [address]   Thank you for choosing [facility] at [facility]!!

## 2018-06-25 ENCOUNTER — Other Ambulatory Visit: Payer: Self-pay | Admitting: Internal Medicine

## 2018-06-26 ENCOUNTER — Encounter: Payer: Self-pay | Admitting: Cardiology

## 2018-06-26 NOTE — Assessment & Plan Note (Signed)
Really tell what this episode was.  It does not sound arrhythmia genic.  He had no sensation of rapid irregular heartbeats or palpitations associated with it.  It could potentially be bradycardia mediated however there is nothing to suggest bradycardia on his EKG. I do think is probably related to him not feeling well, having a low-grade fever after tetanus shot.  He may very well just a little bit queasy and had a vagal response.   Given his age and cardiac risk factors I think is not unreasonable to evaluate:  Transthoracic Echo to exclude structural abnormality or valvular disease (he did have aortic sclerosis the past)  30-day event monitor to evaluate for potential arrhythmias or bradycardia.  Carotid Dopplers although not likely to be beneficial, perhaps evidence of vertebral artery disease would be helpful.

## 2018-06-26 NOTE — Assessment & Plan Note (Signed)
The symptoms of prior diagnosis from 2015, probably reactive given the fact that there is only grade 1 diastolic dysfunction he denies any PND orthopnea type symptoms.  No edema.  Will reassess echo just to ensure no change.

## 2018-06-26 NOTE — Assessment & Plan Note (Signed)
Last evaluation showed a controlled lipids.  Followed by PCP.

## 2018-06-26 NOTE — Assessment & Plan Note (Signed)
Quite hypertensive today, but has been fine as recently as his last visit with PCP. We will defer further treatment at this time since we are still evaluating the etiology for syncope. Would probably be more inclined to allow for mild permissive hypertension with a goal blood pressure in the 130-150 mmHg range.

## 2018-06-29 DIAGNOSIS — D49512 Neoplasm of unspecified behavior of left kidney: Secondary | ICD-10-CM | POA: Diagnosis not present

## 2018-06-29 DIAGNOSIS — Z8546 Personal history of malignant neoplasm of prostate: Secondary | ICD-10-CM | POA: Diagnosis not present

## 2018-06-30 ENCOUNTER — Other Ambulatory Visit: Payer: Self-pay

## 2018-06-30 ENCOUNTER — Ambulatory Visit (INDEPENDENT_AMBULATORY_CARE_PROVIDER_SITE_OTHER): Payer: Medicare Other

## 2018-06-30 ENCOUNTER — Ambulatory Visit (HOSPITAL_COMMUNITY): Payer: Medicare Other | Attending: Cardiology

## 2018-06-30 DIAGNOSIS — I071 Rheumatic tricuspid insufficiency: Secondary | ICD-10-CM | POA: Insufficient documentation

## 2018-06-30 DIAGNOSIS — R55 Syncope and collapse: Secondary | ICD-10-CM

## 2018-06-30 DIAGNOSIS — E119 Type 2 diabetes mellitus without complications: Secondary | ICD-10-CM | POA: Diagnosis not present

## 2018-06-30 DIAGNOSIS — E785 Hyperlipidemia, unspecified: Secondary | ICD-10-CM | POA: Diagnosis not present

## 2018-06-30 DIAGNOSIS — I110 Hypertensive heart disease with heart failure: Secondary | ICD-10-CM | POA: Insufficient documentation

## 2018-06-30 DIAGNOSIS — J449 Chronic obstructive pulmonary disease, unspecified: Secondary | ICD-10-CM | POA: Diagnosis not present

## 2018-06-30 DIAGNOSIS — I509 Heart failure, unspecified: Secondary | ICD-10-CM | POA: Insufficient documentation

## 2018-07-06 ENCOUNTER — Other Ambulatory Visit: Payer: Self-pay | Admitting: *Deleted

## 2018-07-06 ENCOUNTER — Ambulatory Visit (HOSPITAL_COMMUNITY)
Admission: RE | Admit: 2018-07-06 | Discharge: 2018-07-06 | Disposition: A | Discharge status: Home / Self Care | Payer: Medicare Other | Source: Ambulatory Visit | Attending: Cardiology | Admitting: Cardiology

## 2018-07-06 DIAGNOSIS — R55 Syncope and collapse: Secondary | ICD-10-CM

## 2018-07-06 DIAGNOSIS — I6522 Occlusion and stenosis of left carotid artery: Secondary | ICD-10-CM

## 2018-07-06 NOTE — Progress Notes (Signed)
Notes recorded by Minus Breeding, MD on 07/06/2018 at 5:25 PM EDT Right Carotid: Velocities in the right ICA are consistent with a 1-39% stenosis. Left Carotid: Velocities in the left ICA are consistent with a 40-59% stenosis.       Non-hemodynamically significant plaque noted in the CCA. Follow up in 12 months.  Call Mr. Bagdasarian with the results and send results to Colon Branch, MD   Ordered placed for 1 year follow up

## 2018-07-08 ENCOUNTER — Encounter: Payer: Self-pay | Admitting: *Deleted

## 2018-07-08 NOTE — Progress Notes (Signed)
Patient ID: Jim [name], male   DOB: Jun 28, 1932, 84 y.o.   MRN: [id number] Cardiac event monitor early termination notice received from [employer].  Patient had not transmitted for greater than 48 hours.  [employer] contacted patient.  Patient stated he had removed monitor due to skin irritation.  [employer] offer sending out pediatric electrode set up for sensitive skin.  Patient declined stating he was sending back the monitor kit to [employer].

## 2018-07-19 ENCOUNTER — Encounter (INDEPENDENT_AMBULATORY_CARE_PROVIDER_SITE_OTHER): Payer: Medicare Other | Admitting: Ophthalmology

## 2018-07-19 DIAGNOSIS — H35033 Hypertensive retinopathy, bilateral: Secondary | ICD-10-CM

## 2018-07-19 DIAGNOSIS — I10 Essential (primary) hypertension: Secondary | ICD-10-CM

## 2018-07-19 DIAGNOSIS — H33302 Unspecified retinal break, left eye: Secondary | ICD-10-CM | POA: Diagnosis not present

## 2018-07-19 DIAGNOSIS — H353231 Exudative age-related macular degeneration, bilateral, with active choroidal neovascularization: Secondary | ICD-10-CM

## 2018-07-19 DIAGNOSIS — H4313 Vitreous hemorrhage, bilateral: Secondary | ICD-10-CM | POA: Diagnosis not present

## 2018-08-30 ENCOUNTER — Encounter (INDEPENDENT_AMBULATORY_CARE_PROVIDER_SITE_OTHER): Payer: Medicare Other | Admitting: Ophthalmology

## 2018-08-30 DIAGNOSIS — H33302 Unspecified retinal break, left eye: Secondary | ICD-10-CM | POA: Diagnosis not present

## 2018-08-30 DIAGNOSIS — I10 Essential (primary) hypertension: Secondary | ICD-10-CM | POA: Diagnosis not present

## 2018-08-30 DIAGNOSIS — H353231 Exudative age-related macular degeneration, bilateral, with active choroidal neovascularization: Secondary | ICD-10-CM | POA: Diagnosis not present

## 2018-08-30 DIAGNOSIS — H35033 Hypertensive retinopathy, bilateral: Secondary | ICD-10-CM | POA: Diagnosis not present

## 2018-08-30 DIAGNOSIS — H43813 Vitreous degeneration, bilateral: Secondary | ICD-10-CM | POA: Diagnosis not present

## 2018-09-10 ENCOUNTER — Other Ambulatory Visit: Payer: Self-pay | Admitting: Internal Medicine

## 2018-09-23 ENCOUNTER — Ambulatory Visit (INDEPENDENT_AMBULATORY_CARE_PROVIDER_SITE_OTHER): Payer: Medicare Other | Admitting: Internal Medicine

## 2018-09-23 ENCOUNTER — Encounter: Payer: Self-pay | Admitting: Internal Medicine

## 2018-09-23 VITALS — BP 132/66 | HR 64 | Temp 98.4°F | Resp 16 | Ht 70.0 in | Wt 150.0 lb

## 2018-09-23 DIAGNOSIS — D649 Anemia, unspecified: Secondary | ICD-10-CM | POA: Diagnosis not present

## 2018-09-23 DIAGNOSIS — R55 Syncope and collapse: Secondary | ICD-10-CM | POA: Diagnosis not present

## 2018-09-23 DIAGNOSIS — I6523 Occlusion and stenosis of bilateral carotid arteries: Secondary | ICD-10-CM | POA: Diagnosis not present

## 2018-09-23 DIAGNOSIS — I10 Essential (primary) hypertension: Secondary | ICD-10-CM | POA: Diagnosis not present

## 2018-09-23 DIAGNOSIS — Z23 Encounter for immunization: Secondary | ICD-10-CM

## 2018-09-23 LAB — CBC WITH DIFFERENTIAL/PLATELET
Basophils Absolute: 0.1 10*3/uL (ref 0.0–0.1)
Basophils Relative: 1.3 % (ref 0.0–3.0)
Eosinophils Absolute: 0.4 10*3/uL (ref 0.0–0.7)
Eosinophils Relative: 5.4 % — ABNORMAL HIGH (ref 0.0–5.0)
HEMATOCRIT: 32.5 % — AB (ref 39.0–52.0)
HEMOGLOBIN: 11.4 g/dL — AB (ref 13.0–17.0)
LYMPHS PCT: 18.8 % (ref 12.0–46.0)
Lymphs Abs: 1.3 10*3/uL (ref 0.7–4.0)
MCHC: 35 g/dL (ref 30.0–36.0)
MCV: 92.2 fl (ref 78.0–100.0)
MONOS PCT: 8 % (ref 3.0–12.0)
Monocytes Absolute: 0.6 10*3/uL (ref 0.1–1.0)
Neutro Abs: 4.7 10*3/uL (ref 1.4–7.7)
Neutrophils Relative %: 66.5 % (ref 43.0–77.0)
PLATELETS: 205 10*3/uL (ref 150.0–400.0)
RBC: 3.52 Mil/uL — AB (ref 4.22–5.81)
RDW: 13.2 % (ref 11.5–15.5)
WBC: 7 10*3/uL (ref 4.0–10.5)

## 2018-09-23 LAB — BASIC METABOLIC PANEL
BUN: 19 mg/dL (ref 6–23)
CALCIUM: 9 mg/dL (ref 8.4–10.5)
CO2: 30 mEq/L (ref 19–32)
CREATININE: 1.35 mg/dL (ref 0.40–1.50)
Chloride: 101 mEq/L (ref 96–112)
GFR: 53.49 mL/min — AB (ref >60.00)
Glucose, Bld: 105 mg/dL — ABNORMAL HIGH (ref 70–99)
Potassium: 3.4 mEq/L — ABNORMAL LOW (ref 3.5–5.1)
SODIUM: 140 meq/L (ref 135–145)

## 2018-09-23 LAB — IRON: Iron: 84 ug/dL (ref 42–165)

## 2018-09-23 LAB — FERRITIN: FERRITIN: 221.9 ng/mL (ref 22.0–322.0)

## 2018-09-23 NOTE — Progress Notes (Signed)
Pre visit review using our clinic review tool, if applicable. No additional management support is needed unless otherwise documented below in the visit note. 

## 2018-09-23 NOTE — Patient Instructions (Signed)
GO TO THE LAB : Get the blood work     GO TO THE FRONT DESK Schedule your next appointment for a  Check up in 6 months    Check the  blood pressure  weekly  Be sure your blood pressure is between 110/65 and  135/85. If it is consistently higher or lower, let me know

## 2018-09-23 NOTE — Progress Notes (Signed)
Subjective:    Patient ID: Jim [name], male    DOB: 1932-09-14, 84 y.o.   MRN: [id number]  DOS:  09/23/2018 Type of visit - description : ROV Interval history: HTN: Reports good med compliance, no recent ambulatory BPs. Syncope: Cardiology note and results reviewed COPD: Reports good compliance with meds  Last note from urology reviewed   Review of Systems No fever chills No chest pain Breathing at baseline, hardly ever coughs. No nausea, vomiting, diarrhea  Past Medical History:  Diagnosis Date  . Cholestatic hepatitis 2014  . COPD (chronic obstructive pulmonary disease) ([place])   . ED (erectile dysfunction)   . Essential hypertension, benign   . Hyperlipidemia   . Personal history of colonic polyps   . Prostate cancer ([place]) 2008   s/p XRT  . Renal mass 08/12/2014  . Syncope 2010   Evaluated by Dr. Caryl Comes - normal LVEF, possibly neurally mediated  . Type 2 diabetes mellitus ([place]) 10/2009    Past Surgical History:  Procedure Laterality Date  . CATARACT EXTRACTION Bilateral   . CHOLECYSTECTOMY N/A 08/22/2014   Procedure: LAPAROSCOPIC CHOLECYSTECTOMY WITH INTRAOPERATIVE CHOLANGIOGRAM;  Surgeon: Gayland Curry, MD;  Location: [facility];  Service: General;  Laterality: N/A;  . ERCP N/A 08/16/2014   Procedure: ENDOSCOPIC RETROGRADE CHOLANGIOPANCREATOGRAPHY (ERCP);  Surgeon: Milus Banister, MD;  Location: [facility];  Service: Endoscopy;  Laterality: N/A;  . EYE SURGERY Left 07/17/2014  . TRANSTHORACIC ECHOCARDIOGRAM  07/2014   COPD exacerbation. -Mild LVH.  EF 50-55%. No RWMA. Gr 1 DD.  Aortic Sclerosis w/o Stenosis.     Social History   Socioeconomic History  . Marital status: Divorced    Spouse name: Not on file  . Number of children: 2  . Years of education: Not on file  . Highest education level: Not on file  Occupational History  . Occupation: Retired     Fish farm manager: RETIRED  Social Needs  . Financial resource strain: Not on file  . Food insecurity:    Worry: Not  on file    Inability: Not on file  . Transportation needs:    Medical: Not on file    Non-medical: Not on file  Tobacco Use  . Smoking status: Former Smoker    Packs/day: 2.00    Years: 40.00    Pack years: 80.00    Types: Cigarettes    Last attempt to quit: 11/24/2005    Years since quitting: 12.8  . Smokeless tobacco: Never Used  Substance and Sexual Activity  . Alcohol use: Yes    Comment: Rarely  . Drug use: No  . Sexual activity: Not Currently  Lifestyle  . Physical activity:    Days per week: Not on file    Minutes per session: Not on file  . Stress: Not on file  Relationships  . Social connections:    Talks on phone: Not on file    Gets together: Not on file    Attends religious service: Not on file    Active member of club or organization: Not on file    Attends meetings of clubs or organizations: Not on file    Relationship status: Not on file  . Intimate partner violence:    Fear of current or ex partner: Not on file    Emotionally abused: Not on file    Physically abused: Not on file    Forced sexual activity: Not on file  Other Topics Concern  . Not on file  Social History Narrative   Independent on ADL    Lives with son and daughter in law   Has Gk and GGk      Allergies as of 09/23/2018      Reactions   Amlodipine Besylate    REACTION: swelling   Cardura [doxazosin Mesylate] Itching   Clonidine Derivatives Itching   Fluticasone-salmeterol Hives, Itching   Nifedipine    Swelling, rash, itching      Medication List        Accurate as of 09/23/18 11:59 PM. Always use your most recent med list.          albuterol (2.5 MG/3ML) 0.083% nebulizer solution Commonly known as:  PROVENTIL Take 3 mLs (2.5 mg total) by nebulization 4 (four) times daily as needed.   aspirin 81 MG tablet Take 81 mg by mouth daily.   atorvastatin 20 MG tablet Commonly known as:  LIPITOR Take 0.5 tablets (10 mg total) by mouth daily.   BESIVANCE 0.6 %  Susp Generic drug:  Besifloxacin HCl Instill 1 drop into both eyes four times daily for 2 days after each monthly eye injection   carvedilol 25 MG tablet Commonly known as:  COREG Take 1 tablet (25 mg total) by mouth 2 (two) times daily with a meal.   furosemide 40 MG tablet Commonly known as:  LASIX Take 1 tablet (40 mg total) by mouth daily.   Ipratropium-Albuterol 20-100 MCG/ACT Aers respimat Commonly known as:  COMBIVENT inhale 1 puff by mouth every 6 hours if needed for shortness of breath or wheezing   losartan 100 MG tablet Commonly known as:  COZAAR Take 0.5 tablets (50 mg total) by mouth daily.   metFORMIN 500 MG tablet Commonly known as:  GLUCOPHAGE TAKE 2 TABLETS BY MOUTH EVERY MORNING AND 1 TABLET BY MOUTH EVERY EVENING   mometasone-formoterol 200-5 MCG/ACT Aero Commonly known as:  DULERA Inhale 2 puffs into the lungs 2 (two) times daily.   PRESERVISION AREDS PO Take 2 tablets by mouth daily.   silver sulfADIAZINE 1 % cream Commonly known as:  SILVADENE Apply 1 application topically daily.          Objective:   Physical Exam BP 132/66 (BP Location: Left Arm, Patient Position: Sitting, Cuff Size: Small)   Pulse 64   Temp 98.4 F (36.9 C) (Oral)   Resp 16   Ht 5\' 10"  (1.778 m)   Wt 150 lb (68 kg)   SpO2 97%   BMI 21.52 kg/m  General:   Well developed, NAD, see BMI.  HEENT:  Normocephalic . Face symmetric, atraumatic Lungs:  Creased breath sounds but clear Normal respiratory effort, no intercostal retractions, no accessory muscle use. Heart: RRR,  no murmur.  no pretibial edema bilaterally  Abdomen:  Not distended, soft, non-tender. No rebound or rigidity.   Skin: Not pale. Not jaundice Neurologic:  alert & oriented X3.  Speech normal, gait appropriate for age and unassisted Psych--  Cognition and judgment appear intact.  Cooperative with normal attention span and concentration.  Behavior appropriate. No anxious or depressed  appearing.     Assessment & Plan:   Assessment   DM HTN Hyperlipidemia COPD, Respiratory failure chronic HR CT chest   >>> 01/2016 , saw Dr Melvyn Novas; no f/u needed Renal mass 9- 2015,  sees urology  H/o Prostate cancer 2008 XRT H/o Cholestatic hepatitis- s/p ERCP 2014,  cholecystectomy 2015 Syncope 2010 , 2019 Carotid artery disease: 06/2018:  R ICA  1-39% L ICA are  consistent with a 40-59% stenosis f/u 2 yearsPLAN:  Plan: HTN: Good compliance with medications, BP was elevated at the time of the cardiology visit but is normal today.  No ambulatory BPs.  Check a BMP. COPD: Basically no symptoms.  Continue present care. Renal mass: Last urology visit 06-2018, follow-up in 6 months Syncope 05/2018: no further sxs. Saw cardiology, etiology was not clear, W/U including echo with normal EF (see full report), carotid ultrasound showed carotid disease .  also had a 30-day event monitor apparently with no major findings. Mild anemia: Noted on chart review, check a CBC, iron, ferritin Aleve was in the medication list, recommend to stop and take Tylenol as needed Flu shot today RTC 6 months

## 2018-09-26 DIAGNOSIS — I779 Disorder of arteries and arterioles, unspecified: Secondary | ICD-10-CM | POA: Insufficient documentation

## 2018-09-26 DIAGNOSIS — I739 Peripheral vascular disease, unspecified: Secondary | ICD-10-CM

## 2018-09-26 NOTE — Assessment & Plan Note (Signed)
HTN: Good compliance with medications, BP was elevated at the time of the cardiology visit but is normal today.  No ambulatory BPs.  Check a BMP. COPD: Basically no symptoms.  Continue present care. Renal mass: Last urology visit 06-2018, follow-up in 6 months Syncope 05/2018: no further sxs. Saw cardiology, etiology was not clear, W/U including echo with normal EF (see full report), carotid ultrasound showed carotid disease .  also had a 30-day event monitor apparently with no major findings. Mild anemia: Noted on chart review, check a CBC, iron, ferritin Aleve was in the medication list, recommend to stop and take Tylenol as needed Flu shot today RTC 6 months

## 2018-09-29 ENCOUNTER — Encounter: Payer: Self-pay | Admitting: Cardiology

## 2018-09-29 ENCOUNTER — Ambulatory Visit (INDEPENDENT_AMBULATORY_CARE_PROVIDER_SITE_OTHER): Payer: Medicare Other | Admitting: Cardiology

## 2018-09-29 VITALS — BP 126/65 | HR 74 | Ht 70.0 in | Wt 150.0 lb

## 2018-09-29 DIAGNOSIS — R55 Syncope and collapse: Secondary | ICD-10-CM

## 2018-09-29 DIAGNOSIS — I6522 Occlusion and stenosis of left carotid artery: Secondary | ICD-10-CM

## 2018-09-29 DIAGNOSIS — I6523 Occlusion and stenosis of bilateral carotid arteries: Secondary | ICD-10-CM

## 2018-09-29 DIAGNOSIS — I10 Essential (primary) hypertension: Secondary | ICD-10-CM

## 2018-09-29 NOTE — Patient Instructions (Signed)
Medication Instructions:  NOT NEED If you need a refill on your cardiac medications before your next appointment, please call your pharmacy.   Lab work:NOT NEED If you have labs (blood work) drawn today and your tests are completely normal, you will receive your results only by: Marland Kitchen MyChart Message (if you have MyChart) OR . A paper copy in the mail If you have any lab test that is abnormal or we need to change your treatment, we will call you to review the results.  Testing/Procedures: NOT NEED  Follow-Up: At [facility], you and your health needs are our priority.  As part of our continuing mission to provide you with exceptional heart care, we have created designated Provider Care Teams.  These Care Teams include your primary Cardiologist (physician) and Advanced Practice Providers (APPs -  Physician Assistants and Nurse Practitioners) who all work together to provide you with the care you need, when you need it. .  FOLLOW AS NEEDED  Any Other Special Instructions Will Be Listed Below (If Applicable).

## 2018-09-29 NOTE — Progress Notes (Signed)
PCP: Colon Branch, MD  Clinic Note: Chief Complaint  Patient presents with  . Follow-up    Study results  . Loss of Consciousness    No further episodes    HPI: Jim [name] is a 84 y.o. male with a PMH noted below who is being seen today for the evaluation of SYNCOPE at the request of Colon Branch, MD.  Pertinent PMH: COPD, HTH, DM-2, HLD -- prior evaluation for Syncope in 2010 (Dr. Caryl Comes) - thought to be Neurally mediated.  --July 2019 episode of syncope 1 day following tetanus shot.  Jim [name] was last seen on June 24, 2018 for this evaluation of syncope.  He was evaluated with echocardiogram, carotid Dopplers and monitor.  Recent Hospitalizations:  n/a  Studies Personally Reviewed - (if available, images/films reviewed: From Epic Chart or Care Everywhere)  Transthoracic Echo June 30, 2018: EF 50-55%. No RWMA. Gr 1 DD.  Aortic Sclerosis w/o Stenosis.  Mild TR -correlates with moderately elevated PA pressures (47 mmHg)  Event Monitor August 2018: Relatively normal.  No arrhythmias or significant ectopy.  Carotid Dopplers July 06, 2017: R ICA 1-39%.  LICA 01-77%.  Normal subclavian and vertebral arteries. -->  Recommend 60-month follow-up   Interval History: Jim [name] presents today basically discuss the results of his studies.  He has not had any further syncopal episodes.  He is not overly active, but does what he wants to do and has no major cardiac symptoms. No lightheadedness or dizziness or wooziness.  Again no syncope/near syncope or TIA/amaurosis fugax.  No PND, orthopnea or edema.  No chest tightness or pressure with rest or exertion.  No claudication.  Essentially negative cardiac symptoms.  He notes chronic exertional dyspnea from COPD.  No chest pain or pressure with rest or exertion  ROS: A comprehensive was performed. Review of Systems  Constitutional: Negative for chills, fever and malaise/fatigue.  HENT: Negative for congestion and nosebleeds.    Respiratory: Positive for cough (Morning,), shortness of breath and wheezing.        Baseline COPD.  Stable  Cardiovascular: Negative for palpitations.  Genitourinary: Negative for hematuria.  Musculoskeletal: Negative for falls and joint pain.  Neurological: Positive for dizziness (Sometimes positional). Negative for headaches.  Endo/Heme/Allergies: Negative for environmental allergies.    I have reviewed and (if needed) personally updated the patient's problem list, medications, allergies, past medical and surgical history, social and family history.   Past Medical History:  Diagnosis Date  . Cholestatic hepatitis 2014  . COPD (chronic obstructive pulmonary disease) ([place])   . ED (erectile dysfunction)   . Essential hypertension, benign   . Hyperlipidemia   . Personal history of colonic polyps   . Prostate cancer ([place]) 2008   s/p XRT  . Renal mass 08/12/2014  . Syncope 2010   Evaluated by Dr. Caryl Comes - normal LVEF, possibly neurally mediated  . Type 2 diabetes mellitus ([place]) 10/2009    Past Surgical History:  Procedure Laterality Date  . Carotid Dopplers  06/2018   R ICA 1-39%.  LICA 93-90%.  Normal subclavian and vertebral arteries. -->  Recommend 24-month follow-up  . CATARACT EXTRACTION Bilateral   . CHOLECYSTECTOMY N/A 08/22/2014   Procedure: LAPAROSCOPIC CHOLECYSTECTOMY WITH INTRAOPERATIVE CHOLANGIOGRAM;  Surgeon: Gayland Curry, MD;  Location: [facility];  Service: General;  Laterality: N/A;  . ERCP N/A 08/16/2014   Procedure: ENDOSCOPIC RETROGRADE CHOLANGIOPANCREATOGRAPHY (ERCP);  Surgeon: Milus Banister, MD;  Location: [facility];  Service: Endoscopy;  Laterality: N/A;  . EYE SURGERY Left 07/17/2014  . TRANSTHORACIC ECHOCARDIOGRAM  06/2018   EF 50-55%. No RWMA. Gr 1 DD.  Aortic Sclerosis w/o Stenosis.  Mild TR -correlates with moderately elevated PA pressures (47 mmHg)    Current Meds  Medication Sig  . albuterol (PROVENTIL) (2.5 MG/3ML) 0.083% nebulizer solution Take 3 mLs  (2.5 mg total) by nebulization 4 (four) times daily as needed. (Patient taking differently: Take 2.5 mg by nebulization 4 (four) times daily as needed for wheezing or shortness of breath. )  . aspirin 81 MG tablet Take 81 mg by mouth daily.    Marland Kitchen atorvastatin (LIPITOR) 20 MG tablet Take 0.5 tablets (10 mg total) by mouth daily.  Marland Kitchen BESIVANCE 0.6 % SUSP Instill 1 drop into both eyes four times daily for 2 days after each monthly eye injection  . carvedilol (COREG) 25 MG tablet Take 1 tablet (25 mg total) by mouth 2 (two) times daily with a meal.  . furosemide (LASIX) 40 MG tablet Take 1 tablet (40 mg total) by mouth daily.  . Ipratropium-Albuterol (COMBIVENT RESPIMAT) 20-100 MCG/ACT AERS respimat inhale 1 puff by mouth every 6 hours if needed for shortness of breath or wheezing  . losartan (COZAAR) 100 MG tablet Take 0.5 tablets (50 mg total) by mouth daily.  . metFORMIN (GLUCOPHAGE) 500 MG tablet TAKE 2 TABLETS BY MOUTH EVERY MORNING AND 1 TABLET BY MOUTH EVERY EVENING  . mometasone-formoterol (DULERA) 200-5 MCG/ACT AERO Inhale 2 puffs into the lungs 2 (two) times daily.  . Multiple Vitamins-Minerals (PRESERVISION AREDS PO) Take 2 tablets by mouth daily.      Allergies  Allergen Reactions  . Amlodipine Besylate     REACTION: swelling  . Cardura [Doxazosin Mesylate] Itching  . Clonidine Derivatives Itching  . Fluticasone-Salmeterol Hives and Itching  . Nifedipine     Swelling, rash, itching    Social History   Tobacco Use  . Smoking status: Former Smoker    Packs/day: 2.00    Years: 40.00    Pack years: 80.00    Types: Cigarettes    Last attempt to quit: 11/24/2005    Years since quitting: 12.8  . Smokeless tobacco: Never Used  Substance Use Topics  . Alcohol use: Yes    Comment: Rarely  . Drug use: No   Social History   Social History Narrative   Independent on ADL    Lives with son and daughter in law   Has Gk and GGk    family history includes Cirrhosis in his brother;  Dementia in his mother.  Wt Readings from Last 3 Encounters:  09/29/18 150 lb (68 kg)  09/23/18 150 lb (68 kg)  06/24/18 156 lb 12.8 oz (71.1 kg)    PHYSICAL EXAM BP 126/65   Pulse 74   Ht 5\' 10"  (1.778 m)   Wt 150 lb (68 kg)   BMI 21.52 kg/m  Physical Exam  Constitutional: He is oriented to person, place, and time. He appears well-developed and well-nourished. No distress.  Quite healthy for stated age.  HENT:  Head: Normocephalic and atraumatic.  Neck: Normal range of motion. Neck supple. No JVD present. Carotid bruit is not present.  Cardiovascular: Normal rate, regular rhythm, intact distal pulses and normal pulses.  No extrasystoles are present. Exam reveals no gallop and no friction rub.  Murmur heard.  Harsh crescendo-decrescendo midsystolic murmur is present with a grade of 2/6 at the upper right sternal border radiating to the neck. Pulmonary/Chest: Effort  normal and breath sounds normal. No respiratory distress. He has no wheezes. He has no rales.  Musculoskeletal: Normal range of motion.  Neurological: He is alert and oriented to person, place, and time.  Psychiatric: He has a normal mood and affect. His behavior is normal. Judgment and thought content normal.  Vitals reviewed.    Adult ECG Report  Other studies Reviewed: Additional studies/ records that were reviewed today include:  Recent Labs: No new labs   ASSESSMENT / PLAN: Problem List Items Addressed This Visit    Carotid arterial disease (HCC) (Chronic)    Moderate left internal carotid artery on Dopplers.  Would recommend annual Doppler follow-up.  Will defer to PCP.  If levels get significant, would then need referral to vascular surgery.      Essential hypertension (Chronic)    Blood pressure looks much better today.  Continue current dose of carvedilol and losartan.  Avoid overhead diuresis Lasix.  As noted in last visit, would allow for some mild permissive hypertension.  Would not further  titrate medicines unless he is profoundly hypertensive.      Syncope - Primary    No further episodes.  Negative cardiac evaluation.  Likely noncardiac etiology as noted before.  Probably vagal/vasovagal syncope.  Continue to recommend adequate hydration. He did have moderate carotid disease.  Will need annual carotid Dopplers which can be ordered by PCP for follow-up.         I spent a total of 20 minutes with the patient and chart review. >  50% of the time was spent in direct patient consultation.   Current medicines are reviewed at length with the patient today.  (+/- concerns) none The following changes have been made:  none  Patient Instructions  Medication Instructions:  NOT NEED If you need a refill on your cardiac medications before your next appointment, please call your pharmacy.   Lab work:NOT NEED If you have labs (blood work) drawn today and your tests are completely normal, you will receive your results only by: Marland Kitchen MyChart Message (if you have MyChart) OR . A paper copy in the mail If you have any lab test that is abnormal or we need to change your treatment, we will call you to review the results.  Testing/Procedures: NOT NEED  Follow-Up: At [facility], you and your health needs are our priority.  As part of our continuing mission to provide you with exceptional heart care, we have created designated Provider Care Teams.  These Care Teams include your primary Cardiologist (physician) and Advanced Practice Providers (APPs -  Physician Assistants and Nurse Practitioners) who all work together to provide you with the care you need, when you need it. .  FOLLOW AS NEEDED  Any Other Special Instructions Will Be Listed Below (If Applicable).     Studies Ordered:   No orders of the defined types were placed in this encounter.     Glenetta Hew, M.D., M.S. Interventional Cardiologist   Pager # [phone] Phone # [phone] [address] [place], [place] [address]   Thank you for choosing Heartcare at [facility]!!

## 2018-10-01 ENCOUNTER — Encounter: Payer: Self-pay | Admitting: Cardiology

## 2018-10-01 NOTE — Assessment & Plan Note (Signed)
Moderate left internal carotid artery on Dopplers.  Would recommend annual Doppler follow-up.  Will defer to PCP.  If levels get significant, would then need referral to vascular surgery.

## 2018-10-01 NOTE — Assessment & Plan Note (Signed)
No further episodes.  Negative cardiac evaluation.  Likely noncardiac etiology as noted before.  Probably vagal/vasovagal syncope.  Continue to recommend adequate hydration. He did have moderate carotid disease.  Will need annual carotid Dopplers which can be ordered by PCP for follow-up.

## 2018-10-01 NOTE — Assessment & Plan Note (Signed)
Blood pressure looks much better today.  Continue current dose of carvedilol and losartan.  Avoid overhead diuresis Lasix.  As noted in last visit, would allow for some mild permissive hypertension.  Would not further titrate medicines unless he is profoundly hypertensive.

## 2018-10-11 ENCOUNTER — Encounter (INDEPENDENT_AMBULATORY_CARE_PROVIDER_SITE_OTHER): Payer: Medicare Other | Admitting: Ophthalmology

## 2018-10-11 DIAGNOSIS — I10 Essential (primary) hypertension: Secondary | ICD-10-CM | POA: Diagnosis not present

## 2018-10-11 DIAGNOSIS — H43813 Vitreous degeneration, bilateral: Secondary | ICD-10-CM

## 2018-10-11 DIAGNOSIS — H35033 Hypertensive retinopathy, bilateral: Secondary | ICD-10-CM

## 2018-10-11 DIAGNOSIS — H353231 Exudative age-related macular degeneration, bilateral, with active choroidal neovascularization: Secondary | ICD-10-CM

## 2018-10-11 DIAGNOSIS — H33302 Unspecified retinal break, left eye: Secondary | ICD-10-CM | POA: Diagnosis not present

## 2018-11-06 ENCOUNTER — Other Ambulatory Visit: Payer: Self-pay | Admitting: Internal Medicine

## 2018-11-12 ENCOUNTER — Encounter (INDEPENDENT_AMBULATORY_CARE_PROVIDER_SITE_OTHER): Payer: Medicare Other | Admitting: Ophthalmology

## 2018-11-12 DIAGNOSIS — H33302 Unspecified retinal break, left eye: Secondary | ICD-10-CM | POA: Diagnosis not present

## 2018-11-12 DIAGNOSIS — I10 Essential (primary) hypertension: Secondary | ICD-10-CM | POA: Diagnosis not present

## 2018-11-12 DIAGNOSIS — H43813 Vitreous degeneration, bilateral: Secondary | ICD-10-CM | POA: Diagnosis not present

## 2018-11-12 DIAGNOSIS — H353231 Exudative age-related macular degeneration, bilateral, with active choroidal neovascularization: Secondary | ICD-10-CM

## 2018-11-12 DIAGNOSIS — H35033 Hypertensive retinopathy, bilateral: Secondary | ICD-10-CM

## 2018-12-15 ENCOUNTER — Other Ambulatory Visit: Payer: Self-pay | Admitting: Internal Medicine

## 2018-12-24 ENCOUNTER — Encounter (INDEPENDENT_AMBULATORY_CARE_PROVIDER_SITE_OTHER): Payer: Medicare Other | Admitting: Ophthalmology

## 2018-12-30 ENCOUNTER — Encounter (INDEPENDENT_AMBULATORY_CARE_PROVIDER_SITE_OTHER): Payer: Medicare Other | Admitting: Ophthalmology

## 2018-12-30 DIAGNOSIS — H353231 Exudative age-related macular degeneration, bilateral, with active choroidal neovascularization: Secondary | ICD-10-CM | POA: Diagnosis not present

## 2018-12-30 DIAGNOSIS — I10 Essential (primary) hypertension: Secondary | ICD-10-CM | POA: Diagnosis not present

## 2018-12-30 DIAGNOSIS — H33302 Unspecified retinal break, left eye: Secondary | ICD-10-CM | POA: Diagnosis not present

## 2018-12-30 DIAGNOSIS — H35033 Hypertensive retinopathy, bilateral: Secondary | ICD-10-CM | POA: Diagnosis not present

## 2018-12-30 DIAGNOSIS — H43813 Vitreous degeneration, bilateral: Secondary | ICD-10-CM

## 2019-01-03 DIAGNOSIS — Z8546 Personal history of malignant neoplasm of prostate: Secondary | ICD-10-CM | POA: Diagnosis not present

## 2019-01-03 DIAGNOSIS — D49512 Neoplasm of unspecified behavior of left kidney: Secondary | ICD-10-CM | POA: Diagnosis not present

## 2019-02-09 ENCOUNTER — Encounter (INDEPENDENT_AMBULATORY_CARE_PROVIDER_SITE_OTHER): Payer: Medicare Other | Admitting: Ophthalmology

## 2019-02-19 ENCOUNTER — Other Ambulatory Visit: Payer: Self-pay | Admitting: Internal Medicine

## 2019-03-21 ENCOUNTER — Other Ambulatory Visit: Payer: Self-pay | Admitting: Internal Medicine

## 2019-03-29 ENCOUNTER — Other Ambulatory Visit: Payer: Self-pay

## 2019-03-29 ENCOUNTER — Encounter: Payer: Self-pay | Admitting: Internal Medicine

## 2019-03-29 ENCOUNTER — Ambulatory Visit (INDEPENDENT_AMBULATORY_CARE_PROVIDER_SITE_OTHER): Payer: Medicare Other | Admitting: Internal Medicine

## 2019-03-29 DIAGNOSIS — E119 Type 2 diabetes mellitus without complications: Secondary | ICD-10-CM

## 2019-03-29 DIAGNOSIS — D649 Anemia, unspecified: Secondary | ICD-10-CM

## 2019-03-29 DIAGNOSIS — E782 Mixed hyperlipidemia: Secondary | ICD-10-CM

## 2019-03-29 DIAGNOSIS — I10 Essential (primary) hypertension: Secondary | ICD-10-CM

## 2019-03-29 NOTE — Progress Notes (Signed)
Subjective:    Patient ID: Jim [name], male    DOB: 1932/07/13, 85 y.o.   MRN: [id number]  DOS:  03/29/2019 Type of visit - description: Attempted  to make this a video visit, due to technical difficulties from the patient side it was not possible  thus we proceeded with a Virtual Visit via Telephone    I connected with@ on 03/30/19 at 10:00 AM EDT by telephone and verified that I am speaking with the correct person using two identifiers.  THIS ENCOUNTER IS A VIRTUAL VISIT DUE TO COVID-19 - PATIENT WAS NOT SEEN IN THE OFFICE. PATIENT HAS CONSENTED TO VIRTUAL VISIT / TELEMEDICINE VISIT   Location of patient: home  Location of provider: office  I discussed the limitations, risks, security and privacy concerns of performing an evaluation and management service by telephone and the availability of in person appointments. I also discussed with the patient that there may be a patient responsible charge related to this service. The patient expressed understanding and agreed to proceed.   History of Present Illness: Routine office visit DM: Good med compliance, no ambulatory CBGs HTN: Good med compliance, ambulatory BPs are typically within range. History of prostate cancer, urology note reviewed. History of syncope: Cardiology note reviewed. COVID-19: Following precautions, does not venture out his house often   Review of Systems Denies fever or chills No nausea, vomiting or diarrhea Minimal cough without sputum production or wheezing  Past Medical History:  Diagnosis Date  . Cholestatic hepatitis 2014  . COPD (chronic obstructive pulmonary disease) ([place])   . ED (erectile dysfunction)   . Essential hypertension, benign   . Hyperlipidemia   . Personal history of colonic polyps   . Prostate cancer ([place]) 2008   s/p XRT  . Renal mass 08/12/2014  . Syncope 2010   Evaluated by Dr. Caryl Comes - normal LVEF, possibly neurally mediated  . Type 2 diabetes mellitus ([place]) 10/2009     Past Surgical History:  Procedure Laterality Date  . Carotid Dopplers  06/2018   R ICA 1-39%.  LICA 69-67%.  Normal subclavian and vertebral arteries. -->  Recommend 8-month follow-up  . CATARACT EXTRACTION Bilateral   . CHOLECYSTECTOMY N/A 08/22/2014   Procedure: LAPAROSCOPIC CHOLECYSTECTOMY WITH INTRAOPERATIVE CHOLANGIOGRAM;  Surgeon: Gayland Curry, MD;  Location: [facility];  Service: General;  Laterality: N/A;  . ERCP N/A 08/16/2014   Procedure: ENDOSCOPIC RETROGRADE CHOLANGIOPANCREATOGRAPHY (ERCP);  Surgeon: Milus Banister, MD;  Location: [facility];  Service: Endoscopy;  Laterality: N/A;  . EYE SURGERY Left 07/17/2014  . TRANSTHORACIC ECHOCARDIOGRAM  06/2018   EF 50-55%. No RWMA. Gr 1 DD.  Aortic Sclerosis w/o Stenosis.  Mild TR -correlates with moderately elevated PA pressures (47 mmHg)    Social History   Socioeconomic History  . Marital status: Divorced    Spouse name: Not on file  . Number of children: 2  . Years of education: Not on file  . Highest education level: Not on file  Occupational History  . Occupation: Retired     Fish farm manager: RETIRED  Social Needs  . Financial resource strain: Not on file  . Food insecurity:    Worry: Not on file    Inability: Not on file  . Transportation needs:    Medical: Not on file    Non-medical: Not on file  Tobacco Use  . Smoking status: Former Smoker    Packs/day: 2.00    Years: 40.00    Pack years: 80.00  Types: Cigarettes    Last attempt to quit: 11/24/2005    Years since quitting: 13.3  . Smokeless tobacco: Never Used  Substance and Sexual Activity  . Alcohol use: Yes    Comment: Rarely  . Drug use: No  . Sexual activity: Not Currently  Lifestyle  . Physical activity:    Days per week: Not on file    Minutes per session: Not on file  . Stress: Not on file  Relationships  . Social connections:    Talks on phone: Not on file    Gets together: Not on file    Attends religious service: Not on file    Active member of club  or organization: Not on file    Attends meetings of clubs or organizations: Not on file    Relationship status: Not on file  . Intimate partner violence:    Fear of current or ex partner: Not on file    Emotionally abused: Not on file    Physically abused: Not on file    Forced sexual activity: Not on file  Other Topics Concern  . Not on file  Social History Narrative   Independent on ADL    Lives with son and daughter in law   Has Gk and GGk      Allergies as of 03/29/2019      Reactions   Amlodipine Besylate    REACTION: swelling   Cardura [doxazosin Mesylate] Itching   Clonidine Derivatives Itching   Fluticasone-salmeterol Hives, Itching   Nifedipine    Swelling, rash, itching      Medication List       Accurate as of Mar 29, 2019 11:59 PM. Always use your most recent med list.        albuterol (2.5 MG/3ML) 0.083% nebulizer solution Commonly known as:  PROVENTIL Take 3 mLs (2.5 mg total) by nebulization 4 (four) times daily as needed.   aspirin 81 MG tablet Take 81 mg by mouth daily.   atorvastatin 20 MG tablet Commonly known as:  LIPITOR Take 0.5 tablets (10 mg total) by mouth daily.   Besivance 0.6 % Susp Generic drug:  Besifloxacin HCl Instill 1 drop into both eyes four times daily for 2 days after each monthly eye injection   carvedilol 25 MG tablet Commonly known as:  COREG Take 1 tablet (25 mg total) by mouth 2 (two) times daily with a meal.   furosemide 40 MG tablet Commonly known as:  LASIX Take 1 tablet (40 mg total) by mouth daily.   Ipratropium-Albuterol 20-100 MCG/ACT Aers respimat Commonly known as:  Combivent Respimat inhale 1 puff by mouth every 6 hours if needed for shortness of breath or wheezing   losartan 100 MG tablet Commonly known as:  COZAAR Take 0.5 tablets (50 mg total) by mouth daily.   metFORMIN 500 MG tablet Commonly known as:  GLUCOPHAGE TAKE 2 TABLETS BY MOUTH EVERY MORNING AND 1 TABLET BY MOUTH EVERY EVENING    mometasone-formoterol 200-5 MCG/ACT Aero Commonly known as:  Dulera Inhale 2 puffs into the lungs 2 (two) times daily.   PRESERVISION AREDS PO Take 2 tablets by mouth daily.           Objective:   Physical Exam There were no vitals taken for this visit. This was a virtual visit over the phone.  The patient is alert oriented x3, he sounds relaxed, speaking in complete sentences, no distress    Assessment    Assessment   DM  HTN Hyperlipidemia COPD, Respiratory failure chronic HR CT chest   >>> 01/2016 , saw Dr Melvyn Novas; no f/u needed Urology: -Renal mass 9- 2015,  sees urology  #H/o Prostate cancer 2008 XRT H/o Cholestatic hepatitis- s/p ERCP 2014,  cholecystectomy 2015 Syncope 2010 , 2019.  Negative cardiac evaluation as of 09-2018. Carotid artery disease: 06/2018:  R ICA  1-39% L ICA are consistent with a 40-59% stenosis f/u 1 years   PLAN: DM: Currently on metformin, check a A1c HTN: Ambulatory BP is typically between 126 and 148.  Occasionally is slightly higher, majority of readings are very good.  Continue carvedilol, Lasix, losartan.  Check BMP High cholesterol: On Lipitor:, Check a FLP Mild anemia noted: Check a CBC. Syncope: Seen by cardiology 09-2018, was rx to f/u w/ them as needed. Carotid artery disease: Saw cardiology 09-2018, they recommend follow-up by PCP with yearly Dopplers.  Next Doppler 06/2019 Prostate cancer, renal mass: Last visit with urology Dr. Karsten Ro 12-2018, felt to be stable We doing labs this week versus wait till next visit, we agreed to proceed with labs  Plan: Schedule labs this week, follow-up 4 months.    I discussed the assessment and treatment plan with the patient. The patient was provided an opportunity to ask questions and all were answered. The patient agreed with the plan and demonstrated an understanding of the instructions.   The patient was advised to call back or seek an in-person evaluation if the symptoms worsen or if the  condition fails to improve as anticipated.  I provided 25 minutes of non-face-to-face time during this encounter.  [name], MD

## 2019-03-30 NOTE — Assessment & Plan Note (Signed)
DM: Currently on metformin, check a A1c HTN: Ambulatory BP is typically between 126 and 148.  Occasionally is slightly higher, majority of readings are very good.  Continue carvedilol, Lasix, losartan.  Check BMP High cholesterol: On Lipitor:, Check a FLP Mild anemia noted: Check a CBC. Syncope: Seen by cardiology 09-2018, was rx to f/u w/ them as needed. Carotid artery disease: Saw cardiology 09-2018, they recommend follow-up by PCP with yearly Dopplers.  Next Doppler 06/2019 Prostate cancer, renal mass: Last visit with urology Dr. Karsten Ro 12-2018, felt to be stable We doing labs this week versus wait till next visit, we agreed to proceed with labs  Plan: Schedule labs this week, follow-up 4 months.

## 2019-04-01 ENCOUNTER — Other Ambulatory Visit: Payer: Self-pay

## 2019-04-01 ENCOUNTER — Other Ambulatory Visit (INDEPENDENT_AMBULATORY_CARE_PROVIDER_SITE_OTHER): Payer: Medicare Other

## 2019-04-01 DIAGNOSIS — D649 Anemia, unspecified: Secondary | ICD-10-CM | POA: Diagnosis not present

## 2019-04-01 DIAGNOSIS — E119 Type 2 diabetes mellitus without complications: Secondary | ICD-10-CM | POA: Diagnosis not present

## 2019-04-01 DIAGNOSIS — E782 Mixed hyperlipidemia: Secondary | ICD-10-CM

## 2019-04-01 DIAGNOSIS — I10 Essential (primary) hypertension: Secondary | ICD-10-CM

## 2019-04-01 LAB — HEMOGLOBIN A1C: Hgb A1c MFr Bld: 5.9 % (ref 4.6–6.5)

## 2019-04-01 LAB — CBC WITH DIFFERENTIAL/PLATELET
Basophils Absolute: 0.1 10*3/uL (ref 0.0–0.1)
Basophils Relative: 1.4 % (ref 0.0–3.0)
Eosinophils Absolute: 0.4 10*3/uL (ref 0.0–0.7)
Eosinophils Relative: 5.9 % — ABNORMAL HIGH (ref 0.0–5.0)
HCT: 34.1 % — ABNORMAL LOW (ref 39.0–52.0)
Hemoglobin: 11.9 g/dL — ABNORMAL LOW (ref 13.0–17.0)
Lymphocytes Relative: 18.9 % (ref 12.0–46.0)
Lymphs Abs: 1.4 10*3/uL (ref 0.7–4.0)
MCHC: 35.1 g/dL (ref 30.0–36.0)
MCV: 91.6 fl (ref 78.0–100.0)
Monocytes Absolute: 0.6 10*3/uL (ref 0.1–1.0)
Monocytes Relative: 8 % (ref 3.0–12.0)
Neutro Abs: 4.9 10*3/uL (ref 1.4–7.7)
Neutrophils Relative %: 65.8 % (ref 43.0–77.0)
Platelets: 216 10*3/uL (ref 150.0–400.0)
RBC: 3.72 Mil/uL — ABNORMAL LOW (ref 4.22–5.81)
RDW: 14.1 % (ref 11.5–15.5)
WBC: 7.4 10*3/uL (ref 4.0–10.5)

## 2019-04-01 LAB — BASIC METABOLIC PANEL
BUN: 24 mg/dL — ABNORMAL HIGH (ref 6–23)
CO2: 29 mEq/L (ref 19–32)
Calcium: 9 mg/dL (ref 8.4–10.5)
Chloride: 100 mEq/L (ref 96–112)
Creatinine, Ser: 1.49 mg/dL (ref 0.40–1.50)
GFR: 44.86 mL/min — ABNORMAL LOW (ref >60.00)
Glucose, Bld: 108 mg/dL — ABNORMAL HIGH (ref 70–99)
Potassium: 5 mEq/L (ref 3.5–5.1)
Sodium: 138 mEq/L (ref 135–145)

## 2019-04-01 LAB — LIPID PANEL
Cholesterol: 115 mg/dL (ref 0–200)
HDL: 42.6 mg/dL (ref >39.00)
LDL Cholesterol: 50 mg/dL (ref 0–99)
NonHDL: 72.79
Total CHOL/HDL Ratio: 3
Triglycerides: 115 mg/dL (ref 0.0–149.0)
VLDL: 23 mg/dL (ref 0.0–40.0)

## 2019-04-14 ENCOUNTER — Other Ambulatory Visit: Payer: Self-pay | Admitting: Internal Medicine

## 2019-04-20 ENCOUNTER — Other Ambulatory Visit: Payer: Self-pay | Admitting: Internal Medicine

## 2019-06-16 ENCOUNTER — Other Ambulatory Visit: Payer: Self-pay | Admitting: Internal Medicine

## 2019-07-06 DIAGNOSIS — Z8546 Personal history of malignant neoplasm of prostate: Secondary | ICD-10-CM | POA: Diagnosis not present

## 2019-07-08 ENCOUNTER — Other Ambulatory Visit: Payer: Self-pay

## 2019-07-08 ENCOUNTER — Other Ambulatory Visit (HOSPITAL_COMMUNITY): Payer: Self-pay | Admitting: Cardiology

## 2019-07-08 ENCOUNTER — Ambulatory Visit (HOSPITAL_COMMUNITY)
Admission: RE | Admit: 2019-07-08 | Discharge: 2019-07-08 | Disposition: A | Discharge status: Home / Self Care | Payer: Medicare Other | Source: Ambulatory Visit | Attending: Cardiology | Admitting: Cardiology

## 2019-07-08 DIAGNOSIS — R55 Syncope and collapse: Secondary | ICD-10-CM | POA: Diagnosis not present

## 2019-07-08 DIAGNOSIS — I6522 Occlusion and stenosis of left carotid artery: Secondary | ICD-10-CM | POA: Insufficient documentation

## 2019-07-08 DIAGNOSIS — I6523 Occlusion and stenosis of bilateral carotid arteries: Secondary | ICD-10-CM

## 2019-07-12 DIAGNOSIS — Z8546 Personal history of malignant neoplasm of prostate: Secondary | ICD-10-CM | POA: Diagnosis not present

## 2019-07-12 DIAGNOSIS — D49512 Neoplasm of unspecified behavior of left kidney: Secondary | ICD-10-CM | POA: Diagnosis not present

## 2019-07-20 ENCOUNTER — Other Ambulatory Visit: Payer: Self-pay | Admitting: Internal Medicine

## 2019-08-02 ENCOUNTER — Encounter: Payer: Self-pay | Admitting: Internal Medicine

## 2019-08-02 ENCOUNTER — Ambulatory Visit (INDEPENDENT_AMBULATORY_CARE_PROVIDER_SITE_OTHER): Payer: Medicare Other | Admitting: Internal Medicine

## 2019-08-02 ENCOUNTER — Other Ambulatory Visit: Payer: Self-pay

## 2019-08-02 VITALS — BP 167/81 | HR 62 | Temp 97.0°F | Resp 16 | Ht 70.0 in | Wt 145.2 lb

## 2019-08-02 DIAGNOSIS — E119 Type 2 diabetes mellitus without complications: Secondary | ICD-10-CM | POA: Diagnosis not present

## 2019-08-02 DIAGNOSIS — I10 Essential (primary) hypertension: Secondary | ICD-10-CM | POA: Diagnosis not present

## 2019-08-02 DIAGNOSIS — E782 Mixed hyperlipidemia: Secondary | ICD-10-CM | POA: Diagnosis not present

## 2019-08-02 DIAGNOSIS — Z23 Encounter for immunization: Secondary | ICD-10-CM | POA: Diagnosis not present

## 2019-08-02 DIAGNOSIS — I6523 Occlusion and stenosis of bilateral carotid arteries: Secondary | ICD-10-CM

## 2019-08-02 LAB — BASIC METABOLIC PANEL
BUN: 18 mg/dL (ref 6–23)
CO2: 30 mEq/L (ref 19–32)
Calcium: 9.2 mg/dL (ref 8.4–10.5)
Chloride: 101 mEq/L (ref 96–112)
Creatinine, Ser: 1.33 mg/dL (ref 0.40–1.50)
GFR: 51.1 mL/min — ABNORMAL LOW (ref >60.00)
Glucose, Bld: 99 mg/dL (ref 70–99)
Potassium: 3.9 mEq/L (ref 3.5–5.1)
Sodium: 141 mEq/L (ref 135–145)

## 2019-08-02 LAB — HEMOGLOBIN A1C: Hgb A1c MFr Bld: 5.8 % (ref 4.6–6.5)

## 2019-08-02 NOTE — Progress Notes (Signed)
Subjective:    Patient ID: Jim [name], male    DOB: [birth year], 85 y.o.   MRN: [id number]  DOS:  08/02/2019 Type of visit - description: ROV DM:  No amb CBGs HTN: good med compliance, amb BP varies from wnl to slt elevated  Carotid artery dz: last results reviewed  BP Readings from Last 3 Encounters:  08/02/19 (!) 167/81  09/29/18 126/65  09/23/18 132/66     Review of Systems  Denies chest pain no difficulty breathing No nausea, vomiting, diarrhea. States he feels good.  Past Medical History:  Diagnosis Date  . Cholestatic hepatitis 2014  . COPD (chronic obstructive pulmonary disease) ([place])   . ED (erectile dysfunction)   . Essential hypertension, benign   . Hyperlipidemia   . Personal history of colonic polyps   . Prostate cancer ([place]) 2008   s/p XRT  . Renal mass 08/12/2014  . Syncope 2010   Evaluated by Dr. Caryl Comes - normal LVEF, possibly neurally mediated  . Type 2 diabetes mellitus ([place]) 10/2009    Past Surgical History:  Procedure Laterality Date  . Carotid Dopplers  06/2018   R ICA 1-39%.  LICA 123456.  Normal subclavian and vertebral arteries. -->  Recommend 57-month follow-up  . CATARACT EXTRACTION Bilateral   . CHOLECYSTECTOMY N/A 08/22/2014   Procedure: LAPAROSCOPIC CHOLECYSTECTOMY WITH INTRAOPERATIVE CHOLANGIOGRAM;  Surgeon: Gayland Curry, MD;  Location: [facility];  Service: General;  Laterality: N/A;  . ERCP N/A 08/16/2014   Procedure: ENDOSCOPIC RETROGRADE CHOLANGIOPANCREATOGRAPHY (ERCP);  Surgeon: Milus Banister, MD;  Location: [facility];  Service: Endoscopy;  Laterality: N/A;  . EYE SURGERY Left 07/17/2014  . TRANSTHORACIC ECHOCARDIOGRAM  06/2018   EF 50-55%. No RWMA. Gr 1 DD.  Aortic Sclerosis w/o Stenosis.  Mild TR -correlates with moderately elevated PA pressures (47 mmHg)    Social History   Socioeconomic History  . Marital status: Divorced    Spouse name: Not on file  . Number of children: 2  . Years of education: Not on file  . Highest  education level: Not on file  Occupational History  . Occupation: Retired     Fish farm manager: RETIRED  Social Needs  . Financial resource strain: Not on file  . Food insecurity    Worry: Not on file    Inability: Not on file  . Transportation needs    Medical: Not on file    Non-medical: Not on file  Tobacco Use  . Smoking status: Former Smoker    Packs/day: 2.00    Years: 40.00    Pack years: 80.00    Types: Cigarettes    Quit date: 11/24/2005    Years since quitting: 13.6  . Smokeless tobacco: Never Used  Substance and Sexual Activity  . Alcohol use: Yes    Comment: Rarely  . Drug use: No  . Sexual activity: Not Currently  Lifestyle  . Physical activity    Days per week: Not on file    Minutes per session: Not on file  . Stress: Not on file  Relationships  . Social Herbalist on phone: Not on file    Gets together: Not on file    Attends religious service: Not on file    Active member of club or organization: Not on file    Attends meetings of clubs or organizations: Not on file    Relationship status: Not on file  . Intimate partner violence    Fear of current or  ex partner: Not on file    Emotionally abused: Not on file    Physically abused: Not on file    Forced sexual activity: Not on file  Other Topics Concern  . Not on file  Social History Narrative   Independent on ADL    Lives with son and daughter in law   Has Gk and GGk      Allergies as of 08/02/2019      Reactions   Amlodipine Besylate    REACTION: swelling   Cardura [doxazosin Mesylate] Itching   Clonidine Derivatives Itching   Fluticasone-salmeterol Hives, Itching   Nifedipine    Swelling, rash, itching      Medication List       Accurate as of August 02, 2019  8:52 AM. If you have any questions, ask your nurse or doctor.        albuterol (2.5 MG/3ML) 0.083% nebulizer solution Commonly known as: PROVENTIL Take 3 mLs (2.5 mg total) by nebulization 4 (four) times daily as  needed. What changed: reasons to take this   aspirin 81 MG tablet Take 81 mg by mouth daily.   atorvastatin 20 MG tablet Commonly known as: LIPITOR Take 0.5 tablets (10 mg total) by mouth daily.   Besivance 0.6 % Susp Generic drug: Besifloxacin HCl Instill 1 drop into both eyes four times daily for 2 days after each monthly eye injection   carvedilol 25 MG tablet Commonly known as: COREG Take 1 tablet (25 mg total) by mouth 2 (two) times daily with a meal.   Combivent Respimat 20-100 MCG/ACT Aers respimat Generic drug: Ipratropium-Albuterol Inhale 1 puff into the lungs every 6 (six) hours as needed for wheezing or shortness of breath.   furosemide 40 MG tablet Commonly known as: LASIX Take 1 tablet (40 mg total) by mouth daily.   losartan 100 MG tablet Commonly known as: COZAAR Take 0.5 tablets (50 mg total) by mouth daily.   metFORMIN 500 MG tablet Commonly known as: GLUCOPHAGE TAKE 2 TABLETS BY MOUTH EVERY MORNING AND 1 TABLET BY MOUTH EVERY EVENING   mometasone-formoterol 200-5 MCG/ACT Aero Commonly known as: Dulera Inhale 2 puffs into the lungs 2 (two) times daily.   PRESERVISION AREDS PO Take 2 tablets by mouth daily.           Objective:   Physical Exam BP (!) 167/81 (BP Location: Left Arm, Patient Position: Sitting, Cuff Size: Small)   Pulse 62   Temp (!) 97 F (36.1 C) (Temporal)   Resp 16   Ht 5\' 10"  (1.778 m)   Wt 145 lb 4 oz (65.9 kg)   SpO2 98%   BMI 20.84 kg/m   General:   Well developed, NAD, BMI noted. HEENT:  Normocephalic . Face symmetric, atraumatic Lungs:  Decreased breath sounds Normal respiratory effort, no intercostal retractions, no accessory muscle use. Heart: RRR,  no murmur.  No pretibial edema bilaterally  Diabetic foot exam: No edema, good pedal pulses, nails are long, pinprick examination normal Neurologic:  alert & oriented X3.  Speech normal, gait appropriate for age and unassisted Psych--  Cognition and judgment  appear intact.  Cooperative with normal attention span and concentration.  Behavior appropriate. No anxious or depressed appearing.      Assessment     Assessment   DM HTN Hyperlipidemia COPD, Respiratory failure chronic HR CT chest   >>> 01/2016 , saw Dr Melvyn Novas; no f/u needed Urology: -Renal mass 9- 2015,  sees urology  #H/o Prostate cancer  2008 XRT H/o Cholestatic hepatitis- s/p ERCP 2014,  cholecystectomy 2015 Syncope 2010 , 2019.  Negative cardiac evaluation as of 09-2018. Carotid artery disease: 06/2018:  R ICA  1-39% L ICA are consistent with a 40-59% stenosis;  [place] 06/2019, next [place] prn   PLAN: DM: Continue metformin, feet exam negative, declined referral to a podiatrist but will take care of his nails (they are too long).  Check A1c HTN: On carvedilol, Lasix, losartan.  Today BP is a slightly elevated, reports that BPs at home varies.  Recommend to continue checking, see AVS, check a BMP Carotid artery disease: U/S 06-2019 improved, no follow-up. COPD: Good compliance with medications, he uses the nebulizer at bedtime most nights, not because increased cough or respiratory symptoms he simply "feels better" that way. No change. Preventive care, flu shot today RTC 4 months

## 2019-08-02 NOTE — Progress Notes (Signed)
Pre visit review using our clinic review tool, if applicable. No additional management support is needed unless otherwise documented below in the visit note. 

## 2019-08-02 NOTE — Patient Instructions (Addendum)
Please schedule Medicare Wellness with Glenard Haring.   Per our records you are due for an eye exam. Please contact your eye doctor to schedule an appointment. Please have them send copies of your office visit notes to [place]. Our fax number is [phone].   GO TO THE LAB : Get the blood work     GO TO THE FRONT DESK Schedule your next appointment   for checkup in 4 to 5 months  Check the  blood pressure 2 or 3 times a week  BP GOAL is between 110/65 and  135/85. If it is consistently higher or lower, let me know

## 2019-08-03 NOTE — Assessment & Plan Note (Signed)
DM: Continue metformin, feet exam negative, declined referral to a podiatrist but will take care of his nails (they are too long).  Check A1c HTN: On carvedilol, Lasix, losartan.  Today BP is a slightly elevated, reports that BPs at home varies.  Recommend to continue checking, see AVS, check a BMP Carotid artery disease: U/S 06-2019 improved, no follow-up. COPD: Good compliance with medications, he uses the nebulizer at bedtime most nights, not because increased cough or respiratory symptoms he simply "feels better" that way. No change. Preventive care, flu shot today RTC 4 months

## 2019-08-19 ENCOUNTER — Other Ambulatory Visit: Payer: Self-pay | Admitting: Internal Medicine

## 2019-09-05 ENCOUNTER — Encounter (INDEPENDENT_AMBULATORY_CARE_PROVIDER_SITE_OTHER): Payer: Medicare Other | Admitting: Ophthalmology

## 2019-09-05 DIAGNOSIS — H43813 Vitreous degeneration, bilateral: Secondary | ICD-10-CM

## 2019-09-05 DIAGNOSIS — H353231 Exudative age-related macular degeneration, bilateral, with active choroidal neovascularization: Secondary | ICD-10-CM | POA: Diagnosis not present

## 2019-09-05 DIAGNOSIS — H35033 Hypertensive retinopathy, bilateral: Secondary | ICD-10-CM

## 2019-09-05 DIAGNOSIS — I10 Essential (primary) hypertension: Secondary | ICD-10-CM | POA: Diagnosis not present

## 2019-09-06 DIAGNOSIS — H04123 Dry eye syndrome of bilateral lacrimal glands: Secondary | ICD-10-CM | POA: Diagnosis not present

## 2019-09-06 DIAGNOSIS — H02831 Dermatochalasis of right upper eyelid: Secondary | ICD-10-CM | POA: Diagnosis not present

## 2019-09-06 DIAGNOSIS — Z961 Presence of intraocular lens: Secondary | ICD-10-CM | POA: Diagnosis not present

## 2019-09-06 DIAGNOSIS — H35371 Puckering of macula, right eye: Secondary | ICD-10-CM | POA: Diagnosis not present

## 2019-09-06 DIAGNOSIS — H353232 Exudative age-related macular degeneration, bilateral, with inactive choroidal neovascularization: Secondary | ICD-10-CM | POA: Diagnosis not present

## 2019-09-06 DIAGNOSIS — H02834 Dermatochalasis of left upper eyelid: Secondary | ICD-10-CM | POA: Diagnosis not present

## 2019-09-06 DIAGNOSIS — E119 Type 2 diabetes mellitus without complications: Secondary | ICD-10-CM | POA: Diagnosis not present

## 2019-09-06 LAB — HM DIABETES EYE EXAM

## 2019-09-14 ENCOUNTER — Other Ambulatory Visit: Payer: Self-pay | Admitting: Internal Medicine

## 2019-09-19 ENCOUNTER — Encounter: Payer: Self-pay | Admitting: Internal Medicine

## 2019-09-21 ENCOUNTER — Other Ambulatory Visit: Payer: Self-pay

## 2019-10-18 ENCOUNTER — Other Ambulatory Visit: Payer: Self-pay | Admitting: Internal Medicine

## 2019-12-01 ENCOUNTER — Telehealth: Payer: Self-pay | Admitting: Internal Medicine

## 2020-01-04 ENCOUNTER — Encounter: Payer: Self-pay | Admitting: Internal Medicine

## 2020-01-04 ENCOUNTER — Other Ambulatory Visit: Payer: Self-pay

## 2020-01-04 ENCOUNTER — Ambulatory Visit (INDEPENDENT_AMBULATORY_CARE_PROVIDER_SITE_OTHER): Payer: Medicare Other | Admitting: Internal Medicine

## 2020-01-04 VITALS — BP 167/70 | HR 62 | Temp 96.5°F | Resp 18 | Ht 70.0 in | Wt 144.1 lb

## 2020-01-04 DIAGNOSIS — D649 Anemia, unspecified: Secondary | ICD-10-CM

## 2020-01-04 DIAGNOSIS — I10 Essential (primary) hypertension: Secondary | ICD-10-CM | POA: Diagnosis not present

## 2020-01-04 DIAGNOSIS — E119 Type 2 diabetes mellitus without complications: Secondary | ICD-10-CM

## 2020-01-04 DIAGNOSIS — Z0000 Encounter for general adult medical examination without abnormal findings: Secondary | ICD-10-CM

## 2020-01-04 DIAGNOSIS — Z09 Encounter for follow-up examination after completed treatment for conditions other than malignant neoplasm: Secondary | ICD-10-CM | POA: Diagnosis not present

## 2020-01-04 LAB — COMPREHENSIVE METABOLIC PANEL
ALT: 8 U/L (ref 0–53)
AST: 12 U/L (ref 0–37)
Albumin: 4.1 g/dL (ref 3.5–5.2)
Alkaline Phosphatase: 70 U/L (ref 39–117)
BUN: 21 mg/dL (ref 6–23)
CO2: 31 mEq/L (ref 19–32)
Calcium: 9 mg/dL (ref 8.4–10.5)
Chloride: 103 mEq/L (ref 96–112)
Creatinine, Ser: 1.38 mg/dL (ref 0.40–1.50)
GFR: 48.92 mL/min — ABNORMAL LOW (ref >60.00)
Glucose, Bld: 109 mg/dL — ABNORMAL HIGH (ref 70–99)
Potassium: 4.2 mEq/L (ref 3.5–5.1)
Sodium: 140 mEq/L (ref 135–145)
Total Bilirubin: 0.5 mg/dL (ref 0.2–1.2)
Total Protein: 7 g/dL (ref 6.0–8.3)

## 2020-01-04 NOTE — Progress Notes (Signed)
Pre visit review using our clinic review tool, if applicable. No additional management support is needed unless otherwise documented below in the visit note. 

## 2020-01-04 NOTE — Progress Notes (Addendum)
Subjective:    Patient ID: Jim [name], male    DOB: 03/13/1932, 86 y.o.   MRN: [id number]  DOS:  01/04/2020 Type of visit - description: Routine visit Since the last office visit he is doing well. Today we address hypertension, anemia, diabetes & high cholesterol.  He   he walks regularly, he walks in a 2 acre property, and the end he feels tired but denies DOE or chest pain. Minimal cough, mostly in the morning with sputum production but the rest of the day he is okay.  Ambulatory BPs reviewed    Review of Systems Denies dysuria, gross hematuria or difficulty urinating. No nausea, vomiting, abdominal pain Past Medical History:  Diagnosis Date  . Cholestatic hepatitis 2014  . COPD (chronic obstructive pulmonary disease) ([place])   . ED (erectile dysfunction)   . Essential hypertension, benign   . Hyperlipidemia   . Personal history of colonic polyps   . Prostate cancer ([place]) 2008   s/p XRT  . Renal mass 08/12/2014  . Syncope 2010   Evaluated by Dr. Caryl Comes - normal LVEF, possibly neurally mediated  . Type 2 diabetes mellitus ([place]) 10/2009    Past Surgical History:  Procedure Laterality Date  . Carotid Dopplers  06/2018   R ICA 1-39%.  LICA 123456.  Normal subclavian and vertebral arteries. -->  Recommend 75-month follow-up  . CATARACT EXTRACTION Bilateral   . CHOLECYSTECTOMY N/A 08/22/2014   Procedure: LAPAROSCOPIC CHOLECYSTECTOMY WITH INTRAOPERATIVE CHOLANGIOGRAM;  Surgeon: Gayland Curry, MD;  [facility];  Service: General;  Laterality: N/A;  . ERCP N/A 08/16/2014   Procedure: ENDOSCOPIC RETROGRADE CHOLANGIOPANCREATOGRAPHY (ERCP);  Surgeon: Milus Banister, MD;  [facility];  Service: Endoscopy;  Laterality: N/A;  . EYE SURGERY Left 07/17/2014  . TRANSTHORACIC ECHOCARDIOGRAM  06/2018   EF 50-55%. No RWMA. Gr 1 DD.  Aortic Sclerosis w/o Stenosis.  Mild TR -correlates with moderately elevated PA pressures (47 mmHg)        Objective:   Physical Exam BP (!)  167/70 (BP Location: Left Arm, Patient Position: Sitting, Cuff Size: Small)   Pulse 62   Temp (!) 96.5 F (35.8 C) (Temporal)   Resp 18   Ht 5\' 10"  (1.778 m)   Wt 144 lb 2 oz (65.4 kg)   SpO2 98%   BMI 20.68 kg/m  General:   Well developed, NAD, elderly gentleman, slightly underweight appearing HEENT:  Normocephalic . Face symmetric, atraumatic Lungs:  Decreased breath sounds, few rhonchi with forced cough Normal respiratory effort, no intercostal retractions, no accessory muscle use. Heart: RRR,  no murmur.  no pretibial edema bilaterally  Abdomen:  Not distended, soft, non-tender. No rebound or rigidity.   Skin: Not pale. Not jaundice Neurologic:  alert & oriented X3.  Speech normal, gait appropriate for age and unassisted Psych--  Cognition and judgment appear intact.  Cooperative with normal attention span and concentration.  Behavior appropriate. No anxious or depressed appearing.     Assessment      Assessment   DM HTN Hyperlipidemia COPD, Respiratory failure chronic: - ONO lowest O2 sats 67%.   36 minutes under 88% 2012: - ONO 12/2012 on RA: Lowest O2 sat 69%, HR CT chest   >>> 01/2016 , saw Dr Melvyn Novas; no f/u needed Urology: -Renal mass 9- 2015,  sees urology  #H/o Prostate cancer 2008 XRT H/o Cholestatic hepatitis- s/p ERCP 2014,  cholecystectomy 2015 Syncope 2010 , 2019.  Negative cardiac evaluation as of 09-2018. Carotid artery  disease: 06/2018:  R ICA  1-39% L ICA are consistent with a 40-59% stenosis;  [place] 06/2019, next [place] prn   PLAN: DM: Last A1c excellent, continue Metformin.  Checking kidney function. HTN: On carvedilol, Lasix, losartan.  Check a CMP.  BP today slightly elevated, has checked 4 times at home in the last 2 months: 130s/70s.  No change. Anemia: Per chart review, anemia is mild, GI ROS negative.  For completeness we will get CBC, peripheral smear, reticulocyte count TIBC panel.  Suspect anemia due to chronic disease Prostate cancer, renal  mass: Has an appointment to see urology few days Addendum: COPD/chronic respiratory failure:  This was well documented with a ONO in 2012 and  12/2012 on RA: Lowest O2 sat 69%, Using mostly Combivent.  Recommend to use Dulera twice a day and  Combivent as needed (rescue inhaler). Patient benefits from O2 supplements for his COPD and should continue using 2 LPM with sleep. Preventive care reviewed RTC 4 months   This visit occurred during the SARS-CoV-2 public health emergency.  Safety protocols were in place, including screening questions prior to the visit, additional usage of staff PPE, and extensive cleaning of exam room while observing appropriate contact time as indicated for disinfecting solutions.

## 2020-01-04 NOTE — Patient Instructions (Addendum)
Please schedule Medicare Wellness with Glenard Haring.   GO TO THE LAB : Get the blood work     [place] back for a checkup in 4 months, please make an appointment  Continue checking your blood pressure, twice a  month if possible BP GOAL is between 110/65 and  135/85. If it is consistently higher or lower, let me know

## 2020-01-04 NOTE — Assessment & Plan Note (Addendum)
DM: Last A1c excellent, continue Metformin.  Checking kidney function. HTN: On carvedilol, Lasix, losartan.  Check a CMP.  BP today slightly elevated, has checked 4 times at home in the last 2 months: 130s/70s.  No change. Anemia: Per chart review, anemia is mild, GI ROS negative.  For completeness we will get CBC, peripheral smear, reticulocyte count TIBC panel.  Suspect anemia due to chronic disease Prostate cancer, renal mass: Has an appointment to see urology few days Addendum: COPD/chronic respiratory failure:  This was well documented with a ONO in 2012 and  12/2012 on RA: Lowest O2 sat 69%, Using mostly Combivent.  Recommend to use Dulera twice a day and  Combivent as needed (rescue inhaler). Patient benefits from O2 supplements for his COPD and should continue using 2 LPM with sleep. Preventive care reviewed RTC 4 months

## 2020-01-04 NOTE — Assessment & Plan Note (Signed)
--  Td 19 -  pneumonia shot  2010, booster 03-2017  - prevnar-- 2010 - zostavax   ~ 2012 - had a flu shot -Discussed shingrex before  --CCS -- no further cscopes, see previous entries  --h/o prostate ca, sees urology  --Not a candidate for lung cancer screening

## 2020-01-05 LAB — RETICULOCYTES
ABS Retic: 47190 cells/uL (ref 25000–9000)
Retic Ct Pct: 1.3 %

## 2020-01-05 LAB — CBC (INCLUDES DIFF/PLT) WITH PATHOLOGIST REVIEW
Absolute Monocytes: 671 cells/uL (ref 200–950)
Basophils Absolute: 69 cells/uL (ref 0–200)
Basophils Relative: 0.8 %
Eosinophils Absolute: 473 cells/uL (ref 15–500)
Eosinophils Relative: 5.5 %
HCT: 34.1 % — ABNORMAL LOW (ref 38.5–50.0)
Hemoglobin: 12.1 g/dL — ABNORMAL LOW (ref 13.2–17.1)
Lymphs Abs: 1221 cells/uL (ref 850–3900)
MCH: 33.3 pg — ABNORMAL HIGH (ref 27.0–33.0)
MCHC: 35.5 g/dL (ref 32.0–36.0)
MCV: 93.9 fL (ref 80.0–100.0)
MPV: 10.4 fL (ref 7.5–12.5)
Monocytes Relative: 7.8 %
Neutro Abs: 6166 cells/uL (ref 1500–7800)
Neutrophils Relative %: 71.7 %
Platelets: 243 10*3/uL (ref 140–400)
RBC: 3.63 10*6/uL — ABNORMAL LOW (ref 4.20–5.80)
RDW: 12.2 % (ref 11.0–15.0)
Total Lymphocyte: 14.2 %
WBC: 8.6 10*3/uL (ref 3.8–10.8)

## 2020-01-05 LAB — IRON,TIBC AND FERRITIN PANEL
%SAT: 31 % (calc) (ref 20–48)
Ferritin: 235 ng/mL (ref 24–380)
Iron: 76 ug/dL (ref 50–180)
TIBC: 242 mcg/dL (calc) — ABNORMAL LOW (ref 250–425)

## 2020-01-09 ENCOUNTER — Encounter (INDEPENDENT_AMBULATORY_CARE_PROVIDER_SITE_OTHER): Payer: Medicare Other | Admitting: Ophthalmology

## 2020-01-10 DIAGNOSIS — Z8546 Personal history of malignant neoplasm of prostate: Secondary | ICD-10-CM | POA: Diagnosis not present

## 2020-01-10 DIAGNOSIS — D49512 Neoplasm of unspecified behavior of left kidney: Secondary | ICD-10-CM | POA: Diagnosis not present

## 2020-01-16 ENCOUNTER — Other Ambulatory Visit: Payer: Self-pay | Admitting: Internal Medicine

## 2020-01-23 ENCOUNTER — Telehealth: Payer: Self-pay | Admitting: Internal Medicine

## 2020-01-23 NOTE — Telephone Encounter (Signed)
The patient uses oxygen at night for many years, [employer] -the oxygen provider - requested a number of documents, we faxed my last note as well as to previous oximetry reports. They say they will review the documents and then decide if his oxygen will be renewed.  Asked them to call me if they don't.

## 2020-02-07 ENCOUNTER — Telehealth: Payer: Self-pay

## 2020-02-07 NOTE — Telephone Encounter (Signed)
Received Medicare re-certification from [place]. Form completed and faxed back to [phone]. Form sent for scanning. Received fax confirmation.

## 2020-02-08 ENCOUNTER — Telehealth: Payer: Self-pay | Admitting: Internal Medicine

## 2020-02-08 NOTE — Telephone Encounter (Signed)
Notes have been faxed multiple times- however will send again.

## 2020-02-08 NOTE — Telephone Encounter (Signed)
Requesting for re- certification for oxygen for medical nesscaite ,per Santiago Glad she needs office visit documentation sent to her to go along with forms    Fax [phone]

## 2020-02-09 ENCOUNTER — Telehealth: Payer: Self-pay | Admitting: Internal Medicine

## 2020-02-09 ENCOUNTER — Other Ambulatory Visit: Payer: Self-pay | Admitting: Internal Medicine

## 2020-02-09 DIAGNOSIS — J9611 Chronic respiratory failure with hypoxia: Secondary | ICD-10-CM

## 2020-02-09 DIAGNOSIS — J449 Chronic obstructive pulmonary disease, unspecified: Secondary | ICD-10-CM

## 2020-02-09 NOTE — Telephone Encounter (Signed)
Spoke w/ Larkin Ina at [facility]- they are able to perform overnight oxiemetry testing. Order to be faxed at [phone].   I called and informed Pt of reasoning for new test. He agrees to have completed- he has also moved since last OV visit. Address updated.

## 2020-02-09 NOTE — Addendum Note (Signed)
Addended byDamita Dunnings D on: 02/09/2020 02:15 PM   Modules accepted: Orders

## 2020-02-09 NOTE — Telephone Encounter (Signed)
Caller:Karen, [place]  Telephone # [phone] Fax : [phone]   Requesting that she receives original testing for oxygen notes to add to patients chart for reverification for supplies.  Also states that she needs additional notes surrounding November 12 ,2020.

## 2020-02-09 NOTE — Telephone Encounter (Signed)
27 pages of records faxed yesterday w/ fax confirmation- however will send again.

## 2020-02-09 NOTE — Telephone Encounter (Signed)
Spoke w/ Santiago Glad at [facility]- she received the OV notes from 2012-2013 however nothing to support the need for nighttime oxygen. Informed that all older notes in old [employer] system which we no longer have access to. She recommends new overnight oximeter testing and appt documenting need for test/night time oxygen once test is completed. Informed her I'd let PCP know an see if he'd be willing to order or to send back to pulmonary for testing- last ov w/ pulm in 2017.

## 2020-02-09 NOTE — Telephone Encounter (Signed)
Reorder overnight oximeter without oxygen supplement

## 2020-02-15 ENCOUNTER — Other Ambulatory Visit: Payer: Self-pay | Admitting: Internal Medicine

## 2020-02-20 ENCOUNTER — Other Ambulatory Visit: Payer: Self-pay

## 2020-02-20 MED ORDER — DULERA 200-5 MCG/ACT IN AERO: 2.0000 | INHALATION_SPRAY | Freq: Two times a day (BID) | RESPIRATORY_TRACT | 12 refills | Status: DC

## 2020-02-22 NOTE — Telephone Encounter (Signed)
Order refaxed today- requesting for them to fax [place] results as soon as they are available Pt is awaiting for re-cert on his oxygen.

## 2020-02-29 ENCOUNTER — Telehealth: Payer: Self-pay | Admitting: Internal Medicine

## 2020-02-29 MED ORDER — DULERA 200-5 MCG/ACT IN AERO: 2.0000 | INHALATION_SPRAY | Freq: Two times a day (BID) | RESPIRATORY_TRACT | 12 refills | Status: AC

## 2020-02-29 MED ORDER — COMBIVENT RESPIMAT 20-100 MCG/ACT IN AERS: 1.0000 | INHALATION_SPRAY | Freq: Four times a day (QID) | RESPIRATORY_TRACT | 5 refills | Status: AC | PRN

## 2020-02-29 NOTE — Telephone Encounter (Signed)
Rxs sent

## 2020-02-29 NOTE — Telephone Encounter (Signed)
Pt came in office stating is needing refill on Ipratropium-Albuterol (COMBIVENT RESPIMAT) 20-100 MCG/ACT AERS respimat and also refill on mometasone-formoterol (DULERA) 200-5 MCG/ACT AERO sent to:   [facility] [id number] [address]  [address] [place] [address]  Phone:  [phone] Fax:  [phone]. Please advise.

## 2020-03-08 DIAGNOSIS — J449 Chronic obstructive pulmonary disease, unspecified: Secondary | ICD-10-CM | POA: Diagnosis not present

## 2020-03-08 DIAGNOSIS — R0902 Hypoxemia: Secondary | ICD-10-CM | POA: Diagnosis not present

## 2020-03-12 NOTE — Telephone Encounter (Signed)
Results received. PCP out of office until Wednesday April 21.

## 2020-03-13 NOTE — Telephone Encounter (Signed)
Sherri- can you contact Pt to schedule appt to discuss results of overnight oximetry (virtual okay)- we just need documentation he had visit to recert his oxygen for Medicare. Okay to schedule at his earliest convenience. Thank you.

## 2020-03-16 ENCOUNTER — Other Ambulatory Visit: Payer: Self-pay | Admitting: Internal Medicine

## 2020-03-16 ENCOUNTER — Telehealth: Payer: Self-pay

## 2020-03-16 ENCOUNTER — Ambulatory Visit (INDEPENDENT_AMBULATORY_CARE_PROVIDER_SITE_OTHER): Payer: Medicare Other | Admitting: Internal Medicine

## 2020-03-16 ENCOUNTER — Encounter: Payer: Self-pay | Admitting: Internal Medicine

## 2020-03-16 VITALS — BP 157/75 | Ht 70.0 in | Wt 144.0 lb

## 2020-03-16 DIAGNOSIS — J9611 Chronic respiratory failure with hypoxia: Secondary | ICD-10-CM

## 2020-03-16 NOTE — Telephone Encounter (Signed)
Received fax confirmation

## 2020-03-16 NOTE — Progress Notes (Signed)
Subjective:    Patient ID: Jim [name], male    DOB: 1932/01/25, 86 y.o.   MRN: [id number]  DOS:  03/16/2020 Type of visit - description: Virtual Visit via Telephone  Attempted  to make this a video visit, due to technical difficulties from the patient side it was not possible  thus we proceeded with a Virtual Visit via Telephone    I connected with above mentioned patient  by telephone and verified that I am speaking with the correct person using two identifiers.  THIS ENCOUNTER IS A VIRTUAL VISIT DUE TO COVID-19 - PATIENT WAS NOT SEEN IN THE OFFICE. PATIENT HAS CONSENTED TO VIRTUAL VISIT / TELEMEDICINE VISIT   Location of patient: home  Location of provider: office  I discussed the limitations, risks, security and privacy concerns of performing an evaluation and management service by telephone and the availability of in person appointments. I also discussed with the patient that there may be a patient responsible charge related to this service. The patient expressed understanding and agreed to proceed.  Acute Today is for the evaluation of his recent ONO. It showed that he was below 88% for him more than 18 minutes. At this time he is doing well.    Review of Systems No recent problems with fever chills Hardly ever coughs No edema   Past Medical History:  Diagnosis Date  . Cholestatic hepatitis 2014  . COPD (chronic obstructive pulmonary disease) ([place])   . ED (erectile dysfunction)   . Essential hypertension, benign   . Hyperlipidemia   . Personal history of colonic polyps   . Prostate cancer ([place]) 2008   s/p XRT  . Renal mass 08/12/2014  . Syncope 2010   Evaluated by Dr. Caryl Comes - normal LVEF, possibly neurally mediated  . Type 2 diabetes mellitus ([place]) 10/2009    Past Surgical History:  Procedure Laterality Date  . Carotid Dopplers  06/2018   R ICA 1-39%.  LICA 123456.  Normal subclavian and vertebral arteries. -->  Recommend 64-month follow-up  . CATARACT  EXTRACTION Bilateral   . CHOLECYSTECTOMY N/A 08/22/2014   Procedure: LAPAROSCOPIC CHOLECYSTECTOMY WITH INTRAOPERATIVE CHOLANGIOGRAM;  Surgeon: Gayland Curry, MD;  [facility];  Service: General;  Laterality: N/A;  . ERCP N/A 08/16/2014   Procedure: ENDOSCOPIC RETROGRADE CHOLANGIOPANCREATOGRAPHY (ERCP);  Surgeon: Milus Banister, MD;  [facility];  Service: Endoscopy;  Laterality: N/A;  . EYE SURGERY Left 07/17/2014  . TRANSTHORACIC ECHOCARDIOGRAM  06/2018   EF 50-55%. No RWMA. Gr 1 DD.  Aortic Sclerosis w/o Stenosis.  Mild TR -correlates with moderately elevated PA pressures (47 mmHg)    Allergies as of 03/16/2020      Reactions   Amlodipine Besylate    REACTION: swelling   Cardura [doxazosin Mesylate] Itching   Clonidine Derivatives Itching   Fluticasone-salmeterol Hives, Itching   Nifedipine    Swelling, rash, itching      Medication List       Accurate as of March 16, 2020 11:59 PM. If you have any questions, ask your nurse or doctor.        albuterol (2.5 MG/3ML) 0.083% nebulizer solution Commonly known as: PROVENTIL Take 3 mLs (2.5 mg total) by nebulization 4 (four) times daily as needed. What changed: reasons to take this   aspirin 81 MG tablet Take 81 mg by mouth daily.   atorvastatin 20 MG tablet Commonly known as: LIPITOR Take 0.5 tablets (10 mg total) by mouth at bedtime.   carvedilol  25 MG tablet Commonly known as: COREG Take 1 tablet (25 mg total) by mouth 2 (two) times daily with a meal.   Combivent Respimat 20-100 MCG/ACT Aers respimat Generic drug: Ipratropium-Albuterol Inhale 1 puff into the lungs every 6 (six) hours as needed for wheezing or shortness of breath.   Dulera 200-5 MCG/ACT Aero Generic drug: mometasone-formoterol Inhale 2 puffs into the lungs 2 (two) times daily.   furosemide 40 MG tablet Commonly known as: LASIX Take 1 tablet (40 mg total) by mouth daily.   losartan 100 MG tablet Commonly known as: COZAAR Take 0.5 tablets  (50 mg total) by mouth daily.   metFORMIN 500 MG tablet Commonly known as: GLUCOPHAGE TAKE 2 TABLETS BY MOUTH EVERY MORNING AND 1 EVERY EVENING   PRESERVISION AREDS PO Take 2 tablets by mouth daily.          Objective:   Physical Exam BP (!) 157/75   Ht 5\' 10"  (1.778 m)   Wt 144 lb (65.3 kg)   BMI 20.66 kg/m  This is a telephone assessment, he is alert oriented x3, does not seem to be in respiratory distress, speaking in complete sentences        Assessment    Assessment   DM HTN Hyperlipidemia COPD, Respiratory failure chronic: - ONO 2012:  lowest O2 sats 67%.   36 minutes under 88%   - ONO 12/2012 on RA: Lowest O2 sat 69%, HR CT chest   >>> 01/2016 , saw Dr Melvyn Novas; no f/u needed Urology: -Renal mass 9- 2015,  sees urology  #H/o Prostate cancer 2008 XRT H/o Cholestatic hepatitis- s/p ERCP 2014,  cholecystectomy 2015 Syncope 2010 , 2019.  Negative cardiac evaluation as of 09-2018. Carotid artery disease: 06/2018:  R ICA  1-39% L ICA are consistent with a 40-59% stenosis;  [place] 06/2019, next [place] prn   PLAN: COPD, chronic respiratory failure. He has well-documented nocturnal hypoxia. Had a ONO March 09, 2020, he was below 88% for more than 18 minutes. He qualify for ongoing nocturnal oxygen supplementation. This test was done while he was at his baseline and with no acute problems. RTC scheduled for June 2021   I discussed the assessment and treatment plan with the patient. The patient was provided an opportunity to ask questions and all were answered. The patient agreed with the plan and demonstrated an understanding of the instructions.   The patient was advised to call back or seek an in-person evaluation if the symptoms worsen or if the condition fails to improve as anticipated.  I provided 12  minutes of non-face-to-face time during this encounter.  [name], MD

## 2020-03-16 NOTE — Progress Notes (Signed)
Pre visit review using our clinic review tool, if applicable. No additional management support is needed unless otherwise documented below in the visit note. 

## 2020-03-16 NOTE — Telephone Encounter (Signed)
Form received, completed w/ new overnight oximetry results from 03/08/2020 and faxed back to [place] at [phone]. Form sent for scanning. Overnight oxygen results also sent for scanning.

## 2020-03-18 NOTE — Assessment & Plan Note (Signed)
COPD, chronic respiratory failure. He has well-documented nocturnal hypoxia. Had a ONO March 09, 2020, he was below 88% for more than 18 minutes. He qualify for ongoing nocturnal oxygen supplementation. This test was done while he was at his baseline and with no acute problems. RTC scheduled for June 2021

## 2020-04-15 ENCOUNTER — Other Ambulatory Visit: Payer: Self-pay | Admitting: Internal Medicine

## 2020-04-19 ENCOUNTER — Telehealth: Payer: Self-pay

## 2020-04-24 ENCOUNTER — Telehealth: Payer: Self-pay | Admitting: Internal Medicine

## 2020-04-24 NOTE — Telephone Encounter (Signed)
I reach out to the number provided, a different paramedic responded, I will be getting the death certificate.

## 2020-04-24 NOTE — Telephone Encounter (Signed)
Funeral dropped off death Certificate . Will inform CMA

## 2020-04-24 NOTE — Telephone Encounter (Signed)
Received after hours call from [name]- reporting death. Was reported to Dr. Derrel Nip this morning at 2:44am. Jim [name] can be reached at [phone].

## 2020-04-24 NOTE — Telephone Encounter (Signed)
Death certificate reviewed and signed. The on-call physician sent me the following information: Hope you are well.  \Wanted to let you know that I received a call from [facility]/EMS around 2:15 am last night. Patient had been having trouble with his 02 tank earlier in the evening,  refused transport by EMS. Suffered cardiac arrest at home.

## 2020-04-24 NOTE — Telephone Encounter (Signed)
Received Death Certificate from [employer]. Forwarded to PCP.

## 2020-04-24 NOTE — Telephone Encounter (Signed)
[employer] informed that Death Certificate ready for pick up at front desk. Copy of form sent for scanning.

## 2020-04-24 NOTE — Telephone Encounter (Signed)
See other Telephone note.

## 2020-04-24 DEATH — deceased

## 2020-05-03 ENCOUNTER — Ambulatory Visit: Payer: Medicare Other | Admitting: Internal Medicine
